# Patient Record
Sex: Male | Born: 1945 | Race: White | Hispanic: No | Smoking: Former smoker
Health system: Southern US, Community
[De-identification: ages and names within clinical notes are randomized; demographics above are authoritative.]

## PROBLEM LIST (undated history)

## (undated) DIAGNOSIS — I708 Atherosclerosis of other arteries: Secondary | ICD-10-CM

## (undated) DIAGNOSIS — E785 Hyperlipidemia, unspecified: Secondary | ICD-10-CM

## (undated) DIAGNOSIS — F329 Major depressive disorder, single episode, unspecified: Secondary | ICD-10-CM

## (undated) DIAGNOSIS — F419 Anxiety disorder, unspecified: Secondary | ICD-10-CM

## (undated) DIAGNOSIS — F32A Depression, unspecified: Secondary | ICD-10-CM

## (undated) DIAGNOSIS — I2510 Atherosclerotic heart disease of native coronary artery without angina pectoris: Secondary | ICD-10-CM

## (undated) DIAGNOSIS — R011 Cardiac murmur, unspecified: Secondary | ICD-10-CM

## (undated) DIAGNOSIS — Z9289 Personal history of other medical treatment: Secondary | ICD-10-CM

## (undated) DIAGNOSIS — D473 Essential (hemorrhagic) thrombocythemia: Secondary | ICD-10-CM

## (undated) DIAGNOSIS — R911 Solitary pulmonary nodule: Secondary | ICD-10-CM

## (undated) DIAGNOSIS — N138 Other obstructive and reflux uropathy: Secondary | ICD-10-CM

## (undated) DIAGNOSIS — K219 Gastro-esophageal reflux disease without esophagitis: Secondary | ICD-10-CM

## (undated) DIAGNOSIS — E119 Type 2 diabetes mellitus without complications: Secondary | ICD-10-CM

## (undated) DIAGNOSIS — D649 Anemia, unspecified: Secondary | ICD-10-CM

## (undated) DIAGNOSIS — I700 Atherosclerosis of aorta: Secondary | ICD-10-CM

## (undated) DIAGNOSIS — M5030 Other cervical disc degeneration, unspecified cervical region: Secondary | ICD-10-CM

## (undated) DIAGNOSIS — I10 Essential (primary) hypertension: Secondary | ICD-10-CM

## (undated) DIAGNOSIS — N401 Enlarged prostate with lower urinary tract symptoms: Secondary | ICD-10-CM

## (undated) DIAGNOSIS — M47812 Spondylosis without myelopathy or radiculopathy, cervical region: Secondary | ICD-10-CM

## (undated) DIAGNOSIS — I4819 Other persistent atrial fibrillation: Secondary | ICD-10-CM

## (undated) HISTORY — DX: Other cervical disc degeneration, unspecified cervical region: M50.30

## (undated) HISTORY — DX: Gastro-esophageal reflux disease without esophagitis: K21.9

## (undated) HISTORY — DX: Other obstructive and reflux uropathy: N13.8

## (undated) HISTORY — DX: Depression, unspecified: F32.A

## (undated) HISTORY — DX: Essential (primary) hypertension: I10

## (undated) HISTORY — DX: Benign prostatic hyperplasia with lower urinary tract symptoms: N40.1

## (undated) HISTORY — DX: Cardiac murmur, unspecified: R01.1

## (undated) HISTORY — DX: Personal history of other medical treatment: Z92.89

## (undated) HISTORY — DX: Solitary pulmonary nodule: R91.1

## (undated) HISTORY — DX: Spondylosis without myelopathy or radiculopathy, cervical region: M47.812

## (undated) HISTORY — DX: Other persistent atrial fibrillation: I48.19

## (undated) HISTORY — DX: Major depressive disorder, single episode, unspecified: F32.9

## (undated) HISTORY — PX: EYE SURGERY: SHX253

## (undated) HISTORY — DX: Atherosclerosis of other arteries: I70.8

## (undated) HISTORY — PX: RETINAL LASER PROCEDURE: SHX2339

## (undated) HISTORY — DX: Type 2 diabetes mellitus without complications: E11.9

## (undated) HISTORY — DX: Anxiety disorder, unspecified: F41.9

## (undated) HISTORY — DX: Hyperlipidemia, unspecified: E78.5

## (undated) HISTORY — PX: CATARACT EXTRACTION: SUR2

## (undated) HISTORY — DX: Atherosclerosis of aorta: I70.0

## (undated) HISTORY — DX: Atherosclerotic heart disease of native coronary artery without angina pectoris: I25.10

## (undated) HISTORY — DX: Essential (hemorrhagic) thrombocythemia: D47.3

## (undated) HISTORY — DX: Anemia, unspecified: D64.9

---

## 2005-09-05 ENCOUNTER — Ambulatory Visit: Payer: Self-pay | Admitting: Internal Medicine

## 2006-08-31 ENCOUNTER — Ambulatory Visit: Payer: Self-pay | Admitting: Family Medicine

## 2007-01-05 ENCOUNTER — Ambulatory Visit: Payer: Self-pay | Admitting: Family Medicine

## 2007-05-09 ENCOUNTER — Emergency Department: Payer: Self-pay | Admitting: Emergency Medicine

## 2008-10-20 ENCOUNTER — Ambulatory Visit: Payer: Self-pay | Admitting: Unknown Physician Specialty

## 2010-07-30 ENCOUNTER — Ambulatory Visit: Payer: Self-pay | Admitting: Otolaryngology

## 2010-10-08 ENCOUNTER — Ambulatory Visit: Payer: Self-pay | Admitting: Neurology

## 2011-05-13 ENCOUNTER — Ambulatory Visit: Payer: Self-pay | Admitting: Family Medicine

## 2011-06-02 ENCOUNTER — Ambulatory Visit: Payer: Self-pay | Admitting: Family Medicine

## 2011-06-02 ENCOUNTER — Ambulatory Visit: Payer: Self-pay | Admitting: Oncology

## 2011-06-02 LAB — IRON AND TIBC
Iron Saturation: 29 %
Iron: 126 ug/dL (ref 65–175)
Unbound Iron-Bind.Cap.: 305 ug/dL

## 2011-06-02 LAB — CBC CANCER CENTER
Basophil #: 0.1 x10 3/mm (ref 0.0–0.1)
Basophil %: 0.7 %
Eosinophil #: 0.1 x10 3/mm (ref 0.0–0.7)
HGB: 11.8 g/dL — ABNORMAL LOW (ref 13.0–18.0)
Lymphocyte %: 21.2 %
Lymphocytes: 18 %
MCV: 90 fL (ref 80–100)
Monocyte #: 0.7 x10 3/mm (ref 0.2–1.0)
Neutrophil #: 5.8 x10 3/mm (ref 1.4–6.5)
RBC: 4.03 10*6/uL — ABNORMAL LOW (ref 4.40–5.90)
RDW: 14 % (ref 11.5–14.5)
WBC: 8.4 x10 3/mm (ref 3.8–10.6)

## 2011-06-02 LAB — SEDIMENTATION RATE: Erythrocyte Sed Rate: 7 mm/hr (ref 0–20)

## 2011-06-02 LAB — RETICULOCYTES
Absolute Retic Count: 0.0311 10*6/uL (ref 0.024–0.084)
Reticulocyte: 0.77 % (ref 0.5–1.5)

## 2011-06-02 LAB — FERRITIN: Ferritin (ARMC): 13 ng/mL (ref 8–388)

## 2011-06-02 LAB — LACTATE DEHYDROGENASE: LDH: 137 U/L (ref 87–241)

## 2011-06-02 LAB — FOLATE: Folic Acid: 13.5 ng/mL (ref 3.1–100.0)

## 2011-06-18 ENCOUNTER — Ambulatory Visit: Payer: Self-pay | Admitting: Oncology

## 2011-08-18 ENCOUNTER — Ambulatory Visit: Payer: Self-pay | Admitting: Internal Medicine

## 2011-08-18 LAB — CBC CANCER CENTER
Basophil %: 0.4 %
Lymphocyte #: 1.2 x10 3/mm (ref 1.0–3.6)
MCHC: 32.5 g/dL (ref 32.0–36.0)
MCV: 91 fL (ref 80–100)
Monocyte #: 0.6 x10 3/mm (ref 0.2–1.0)
Monocyte %: 4.1 %
Neutrophil %: 87 %
Platelet: 558 x10 3/mm — ABNORMAL HIGH (ref 150–440)
RBC: 3.82 10*6/uL — ABNORMAL LOW (ref 4.40–5.90)
RDW: 13.8 % (ref 11.5–14.5)
WBC: 14.7 x10 3/mm — ABNORMAL HIGH (ref 3.8–10.6)

## 2011-09-15 LAB — CBC CANCER CENTER
Basophil %: 0.5 %
Eosinophil #: 0.2 x10 3/mm (ref 0.0–0.7)
Eosinophil %: 1.9 %
HGB: 11.4 g/dL — ABNORMAL LOW (ref 13.0–18.0)
Lymphocyte #: 1.6 x10 3/mm (ref 1.0–3.6)
MCH: 30.4 pg (ref 26.0–34.0)
MCHC: 34 g/dL (ref 32.0–36.0)
MCV: 90 fL (ref 80–100)
Monocyte #: 0.6 x10 3/mm (ref 0.2–1.0)
Neutrophil #: 6.4 x10 3/mm (ref 1.4–6.5)
Platelet: 516 x10 3/mm — ABNORMAL HIGH (ref 150–440)
RBC: 3.74 10*6/uL — ABNORMAL LOW (ref 4.40–5.90)

## 2011-09-18 ENCOUNTER — Ambulatory Visit: Payer: Self-pay | Admitting: Internal Medicine

## 2011-10-29 ENCOUNTER — Ambulatory Visit: Payer: Self-pay | Admitting: Neurology

## 2011-11-25 ENCOUNTER — Encounter: Payer: Self-pay | Admitting: Otolaryngology

## 2011-12-17 ENCOUNTER — Ambulatory Visit: Payer: Self-pay | Admitting: Internal Medicine

## 2011-12-17 LAB — CBC CANCER CENTER
Basophil #: 0.1 x10 3/mm (ref 0.0–0.1)
HGB: 10.5 g/dL — ABNORMAL LOW (ref 13.0–18.0)
Lymphocyte #: 1.3 x10 3/mm (ref 1.0–3.6)
Lymphocyte %: 17.7 %
MCH: 29 pg (ref 26.0–34.0)
MCHC: 32.3 g/dL (ref 32.0–36.0)
MCV: 90 fL (ref 80–100)
Monocyte %: 7.5 %
Neutrophil %: 71.3 %
Platelet: 486 x10 3/mm — ABNORMAL HIGH (ref 150–440)
RBC: 3.61 10*6/uL — ABNORMAL LOW (ref 4.40–5.90)
RDW: 14.3 % (ref 11.5–14.5)

## 2011-12-17 LAB — IRON AND TIBC: Unbound Iron-Bind.Cap.: 335 ug/dL

## 2011-12-17 LAB — FERRITIN: Ferritin (ARMC): 7 ng/mL — ABNORMAL LOW (ref 8–388)

## 2011-12-19 ENCOUNTER — Ambulatory Visit: Payer: Self-pay | Admitting: Internal Medicine

## 2011-12-19 ENCOUNTER — Encounter: Payer: Self-pay | Admitting: Otolaryngology

## 2012-01-18 ENCOUNTER — Encounter: Payer: Self-pay | Admitting: Otolaryngology

## 2012-02-18 ENCOUNTER — Ambulatory Visit: Payer: Self-pay | Admitting: Internal Medicine

## 2012-02-18 ENCOUNTER — Encounter: Payer: Self-pay | Admitting: Otolaryngology

## 2012-03-19 LAB — CBC CANCER CENTER
Basophil #: 0 x10 3/mm (ref 0.0–0.1)
HCT: 36.3 % — ABNORMAL LOW (ref 40.0–52.0)
HGB: 11.9 g/dL — ABNORMAL LOW (ref 13.0–18.0)
MCV: 89 fL (ref 80–100)
Monocyte #: 0.4 x10 3/mm (ref 0.2–1.0)
Monocyte %: 5.1 %
Platelet: 617 x10 3/mm — ABNORMAL HIGH (ref 150–440)
RDW: 14.3 % (ref 11.5–14.5)
WBC: 8.3 x10 3/mm (ref 3.8–10.6)

## 2012-03-19 LAB — IRON AND TIBC: Iron Bind.Cap.(Total): 362 ug/dL (ref 250–450)

## 2012-03-19 LAB — FERRITIN: Ferritin (ARMC): 20 ng/mL (ref 8–388)

## 2012-03-20 ENCOUNTER — Ambulatory Visit: Payer: Self-pay | Admitting: Internal Medicine

## 2012-04-17 ENCOUNTER — Ambulatory Visit: Payer: Self-pay | Admitting: Internal Medicine

## 2012-04-21 LAB — CBC CANCER CENTER
Basophil #: 0.1 x10 3/mm (ref 0.0–0.1)
Eosinophil %: 1 %
HCT: 34.7 % — ABNORMAL LOW (ref 40.0–52.0)
HGB: 11.6 g/dL — ABNORMAL LOW (ref 13.0–18.0)
Lymphocyte #: 1.4 x10 3/mm (ref 1.0–3.6)
MCH: 29.8 pg (ref 26.0–34.0)
MCV: 89 fL (ref 80–100)
Monocyte #: 0.5 x10 3/mm (ref 0.2–1.0)
Monocyte %: 7.5 %
Platelet: 510 x10 3/mm — ABNORMAL HIGH (ref 150–440)
RBC: 3.91 10*6/uL — ABNORMAL LOW (ref 4.40–5.90)
RDW: 14.1 % (ref 11.5–14.5)

## 2012-05-18 ENCOUNTER — Ambulatory Visit: Payer: Self-pay | Admitting: Internal Medicine

## 2012-06-16 LAB — RETICULOCYTES: Absolute Retic Count: 0.0297 10*6/uL

## 2012-06-16 LAB — CBC CANCER CENTER
Basophil %: 1 %
Eosinophil #: 0.1 x10 3/mm (ref 0.0–0.7)
HGB: 10.9 g/dL — ABNORMAL LOW (ref 13.0–18.0)
Lymphocyte #: 1.4 x10 3/mm (ref 1.0–3.6)
MCH: 29.5 pg (ref 26.0–34.0)
MCV: 90 fL (ref 80–100)
Monocyte %: 8 %
Neutrophil #: 3.8 x10 3/mm (ref 1.4–6.5)
RDW: 13.6 % (ref 11.5–14.5)
WBC: 5.8 x10 3/mm (ref 3.8–10.6)

## 2012-06-16 LAB — IRON AND TIBC
Iron: 64 ug/dL — ABNORMAL LOW (ref 65–175)
Unbound Iron-Bind.Cap.: 279 ug/dL

## 2012-06-17 ENCOUNTER — Ambulatory Visit: Payer: Self-pay | Admitting: Internal Medicine

## 2012-07-18 ENCOUNTER — Ambulatory Visit: Payer: Self-pay | Admitting: Internal Medicine

## 2012-08-09 ENCOUNTER — Ambulatory Visit: Payer: Self-pay | Admitting: Gastroenterology

## 2012-08-11 LAB — PATHOLOGY REPORT

## 2012-08-27 ENCOUNTER — Ambulatory Visit: Payer: Self-pay | Admitting: Gastroenterology

## 2012-08-31 ENCOUNTER — Ambulatory Visit: Payer: Self-pay | Admitting: Gastroenterology

## 2012-10-19 ENCOUNTER — Ambulatory Visit: Payer: Self-pay | Admitting: Internal Medicine

## 2012-10-19 LAB — CBC CANCER CENTER
Basophil #: 0.1 x10 3/mm (ref 0.0–0.1)
Basophil %: 0.7 %
HCT: 34.4 % — ABNORMAL LOW (ref 40.0–52.0)
HGB: 11.5 g/dL — ABNORMAL LOW (ref 13.0–18.0)
Lymphocyte #: 1.5 x10 3/mm (ref 1.0–3.6)
Lymphocyte %: 17.7 %
MCH: 30.4 pg (ref 26.0–34.0)
MCHC: 33.6 g/dL (ref 32.0–36.0)
Monocyte %: 5.9 %
Neutrophil #: 6.4 x10 3/mm (ref 1.4–6.5)
Platelet: 486 x10 3/mm — ABNORMAL HIGH (ref 150–440)
RDW: 13.1 % (ref 11.5–14.5)
WBC: 8.5 x10 3/mm (ref 3.8–10.6)

## 2012-10-19 LAB — FERRITIN: Ferritin (ARMC): 67 ng/mL (ref 8–388)

## 2012-11-17 ENCOUNTER — Ambulatory Visit: Payer: Self-pay | Admitting: Internal Medicine

## 2012-12-06 ENCOUNTER — Ambulatory Visit: Payer: Self-pay | Admitting: Gastroenterology

## 2012-12-21 ENCOUNTER — Ambulatory Visit: Payer: Self-pay | Admitting: Gastroenterology

## 2013-01-18 ENCOUNTER — Ambulatory Visit: Payer: Self-pay | Admitting: Internal Medicine

## 2013-01-18 LAB — CBC CANCER CENTER
Basophil #: 0 x10 3/mm (ref 0.0–0.1)
Basophil %: 0.8 %
Eosinophil #: 0 x10 3/mm (ref 0.0–0.7)
Eosinophil %: 0.6 %
HGB: 11.9 g/dL — ABNORMAL LOW (ref 13.0–18.0)
Lymphocyte %: 21.2 %
MCH: 30.6 pg (ref 26.0–34.0)
MCV: 90 fL (ref 80–100)
Neutrophil #: 4.3 x10 3/mm (ref 1.4–6.5)
Neutrophil %: 70.5 %
RDW: 13 % (ref 11.5–14.5)
WBC: 6.1 x10 3/mm (ref 3.8–10.6)

## 2013-01-18 LAB — FERRITIN: Ferritin (ARMC): 103 ng/mL (ref 8–388)

## 2013-01-18 LAB — IRON AND TIBC
Iron Bind.Cap.(Total): 315 ug/dL (ref 250–450)
Unbound Iron-Bind.Cap.: 216 ug/dL

## 2013-02-17 ENCOUNTER — Ambulatory Visit: Payer: Self-pay | Admitting: Internal Medicine

## 2013-04-12 ENCOUNTER — Ambulatory Visit: Payer: Self-pay | Admitting: Internal Medicine

## 2013-04-13 LAB — CBC CANCER CENTER
BASOS ABS: 0 x10 3/mm (ref 0.0–0.1)
Basophil %: 0.5 %
EOS ABS: 0 x10 3/mm (ref 0.0–0.7)
EOS PCT: 0.4 %
HCT: 36.4 % — ABNORMAL LOW (ref 40.0–52.0)
HGB: 11.9 g/dL — AB (ref 13.0–18.0)
LYMPHS ABS: 1.2 x10 3/mm (ref 1.0–3.6)
Lymphocyte %: 18.4 %
MCH: 29.6 pg (ref 26.0–34.0)
MCHC: 32.6 g/dL (ref 32.0–36.0)
MCV: 91 fL (ref 80–100)
Monocyte #: 0.3 x10 3/mm (ref 0.2–1.0)
Monocyte %: 5.1 %
Neutrophil #: 4.8 x10 3/mm (ref 1.4–6.5)
Neutrophil %: 75.6 %
PLATELETS: 510 x10 3/mm — AB (ref 150–440)
RBC: 4.01 10*6/uL — ABNORMAL LOW (ref 4.40–5.90)
RDW: 13.1 % (ref 11.5–14.5)
WBC: 6.3 x10 3/mm (ref 3.8–10.6)

## 2013-04-13 LAB — FERRITIN: Ferritin (ARMC): 122 ng/mL (ref 8–388)

## 2013-04-13 LAB — IRON AND TIBC
IRON: 69 ug/dL (ref 65–175)
Iron Bind.Cap.(Total): 328 ug/dL (ref 250–450)
Iron Saturation: 21 %
UNBOUND IRON-BIND. CAP.: 259 ug/dL

## 2013-04-17 ENCOUNTER — Ambulatory Visit: Payer: Self-pay | Admitting: Internal Medicine

## 2013-08-03 ENCOUNTER — Ambulatory Visit: Payer: Self-pay | Admitting: Internal Medicine

## 2013-08-03 LAB — CBC CANCER CENTER
BASOS PCT: 0.7 %
Basophil #: 0 x10 3/mm (ref 0.0–0.1)
EOS PCT: 1 %
Eosinophil #: 0.1 x10 3/mm (ref 0.0–0.7)
HCT: 35.7 % — AB (ref 40.0–52.0)
HGB: 11.9 g/dL — ABNORMAL LOW (ref 13.0–18.0)
Lymphocyte #: 1.7 x10 3/mm (ref 1.0–3.6)
Lymphocyte %: 24.3 %
MCH: 30.1 pg (ref 26.0–34.0)
MCHC: 33.3 g/dL (ref 32.0–36.0)
MCV: 91 fL (ref 80–100)
MONO ABS: 0.4 x10 3/mm (ref 0.2–1.0)
Monocyte %: 5.9 %
NEUTROS ABS: 4.8 x10 3/mm (ref 1.4–6.5)
NEUTROS PCT: 68.1 %
PLATELETS: 503 x10 3/mm — AB (ref 150–440)
RBC: 3.95 10*6/uL — AB (ref 4.40–5.90)
RDW: 12.9 % (ref 11.5–14.5)
WBC: 7.1 x10 3/mm (ref 3.8–10.6)

## 2013-08-03 LAB — IRON AND TIBC
IRON BIND. CAP.(TOTAL): 325 ug/dL (ref 250–450)
Iron Saturation: 22 %
Iron: 73 ug/dL (ref 65–175)
Unbound Iron-Bind.Cap.: 252 ug/dL

## 2013-08-03 LAB — FERRITIN: Ferritin (ARMC): 122 ng/mL (ref 8–388)

## 2013-08-17 ENCOUNTER — Ambulatory Visit: Payer: Self-pay | Admitting: Internal Medicine

## 2013-09-12 LAB — IRON AND TIBC
IRON BIND. CAP.(TOTAL): 319 ug/dL (ref 250–450)
Iron Saturation: 26 %
Iron: 82 ug/dL (ref 65–175)
Unbound Iron-Bind.Cap.: 237 ug/dL

## 2013-09-12 LAB — CBC CANCER CENTER
Basophil #: 0 x10 3/mm (ref 0.0–0.1)
Basophil %: 0.6 %
Eosinophil #: 0.1 x10 3/mm (ref 0.0–0.7)
Eosinophil %: 1.8 %
HCT: 35.5 % — ABNORMAL LOW (ref 40.0–52.0)
HGB: 11.8 g/dL — AB (ref 13.0–18.0)
LYMPHS PCT: 30.4 %
Lymphocyte #: 2.1 x10 3/mm (ref 1.0–3.6)
MCH: 29.8 pg (ref 26.0–34.0)
MCHC: 33.2 g/dL (ref 32.0–36.0)
MCV: 90 fL (ref 80–100)
MONO ABS: 0.6 x10 3/mm (ref 0.2–1.0)
Monocyte %: 8.4 %
NEUTROS ABS: 4 x10 3/mm (ref 1.4–6.5)
Neutrophil %: 58.8 %
Platelet: 474 x10 3/mm — ABNORMAL HIGH (ref 150–440)
RBC: 3.95 10*6/uL — ABNORMAL LOW (ref 4.40–5.90)
RDW: 13.1 % (ref 11.5–14.5)
WBC: 6.8 x10 3/mm (ref 3.8–10.6)

## 2013-09-12 LAB — FERRITIN: Ferritin (ARMC): 110 ng/mL (ref 8–388)

## 2013-09-17 ENCOUNTER — Ambulatory Visit: Payer: Self-pay | Admitting: Internal Medicine

## 2013-11-24 ENCOUNTER — Ambulatory Visit: Payer: Self-pay | Admitting: Ophthalmology

## 2013-11-24 DIAGNOSIS — Z01810 Encounter for preprocedural cardiovascular examination: Secondary | ICD-10-CM

## 2013-11-24 DIAGNOSIS — I10 Essential (primary) hypertension: Secondary | ICD-10-CM

## 2013-12-07 ENCOUNTER — Ambulatory Visit: Payer: Self-pay | Admitting: Internal Medicine

## 2013-12-07 LAB — IRON AND TIBC
Iron Bind.Cap.(Total): 302 ug/dL (ref 250–450)
Iron Saturation: 29 %
Iron: 87 ug/dL (ref 65–175)
UNBOUND IRON-BIND. CAP.: 215 ug/dL

## 2013-12-07 LAB — CBC CANCER CENTER
Basophil #: 0.1 x10 3/mm (ref 0.0–0.1)
Basophil %: 1 %
EOS PCT: 1.7 %
Eosinophil #: 0.1 x10 3/mm (ref 0.0–0.7)
HCT: 37 % — ABNORMAL LOW (ref 40.0–52.0)
HGB: 12 g/dL — AB (ref 13.0–18.0)
LYMPHS ABS: 1.9 x10 3/mm (ref 1.0–3.6)
LYMPHS PCT: 33.9 %
MCH: 29.4 pg (ref 26.0–34.0)
MCHC: 32.5 g/dL (ref 32.0–36.0)
MCV: 90 fL (ref 80–100)
Monocyte #: 0.4 x10 3/mm (ref 0.2–1.0)
Monocyte %: 7.4 %
Neutrophil #: 3.1 x10 3/mm (ref 1.4–6.5)
Neutrophil %: 56 %
PLATELETS: 501 x10 3/mm — AB (ref 150–440)
RBC: 4.1 10*6/uL — ABNORMAL LOW (ref 4.40–5.90)
RDW: 12.9 % (ref 11.5–14.5)
WBC: 5.6 x10 3/mm (ref 3.8–10.6)

## 2013-12-07 LAB — FERRITIN: FERRITIN (ARMC): 114 ng/mL (ref 8–388)

## 2013-12-18 ENCOUNTER — Ambulatory Visit: Payer: Self-pay | Admitting: Internal Medicine

## 2013-12-20 ENCOUNTER — Ambulatory Visit: Payer: Self-pay | Admitting: Ophthalmology

## 2014-02-24 DIAGNOSIS — H35351 Cystoid macular degeneration, right eye: Secondary | ICD-10-CM | POA: Diagnosis not present

## 2014-03-15 ENCOUNTER — Ambulatory Visit: Payer: Self-pay | Admitting: Internal Medicine

## 2014-03-15 DIAGNOSIS — E119 Type 2 diabetes mellitus without complications: Secondary | ICD-10-CM | POA: Diagnosis not present

## 2014-03-15 DIAGNOSIS — K219 Gastro-esophageal reflux disease without esophagitis: Secondary | ICD-10-CM | POA: Diagnosis not present

## 2014-03-15 DIAGNOSIS — D473 Essential (hemorrhagic) thrombocythemia: Secondary | ICD-10-CM | POA: Diagnosis not present

## 2014-03-15 DIAGNOSIS — Z79899 Other long term (current) drug therapy: Secondary | ICD-10-CM | POA: Diagnosis not present

## 2014-03-15 DIAGNOSIS — D649 Anemia, unspecified: Secondary | ICD-10-CM | POA: Diagnosis not present

## 2014-03-15 DIAGNOSIS — I10 Essential (primary) hypertension: Secondary | ICD-10-CM | POA: Diagnosis not present

## 2014-03-15 LAB — CBC CANCER CENTER
BASOS PCT: 1.1 %
Basophil #: 0.1 x10 3/mm (ref 0.0–0.1)
EOS PCT: 2.8 %
Eosinophil #: 0.2 x10 3/mm (ref 0.0–0.7)
HCT: 35.8 % — ABNORMAL LOW (ref 40.0–52.0)
HGB: 11.5 g/dL — AB (ref 13.0–18.0)
LYMPHS ABS: 1.7 x10 3/mm (ref 1.0–3.6)
Lymphocyte %: 31.2 %
MCH: 28.9 pg (ref 26.0–34.0)
MCHC: 32.2 g/dL (ref 32.0–36.0)
MCV: 90 fL (ref 80–100)
MONO ABS: 0.4 x10 3/mm (ref 0.2–1.0)
Monocyte %: 6.8 %
Neutrophil #: 3.2 x10 3/mm (ref 1.4–6.5)
Neutrophil %: 58.1 %
Platelet: 480 x10 3/mm — ABNORMAL HIGH (ref 150–440)
RBC: 3.98 10*6/uL — AB (ref 4.40–5.90)
RDW: 13.4 % (ref 11.5–14.5)
WBC: 5.5 x10 3/mm (ref 3.8–10.6)

## 2014-03-15 LAB — FERRITIN: FERRITIN (ARMC): 94 ng/mL (ref 8–388)

## 2014-03-15 LAB — IRON AND TIBC
Iron Bind.Cap.(Total): 305 ug/dL (ref 250–450)
Iron Saturation: 27 %
Iron: 81 ug/dL (ref 65–175)
Unbound Iron-Bind.Cap.: 224 ug/dL

## 2014-03-20 ENCOUNTER — Ambulatory Visit: Payer: Self-pay | Admitting: Internal Medicine

## 2014-03-27 DIAGNOSIS — H35351 Cystoid macular degeneration, right eye: Secondary | ICD-10-CM | POA: Diagnosis not present

## 2014-04-04 DIAGNOSIS — R4189 Other symptoms and signs involving cognitive functions and awareness: Secondary | ICD-10-CM | POA: Diagnosis not present

## 2014-05-15 DIAGNOSIS — H35351 Cystoid macular degeneration, right eye: Secondary | ICD-10-CM | POA: Diagnosis not present

## 2014-05-15 DIAGNOSIS — H34831 Tributary (branch) retinal vein occlusion, right eye: Secondary | ICD-10-CM | POA: Diagnosis not present

## 2014-06-10 NOTE — Op Note (Signed)
PATIENT NAME:  Garrett Yoder, Garrett Yoder MR#:  [id number] DATE OF BIRTH:  23-Jan-1946  DATE OF PROCEDURE:  12/20/2013  PREOPERATIVE DIAGNOSIS:  Nuclear sclerotic cataract of the right eye.   POSTOPERATIVE DIAGNOSIS:  Nuclear sclerotic cataract of the right eye.   OPERATIVE PROCEDURE:  Cataract extraction by phacoemulsification with implant of intraocular lens to right eye.   SURGEON:  Birder Robson, MD.   ANESTHESIA:  1. Managed anesthesia care.  2. Topical tetracaine drops followed by 2% Xylocaine jelly applied in the preoperative holding area.   COMPLICATIONS:  None.   TECHNIQUE:   Stop and chop.   DESCRIPTION OF PROCEDURE:  The patient was examined and consented in the preoperative holding area where the aforementioned topical anesthesia was applied to the right eye and then brought back to the Operating Room where the right eye was prepped and draped in the usual sterile ophthalmic fashion and a lid speculum was placed. A paracentesis was created with the side port blade and the anterior chamber was filled with viscoelastic. A near clear corneal incision was performed with the steel keratome. A continuous curvilinear capsulorrhexis was performed with a cystotome followed by the capsulorrhexis forceps. Hydrodissection and hydrodelineation were carried out with BSS on a blunt cannula. The lens was removed in a stop and chop technique and the remaining cortical material was removed with the irrigation-aspiration handpiece. The capsular bag was inflated with viscoelastic and the Tecnis ZCB00 18.5-diopter lens, serial number [id number] was placed in the capsular bag without complication. The remaining viscoelastic was removed from the eye with the irrigation-aspiration handpiece. The wounds were hydrated. The anterior chamber was flushed with Miostat and the eye was inflated to physiologic pressure. 0.1 mL of cefuroxime concentration 10 mg/mL was placed in the anterior chamber. The wounds were found to be  water tight. The eye was dressed with Vigamox. The patient was given protective glasses to wear throughout the day and a shield with which to sleep tonight. The patient was also given drops with which to begin a drop regimen today and will follow-up with me in one day.    ____________________________ Livingston Diones. Marga Gramajo, MD wlp:JT D: 12/20/2013 21:11:05 ET T: 12/21/2013 09:33:48 ET JOB#: [id number]  cc: Rossanna Spitzley L. Zaydn Gutridge, MD, <Dictator> Livingston Diones Rafia Shedden MD ELECTRONICALLY SIGNED 12/22/2013 16:57

## 2014-06-30 DIAGNOSIS — H34831 Tributary (branch) retinal vein occlusion, right eye: Secondary | ICD-10-CM | POA: Diagnosis not present

## 2014-06-30 DIAGNOSIS — H35351 Cystoid macular degeneration, right eye: Secondary | ICD-10-CM | POA: Diagnosis not present

## 2014-07-18 ENCOUNTER — Telehealth: Payer: Self-pay | Admitting: Family Medicine

## 2014-07-18 DIAGNOSIS — E119 Type 2 diabetes mellitus without complications: Secondary | ICD-10-CM | POA: Diagnosis not present

## 2014-07-18 DIAGNOSIS — I10 Essential (primary) hypertension: Secondary | ICD-10-CM | POA: Diagnosis not present

## 2014-07-18 DIAGNOSIS — E46 Unspecified protein-calorie malnutrition: Secondary | ICD-10-CM | POA: Diagnosis not present

## 2014-07-18 DIAGNOSIS — D509 Iron deficiency anemia, unspecified: Secondary | ICD-10-CM | POA: Diagnosis not present

## 2014-07-18 DIAGNOSIS — E780 Pure hypercholesterolemia: Secondary | ICD-10-CM | POA: Diagnosis not present

## 2014-07-19 NOTE — Telephone Encounter (Signed)
test

## 2014-08-07 DIAGNOSIS — H34831 Tributary (branch) retinal vein occlusion, right eye: Secondary | ICD-10-CM | POA: Diagnosis not present

## 2014-09-04 DIAGNOSIS — H34831 Tributary (branch) retinal vein occlusion, right eye: Secondary | ICD-10-CM | POA: Diagnosis not present

## 2014-10-11 ENCOUNTER — Telehealth: Payer: Self-pay | Admitting: Family Medicine

## 2014-10-11 NOTE — Telephone Encounter (Signed)
Pt is requesting a refill on Metformin (almost out) and Irbesartan (completely out). Please send to [facility]. Last seen on 07-18-14 but has his next appointment scheduled for oct. Please call once this has been completed and it is okay to leave a message with [name]).

## 2014-10-12 MED ORDER — METFORMIN HCL 1000 MG PO TABS: 1000.0000 mg | ORAL_TABLET | Freq: Two times a day (BID) | ORAL | Status: DC

## 2014-10-12 MED ORDER — IRBESARTAN 150 MG PO TABS: 150.0000 mg | ORAL_TABLET | Freq: Every day | ORAL | Status: DC

## 2014-10-12 NOTE — Telephone Encounter (Signed)
[facility] for [facility] meds [facility] with no refills. Pt informed

## 2014-10-18 DIAGNOSIS — H34831 Tributary (branch) retinal vein occlusion, right eye: Secondary | ICD-10-CM | POA: Diagnosis not present

## 2014-11-20 ENCOUNTER — Encounter: Payer: Self-pay | Admitting: Family Medicine

## 2014-11-20 ENCOUNTER — Ambulatory Visit (INDEPENDENT_AMBULATORY_CARE_PROVIDER_SITE_OTHER): Payer: Medicare Other | Admitting: Family Medicine

## 2014-11-20 VITALS — BP 118/72 | HR 91 | Temp 98.8°F | Resp 18 | Ht 68.0 in | Wt 127.6 lb

## 2014-11-20 DIAGNOSIS — E785 Hyperlipidemia, unspecified: Secondary | ICD-10-CM

## 2014-11-20 DIAGNOSIS — F32A Depression, unspecified: Secondary | ICD-10-CM

## 2014-11-20 DIAGNOSIS — I10 Essential (primary) hypertension: Secondary | ICD-10-CM

## 2014-11-20 DIAGNOSIS — E11311 Type 2 diabetes mellitus with unspecified diabetic retinopathy with macular edema: Secondary | ICD-10-CM

## 2014-11-20 DIAGNOSIS — F329 Major depressive disorder, single episode, unspecified: Secondary | ICD-10-CM | POA: Diagnosis not present

## 2014-11-20 DIAGNOSIS — Z23 Encounter for immunization: Secondary | ICD-10-CM | POA: Diagnosis not present

## 2014-11-20 DIAGNOSIS — E1121 Type 2 diabetes mellitus with diabetic nephropathy: Secondary | ICD-10-CM

## 2014-11-20 LAB — POCT UA - MICROALBUMIN: Microalbumin Ur, POC: 50 mg/L

## 2014-11-20 LAB — POCT GLYCOSYLATED HEMOGLOBIN (HGB A1C): Hemoglobin A1C: 6.5

## 2014-11-20 LAB — GLUCOSE, POCT (MANUAL RESULT ENTRY): POC GLUCOSE: 95 mg/dL (ref 70–99)

## 2014-11-20 NOTE — Progress Notes (Signed)
Name: Garrett Yoder.   MRN: [id number]    DOB: [birth year]   Date:11/20/2014       Progress Note  Subjective  Chief Complaint  Chief Complaint  Patient presents with  . Hypertension    4 month follow up  . Diabetes  . Hyperlipidemia    HPI   Hypertension   Patient presents for follow-up of hypertension. It has been present for over over 5 years.  Patient states that there is compliance with medical regimen which consists of Avapro 150 mg a day and daily . There is no end organ disease. Cardiac risk factors include hypertension hyperlipidemia and diabetes.  Exercise consist of walking .  Diet consist of sodium restriction. .  Diabetes  Patient presents for follow-up of diabetes which is present for over 5 years. Is currently on a regimen of metformin 1000 mg twice a day . Patient states is compliant with their diet and exercise. There's been no hypoglycemic episodes and there no polyuria polydipsia polyphagia. His average fasting glucoses been in the low around average of 178  this .  past 14 daysThere is no end organ disease.  Last diabetic eye exam was earlier this year.   Last visit with dietitian was earlier this year. Last microalbumin was obtained today and was elevated at 50 . diabetic complications include retinopathy and nephropathy  Hyperlipidemia  Patient has a history of hyperlipidemia for over 5 simvastatin 40 mg daily at bedtime years.  Current medical regimen consist ofsimvastatin 40 mg daily at bedtime .  Compliance isgood walking .  Diet and exercise are currently fousually .  Risk factors for cardiovascular disease include hyperlipidemia, hypertension, diabetes and a relatively sedentary lifestyle .   There have been no side effects from the medication.    Depression  Past Medical History  Diagnosis Date  . Anxiety   . Depression   . Diabetes mellitus without complication ([place])   . Hyperlipidemia   . Hypertension   . GERD (gastroesophageal reflux disease)      Social History  Substance Use Topics  . Smoking status: Former Research scientist (life sciences)  . Smokeless tobacco: Never Used  . Alcohol Use: No     Current outpatient prescriptions:  .  omeprazole (PRILOSEC) 20 MG capsule, Take by mouth., Disp: , Rfl:  .  aspirin EC 81 MG tablet, Take by mouth., Disp: , Rfl:  .  Brinzolamide-Brimonidine 1-0.2 % SUSP, Apply to eye., Disp: , Rfl:  .  Insulin Detemir (LEVEMIR FLEXPEN) 100 UNIT/ML Pen, Inject 30 Units into the skin., Disp: , Rfl:  .  irbesartan (AVAPRO) 150 MG tablet, Take by mouth., Disp: , Rfl:  .  IRON PO, Take by mouth., Disp: , Rfl:  .  metFORMIN (GLUCOPHAGE) 1000 MG tablet, , Disp: , Rfl:  .  mirtazapine (REMERON) 15 MG tablet, Take by mouth., Disp: , Rfl:  .  Multiple Vitamin (MULTI-VITAMINS) TABS, Take by mouth., Disp: , Rfl:  .  NOVOFINE 32G X 6 MM MISC, See admin instructions., Disp: , Rfl: 3 .  simvastatin (ZOCOR) 40 MG tablet, Take by mouth., Disp: , Rfl:  .  tamsulosin (FLOMAX) 0.4 MG CAPS capsule, Take by mouth., Disp: , Rfl:   Allergies  Allergen Reactions  . Codeine     Other reaction(s): Unknown    Review of Systems  Constitutional: Negative for fever, chills and weight loss.  HENT: Negative for congestion, hearing loss, sore throat and tinnitus.   Eyes: Positive for blurred vision. Negative for  double vision and redness.       Patient has diabetic retinopathy and is receiving monthly injections.  Respiratory: Negative for cough, hemoptysis and shortness of breath.   Cardiovascular: Negative for chest pain, palpitations, orthopnea, claudication and leg swelling.  Gastrointestinal: Negative for heartburn, nausea, vomiting, diarrhea, constipation and blood in stool.  Genitourinary: Negative for dysuria, urgency, frequency and hematuria.  Musculoskeletal: Negative for myalgias, back pain, joint pain, falls and neck pain.  Skin: Negative for itching.  Neurological: Negative for dizziness, tingling, tremors, focal weakness, seizures,  loss of consciousness, weakness and headaches.  Endo/Heme/Allergies: Does not bruise/bleed easily.  Psychiatric/Behavioral: Negative for depression and substance abuse. The patient is not nervous/anxious and does not have insomnia.      Objective  Filed Vitals:   11/20/14 0743  BP: 118/72  Pulse: 91  Temp: 98.8 F (37.1 C)  TempSrc: Oral  Resp: 18  Height: 5\' 8"  (1.727 m)  Weight: 127 lb 9.6 oz (57.879 kg)  SpO2: 98%     Physical Exam  Constitutional: He is oriented to person, place, and time and well-developed, well-nourished, and in no distress.  Slightly frail but in no acute distress  HENT:  Head: Normocephalic.  Eyes: EOM are normal. Pupils are equal, round, and reactive to light.  Neck: Normal range of motion. Neck supple. No thyromegaly present.  Cardiovascular: Normal rate, regular rhythm, normal heart sounds and intact distal pulses.   No murmur heard. Pulmonary/Chest: Effort normal and breath sounds normal. No respiratory distress. He has no wheezes.  Abdominal: Soft. Bowel sounds are normal.  Musculoskeletal: Normal range of motion. He exhibits no edema.  Lymphadenopathy:    He has no cervical adenopathy.  Neurological: He is alert and oriented to person, place, and time. No cranial nerve deficit. Gait normal. Coordination normal.  Skin: Skin is warm and dry. No rash noted.  Psychiatric: Affect and judgment normal.  Vitals reviewed.     Assessment & Plan   1. Diabetes mellitus with nephropathy (HCC) Stable  2. 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 Essential hypertension Well-controlled  4. Depression Well-controlled  5. Hyperlipidemia At goal  6. Need for influenza vaccination Given today - POCT HgB A1C - POCT Glucose (CBG) - POCT UA - Microalbumin - Flu vaccine HIGH DOSE PF (Fluzone High dose)

## 2014-11-27 DIAGNOSIS — H348111 Central retinal vein occlusion, right eye, with retinal neovascularization: Secondary | ICD-10-CM | POA: Diagnosis not present

## 2015-01-05 ENCOUNTER — Other Ambulatory Visit: Payer: Self-pay

## 2015-01-05 NOTE — Telephone Encounter (Signed)
Patient requesting refill. 

## 2015-01-07 ENCOUNTER — Other Ambulatory Visit: Payer: Self-pay | Admitting: Family Medicine

## 2015-01-08 MED ORDER — MIRTAZAPINE 15 MG PO TABS: 15.0000 mg | ORAL_TABLET | Freq: Every day | ORAL | Status: DC

## 2015-01-09 ENCOUNTER — Other Ambulatory Visit: Payer: Self-pay | Admitting: Family Medicine

## 2015-01-09 NOTE — Telephone Encounter (Signed)
Patient requesting refill. 

## 2015-01-15 DIAGNOSIS — H348111 Central retinal vein occlusion, right eye, with retinal neovascularization: Secondary | ICD-10-CM | POA: Diagnosis not present

## 2015-01-26 ENCOUNTER — Other Ambulatory Visit: Payer: Self-pay

## 2015-01-31 ENCOUNTER — Other Ambulatory Visit: Payer: Self-pay | Admitting: Family Medicine

## 2015-01-31 MED ORDER — NOVOFINE 32G X 6 MM MISC: 32.0000 "pen " | Status: DC

## 2015-02-07 ENCOUNTER — Other Ambulatory Visit: Payer: Self-pay

## 2015-02-25 ENCOUNTER — Other Ambulatory Visit: Payer: Self-pay | Admitting: Family Medicine

## 2015-03-09 DIAGNOSIS — H348111 Central retinal vein occlusion, right eye, with retinal neovascularization: Secondary | ICD-10-CM | POA: Diagnosis not present

## 2015-03-09 DIAGNOSIS — E119 Type 2 diabetes mellitus without complications: Secondary | ICD-10-CM | POA: Diagnosis not present

## 2015-03-22 ENCOUNTER — Ambulatory Visit: Payer: Medicare Other | Admitting: Family Medicine

## 2015-04-20 ENCOUNTER — Other Ambulatory Visit: Payer: Self-pay | Admitting: Family Medicine

## 2015-04-27 ENCOUNTER — Ambulatory Visit: Payer: Medicare Other | Admitting: Family Medicine

## 2015-05-04 DIAGNOSIS — H348111 Central retinal vein occlusion, right eye, with retinal neovascularization: Secondary | ICD-10-CM | POA: Diagnosis not present

## 2015-06-04 ENCOUNTER — Ambulatory Visit: Payer: Medicare Other | Admitting: Family Medicine

## 2015-06-13 ENCOUNTER — Other Ambulatory Visit: Payer: Self-pay | Admitting: Family Medicine

## 2015-06-15 ENCOUNTER — Other Ambulatory Visit: Payer: Self-pay | Admitting: Family Medicine

## 2015-07-03 ENCOUNTER — Other Ambulatory Visit: Payer: Self-pay | Admitting: Family Medicine

## 2015-07-09 DIAGNOSIS — H348111 Central retinal vein occlusion, right eye, with retinal neovascularization: Secondary | ICD-10-CM | POA: Diagnosis not present

## 2015-08-14 ENCOUNTER — Encounter: Payer: Self-pay | Admitting: Family Medicine

## 2015-08-14 ENCOUNTER — Ambulatory Visit (INDEPENDENT_AMBULATORY_CARE_PROVIDER_SITE_OTHER): Payer: Medicare Other | Admitting: Family Medicine

## 2015-08-14 VITALS — BP 138/50 | HR 97 | Temp 98.3°F | Resp 14 | Wt 129.0 lb

## 2015-08-14 DIAGNOSIS — Z794 Long term (current) use of insulin: Secondary | ICD-10-CM | POA: Diagnosis not present

## 2015-08-14 DIAGNOSIS — D75839 Thrombocytosis, unspecified: Secondary | ICD-10-CM

## 2015-08-14 DIAGNOSIS — E119 Type 2 diabetes mellitus without complications: Secondary | ICD-10-CM | POA: Diagnosis not present

## 2015-08-14 DIAGNOSIS — D473 Essential (hemorrhagic) thrombocythemia: Secondary | ICD-10-CM | POA: Diagnosis not present

## 2015-08-14 DIAGNOSIS — Z1211 Encounter for screening for malignant neoplasm of colon: Secondary | ICD-10-CM | POA: Diagnosis not present

## 2015-08-14 DIAGNOSIS — K219 Gastro-esophageal reflux disease without esophagitis: Secondary | ICD-10-CM | POA: Diagnosis not present

## 2015-08-14 DIAGNOSIS — I10 Essential (primary) hypertension: Secondary | ICD-10-CM

## 2015-08-14 DIAGNOSIS — E785 Hyperlipidemia, unspecified: Secondary | ICD-10-CM | POA: Insufficient documentation

## 2015-08-14 DIAGNOSIS — Z5181 Encounter for therapeutic drug level monitoring: Secondary | ICD-10-CM | POA: Insufficient documentation

## 2015-08-14 DIAGNOSIS — D649 Anemia, unspecified: Secondary | ICD-10-CM

## 2015-08-14 HISTORY — DX: Thrombocytosis, unspecified: D75.839

## 2015-08-14 HISTORY — DX: Anemia, unspecified: D64.9

## 2015-08-14 LAB — CBC WITH DIFFERENTIAL/PLATELET
Basophils Absolute: 65 cells/uL (ref 0–200)
Basophils Relative: 1 %
EOS ABS: 65 {cells}/uL (ref 15–500)
Eosinophils Relative: 1 %
HEMATOCRIT: 38.7 % (ref 38.5–50.0)
Hemoglobin: 12.9 g/dL — ABNORMAL LOW (ref 13.2–17.1)
LYMPHS PCT: 32 %
Lymphs Abs: 2080 cells/uL (ref 850–3900)
MCH: 29.7 pg (ref 27.0–33.0)
MCHC: 33.3 g/dL (ref 32.0–36.0)
MCV: 89.2 fL (ref 80.0–100.0)
MONO ABS: 455 {cells}/uL (ref 200–950)
MONOS PCT: 7 %
MPV: 8.9 fL (ref 7.5–12.5)
NEUTROS PCT: 59 %
Neutro Abs: 3835 cells/uL (ref 1500–7800)
PLATELETS: 525 10*3/uL — AB (ref 140–400)
RBC: 4.34 MIL/uL (ref 4.20–5.80)
RDW: 12.8 % (ref 11.0–15.0)
WBC: 6.5 10*3/uL (ref 3.8–10.8)

## 2015-08-14 MED ORDER — METFORMIN HCL 1000 MG PO TABS: 1000.0000 mg | ORAL_TABLET | Freq: Two times a day (BID) | ORAL | Status: DC

## 2015-08-14 MED ORDER — INSULIN DETEMIR 100 UNIT/ML FLEXPEN: 30.0000 [IU] | PEN_INJECTOR | Freq: Every morning | SUBCUTANEOUS | Status: DC

## 2015-08-14 MED ORDER — SIMVASTATIN 40 MG PO TABS: 40.0000 mg | ORAL_TABLET | Freq: Every day | ORAL | Status: DC

## 2015-08-14 MED ORDER — IRBESARTAN 150 MG PO TABS: ORAL_TABLET | ORAL | Status: DC

## 2015-08-14 NOTE — Patient Instructions (Addendum)
Try weaning down on the mirtazipine, take just half of a pill at bedtime for 2 weeks, then stop; call if mood worsens Try weaning down on the omeprazole If you need something for acid reflux, try Zantac Try to limit saturated fats in your diet (bologna, hot dogs, barbeque, cheeseburgers, hamburgers, steak, bacon, sausage, cheese, etc.) and get more fresh fruits, vegetables, and whole grains Please do see your eye doctor regularly, and have your eyes examined every year (or more often per his or her recommendation) Check your feet every night and let me know right away of any sores, infections, numbness, etc. Try to limit sweets, white bread, white rice, white potatoes Your goal blood pressure is less than 140 mmHg on top. Try to follow the DASH guidelines (DASH stands for Dietary Approaches to Stop Hypertension) Try to limit the sodium in your diet.  Ideally, consume less than 1.5 grams (less than 1,500mg ) per day. Do not add salt when cooking or at the table.  Check the sodium amount on labels when shopping, and choose items lower in sodium when given a choice. Avoid or limit foods that already contain a lot of sodium. Eat a diet rich in fruits and vegetables and whole grains.  Cholesterol Cholesterol is a white, waxy, fat-like substance needed by your body in small amounts. The liver makes all the cholesterol you need. Cholesterol is carried from the liver by the blood through the blood vessels. Deposits of cholesterol (plaque) may build up on blood vessel walls. These make the arteries narrower and stiffer. Cholesterol plaques increase the risk for heart attack and stroke.  You cannot feel your cholesterol level even if it is very high. The only way to know it is high is with a blood test. Once you know your cholesterol levels, you should keep a record of the test results. Work with your health care provider to keep your levels in the desired range.  WHAT DO THE RESULTS MEAN?  Total cholesterol  is a rough measure of all the cholesterol in your blood.   LDL is the so-called bad cholesterol. This is the type that deposits cholesterol in the walls of the arteries. You want this level to be low.   HDL is the good cholesterol because it cleans the arteries and carries the LDL away. You want this level to be high.  Triglycerides are fat that the body can either burn for energy or store. High levels are closely linked to heart disease.  WHAT ARE THE DESIRED LEVELS OF CHOLESTEROL?  Total cholesterol below 200.   LDL below 100 for people at risk, below 70 for those at very high risk.   HDL above 50 is good, above 60 is best.   Triglycerides below 150.  HOW CAN I LOWER MY CHOLESTEROL?  Diet. Follow your diet programs as directed by your health care provider.   Choose fish or white meat chicken and [place], roasted or baked. Limit fatty cuts of red meat, fried foods, and processed meats, such as sausage and lunch meats.   Eat lots of fresh fruits and vegetables.  Choose whole grains, beans, pasta, potatoes, and cereals.   Use only small amounts of olive, corn, or canola oils.   Avoid butter, mayonnaise, shortening, or palm kernel oils.  Avoid foods with trans fats.   Drink skim or nonfat milk and eat low-fat or nonfat yogurt and cheeses. Avoid whole milk, cream, ice cream, egg yolks, and full-fat cheeses.   Healthy desserts include angel  food cake, ginger snaps, animal crackers, hard candy, popsicles, and low-fat or nonfat frozen yogurt. Avoid pastries, cakes, pies, and cookies.   Exercise. Follow your exercise programs as directed by your health care provider.   A regular program helps decrease LDL and raise HDL.   A regular program helps with weight control.   Do things that increase your activity level like gardening, walking, or taking the stairs. Ask your health care provider about how you can be more active in your daily life.   Medicine. Take  medicine only as directed by your health care provider.   Medicine may be prescribed by your health care provider to help lower cholesterol and decrease the risk for heart disease.   If you have several risk factors, you may need medicine even if your levels are normal.   This information is not intended to replace advice given to you by your health care provider. Make sure you discuss any questions you have with your health care provider.   Document Released: 10/29/2000 Document Revised: 02/24/2014 Document Reviewed: 11/17/2012 Elsevier Interactive Patient Education 2016 [place] DASH stands for "Dietary Approaches to Stop Hypertension." The DASH eating plan is a healthy eating plan that has been shown to reduce high blood pressure (hypertension). Additional health benefits may include reducing the risk of type 2 diabetes mellitus, heart disease, and stroke. The DASH eating plan may also help with weight loss. WHAT DO I NEED TO KNOW ABOUT THE DASH EATING PLAN? For the DASH eating plan, you will follow these general guidelines:  Choose foods with a percent daily value for sodium of less than 5% (as listed on the food label).  Use salt-free seasonings or herbs instead of table salt or sea salt.  Check with your health care provider or pharmacist before using salt substitutes.  Eat lower-sodium products, often labeled as "lower sodium" or "no salt added."  Eat fresh foods.  Eat more vegetables, fruits, and low-fat dairy products.  Choose whole grains. Look for the word "whole" as the first word in the ingredient list.  Choose fish and skinless chicken or [place] more often than red meat. Limit fish, poultry, and meat to 6 oz (170 g) each day.  Limit sweets, desserts, sugars, and sugary drinks.  Choose heart-healthy fats.  Limit cheese to 1 oz (28 g) per day.  Eat more home-cooked food and less restaurant, buffet, and fast food.  Limit fried foods.  Cook  foods using methods other than frying.  Limit canned vegetables. If you do use them, rinse them well to decrease the sodium.  When eating at a restaurant, ask that your food be prepared with less salt, or no salt if possible. WHAT FOODS CAN I EAT? Seek help from a dietitian for individual calorie needs. Grains Whole grain or whole wheat bread. Brown rice. Whole grain or whole wheat pasta. Quinoa, bulgur, and whole grain cereals. Low-sodium cereals. Corn or whole wheat flour tortillas. Whole grain cornbread. Whole grain crackers. Low-sodium crackers. Vegetables Fresh or frozen vegetables (raw, steamed, roasted, or grilled). Low-sodium or reduced-sodium tomato and vegetable juices. Low-sodium or reduced-sodium tomato sauce and paste. Low-sodium or reduced-sodium canned vegetables.  Fruits All fresh, canned (in natural juice), or frozen fruits. Meat and Other Protein Products Ground beef (85% or leaner), grass-fed beef, or beef trimmed of fat. Skinless chicken or [place]. Ground chicken or [place]. Pork trimmed of fat. All fish and seafood. Eggs. Dried beans, peas, or lentils. Unsalted nuts and seeds.  Unsalted canned beans. Dairy Low-fat dairy products, such as skim or 1% milk, 2% or reduced-fat cheeses, low-fat ricotta or cottage cheese, or plain low-fat yogurt. Low-sodium or reduced-sodium cheeses. Fats and Oils Tub margarines without trans fats. Light or reduced-fat mayonnaise and salad dressings (reduced sodium). Avocado. Safflower, olive, or canola oils. Natural peanut or almond butter. Other Unsalted popcorn and pretzels. The items listed above may not be a complete list of recommended foods or beverages. Contact your dietitian for more options. WHAT FOODS ARE NOT RECOMMENDED? Grains White bread. White pasta. White rice. Refined cornbread. Bagels and croissants. Crackers that contain trans fat. Vegetables Creamed or fried vegetables. Vegetables in a cheese sauce. Regular canned vegetables.  Regular canned tomato sauce and paste. Regular tomato and vegetable juices. Fruits Dried fruits. Canned fruit in light or heavy syrup. Fruit juice. Meat and Other Protein Products Fatty cuts of meat. Ribs, chicken wings, bacon, sausage, bologna, salami, chitterlings, fatback, hot dogs, bratwurst, and packaged luncheon meats. Salted nuts and seeds. Canned beans with salt. Dairy Whole or 2% milk, cream, half-and-half, and cream cheese. Whole-fat or sweetened yogurt. Full-fat cheeses or blue cheese. Nondairy creamers and whipped toppings. Processed cheese, cheese spreads, or cheese curds. Condiments Onion and garlic salt, seasoned salt, table salt, and sea salt. Canned and packaged gravies. Worcestershire sauce. Tartar sauce. Barbecue sauce. Teriyaki sauce. Soy sauce, including reduced sodium. Steak sauce. Fish sauce. Oyster sauce. Cocktail sauce. Horseradish. Ketchup and mustard. Meat flavorings and tenderizers. Bouillon cubes. Hot sauce. Tabasco sauce. Marinades. Taco seasonings. Relishes. Fats and Oils Butter, stick margarine, lard, shortening, ghee, and bacon fat. Coconut, palm kernel, or palm oils. Regular salad dressings. Other Pickles and olives. Salted popcorn and pretzels. The items listed above may not be a complete list of foods and beverages to avoid. Contact your dietitian for more information. WHERE CAN I FIND MORE INFORMATION? National Heart, Lung, and Blood Institute: travelstabloid.com   This information is not intended to replace advice given to you by your health care provider. Make sure you discuss any questions you have with your health care provider.   Document Released: 01/23/2011 Document Revised: 02/24/2014 Document Reviewed: 12/08/2012 Elsevier Interactive Patient Education [employer].

## 2015-08-14 NOTE — Progress Notes (Signed)
BP (!) 138/50   Pulse 97   Temp 98.3 F (36.8 C) (Oral)   Resp 14   Wt 129 lb (58.5 kg)   SpO2 98%   BMI 19.61 kg/m    Subjective:    Patient ID: Garrett Spikes., male    DOB: 28-Dec-1945, 70 y.o.   MRN: [id number]  HPI: Garrett Loeber. is a 70 y.o. male  Chief Complaint  Patient presents with  . Medication Refill   Patient is new to me; his usual provider is out of the office  HTN; on irbesartan 150 mg daily; no problems with that medicine; not a big salt shaker user; uses pepper instead  Was seeing cancer center doctor for anemia; that problem has resolved Lab Results  Component Value Date   WBC 6.5 08/14/2015   HGB 12.9 (L) 08/14/2015   HCT 38.7 08/14/2015   MCV 89.2 08/14/2015   PLT 525 (H) 08/14/2015   Type 2 diabetes; since 2001; diabetes does not really run in the family; room for improvement with diet; last eye exam  Cataract surgery late 2015; eye did not heal properly; seeing retinal specialist, every 2 months; has to have injection regularly, does eye exam every time he goes; will see Dr. Chyrl Civatte short-term  Lab Results  Component Value Date   HGBA1C 6.4 (H) 08/14/2015   High cholesterol; room for improvement with his diet, he admits; no problems on the statin; taking for a long time  Taking mirtazipine for mood; he thinks he could get rid of it; no HI/SI Depression screen [facility] 2/9 08/14/2015 11/20/2014  Decreased Interest 0 0  Down, Depressed, Hopeless 0 0  PHQ - 2 Score 0 0   Tamsulosin, prostate and bladder working well; no recent visits to urologist; that doctor left town  Taking omeprazole, taking gastric  Relevant past medical, surgical, family and social history reviewed Past Medical History:  Diagnosis Date  . Anemia 08/14/2015  . Anxiety   . Depression   . Diabetes mellitus without complication ([place])   . GERD (gastroesophageal reflux disease)   . Hyperlipidemia   . Hypertension   . Thrombocytosis ([place]) 08/14/2015   Past  Surgical History:  Procedure Laterality Date  . CATARACT EXTRACTION    . RETINAL LASER PROCEDURE    colonoscopy x 2, Dr. Gustavo Lah History reviewed. No pertinent family history. Social History  Substance Use Topics  . Smoking status: Former Research scientist (life sciences)  . Smokeless tobacco: Never Used  . Alcohol use No   Interim medical history since last visit reviewed. Allergies and medications reviewed  Review of Systems Per HPI unless specifically indicated above     Objective:    BP (!) 138/50   Pulse 97   Temp 98.3 F (36.8 C) (Oral)   Resp 14   Wt 129 lb (58.5 kg)   SpO2 98%   BMI 19.61 kg/m   Wt Readings from Last 3 Encounters:  08/14/15 129 lb (58.5 kg)  11/20/14 127 lb 9.6 oz (57.9 kg)    Physical Exam  Constitutional: He appears well-developed and well-nourished. No distress.  HENT:  Head: Normocephalic and atraumatic.  Eyes: EOM are normal. No scleral icterus.  Neck: No thyromegaly present.  Cardiovascular: Normal rate and regular rhythm.   Pulmonary/Chest: Effort normal and breath sounds normal.  Abdominal: Soft. Bowel sounds are normal. He exhibits no distension.  Musculoskeletal: He exhibits no edema.  Neurological: He is alert.  Skin: Skin is warm and dry. No pallor.  Psychiatric: He has a normal mood and affect. His behavior is normal. Judgment and thought content normal.    Diabetic Foot Form - Detailed   Diabetic Foot Exam - detailed Diabetic Foot exam was performed with the following findings:  Yes   Visual Foot Exam completed.:  Yes  Are the toenails ingrown?:  No Normal Range of Motion:  Yes Pulse Foot Exam completed.:  Yes  Right Dorsalis Pedis:  Present Left Dorsalis Pedis:  Present  Sensory Foot Exam Completed.:  Yes Swelling:  No Semmes-Weinstein Monofilament Test R Site 1-Great Toe:  Pos L Site 1-Great Toe:  Pos  R Site 4:  Pos L Site 4:  Pos  R Site 5:  Pos L Site 5:  Pos          Assessment & Plan:   Problem List Items Addressed This Visit        Cardiovascular and Mediastinum   Essential hypertension, benign    DASH guidelines      Relevant Medications   irbesartan (AVAPRO) 150 MG tablet   simvastatin (ZOCOR) 40 MG tablet     Digestive   Acid reflux    Discussed risk of PPI; will have him avoid / limit triggers, wean medicine as able      Relevant Medications   Probiotic Product (PROBIOTIC ADVANCED PO)     Endocrine   Type 2 diabetes mellitus (HCC) - Primary    Foot exam by MD; check A1c; goal LDL under 100 at least, under 70 would be ideal, goal HDL over 40; goal TG under 150; healthy eating encouraged; keep appt with eye doctor      Relevant Medications   irbesartan (AVAPRO) 150 MG tablet   Insulin Detemir (LEVEMIR FLEXTOUCH) 100 UNIT/ML Pen   metFORMIN (GLUCOPHAGE) 1000 MG tablet   simvastatin (ZOCOR) 40 MG tablet   Other Relevant Orders   Hemoglobin A1c (Completed)     Hematopoietic and Hemostatic   Thrombocytosis (HCC)   Relevant Orders   CBC with Differential/Platelet (Completed)   Ferritin (Completed)     Other   Medication monitoring encounter   Relevant Orders   COMPLETE METABOLIC PANEL WITH GFR (Completed)   Hyperlipidemia   Relevant Medications   irbesartan (AVAPRO) 150 MG tablet   simvastatin (ZOCOR) 40 MG tablet   Other Relevant Orders   Lipid panel (Completed)   Colon cancer screening    Refer to GI      Relevant Orders   Ambulatory referral to Gastroenterology   Anemia    Managed by heme      Relevant Orders   CBC with Differential/Platelet (Completed)   Ferritin (Completed)    Other Visit Diagnoses   None.     Follow up plan: Return in about 4 months (around 12/14/2015) for fasting labs and visit with Dr. Rutherford Nail or Dr. Sanda Klein.  An after-visit summary was printed and given to the patient at [place].  Please see the patient instructions which may contain other information and recommendations beyond what is mentioned above in the assessment and plan.  Meds ordered this  encounter  Medications  . Probiotic Product (PROBIOTIC ADVANCED PO)    Sig: Take by mouth.  . irbesartan (AVAPRO) 150 MG tablet    Sig: Take 1 tablet by mouth  daily    Dispense:  90 tablet    Refill:  1  . Insulin Detemir (LEVEMIR FLEXTOUCH) 100 UNIT/ML Pen    Sig: Inject 30 Units into the skin every morning.  Dispense:  45 mL    Refill:  1  . metFORMIN (GLUCOPHAGE) 1000 MG tablet    Sig: Take 1 tablet (1,000 mg total) by mouth 2 (two) times daily with a meal.    Dispense:  180 tablet    Refill:  1  . mirtazapine (REMERON) 15 MG tablet    Sig: One-half of a pill by mouth at bedtime for 2 weeks, then stop  . simvastatin (ZOCOR) 40 MG tablet    Sig: Take 1 tablet (40 mg total) by mouth at bedtime.    Dispense:  90 tablet    Refill:  1    Orders Placed This Encounter  Procedures  . Hemoglobin A1c  . Lipid panel  . CBC with Differential/Platelet  . COMPLETE METABOLIC PANEL WITH GFR  . Ferritin  . Ambulatory referral to Gastroenterology

## 2015-08-15 LAB — LIPID PANEL
CHOL/HDL RATIO: 2.5 ratio (ref <=5.0)
CHOLESTEROL: 159 mg/dL (ref 125–200)
HDL: 63 mg/dL (ref >=40)
LDL Cholesterol: 74 mg/dL (ref <130)
Triglycerides: 109 mg/dL (ref <150)
VLDL: 22 mg/dL (ref <30)

## 2015-08-15 LAB — COMPLETE METABOLIC PANEL WITH GFR
ALT: 14 U/L (ref 9–46)
AST: 16 U/L (ref 10–35)
Albumin: 5.1 g/dL (ref 3.6–5.1)
Alkaline Phosphatase: 68 U/L (ref 40–115)
BUN: 21 mg/dL (ref 7–25)
CALCIUM: 10.1 mg/dL (ref 8.6–10.3)
CHLORIDE: 103 mmol/L (ref 98–110)
CO2: 24 mmol/L (ref 20–31)
Creat: 1.08 mg/dL (ref 0.70–1.25)
GFR, EST AFRICAN AMERICAN: 81 mL/min (ref >=60)
GFR, EST NON AFRICAN AMERICAN: 70 mL/min (ref >=60)
Glucose, Bld: 118 mg/dL — ABNORMAL HIGH (ref 65–99)
POTASSIUM: 4.1 mmol/L (ref 3.5–5.3)
Sodium: 142 mmol/L (ref 135–146)
Total Bilirubin: 0.3 mg/dL (ref 0.2–1.2)
Total Protein: 7.7 g/dL (ref 6.1–8.1)

## 2015-08-15 LAB — HEMOGLOBIN A1C
Hgb A1c MFr Bld: 6.4 % — ABNORMAL HIGH (ref <5.7)
MEAN PLASMA GLUCOSE: 137 mg/dL

## 2015-08-15 LAB — FERRITIN: Ferritin: 82 ng/mL (ref 20–380)

## 2015-09-03 DIAGNOSIS — H34811 Central retinal vein occlusion, right eye, with macular edema: Secondary | ICD-10-CM | POA: Diagnosis not present

## 2015-09-05 ENCOUNTER — Other Ambulatory Visit: Payer: Self-pay | Admitting: Family Medicine

## 2015-09-07 ENCOUNTER — Other Ambulatory Visit: Payer: Self-pay

## 2015-09-07 NOTE — Telephone Encounter (Signed)
June 2017 CBC reviewed; this medicine was supposed to be tapered off and stopped at last visit; declined

## 2015-09-09 DIAGNOSIS — K219 Gastro-esophageal reflux disease without esophagitis: Secondary | ICD-10-CM | POA: Insufficient documentation

## 2015-09-09 NOTE — Assessment & Plan Note (Signed)
Discussed risk of PPI; will have him avoid / limit triggers, wean medicine as able

## 2015-09-09 NOTE — Assessment & Plan Note (Signed)
Managed by heme

## 2015-09-09 NOTE — Assessment & Plan Note (Signed)
Refer to GI 

## 2015-09-09 NOTE — Assessment & Plan Note (Signed)
DASH guidelines 

## 2015-09-09 NOTE — Assessment & Plan Note (Signed)
Foot exam by MD; check A1c; goal LDL under 100 at least, under 70 would be ideal, goal HDL over 40; goal TG under 150; healthy eating encouraged; keep appt with eye doctor

## 2015-10-04 DIAGNOSIS — R110 Nausea: Secondary | ICD-10-CM | POA: Diagnosis not present

## 2015-10-05 ENCOUNTER — Emergency Department: Payer: Medicare Other

## 2015-10-05 ENCOUNTER — Observation Stay
Admission: EM | Admit: 2015-10-05 | Discharge: 2015-10-06 | Disposition: A | Discharge status: Home / Self Care | Payer: Medicare Other | Attending: Internal Medicine | Admitting: Internal Medicine

## 2015-10-05 DIAGNOSIS — R42 Dizziness and giddiness: Principal | ICD-10-CM

## 2015-10-05 DIAGNOSIS — E119 Type 2 diabetes mellitus without complications: Secondary | ICD-10-CM | POA: Insufficient documentation

## 2015-10-05 DIAGNOSIS — I10 Essential (primary) hypertension: Secondary | ICD-10-CM | POA: Insufficient documentation

## 2015-10-05 DIAGNOSIS — R1031 Right lower quadrant pain: Secondary | ICD-10-CM | POA: Diagnosis not present

## 2015-10-05 DIAGNOSIS — Z79899 Other long term (current) drug therapy: Secondary | ICD-10-CM | POA: Diagnosis not present

## 2015-10-05 DIAGNOSIS — R110 Nausea: Secondary | ICD-10-CM | POA: Diagnosis not present

## 2015-10-05 DIAGNOSIS — Z833 Family history of diabetes mellitus: Secondary | ICD-10-CM | POA: Diagnosis not present

## 2015-10-05 DIAGNOSIS — R1110 Vomiting, unspecified: Secondary | ICD-10-CM

## 2015-10-05 DIAGNOSIS — E785 Hyperlipidemia, unspecified: Secondary | ICD-10-CM | POA: Diagnosis not present

## 2015-10-05 DIAGNOSIS — D649 Anemia, unspecified: Secondary | ICD-10-CM | POA: Insufficient documentation

## 2015-10-05 DIAGNOSIS — Z794 Long term (current) use of insulin: Secondary | ICD-10-CM | POA: Insufficient documentation

## 2015-10-05 DIAGNOSIS — Z885 Allergy status to narcotic agent status: Secondary | ICD-10-CM | POA: Insufficient documentation

## 2015-10-05 DIAGNOSIS — F329 Major depressive disorder, single episode, unspecified: Secondary | ICD-10-CM | POA: Diagnosis not present

## 2015-10-05 DIAGNOSIS — D473 Essential (hemorrhagic) thrombocythemia: Secondary | ICD-10-CM | POA: Diagnosis not present

## 2015-10-05 DIAGNOSIS — E860 Dehydration: Secondary | ICD-10-CM | POA: Insufficient documentation

## 2015-10-05 DIAGNOSIS — R112 Nausea with vomiting, unspecified: Secondary | ICD-10-CM | POA: Diagnosis not present

## 2015-10-05 DIAGNOSIS — K219 Gastro-esophageal reflux disease without esophagitis: Secondary | ICD-10-CM | POA: Insufficient documentation

## 2015-10-05 DIAGNOSIS — F419 Anxiety disorder, unspecified: Secondary | ICD-10-CM | POA: Diagnosis not present

## 2015-10-05 DIAGNOSIS — Z7982 Long term (current) use of aspirin: Secondary | ICD-10-CM | POA: Insufficient documentation

## 2015-10-05 DIAGNOSIS — Z87891 Personal history of nicotine dependence: Secondary | ICD-10-CM | POA: Diagnosis not present

## 2015-10-05 LAB — COMPREHENSIVE METABOLIC PANEL
ALBUMIN: 4.5 g/dL (ref 3.5–5.0)
ALK PHOS: 73 U/L (ref 38–126)
ALT: 23 U/L (ref 17–63)
AST: 20 U/L (ref 15–41)
Anion gap: 9 (ref 5–15)
BILIRUBIN TOTAL: 0.5 mg/dL (ref 0.3–1.2)
BUN: 19 mg/dL (ref 6–20)
CALCIUM: 9.2 mg/dL (ref 8.9–10.3)
CO2: 26 mmol/L (ref 22–32)
Chloride: 104 mmol/L (ref 101–111)
Creatinine, Ser: 0.94 mg/dL (ref 0.61–1.24)
GFR calc Af Amer: >60 mL/min (ref >60)
GLUCOSE: 313 mg/dL — AB (ref 65–99)
Potassium: 3.9 mmol/L (ref 3.5–5.1)
Sodium: 139 mmol/L (ref 135–145)
TOTAL PROTEIN: 7.3 g/dL (ref 6.5–8.1)

## 2015-10-05 LAB — CBC
HCT: 36.7 % — ABNORMAL LOW (ref 40.0–52.0)
Hemoglobin: 13 g/dL (ref 13.0–18.0)
MCH: 31 pg (ref 26.0–34.0)
MCHC: 35.4 g/dL (ref 32.0–36.0)
MCV: 87.7 fL (ref 80.0–100.0)
PLATELETS: 444 10*3/uL — AB (ref 150–440)
RBC: 4.18 MIL/uL — AB (ref 4.40–5.90)
RDW: 13.3 % (ref 11.5–14.5)
WBC: 9.4 10*3/uL (ref 3.8–10.6)

## 2015-10-05 LAB — CBC WITH DIFFERENTIAL/PLATELET
BASOS ABS: 0 10*3/uL (ref 0–0.1)
BASOS PCT: 0 %
EOS ABS: 0 10*3/uL (ref 0–0.7)
EOS PCT: 0 %
HCT: 23.3 % — ABNORMAL LOW (ref 40.0–52.0)
Hemoglobin: 7.9 g/dL — ABNORMAL LOW (ref 13.0–18.0)
Lymphocytes Relative: 16 %
Lymphs Abs: 0.7 10*3/uL — ABNORMAL LOW (ref 1.0–3.6)
MCH: 30.1 pg (ref 26.0–34.0)
MCHC: 33.7 g/dL (ref 32.0–36.0)
MCV: 89.1 fL (ref 80.0–100.0)
MONO ABS: 0.2 10*3/uL (ref 0.2–1.0)
Monocytes Relative: 5 %
Neutro Abs: 3.3 10*3/uL (ref 1.4–6.5)
Neutrophils Relative %: 79 %
PLATELETS: 342 10*3/uL (ref 150–440)
RBC: 2.62 MIL/uL — ABNORMAL LOW (ref 4.40–5.90)
RDW: 12.6 % (ref 11.5–14.5)
WBC: 4.2 10*3/uL (ref 3.8–10.6)

## 2015-10-05 LAB — LIPASE, BLOOD: LIPASE: 24 U/L (ref 11–51)

## 2015-10-05 MED ORDER — MECLIZINE HCL 25 MG PO TABS: 25.0000 mg | ORAL_TABLET | Freq: Three times a day (TID) | ORAL | 1 refills | Status: DC | PRN

## 2015-10-05 MED ORDER — SODIUM CHLORIDE 0.9 % IV BOLUS (SEPSIS)
500.0000 mL | Freq: Once | INTRAVENOUS | Status: AC
  Administered 2015-10-05: 500 mL via INTRAVENOUS

## 2015-10-05 MED ORDER — MECLIZINE HCL 25 MG PO TABS
50.0000 mg | ORAL_TABLET | Freq: Once | ORAL | Status: AC
  Administered 2015-10-05: 50 mg via ORAL
  Filled 2015-10-05: qty 2

## 2015-10-05 MED ORDER — LORAZEPAM 2 MG/ML IJ SOLN
1.0000 mg | Freq: Once | INTRAMUSCULAR | Status: AC
  Administered 2015-10-05: 1 mg via INTRAVENOUS
  Filled 2015-10-05: qty 1

## 2015-10-05 MED ORDER — GI COCKTAIL ~~LOC~~
30.0000 mL | ORAL | Status: AC
Start: 2015-10-05 — End: 2015-10-05
  Administered 2015-10-05: 30 mL via ORAL
  Filled 2015-10-05: qty 30

## 2015-10-05 MED ORDER — LORAZEPAM 0.5 MG PO TABS: 0.5000 mg | ORAL_TABLET | Freq: Four times a day (QID) | ORAL | 0 refills | Status: DC | PRN

## 2015-10-05 MED ORDER — METHYLPREDNISOLONE SODIUM SUCC 125 MG IJ SOLR
60.0000 mg | Freq: Once | INTRAMUSCULAR | Status: AC
  Administered 2015-10-05: 60 mg via INTRAVENOUS
  Filled 2015-10-05: qty 2

## 2015-10-05 NOTE — ED Triage Notes (Signed)
Pt from home via EMS, pt reports N/V since this morning, per EMS BP 142/60 sitting and 110/68 standing. Pt presents pale and diaphoretic . EMS gave 4 zofran and 458mL fluid bolus

## 2015-10-05 NOTE — ED Provider Notes (Signed)
[facility] Emergency Department Provider Note  ____________________________________________  Time seen: Approximately 8:08 PM  I have reviewed the triage vital signs and the nursing notes.   HISTORY  Chief Complaint Emesis    HPI Garrett Yoder. is a 70 y.o. male who complains of nausea for the past week to 10 intermittent. Today he also started having vomiting. He reports he has not been able to eat or drink much in the past week. Denies any pain. No abdominal pain chest pain shortness of breath fever chills or sweats. Never had anything like this before. Only major medical history is diabetes for which he takes insulin. No trauma.     Past Medical History:  Diagnosis Date  . Anemia 08/14/2015  . Anxiety   . Depression   . Diabetes mellitus without complication ([place])   . GERD (gastroesophageal reflux disease)   . Hyperlipidemia   . Hypertension   . Thrombocytosis ([place]) 08/14/2015     Patient Active Problem List   Diagnosis Date Noted  . Acid reflux 09/09/2015  . Colon cancer screening 08/14/2015  . Essential hypertension, benign 08/14/2015  . Type 2 diabetes mellitus ([place]) 08/14/2015  . Hyperlipidemia 08/14/2015  . Medication monitoring encounter 08/14/2015  . Anemia 08/14/2015  . Thrombocytosis ([place]) 08/14/2015     Past Surgical History:  Procedure Laterality Date  . CATARACT EXTRACTION    . RETINAL LASER PROCEDURE       Prior to Admission medications   Medication Sig Start Date End Date Taking? Authorizing Provider  aspirin EC 81 MG tablet Take by mouth.   Yes Historical Provider, MD  Brinzolamide-Brimonidine 1-0.2 % SUSP Apply to eye.   Yes Historical Provider, MD  Insulin Detemir (LEVEMIR FLEXTOUCH) 100 UNIT/ML Pen Inject 30 Units into the skin every morning. 08/14/15  Yes [name], MD  irbesartan (AVAPRO) 150 MG tablet Take 1 tablet by mouth  daily 08/14/15  Yes [name], MD  metFORMIN (GLUCOPHAGE) 1000 MG tablet  Take 1 tablet (1,000 mg total) by mouth 2 (two) times daily with a meal. 08/14/15  Yes [name], MD  Multiple Vitamin (MULTI-VITAMINS) TABS Take by mouth.   Yes Historical Provider, MD  simvastatin (ZOCOR) 40 MG tablet Take 1 tablet (40 mg total) by mouth at bedtime. 08/14/15  Yes [name], MD  tamsulosin (FLOMAX) 0.4 MG CAPS capsule Take 1 capsule by mouth  daily 02/26/15  Yes Ashok Norris, MD  LORazepam (ATIVAN) 0.5 MG tablet Take 1 tablet (0.5 mg total) by mouth every 6 (six) hours as needed (vertigo). 10/05/15 10/12/15  Carrie Mew, MD  meclizine (ANTIVERT) 25 MG tablet Take 1 tablet (25 mg total) by mouth 3 (three) times daily as needed for dizziness or nausea. 10/05/15   Carrie Mew, MD  NOVOFINE 32G X 6 MM MISC USE DAILY AS DIRECTED 07/03/15   Ashok Norris, MD  RELION ULTIMA TEST test strip USE ONE STRIP TO CHECK GLUCOSE TWICE DAILY 06/13/15   Ashok Norris, MD     Allergies Codeine   No family history on file.  Social History Social History  Substance Use Topics  . Smoking status: Former Research scientist (life sciences)  . Smokeless tobacco: Never Used  . Alcohol use No    Review of Systems  Constitutional:   No fever or chills.  ENT:   No sore throat. No rhinorrhea. Cardiovascular:   No chest pain. Respiratory:   No dyspnea or cough. Gastrointestinal:   Negative for abdominal pain, Positive  nausea and vomiting. No constipation..  Genitourinary:   Negative for dysuria or difficulty urinating. Musculoskeletal:   Negative for focal pain or swelling Neurological:   Negative for headaches 10-point ROS otherwise negative.  ____________________________________________   PHYSICAL EXAM:  VITAL SIGNS: ED Triage Vitals  Enc Vitals Group     BP 10/05/15 2000 (!) 156/82     Pulse Rate 10/05/15 2000 76     Resp 10/05/15 2000 (!) 29     Temp --      Temp src --      SpO2 10/05/15 2000 100 %     Weight 10/05/15 1956 123 lb (55.8 kg)     Height 10/05/15 1956 5\' 8"  (1.727 m)      Head Circumference --      Peak Flow --      Pain Score --      Pain Loc --      Pain Edu? --      Excl. in [place]? --     Vital signs reviewed, nursing assessments reviewed.   Constitutional:   Alert and oriented. Well appearing and in no distress. Eyes:   No scleral icterus. No conjunctival pallor. PERRL. EOMI.  No nystagmus. ENT   Head:   Normocephalic and atraumatic.   Nose:   No congestion/rhinnorhea. No septal hematoma   Mouth/Throat:   Dry mucous membranes, no pharyngeal erythema. No peritonsillar mass.    Neck:   No stridor. No SubQ emphysema. No meningismus. Hematological/Lymphatic/Immunilogical:   No cervical lymphadenopathy. Cardiovascular:   RRR. Symmetric bilateral radial and DP pulses.  No murmurs.  Respiratory:   Normal respiratory effort without tachypnea nor retractions. Breath sounds are clear and equal bilaterally. No wheezes/rales/rhonchi. Gastrointestinal:   Soft with right upper quadrant tenderness. Non distended. There is no CVA tenderness.  No rebound, rigidity, or guarding. Rectal exam reveals brown stool, Hemoccult negative, QC controls okay Genitourinary:   deferred Musculoskeletal:   Nontender with normal range of motion in all extremities. No joint effusions.  No lower extremity tenderness.  No edema. Neurologic:   Normal speech and language.  CN 2-10 normal. Motor grossly intact. No gross focal neurologic deficits are appreciated.  Skin:    Skin is warm, dry and intact. No rash noted.  No petechiae, purpura, or bullae.  ____________________________________________    LABS (pertinent positives/negatives) (all labs ordered are listed, but only abnormal results are displayed) Labs Reviewed  COMPREHENSIVE METABOLIC PANEL - Abnormal; Notable for the following:       Result Value   Glucose, Bld 313 (*)    All other components within normal limits  CBC WITH DIFFERENTIAL/PLATELET - Abnormal; Notable for the following:    RBC 2.62 (*)     Hemoglobin 7.9 (*)    HCT 23.3 (*)    Lymphs Abs 0.7 (*)    All other components within normal limits  CBC - Abnormal; Notable for the following:    RBC 4.18 (*)    HCT 36.7 (*)    Platelets 444 (*)    All other components within normal limits  LIPASE, BLOOD   ____________________________________________   EKG  Interpreted by me Sinus rhythm rate of 73, normal axis intervals QRS ST segments and T waves  ____________________________________________    RADIOLOGY  CT head unremarkable Ultrasound right upper quadrant unremarkable  ____________________________________________   PROCEDURES Procedures  ____________________________________________   INITIAL IMPRESSION / ASSESSMENT AND PLAN / ED COURSE  Pertinent labs & imaging results that were available during  my care of the patient were reviewed by me and considered in my medical decision making (see chart for details).  N/v, ruq pain. Check labs, [place] ruq.  IVF, meclizine for vertigo sx. patient is calm and comfortable at present time after receiving IV Zofran, abdomen is nonsurgical.     Clinical Course  Comment By Time  Dizziness not improved.  Solumedrol, ativan, ct head.  Carrie Mew, MD 08/18 2138    ----------------------------------------- 11:01 PM on 10/05/2015 -----------------------------------------  CT head negative. Symptoms improving. Repeat CBC is stable compared to previous labs prior to today's evaluation, confirming that the first CBC was erroneous and likely diluted. We'll discharge to follow up with primary care. Low suspicion for meningitis and cephalitis stroke intracranial hemorrhage and hypertension or carotid dissection. ____________________________________________   FINAL CLINICAL IMPRESSION(S) / ED DIAGNOSES  Final diagnoses:  Vertigo       Portions of this note were generated with dragon dictation software. Dictation errors may occur despite best attempts at  proofreading.    Carrie Mew, MD 10/05/15 [id number]

## 2015-10-05 NOTE — ED Notes (Signed)
Pt. Discharged to car in parking lot with wife.  Pt. Started vomiting again when pt. Was leaving parking lot in car.  Pt. Returned to room.  Pt. Vomiting once when returned back into room.

## 2015-10-05 NOTE — ED Notes (Signed)
Patient wife into the ED and states that he was just placed in the care to go home and he started vomiting almost immediately and states that "he can't go home like that".

## 2015-10-05 NOTE — ED Notes (Signed)
meds given for dizziness and nausea.  Family with pt.  Pt alert.

## 2015-10-05 NOTE — ED Notes (Signed)
Pt reports no bm for 1 month.  No abd pain. Menses now.  Pt states using otc meds without relief of constipation.  Pt eating lollipop on arrival to treatment room

## 2015-10-05 NOTE — ED Notes (Signed)
Pt continues to have nausea and dizziness.  Alert and speech is clear.

## 2015-10-05 NOTE — ED Notes (Signed)
Pt brought in via ems from home with n/v and dizziness.  Sx for 5 days.  Sx worse today with dizziness and vomiting.  zofran given by ems with relief.  md at bedside.  Pt alert.  Speech clear.  Pt pale.  Iv fluids infusing.

## 2015-10-05 NOTE — ED Notes (Signed)
meds given.  Fluids infused.  Pt alert.  Family with pt.

## 2015-10-05 NOTE — ED Notes (Signed)
Pt. Going home with family. 

## 2015-10-06 ENCOUNTER — Encounter: Payer: Self-pay | Admitting: Internal Medicine

## 2015-10-06 DIAGNOSIS — E860 Dehydration: Secondary | ICD-10-CM | POA: Diagnosis not present

## 2015-10-06 DIAGNOSIS — D473 Essential (hemorrhagic) thrombocythemia: Secondary | ICD-10-CM | POA: Diagnosis not present

## 2015-10-06 DIAGNOSIS — R112 Nausea with vomiting, unspecified: Secondary | ICD-10-CM | POA: Diagnosis not present

## 2015-10-06 DIAGNOSIS — R42 Dizziness and giddiness: Secondary | ICD-10-CM | POA: Diagnosis not present

## 2015-10-06 LAB — CBC
HCT: 35.8 % — ABNORMAL LOW (ref 40.0–52.0)
Hemoglobin: 12.5 g/dL — ABNORMAL LOW (ref 13.0–18.0)
MCH: 30.8 pg (ref 26.0–34.0)
MCHC: 35 g/dL (ref 32.0–36.0)
MCV: 88.1 fL (ref 80.0–100.0)
PLATELETS: 523 10*3/uL — AB (ref 150–440)
RBC: 4.07 MIL/uL — AB (ref 4.40–5.90)
RDW: 13.1 % (ref 11.5–14.5)
WBC: 10.7 10*3/uL — AB (ref 3.8–10.6)

## 2015-10-06 LAB — BASIC METABOLIC PANEL
Anion gap: 8 (ref 5–15)
BUN: 20 mg/dL (ref 6–20)
CHLORIDE: 109 mmol/L (ref 101–111)
CO2: 24 mmol/L (ref 22–32)
CREATININE: 0.93 mg/dL (ref 0.61–1.24)
Calcium: 8.9 mg/dL (ref 8.9–10.3)
GFR calc non Af Amer: >60 mL/min (ref >60)
Glucose, Bld: 318 mg/dL — ABNORMAL HIGH (ref 65–99)
POTASSIUM: 4.2 mmol/L (ref 3.5–5.1)
SODIUM: 141 mmol/L (ref 135–145)

## 2015-10-06 MED ORDER — INSULIN DETEMIR 100 UNIT/ML ~~LOC~~ SOLN
30.0000 [IU] | Freq: Every morning | SUBCUTANEOUS | Status: DC
  Administered 2015-10-06: 30 [IU] via SUBCUTANEOUS
  Filled 2015-10-06: qty 0.3

## 2015-10-06 MED ORDER — ENOXAPARIN SODIUM 40 MG/0.4ML ~~LOC~~ SOLN
40.0000 mg | Freq: Every day | SUBCUTANEOUS | Status: DC
  Administered 2015-10-06: 40 mg via SUBCUTANEOUS
  Filled 2015-10-06: qty 0.4

## 2015-10-06 MED ORDER — TAMSULOSIN HCL 0.4 MG PO CAPS
0.4000 mg | ORAL_CAPSULE | Freq: Every day | ORAL | Status: DC
  Administered 2015-10-06: 0.4 mg via ORAL
  Filled 2015-10-06: qty 1

## 2015-10-06 MED ORDER — ONDANSETRON HCL 4 MG/2ML IJ SOLN: 4.0000 mg | Freq: Four times a day (QID) | INTRAMUSCULAR | Status: DC | PRN

## 2015-10-06 MED ORDER — MECLIZINE HCL 25 MG PO TABS: 25.0000 mg | ORAL_TABLET | Freq: Three times a day (TID) | ORAL | Status: DC | PRN

## 2015-10-06 MED ORDER — SIMVASTATIN 40 MG PO TABS: 40.0000 mg | ORAL_TABLET | Freq: Every day | ORAL | Status: DC

## 2015-10-06 MED ORDER — SODIUM CHLORIDE 0.9 % IV SOLN
INTRAVENOUS | Status: DC
  Administered 2015-10-06: 04:00:00 via INTRAVENOUS

## 2015-10-06 MED ORDER — SODIUM CHLORIDE 0.9 % IV BOLUS (SEPSIS)
1000.0000 mL | Freq: Once | INTRAVENOUS | Status: AC
  Administered 2015-10-06: 1000 mL via INTRAVENOUS

## 2015-10-06 MED ORDER — ONDANSETRON HCL 4 MG PO TABS: 4.0000 mg | ORAL_TABLET | Freq: Four times a day (QID) | ORAL | Status: DC | PRN

## 2015-10-06 MED ORDER — SODIUM CHLORIDE 0.9% FLUSH
3.0000 mL | Freq: Two times a day (BID) | INTRAVENOUS | Status: DC
  Administered 2015-10-06: 3 mL via INTRAVENOUS

## 2015-10-06 MED ORDER — IRBESARTAN 150 MG PO TABS
150.0000 mg | ORAL_TABLET | Freq: Every day | ORAL | Status: DC
  Administered 2015-10-06: 150 mg via ORAL
  Filled 2015-10-06: qty 1

## 2015-10-06 MED ORDER — METFORMIN HCL 500 MG PO TABS
1000.0000 mg | ORAL_TABLET | Freq: Two times a day (BID) | ORAL | Status: DC
  Administered 2015-10-06: 1000 mg via ORAL
  Filled 2015-10-06: qty 2

## 2015-10-06 MED ORDER — DIAZEPAM 5 MG/ML IJ SOLN
2.0000 mg | Freq: Once | INTRAMUSCULAR | Status: AC
  Administered 2015-10-06: 2 mg via INTRAVENOUS
  Filled 2015-10-06: qty 2

## 2015-10-06 MED ORDER — ACETAMINOPHEN 650 MG RE SUPP: 650.0000 mg | Freq: Four times a day (QID) | RECTAL | Status: DC | PRN

## 2015-10-06 MED ORDER — ONDANSETRON HCL 4 MG/2ML IJ SOLN
4.0000 mg | Freq: Once | INTRAMUSCULAR | Status: AC
  Administered 2015-10-06: 4 mg via INTRAVENOUS
  Filled 2015-10-06: qty 2

## 2015-10-06 MED ORDER — ASPIRIN EC 81 MG PO TBEC
81.0000 mg | DELAYED_RELEASE_TABLET | Freq: Every day | ORAL | Status: DC
  Administered 2015-10-06: 81 mg via ORAL
  Filled 2015-10-06: qty 1

## 2015-10-06 MED ORDER — ACETAMINOPHEN 325 MG PO TABS: 650.0000 mg | ORAL_TABLET | Freq: Four times a day (QID) | ORAL | Status: DC | PRN

## 2015-10-06 MED ORDER — SENNOSIDES-DOCUSATE SODIUM 8.6-50 MG PO TABS: 1.0000 | ORAL_TABLET | Freq: Every evening | ORAL | Status: DC | PRN

## 2015-10-06 NOTE — Discharge Summary (Signed)
[facility] [place] NAME: Garrett Yoder    MR#:  [id number]  DATE OF BIRTH:  1945/10/24  DATE OF ADMISSION:  10/05/2015 ADMITTING PHYSICIAN: Saundra Shelling, MD  DATE OF DISCHARGE: 10/06/15  PRIMARY CARE PHYSICIAN: Ashok Norris, MD    ADMISSION DIAGNOSIS:  Vertigo [R42] Intractable vomiting with nausea, vomiting of unspecified type [R11.10]  DISCHARGE DIAGNOSIS:  Acute vertigo  SECONDARY DIAGNOSIS:   Past Medical History:  Diagnosis Date  . Anemia 08/14/2015  . Anxiety   . Depression   . Diabetes mellitus without complication ([place])   . GERD (gastroesophageal reflux disease)   . Hyperlipidemia   . Hypertension   . Thrombocytosis ([place]) 08/14/2015    HOSPITAL COURSE:   70 year old male patient with history of hypertension, hyperlipidemia, thrombocytosis, type 2 diabetes mellitus, GERD presented to the emergency room with nausea and vomiting and dizziness.  1. Vertigo -much improved Received meclizine that helped -pt reports having seen ENT in the past for similar symptoms 2. Intractable nausea vomiting-resolved -ate regular food this am  3. Dehydration resolved 4. Thrombocytosis-appears reactive 5. Hypertension-cont home meds  Overall stable ambulate prior to d/c  CONSULTS OBTAINED:    DRUG ALLERGIES:   Allergies  Allergen Reactions  . Codeine     Other reaction(s): makes him sick to his stomach    DISCHARGE MEDICATIONS:   Current Discharge Medication List    START taking these medications   Details  meclizine (ANTIVERT) 25 MG tablet Take 1 tablet (25 mg total) by mouth 3 (three) times daily as needed for dizziness or nausea. Qty: 30 tablet, Refills: 1      CONTINUE these medications which have NOT CHANGED   Details  aspirin EC 81 MG tablet Take by mouth.    Brinzolamide-Brimonidine 1-0.2 % SUSP Apply to eye.    Insulin Detemir (LEVEMIR FLEXTOUCH) 100 UNIT/ML Pen Inject 30 Units into the skin  every morning. Qty: 45 mL, Refills: 1    irbesartan (AVAPRO) 150 MG tablet Take 1 tablet by mouth  daily Qty: 90 tablet, Refills: 1    metFORMIN (GLUCOPHAGE) 1000 MG tablet Take 1 tablet (1,000 mg total) by mouth 2 (two) times daily with a meal. Qty: 180 tablet, Refills: 1    Multiple Vitamin (MULTI-VITAMINS) TABS Take by mouth.    simvastatin (ZOCOR) 40 MG tablet Take 1 tablet (40 mg total) by mouth at bedtime. Qty: 90 tablet, Refills: 1    tamsulosin (FLOMAX) 0.4 MG CAPS capsule Take 1 capsule by mouth  daily Qty: 90 capsule, Refills: 3    NOVOFINE 32G X 6 MM MISC USE DAILY AS DIRECTED Qty: 100 each, Refills: 6    RELION ULTIMA TEST test strip USE ONE STRIP TO CHECK GLUCOSE TWICE DAILY Qty: 150 each, Refills: 0        If you experience worsening of your admission symptoms, develop shortness of breath, life threatening emergency, suicidal or homicidal thoughts you must seek medical attention immediately by calling 911 or calling your MD immediately  if symptoms less severe.  You Must read complete instructions/literature along with all the possible adverse reactions/side effects for all the Medicines you take and that have been prescribed to you. Take any new Medicines after you have completely understood and accept all the possible adverse reactions/side effects.   Please note  You were cared for by a hospitalist during your hospital stay. If you have any questions about your discharge medications or the care you received  while you were in the hospital after you are discharged, you can call the unit and asked to speak with the hospitalist on call if the hospitalist that took care of you is not available. Once you are discharged, your primary care physician will handle any further medical issues. Please note that NO REFILLS for any discharge medications will be authorized once you are discharged, as it is imperative that you return to your primary care physician (or establish a  relationship with a primary care physician if you do not have one) for your aftercare needs so that they can reassess your need for medications and monitor your lab values. Today   SUBJECTIVE  Feels better todfay   VITAL SIGNS:  Blood pressure (!) 119/46, pulse 84, temperature 98.4 F (36.9 C), temperature source Oral, resp. rate 16, height 5\' 8"  (1.727 m), weight 119 lb 1.6 oz (54 kg), SpO2 100 %.  I/O:   Intake/Output Summary (Last 24 hours) at 10/06/15 1131 Last data filed at 10/06/15 1007  Gross per 24 hour  Intake              390 ml  Output              350 ml  Net               40 ml    PHYSICAL EXAMINATION:  GENERAL:  70 y.o.-year-old patient lying in the bed with no acute distress.  EYES: Pupils equal, round, reactive to light and accommodation. No scleral icterus. Extraocular muscles intact.  HEENT: Head atraumatic, normocephalic. Oropharynx and nasopharynx clear.  NECK:  Supple, no jugular venous distention. No thyroid enlargement, no tenderness.  LUNGS: Normal breath sounds bilaterally, no wheezing, rales,rhonchi or crepitation. No use of accessory muscles of respiration.  CARDIOVASCULAR: S1, S2 normal. No murmurs, rubs, or gallops.  ABDOMEN: Soft, non-tender, non-distended. Bowel sounds present. No organomegaly or mass.  EXTREMITIES: No pedal edema, cyanosis, or clubbing.  NEUROLOGIC: Cranial nerves II through XII are intact. Muscle strength 5/5 in all extremities. Sensation intact. Gait not checked.  PSYCHIATRIC: The patient is alert and oriented x 3.  SKIN: No obvious rash, lesion, or ulcer.   DATA REVIEW:   CBC   Recent Labs Lab 10/06/15 0658  WBC 10.7*  HGB 12.5*  HCT 35.8*  PLT 523*    Chemistries   Recent Labs Lab 10/05/15 1958 10/06/15 0658  NA 139 141  K 3.9 4.2  CL 104 109  CO2 26 24  GLUCOSE 313* 318*  BUN 19 20  CREATININE 0.94 0.93  CALCIUM 9.2 8.9  AST 20  --   ALT 23  --   ALKPHOS 73  --   BILITOT 0.5  --     Microbiology  Results   No results found for this or any previous visit (from the past 240 hour(s)).  RADIOLOGY:  Ct Head Wo Contrast  Result Date: 10/05/2015 CLINICAL DATA:  Nausea and vomiting and dizziness for 5 days. Symptoms worsened today. EXAM: CT HEAD WITHOUT CONTRAST TECHNIQUE: Contiguous axial images were obtained from the base of the skull through the vertex without intravenous contrast. COMPARISON:  Brain MRI 10/29/2011 FINDINGS: Brain: Brain parenchyma is normal for age. There is no mass, bleed or extra-axial collection. No midline shift or mass effect. Ventricles and sulci are normal for age. No CT evidence of acute cortical infarct. Vascular: Normal Skull: Normal Sinuses/Orbits: The orbits are normal aside from a right lens replacement. Paranasal sinuses and mastoids are  free of fluid. Other: Previously described intrasellar Rathke's cleft cyst is not well depicted on this examination. IMPRESSION: Normal head CT for age. Electronically Signed   By: Ulyses Jarred M.D.   On: 10/05/2015 22:34   US Abdomen Limited Ruq  Result Date: 10/05/2015 CLINICAL DATA:  Acute onset of right lower quadrant abdominal tenderness, nausea and vomiting. Initial encounter. EXAM: US ABDOMEN LIMITED - RIGHT UPPER QUADRANT COMPARISON:  CT of the abdomen and pelvis from 08/31/2012 FINDINGS: Gallbladder: No gallstones or wall thickening visualized. No sonographic Murphy sign noted by sonographer. Common bile duct: Diameter: 0.3 cm, within normal limits in caliber. Liver: No focal lesion identified. Within normal limits in parenchymal echogenicity. IMPRESSION: Unremarkable ultrasound of the right upper quadrant. Electronically Signed   By: Garald Balding M.D.   On: 10/05/2015 21:03     Management plans discussed with the patient, family and they are in agreement.  CODE STATUS:     Code Status Orders        Start     Ordered   10/06/15 0345  Full code  Continuous     10/06/15 0344    Code Status History    Date  Active Date Inactive Code Status Order ID Comments User Context   This patient has a current code status but no historical code status.      TOTAL TIME TAKING CARE OF THIS PATIENT: 40 minutes.    Ronelle Michie M.D on 10/06/2015 at 11:31 AM  Between 7am to 6pm - Pager - [phone] After 6pm go to [email or web address] - password [email or web address] [facility]  Office  [phone]  CC: Primary care physician; Ashok Norris, MD

## 2015-10-06 NOTE — H&P (Signed)
[facility] at [place] NAME: Garrett Yoder    MR#:  [id number]  DATE OF BIRTH:  02/18/1945  DATE OF ADMISSION:  10/05/2015  PRIMARY CARE PHYSICIAN: Ashok Norris, MD   REQUESTING/REFERRING PHYSICIAN:   CHIEF COMPLAINT:   Chief Complaint  Patient presents with  . Emesis    HISTORY OF PRESENT ILLNESS: Garrett Yoder  is a 70 y.o. male with a known history of Diabetes mellitus type 2, GERD, hyperlipidemia, hypertension, thrombocytosis presented to the emergency room for dizziness and nausea and vomiting. Patient has nausea and vomiting going on and off for the last couple of days to a week. Vomitus contained food and water. No history of any hematemesis or hemoptysis. Patient also has dizziness since yesterday. Patient was discharged from the emergency room and he went to the parking lot and he felt very dizzy and threw up couple of times and came back to the emergency room. No complaints of chest pain. No complaints of shortness of breath. No complaints of any headache or blurry vision. Patient is oriented to time, place and person and self and responds to all verbal commands.  PAST MEDICAL HISTORY:   Past Medical History:  Diagnosis Date  . Anemia 08/14/2015  . Anxiety   . Depression   . Diabetes mellitus without complication ([place])   . GERD (gastroesophageal reflux disease)   . Hyperlipidemia   . Hypertension   . Thrombocytosis ([place]) 08/14/2015    PAST SURGICAL HISTORY: Past Surgical History:  Procedure Laterality Date  . CATARACT EXTRACTION    . RETINAL LASER PROCEDURE      SOCIAL HISTORY:  Social History  Substance Use Topics  . Smoking status: Former Research scientist (life sciences)  . Smokeless tobacco: Never Used  . Alcohol use No    FAMILY HISTORY:  Family History  Problem Relation Age of Onset  . Diabetes Mellitus II Father     DRUG ALLERGIES:  Allergies  Allergen Reactions  . Codeine     Other reaction(s): makes him sick to his  stomach    REVIEW OF SYSTEMS:   CONSTITUTIONAL: No fever, has weakness.  EYES: No blurred or double vision.  EARS, NOSE, AND THROAT: No tinnitus or ear pain.  RESPIRATORY: No cough, shortness of breath, wheezing or hemoptysis.  CARDIOVASCULAR: No chest pain, orthopnea, edema.  GASTROINTESTINAL: Has nausea, vomiting, no diarrhea or abdominal pain.  GENITOURINARY: No dysuria, hematuria.  ENDOCRINE: No polyuria, nocturia,  HEMATOLOGY: No anemia, easy bruising or bleeding SKIN: No rash or lesion. MUSCULOSKELETAL: No joint pain or arthritis.   NEUROLOGIC: No tingling, numbness, weakness.Has dizziness  PSYCHIATRY: No anxiety or depression.   MEDICATIONS AT HOME:  Prior to Admission medications   Medication Sig Start Date End Date Taking? Authorizing Provider  aspirin EC 81 MG tablet Take by mouth.   Yes Historical Provider, MD  Brinzolamide-Brimonidine 1-0.2 % SUSP Apply to eye.   Yes Historical Provider, MD  Insulin Detemir (LEVEMIR FLEXTOUCH) 100 UNIT/ML Pen Inject 30 Units into the skin every morning. 08/14/15  Yes [name], MD  irbesartan (AVAPRO) 150 MG tablet Take 1 tablet by mouth  daily 08/14/15  Yes [name], MD  metFORMIN (GLUCOPHAGE) 1000 MG tablet Take 1 tablet (1,000 mg total) by mouth 2 (two) times daily with a meal. 08/14/15  Yes [name], MD  Multiple Vitamin (MULTI-VITAMINS) TABS Take by mouth.   Yes Historical Provider, MD  simvastatin (ZOCOR) 40 MG tablet Take 1 tablet (40  mg total) by mouth at bedtime. 08/14/15  Yes [name], MD  tamsulosin (FLOMAX) 0.4 MG CAPS capsule Take 1 capsule by mouth  daily 02/26/15  Yes Ashok Norris, MD  LORazepam (ATIVAN) 0.5 MG tablet Take 1 tablet (0.5 mg total) by mouth every 6 (six) hours as needed (vertigo). 10/05/15 10/12/15  Carrie Mew, MD  meclizine (ANTIVERT) 25 MG tablet Take 1 tablet (25 mg total) by mouth 3 (three) times daily as needed for dizziness or nausea. 10/05/15   Carrie Mew, MD  NOVOFINE 32G X  6 MM MISC USE DAILY AS DIRECTED 07/03/15   Ashok Norris, MD  RELION ULTIMA TEST test strip USE ONE STRIP TO CHECK GLUCOSE TWICE DAILY 06/13/15   Ashok Norris, MD      PHYSICAL EXAMINATION:   VITAL SIGNS: Blood pressure 136/63, pulse 74, resp. rate 16, height 5\' 8"  (1.727 m), weight 55.8 kg (123 lb), SpO2 98 %.  GENERAL:  70 y.o.-year-old patient lying in the bed with no acute distress.  EYES: Pupils equal, round, reactive to light and accommodation. No scleral icterus. Extraocular muscles intact.  HEENT: Head atraumatic, normocephalic. Oropharynx dry and nasopharynx clear.  NECK:  Supple, no jugular venous distention. No thyroid enlargement, no tenderness.  LUNGS: Normal breath sounds bilaterally, no wheezing, rales,rhonchi or crepitation. No use of accessory muscles of respiration.  No dullness to percussion CARDIOVASCULAR: S1, S2 normal. No murmurs, rubs, or gallops.  ABDOMEN: Soft, nontender, nondistended. Bowel sounds present. No organomegaly or mass.  EXTREMITIES: No pedal edema, cyanosis, or clubbing.  NEUROLOGIC: Cranial nerves II through XII are intact. Muscle strength 5/5 in all extremities. Sensation intact. Gait not checked.  PSYCHIATRIC: The patient is alert and oriented x 3.  SKIN: No obvious rash, lesion, or ulcer.   LABORATORY PANEL:   CBC  Recent Labs Lab 10/05/15 1958 10/05/15 2105  WBC 4.2 9.4  HGB 7.9* 13.0  HCT 23.3* 36.7*  PLT 342 444*  MCV 89.1 87.7  MCH 30.1 31.0  MCHC 33.7 35.4  RDW 12.6 13.3  LYMPHSABS 0.7*  --   MONOABS 0.2  --   EOSABS 0.0  --   BASOSABS 0.0  --    ------------------------------------------------------------------------------------------------------------------  Chemistries   Recent Labs Lab 10/05/15 1958  NA 139  K 3.9  CL 104  CO2 26  GLUCOSE 313*  BUN 19  CREATININE 0.94  CALCIUM 9.2  AST 20  ALT 23  ALKPHOS 73  BILITOT 0.5    ------------------------------------------------------------------------------------------------------------------ estimated creatinine clearance is 58.5 mL/min (by C-G formula based on SCr of 0.94 mg/dL). ------------------------------------------------------------------------------------------------------------------ No results for input(s): TSH, T4TOTAL, T3FREE, THYROIDAB in the last 72 hours.  Invalid input(s): FREET3   Coagulation profile No results for input(s): INR, PROTIME in the last 168 hours. ------------------------------------------------------------------------------------------------------------------- No results for input(s): DDIMER in the last 72 hours. -------------------------------------------------------------------------------------------------------------------  Cardiac Enzymes No results for input(s): CKMB, TROPONINI, MYOGLOBIN in the last 168 hours.  Invalid input(s): CK ------------------------------------------------------------------------------------------------------------------ Invalid input(s): POCBNP  ---------------------------------------------------------------------------------------------------------------  Urinalysis No results found for: COLORURINE, APPEARANCEUR, LABSPEC, PHURINE, GLUCOSEU, HGBUR, BILIRUBINUR, KETONESUR, PROTEINUR, UROBILINOGEN, NITRITE, LEUKOCYTESUR   RADIOLOGY: Ct Head Wo Contrast  Result Date: 10/05/2015 CLINICAL DATA:  Nausea and vomiting and dizziness for 5 days. Symptoms worsened today. EXAM: CT HEAD WITHOUT CONTRAST TECHNIQUE: Contiguous axial images were obtained from the base of the skull through the vertex without intravenous contrast. COMPARISON:  Brain MRI 10/29/2011 FINDINGS: Brain: Brain parenchyma is normal for age. There is no mass, bleed or extra-axial collection. No midline  shift or mass effect. Ventricles and sulci are normal for age. No CT evidence of acute cortical infarct. Vascular: Normal Skull: Normal  Sinuses/Orbits: The orbits are normal aside from a right lens replacement. Paranasal sinuses and mastoids are free of fluid. Other: Previously described intrasellar Rathke's cleft cyst is not well depicted on this examination. IMPRESSION: Normal head CT for age. Electronically Signed   By: Ulyses Jarred M.D.   On: 10/05/2015 22:34   US Abdomen Limited Ruq  Result Date: 10/05/2015 CLINICAL DATA:  Acute onset of right lower quadrant abdominal tenderness, nausea and vomiting. Initial encounter. EXAM: US ABDOMEN LIMITED - RIGHT UPPER QUADRANT COMPARISON:  CT of the abdomen and pelvis from 08/31/2012 FINDINGS: Gallbladder: No gallstones or wall thickening visualized. No sonographic Murphy sign noted by sonographer. Common bile duct: Diameter: 0.3 cm, within normal limits in caliber. Liver: No focal lesion identified. Within normal limits in parenchymal echogenicity. IMPRESSION: Unremarkable ultrasound of the right upper quadrant. Electronically Signed   By: Garald Balding M.D.   On: 10/05/2015 21:03    EKG: Orders placed or performed during the hospital encounter of 10/05/15  . EKG 12-Lead  . EKG 12-Lead    IMPRESSION AND PLAN: 70 year old male patient with history of hypertension, hyperlipidemia, thrombocytosis, type 2 diabetes mellitus, GERD presented to the emergency room with nausea and vomiting and dizziness. Admitting diagnosis 1. Vertigo 2. Intractable nausea vomiting 3. Dehydration 4. Thrombocytosis 5. Hypertension Treatment plan Admit patient to medical floor observation bed Oral meclizine for dizziness and what I will Antiemetic medication IV fluid hydration Follow-up electrolytes Supportive care  All the records are reviewed and case discussed with ED provider. Management plans discussed with the patient, family and they are in agreement.  CODE STATUS:FULL Code Status History    This patient does not have a recorded code status. Please follow your organizational policy for  patients in this situation.       TOTAL TIME TAKING CARE OF THIS PATIENT: 50 minutes.    Saundra Shelling M.D on 10/06/2015 at 2:44 AM  Between 7am to 6pm - Pager - [phone]  After 6pm go to [email or web address] - password [email or web address] [facility]  Office  [phone]  CC: Primary care physician; Ashok Norris, MD

## 2015-10-06 NOTE — Care Management Obs Status (Signed)
[facility] NOTIFICATION   Patient Details  Name: Garrett Yoder. MRN: [id number] Date of Birth: 1945/12/15   Medicare Observation Status Notification Given:  No (discharge order in less than 24 hours)    Ival Bible, RN 10/06/2015, 5:46 PM

## 2015-10-06 NOTE — Progress Notes (Signed)
Pt. Arrived via stretcher and was transferred to bed by staff. Tele applied, BOX 10, NSR. Skin pale warm and dry. Second verifying nurse, Marcene Brawn, [place]. General room orientation given. How to use call bell system and ascom reviewed. Pt. Is alert and oriented, no SOB, pain or acute distress noted. No signs or c/o nausea or vomiting. Wife at bedside. Will continue to monitor pt.

## 2015-10-06 NOTE — Progress Notes (Signed)
Discharged to home with his wife.  IV DC'd.  Medications reviewed.  Will call MDs to make follow up appointments.

## 2015-10-06 NOTE — ED Provider Notes (Signed)
-----------------------------------------   12:46 AM on 10/06/2015 -----------------------------------------  Shortly after patient was discharged, he started to get into his car and began to vomit again. He was placed back into his treatment room. Received IV Valium for his vertigo symptoms. At this time he is resting a little more comfortably; however, nausea and vomiting remain once he moves about. Will discuss with hospitalist to evaluate patient in the emergency department for admission for intractable vomiting secondary to dizziness.   Paulette Blanch, MD 10/06/15 [phone]

## 2015-10-10 DIAGNOSIS — R42 Dizziness and giddiness: Secondary | ICD-10-CM | POA: Diagnosis not present

## 2015-11-08 ENCOUNTER — Other Ambulatory Visit: Payer: Self-pay | Admitting: Family Medicine

## 2015-11-16 DIAGNOSIS — H34811 Central retinal vein occlusion, right eye, with macular edema: Secondary | ICD-10-CM | POA: Diagnosis not present

## 2015-11-19 ENCOUNTER — Telehealth: Payer: Self-pay | Admitting: Family Medicine

## 2015-11-19 MED ORDER — GLUCOSE BLOOD VI STRP: ORAL_STRIP | 5 refills | Status: DC

## 2015-11-19 NOTE — Telephone Encounter (Signed)
It may be sitting on Dr. Walker Kehr desktop; I'm approving right now

## 2015-11-19 NOTE — Telephone Encounter (Signed)
PT IS NEEDING REFILL ON REL-ULTRA TEST STRIPS ( RELI ON ULTIMA TEST STRIPS) TES REL TO [facility] ON [address]  PT SAID THAT [facility] HAS SENT OVER 2 REQUEST WITH NO RESPONSE.

## 2015-12-14 ENCOUNTER — Other Ambulatory Visit: Payer: Self-pay

## 2015-12-14 ENCOUNTER — Ambulatory Visit (INDEPENDENT_AMBULATORY_CARE_PROVIDER_SITE_OTHER): Payer: Medicare Other | Admitting: Family Medicine

## 2015-12-14 ENCOUNTER — Encounter: Payer: Self-pay | Admitting: Family Medicine

## 2015-12-14 VITALS — BP 132/68 | HR 74 | Temp 98.5°F | Resp 14 | Wt 131.0 lb

## 2015-12-14 DIAGNOSIS — Z5181 Encounter for therapeutic drug level monitoring: Secondary | ICD-10-CM

## 2015-12-14 DIAGNOSIS — E782 Mixed hyperlipidemia: Secondary | ICD-10-CM

## 2015-12-14 DIAGNOSIS — E119 Type 2 diabetes mellitus without complications: Secondary | ICD-10-CM

## 2015-12-14 DIAGNOSIS — D649 Anemia, unspecified: Secondary | ICD-10-CM

## 2015-12-14 DIAGNOSIS — Z794 Long term (current) use of insulin: Secondary | ICD-10-CM | POA: Diagnosis not present

## 2015-12-14 DIAGNOSIS — D75839 Thrombocytosis, unspecified: Secondary | ICD-10-CM

## 2015-12-14 DIAGNOSIS — D473 Essential (hemorrhagic) thrombocythemia: Secondary | ICD-10-CM | POA: Diagnosis not present

## 2015-12-14 DIAGNOSIS — I10 Essential (primary) hypertension: Secondary | ICD-10-CM

## 2015-12-14 DIAGNOSIS — R42 Dizziness and giddiness: Secondary | ICD-10-CM | POA: Diagnosis not present

## 2015-12-14 NOTE — Patient Instructions (Signed)
Try to limit saturated fats in your diet (bologna, hot dogs, barbeque, cheeseburgers, hamburgers, steak, bacon, sausage, cheese, etc.) and get more fresh fruits, vegetables, and whole grains  Return in 4 months for follow-up

## 2015-12-14 NOTE — Assessment & Plan Note (Signed)
Continue statin; limit eggs to no more than 3 per week; limit saturated fats

## 2015-12-14 NOTE — Assessment & Plan Note (Signed)
Monitor SGPT and Cr and K+ on current meds

## 2015-12-14 NOTE — Assessment & Plan Note (Signed)
Check CBC 

## 2015-12-14 NOTE — Assessment & Plan Note (Signed)
So glad that's all that it was in August; followed up with Dr. Manuella Ghazi; asymptomatic now

## 2015-12-14 NOTE — Assessment & Plan Note (Signed)
On aspirin; check CBC today

## 2015-12-14 NOTE — Assessment & Plan Note (Signed)
Foot exam by MD today, watch feet closely; check A1c; limit white bread, sugary drinks; eye exam UTD; aspirin therapy

## 2015-12-14 NOTE — Progress Notes (Signed)
BP 132/68   Pulse 74   Temp 98.5 F (36.9 C) (Oral)   Resp 14   Wt 131 lb (59.4 kg)   SpO2 97%   BMI 19.92 kg/m    Subjective:    Patient ID: Garrett Spikes., male    DOB: 1945/07/10, 70 y.o.   MRN: [id number]  HPI: Garrett Corniel. is a 70 y.o. male  Chief Complaint  Patient presents with  . Follow-up    He was in the hospital in August; he was diagnosed with vertigo; he was a little dizzy, a little off; he went to [facility] walk-in; got checked out, given meclizine; then Thursday, not much better, but not terrible, then got terrible on Friday afternoon; stood up out of bed and everything was spinning; sat back down without falling; then tried to get up again, and that's when it really broke loose; saw different colors and shapes; could not even walk to the car; called EMS, ambulance ride to the ER; spent the night there; discharged next day with vertigo; partial memory loss during the hospital stay; he followed up with Dr. Manuella Ghazi (neurologist) at [facility] records; admitted 10/05/15 and discharged 10/06/15; the doctor said it was ear crystals that got out of place Head CT 10/05/15: IMPRESSION: Normal head CT for age.  Electronically Signed   By: Ulyses Jarred M.D.   On: 10/05/2015 22:34 ----------------------------------- CBC reviewed; mild anemia, elevated platelets 523 First Hgb 7.9, recheck 13.0 Third Hgb on 10/06/15 was 12.5 No blood in the stool, no blood in urine ----------------------------------- Type 2 diabetes It's been pretty normal for him; runs 125 to 145 fasting; no numbness or tingling in the feet; last A1c 6.4  High cholesterol; not watching his diet as well as he could, does eat Cheerios; three eggs a week  He would like a flu shot (but will return)  Depression screen [facility] 2/9 12/14/2015 08/14/2015 11/20/2014  Decreased Interest 0 0 0  Down, Depressed, Hopeless 0 0 0  PHQ - 2 Score 0 0 0    No flowsheet data found.  Relevant past  medical, surgical, family and social history reviewed Past Medical History:  Diagnosis Date  . Anemia 08/14/2015  . Anxiety   . Depression   . Diabetes mellitus without complication ([place])   . GERD (gastroesophageal reflux disease)   . Hyperlipidemia   . Hypertension   . Thrombocytosis ([place]) 08/14/2015   Past Surgical History:  Procedure Laterality Date  . CATARACT EXTRACTION    . RETINAL LASER PROCEDURE     Family History  Problem Relation Age of Onset  . Diabetes Mellitus II Father    Social History  Substance Use Topics  . Smoking status: Former Research scientist (life sciences)  . Smokeless tobacco: Never Used  . Alcohol use No    Interim medical history since last visit reviewed. Allergies and medications reviewed  Review of Systems Per HPI unless specifically indicated above     Objective:    BP 132/68   Pulse 74   Temp 98.5 F (36.9 C) (Oral)   Resp 14   Wt 131 lb (59.4 kg)   SpO2 97%   BMI 19.92 kg/m   Wt Readings from Last 3 Encounters:  12/14/15 131 lb (59.4 kg)  10/06/15 119 lb 1.6 oz (54 kg)  08/14/15 129 lb (58.5 kg)    Physical Exam  Results for orders placed or performed during the hospital encounter of 10/05/15  Comprehensive metabolic panel  Result Value Ref Range   Sodium 139 135 - 145 mmol/L   Potassium 3.9 3.5 - 5.1 mmol/L   Chloride 104 101 - 111 mmol/L   CO2 26 22 - 32 mmol/L   Glucose, Bld 313 (H) 65 - 99 mg/dL   BUN 19 6 - 20 mg/dL   Creatinine, Ser 0.94 0.61 - 1.24 mg/dL   Calcium 9.2 8.9 - 10.3 mg/dL   Total Protein 7.3 6.5 - 8.1 g/dL   Albumin 4.5 3.5 - 5.0 g/dL   AST 20 15 - 41 U/L   ALT 23 17 - 63 U/L   Alkaline Phosphatase 73 38 - 126 U/L   Total Bilirubin 0.5 0.3 - 1.2 mg/dL   GFR calc non Af Amer >60 >60 mL/min   GFR calc Af Amer >60 >60 mL/min   Anion gap 9 5 - 15  CBC with Differential  Result Value Ref Range   WBC 4.2 3.8 - 10.6 K/uL   RBC 2.62 (L) 4.40 - 5.90 MIL/uL   Hemoglobin 7.9 (L) 13.0 - 18.0 g/dL   HCT 23.3 (L) 40.0 - 52.0 %     MCV 89.1 80.0 - 100.0 fL   MCH 30.1 26.0 - 34.0 pg   MCHC 33.7 32.0 - 36.0 g/dL   RDW 12.6 11.5 - 14.5 %   Platelets 342 150 - 440 K/uL   Neutrophils Relative % 79 %   Neutro Abs 3.3 1.4 - 6.5 K/uL   Lymphocytes Relative 16 %   Lymphs Abs 0.7 (L) 1.0 - 3.6 K/uL   Monocytes Relative 5 %   Monocytes Absolute 0.2 0.2 - 1.0 K/uL   Eosinophils Relative 0 %   Eosinophils Absolute 0.0 0 - 0.7 K/uL   Basophils Relative 0 %   Basophils Absolute 0.0 0 - 0.1 K/uL  Lipase, blood  Result Value Ref Range   Lipase 24 11 - 51 U/L  CBC  Result Value Ref Range   WBC 9.4 3.8 - 10.6 K/uL   RBC 4.18 (L) 4.40 - 5.90 MIL/uL   Hemoglobin 13.0 13.0 - 18.0 g/dL   HCT 36.7 (L) 40.0 - 52.0 %   MCV 87.7 80.0 - 100.0 fL   MCH 31.0 26.0 - 34.0 pg   MCHC 35.4 32.0 - 36.0 g/dL   RDW 13.3 11.5 - 14.5 %   Platelets 444 (H) 150 - 440 K/uL  Basic metabolic panel  Result Value Ref Range   Sodium 141 135 - 145 mmol/L   Potassium 4.2 3.5 - 5.1 mmol/L   Chloride 109 101 - 111 mmol/L   CO2 24 22 - 32 mmol/L   Glucose, Bld 318 (H) 65 - 99 mg/dL   BUN 20 6 - 20 mg/dL   Creatinine, Ser 0.93 0.61 - 1.24 mg/dL   Calcium 8.9 8.9 - 10.3 mg/dL   GFR calc non Af Amer >60 >60 mL/min   GFR calc Af Amer >60 >60 mL/min   Anion gap 8 5 - 15  CBC  Result Value Ref Range   WBC 10.7 (H) 3.8 - 10.6 K/uL   RBC 4.07 (L) 4.40 - 5.90 MIL/uL   Hemoglobin 12.5 (L) 13.0 - 18.0 g/dL   HCT 35.8 (L) 40.0 - 52.0 %   MCV 88.1 80.0 - 100.0 fL   MCH 30.8 26.0 - 34.0 pg   MCHC 35.0 32.0 - 36.0 g/dL   RDW 13.1 11.5 - 14.5 %   Platelets 523 (H) 150 - 440 K/uL  Assessment & Plan:   Problem List Items Addressed This Visit      Cardiovascular and Mediastinum   Essential hypertension, benign    Excellent control; try to follow DASH guidelines; limit salt; continue ARB        Endocrine   Type 2 diabetes mellitus ([place])    Foot exam by MD today, watch feet closely; check A1c; limit white bread, sugary drinks; eye exam UTD;  aspirin therapy      Relevant Orders   Microalbumin / creatinine urine ratio   Hemoglobin A1c     Hematopoietic and Hemostatic   Thrombocytosis (HCC)    On aspirin; check CBC today      Relevant Orders   CBC with Differential/Platelet     Other   Vertigo    So glad that's all that it was in August; followed up with Dr. Manuella Ghazi; asymptomatic now      Medication monitoring encounter    Monitor SGPT and Cr and K+ on current meds      Relevant Orders   Comprehensive metabolic panel   Hyperlipidemia    Continue statin; limit eggs to no more than 3 per week; limit saturated fats      Relevant Orders   Lipid panel   Anemia    Check CBC      Relevant Orders   CBC with Differential/Platelet    Other Visit Diagnoses   None.      Follow up plan: No Follow-up on file.  An after-visit summary was printed and given to the patient at [place].  Please see the patient instructions which may contain other information and recommendations beyond what is mentioned above in the assessment and plan.  No orders of the defined types were placed in this encounter.   Orders Placed This Encounter  Procedures  . Microalbumin / creatinine urine ratio  . Hemoglobin A1c  . Lipid panel  . Comprehensive metabolic panel  . CBC with Differential/Platelet

## 2015-12-14 NOTE — Assessment & Plan Note (Signed)
Excellent control; try to follow DASH guidelines; limit salt; continue ARB

## 2015-12-15 LAB — LIPID PANEL
CHOL/HDL RATIO: 2.7 ratio (ref 0.0–5.0)
Cholesterol, Total: 155 mg/dL (ref 100–199)
HDL: 57 mg/dL (ref >39)
LDL Calculated: 82 mg/dL (ref 0–99)
Triglycerides: 78 mg/dL (ref 0–149)
VLDL CHOLESTEROL CAL: 16 mg/dL (ref 5–40)

## 2015-12-15 LAB — COMPREHENSIVE METABOLIC PANEL
ALBUMIN: 4.6 g/dL (ref 3.6–4.8)
ALK PHOS: 68 IU/L (ref 39–117)
ALT: 18 IU/L (ref 0–44)
AST: 12 IU/L (ref 0–40)
Albumin/Globulin Ratio: 1.9 (ref 1.2–2.2)
BILIRUBIN TOTAL: 0.3 mg/dL (ref 0.0–1.2)
BUN/Creatinine Ratio: 24 (ref 10–24)
BUN: 24 mg/dL (ref 8–27)
CHLORIDE: 101 mmol/L (ref 96–106)
CO2: 22 mmol/L (ref 18–29)
CREATININE: 1 mg/dL (ref 0.76–1.27)
Calcium: 9.6 mg/dL (ref 8.6–10.2)
GFR calc Af Amer: 88 mL/min/{1.73_m2} (ref >59)
GFR calc non Af Amer: 76 mL/min/{1.73_m2} (ref >59)
GLUCOSE: 122 mg/dL — AB (ref 65–99)
Globulin, Total: 2.4 g/dL (ref 1.5–4.5)
Potassium: 4.7 mmol/L (ref 3.5–5.2)
Sodium: 141 mmol/L (ref 134–144)
TOTAL PROTEIN: 7 g/dL (ref 6.0–8.5)

## 2015-12-15 LAB — CBC WITH DIFFERENTIAL/PLATELET
BASOS ABS: 0 10*3/uL (ref 0.0–0.2)
Basos: 1 %
EOS (ABSOLUTE): 0.1 10*3/uL (ref 0.0–0.4)
EOS: 1 %
HEMATOCRIT: 32.5 % — AB (ref 37.5–51.0)
HEMOGLOBIN: 10.9 g/dL — AB (ref 12.6–17.7)
IMMATURE GRANS (ABS): 0 10*3/uL (ref 0.0–0.1)
IMMATURE GRANULOCYTES: 0 %
LYMPHS: 18 %
Lymphocytes Absolute: 1.4 10*3/uL (ref 0.7–3.1)
MCH: 30 pg (ref 26.6–33.0)
MCHC: 33.5 g/dL (ref 31.5–35.7)
MCV: 90 fL (ref 79–97)
MONOCYTES: 5 %
Monocytes Absolute: 0.4 10*3/uL (ref 0.1–0.9)
Neutrophils Absolute: 5.9 10*3/uL (ref 1.4–7.0)
Neutrophils: 75 %
Platelets: 588 10*3/uL — ABNORMAL HIGH (ref 150–379)
RBC: 3.63 x10E6/uL — AB (ref 4.14–5.80)
RDW: 13 % (ref 12.3–15.4)
WBC: 7.8 10*3/uL (ref 3.4–10.8)

## 2015-12-15 LAB — MICROALBUMIN / CREATININE URINE RATIO
CREATININE, UR: 194.1 mg/dL
MICROALBUM., U, RANDOM: 61.2 ug/mL
Microalb/Creat Ratio: 31.5 mg/g creat — ABNORMAL HIGH (ref 0.0–30.0)

## 2015-12-15 LAB — HEMOGLOBIN A1C
ESTIMATED AVERAGE GLUCOSE: 140 mg/dL
HEMOGLOBIN A1C: 6.5 % — AB (ref 4.8–5.6)

## 2015-12-21 ENCOUNTER — Telehealth: Payer: Self-pay | Admitting: Family Medicine

## 2015-12-21 DIAGNOSIS — D473 Essential (hemorrhagic) thrombocythemia: Secondary | ICD-10-CM

## 2015-12-21 DIAGNOSIS — D75839 Thrombocytosis, unspecified: Secondary | ICD-10-CM

## 2015-12-21 DIAGNOSIS — D649 Anemia, unspecified: Secondary | ICD-10-CM

## 2015-12-21 NOTE — Assessment & Plan Note (Signed)
Previously monitored by heme-onc; patient asked if I could follow; will recheck in 2-3 months

## 2015-12-21 NOTE — Telephone Encounter (Signed)
Patient has anemia; colonoscopy was done in 2014 I tried to call patient about lab results; left message

## 2015-12-21 NOTE — Telephone Encounter (Signed)
He used to see Dr. Ma Hillock; followed H/H and platelet count He will get back on iron pills; never found the source No PPIs Recheck CBC every 2-3 months; offered referral back to heme-onc and he'd prefer to have me follow up and then we can refer back if needed A1c shows good control; urine microalbumin:creatinine barely elevated; control sugar, limit red meat

## 2016-01-17 ENCOUNTER — Ambulatory Visit (INDEPENDENT_AMBULATORY_CARE_PROVIDER_SITE_OTHER): Payer: Medicare Other

## 2016-01-17 DIAGNOSIS — Z23 Encounter for immunization: Secondary | ICD-10-CM

## 2016-02-01 DIAGNOSIS — H34811 Central retinal vein occlusion, right eye, with macular edema: Secondary | ICD-10-CM | POA: Diagnosis not present

## 2016-03-14 ENCOUNTER — Other Ambulatory Visit: Payer: Self-pay | Admitting: Family Medicine

## 2016-03-14 MED ORDER — TAMSULOSIN HCL 0.4 MG PO CAPS: 0.4000 mg | ORAL_CAPSULE | Freq: Every day | ORAL | 0 refills | Status: DC

## 2016-03-14 NOTE — Telephone Encounter (Signed)
Patient requesting refill of Irbesartan, Metformin, Simvastatin and Tamsulosin to [facility].

## 2016-04-15 ENCOUNTER — Ambulatory Visit: Payer: Medicare Other | Admitting: Family Medicine

## 2016-04-18 DIAGNOSIS — H34811 Central retinal vein occlusion, right eye, with macular edema: Secondary | ICD-10-CM | POA: Diagnosis not present

## 2016-04-24 ENCOUNTER — Ambulatory Visit (INDEPENDENT_AMBULATORY_CARE_PROVIDER_SITE_OTHER): Payer: Medicare Other | Admitting: Family Medicine

## 2016-04-24 ENCOUNTER — Encounter: Payer: Self-pay | Admitting: Family Medicine

## 2016-04-24 DIAGNOSIS — Z794 Long term (current) use of insulin: Secondary | ICD-10-CM

## 2016-04-24 DIAGNOSIS — K219 Gastro-esophageal reflux disease without esophagitis: Secondary | ICD-10-CM

## 2016-04-24 DIAGNOSIS — I10 Essential (primary) hypertension: Secondary | ICD-10-CM

## 2016-04-24 DIAGNOSIS — E782 Mixed hyperlipidemia: Secondary | ICD-10-CM

## 2016-04-24 DIAGNOSIS — Z5181 Encounter for therapeutic drug level monitoring: Secondary | ICD-10-CM | POA: Diagnosis not present

## 2016-04-24 DIAGNOSIS — D649 Anemia, unspecified: Secondary | ICD-10-CM | POA: Diagnosis not present

## 2016-04-24 DIAGNOSIS — D473 Essential (hemorrhagic) thrombocythemia: Secondary | ICD-10-CM | POA: Diagnosis not present

## 2016-04-24 DIAGNOSIS — D75839 Thrombocytosis, unspecified: Secondary | ICD-10-CM

## 2016-04-24 DIAGNOSIS — E119 Type 2 diabetes mellitus without complications: Secondary | ICD-10-CM | POA: Diagnosis not present

## 2016-04-24 NOTE — Assessment & Plan Note (Signed)
Check CBC today.  

## 2016-04-24 NOTE — Patient Instructions (Addendum)
Please do see your eye doctor regularly, and have your eyes examined every year (or more often per his or her recommendation) Check your feet every night and let me know right away of any sores, infections, numbness, etc. Try to limit sweets, white bread, white rice, white potatoes It is okay with me for you to not check your fingerstick blood sugars (per [employer] of Endocrinology Best Practices), unless you are interested and feel it would be helpful for you Try to limit saturated fats in your diet (bologna, hot dogs, barbeque, cheeseburgers, hamburgers, steak, bacon, sausage, cheese, etc.) and get more fresh fruits, vegetables, and whole grains We'll get labs today and contact you Return on or just after April 30th for fasting labs for cholesterol and diabetes Try wrist/thumb brace (gently wrapped, not too tight), for a few weeks and call me if not improving Try to use PLAIN allergy medicine without the decongestant Avoid: phenylephrine, phenylpropanolamine, and pseudoephredine

## 2016-04-24 NOTE — Assessment & Plan Note (Signed)
Excellent control; try DASH guidelines

## 2016-04-24 NOTE — Assessment & Plan Note (Signed)
Did not tolerate iron pills; check CBC and ferritin; willing to see hematologist if needed

## 2016-04-24 NOTE — Assessment & Plan Note (Signed)
controlled 

## 2016-04-24 NOTE — Assessment & Plan Note (Signed)
Due in late April or May; limit saturated fats

## 2016-04-24 NOTE — Progress Notes (Signed)
BP 134/64   Pulse 84   Temp 98.4 F (36.9 C) (Oral)   Wt 131 lb 8 oz (59.6 kg)   SpO2 98%   BMI 19.99 kg/m    Subjective:    Patient ID: Garrett Spikes., male    DOB: 1945/05/20, 71 y.o.   MRN: [id number]  HPI: Garrett Gamino. is a 71 y.o. male  Chief Complaint  Patient presents with  . Follow-up    diabetes  . Medication Refill   Type 2 diabetes No problems with feet Right central retinal artery occlusion; treated by retinal specialist since 2015; has been getting injections; he's been following his eyes since then No diabetes in the family Lab Results  Component Value Date   HGBA1C 6.5 (H) 12/14/2015   High cholesterol; he does like bacon, but manages to avoid other fatty meats; some cheese, might cut down; some eggs Lab Results  Component Value Date   CHOL 155 12/14/2015   HDL 57 12/14/2015   LDLCALC 82 12/14/2015   TRIG 78 12/14/2015   CHOLHDL 2.7 12/14/2015   HTN; excellent control; checks away from doctor; runs 120s  Acid reflux; controlled now  Elevated platelets and anemia; he does not tolerate iron pills, GI symptoms; he did try; no SHOB; no blood in the stool  He has a list too; discussed iron; he did not tolerate that He is having neck and shoulder and hand pain; going on for months; pain in the neck that radiates into the shoulders, has a little doggie and tries to walk him on a leash; two speeds: zero and 100; constantly jerking his upper body and neck, thinks that has really contributed to it a lot; getting ready to do outside work; can hear a little crack in the neck when turning head to the left Hands; wonders if diabetes, right across the thumb on the left  Allergy problems; mostly sneezing; not much congestion; taking OTC generic zyrtec, normally does help; occasionally has a big sneeze  Depression screen [facility] 2/9 04/24/2016 12/14/2015 08/14/2015 11/20/2014  Decreased Interest 0 0 0 0  Down, Depressed, Hopeless 0 0 0 0  PHQ - 2 Score 0 0 0 0    Relevant past medical, surgical, family and social history reviewed Past Medical History:  Diagnosis Date  . Anemia 08/14/2015  . Anxiety   . Depression   . Diabetes mellitus without complication ([place])   . GERD (gastroesophageal reflux disease)   . Hyperlipidemia   . Hypertension   . Thrombocytosis ([place]) 08/14/2015   Past Surgical History:  Procedure Laterality Date  . CATARACT EXTRACTION    . RETINAL LASER PROCEDURE     Family History  Problem Relation Age of Onset  . Stroke Mother   . Diabetes Mellitus II Father   . Diabetes Sister   . Cancer Maternal Grandfather   . Stroke Paternal Grandmother    Social History  Substance Use Topics  . Smoking status: Former Research scientist (life sciences)  . Smokeless tobacco: Never Used  . Alcohol use No   Interim medical history since last visit reviewed. Allergies and medications reviewed  Review of Systems Per HPI unless specifically indicated above     Objective:    BP 134/64   Pulse 84   Temp 98.4 F (36.9 C) (Oral)   Wt 131 lb 8 oz (59.6 kg)   SpO2 98%   BMI 19.99 kg/m   Wt Readings from Last 3 Encounters:  04/24/16 131 lb 8 oz (  59.6 kg)  12/14/15 131 lb (59.4 kg)  10/06/15 119 lb 1.6 oz (54 kg)    Physical Exam  Constitutional: He appears well-developed and well-nourished. No distress.  HENT:  Head: Normocephalic and atraumatic.  Eyes: EOM are normal. No scleral icterus.  Neck: No thyromegaly present.  Cardiovascular: Normal rate and regular rhythm.   Pulmonary/Chest: Effort normal and breath sounds normal.  Abdominal: Soft. Bowel sounds are normal. He exhibits no distension.  Musculoskeletal: He exhibits no edema.  Neurological: He is alert.  Skin: Skin is warm and dry. No pallor.  Psychiatric: He has a normal mood and affect. His behavior is normal. Judgment and thought content normal.   Diabetic Foot Form - Detailed   Diabetic Foot Exam - detailed Diabetic Foot exam was performed with the following findings:  Yes 04/24/2016   2:20 PM  Visual Foot Exam completed.:  Yes  Are the toenails ingrown?:  No Normal Range of Motion:  Yes Pulse Foot Exam completed.:  Yes  Right Dorsalis Pedis:  Present Left Dorsalis Pedis:  Present  Sensory Foot Exam Completed.:  Yes Swelling:  No Semmes-Weinstein Monofilament Test R Site 1-Great Toe:  Pos L Site 1-Great Toe:  Pos  R Site 4:  Pos L Site 4:  Pos  R Site 5:  Pos L Site 5:  Pos          Assessment & Plan:   Problem List Items Addressed This Visit      Cardiovascular and Mediastinum   Essential hypertension, benign    Excellent control; try DASH guidelines        Digestive   Acid reflux    controlled        Endocrine   Type 2 diabetes mellitus ([place])    Foot exam by MD today; last A1c was at goal; next A1c due on or after June 14, 2016      Relevant Orders   Hemoglobin A1c     Hematopoietic and Hemostatic   Thrombocytosis (HCC)    Check CBC today      Relevant Orders   CBC with Differential/Platelet (Completed)   Ferritin (Completed)     Other   Medication monitoring encounter    Check labs in late April or May      Relevant Orders   COMPLETE METABOLIC PANEL WITH GFR   Hyperlipidemia    Due in late April or May; limit saturated fats      Relevant Orders   Lipid panel   Anemia    Did not tolerate iron pills; check CBC and ferritin; willing to see hematologist if needed      Relevant Orders   CBC with Differential/Platelet (Completed)   Ferritin (Completed)      Follow up plan: Return in about 8 weeks (around 06/16/2016) for fasting labs, April 30th or just after; October 30th or later with Dr. Sanda Klein.  An after-visit summary was printed and given to the patient at [place].  Please see the patient instructions which may contain other information and recommendations beyond what is mentioned above in the assessment and plan.  Meds ordered this encounter  Medications  . SIMBRINZA 1-0.2 % SUSP    Orders Placed This Encounter    Procedures  . CBC with Differential/Platelet  . Ferritin  . Hemoglobin A1c  . Lipid panel  . COMPLETE METABOLIC PANEL WITH GFR

## 2016-04-24 NOTE — Assessment & Plan Note (Signed)
Check labs in late April or May

## 2016-04-24 NOTE — Assessment & Plan Note (Addendum)
Foot exam by MD today; last A1c was at goal; next A1c due on or after June 14, 2016

## 2016-04-25 ENCOUNTER — Telehealth: Payer: Self-pay | Admitting: Family Medicine

## 2016-04-25 LAB — CBC WITH DIFFERENTIAL/PLATELET
BASOS ABS: 0 10*3/uL (ref 0.0–0.2)
Basos: 1 %
EOS (ABSOLUTE): 0.2 10*3/uL (ref 0.0–0.4)
Eos: 4 %
Hematocrit: 34 % — ABNORMAL LOW (ref 37.5–51.0)
Hemoglobin: 11.1 g/dL — ABNORMAL LOW (ref 13.0–17.7)
IMMATURE GRANS (ABS): 0 10*3/uL (ref 0.0–0.1)
Immature Granulocytes: 0 %
LYMPHS: 30 %
Lymphocytes Absolute: 1.7 10*3/uL (ref 0.7–3.1)
MCH: 29.4 pg (ref 26.6–33.0)
MCHC: 32.6 g/dL (ref 31.5–35.7)
MCV: 90 fL (ref 79–97)
Monocytes Absolute: 0.5 10*3/uL (ref 0.1–0.9)
Monocytes: 8 %
NEUTROS ABS: 3.4 10*3/uL (ref 1.4–7.0)
Neutrophils: 57 %
Platelets: 486 10*3/uL — ABNORMAL HIGH (ref 150–379)
RBC: 3.78 x10E6/uL — AB (ref 4.14–5.80)
RDW: 13.8 % (ref 12.3–15.4)
WBC: 5.8 10*3/uL (ref 3.4–10.8)

## 2016-04-25 LAB — FERRITIN: FERRITIN: 72 ng/mL (ref 30–400)

## 2016-04-25 NOTE — Telephone Encounter (Signed)
Reviewed previous labs from 2014, 2015; had seen hematologist for years; platelets elev, Hgb mildly low; had colonoscopy in there in 2014; will continue to follow every 3-6 months if patient does not want to see heme; okay

## 2016-06-05 DIAGNOSIS — H2512 Age-related nuclear cataract, left eye: Secondary | ICD-10-CM | POA: Diagnosis not present

## 2016-06-05 DIAGNOSIS — H401132 Primary open-angle glaucoma, bilateral, moderate stage: Secondary | ICD-10-CM | POA: Diagnosis not present

## 2016-06-05 LAB — HM DIABETES EYE EXAM

## 2016-06-13 ENCOUNTER — Other Ambulatory Visit: Payer: Self-pay | Admitting: Family Medicine

## 2016-06-18 ENCOUNTER — Other Ambulatory Visit: Payer: Self-pay

## 2016-06-18 DIAGNOSIS — Z794 Long term (current) use of insulin: Principal | ICD-10-CM

## 2016-06-18 DIAGNOSIS — E782 Mixed hyperlipidemia: Secondary | ICD-10-CM

## 2016-06-18 DIAGNOSIS — Z5181 Encounter for therapeutic drug level monitoring: Secondary | ICD-10-CM

## 2016-06-18 DIAGNOSIS — E119 Type 2 diabetes mellitus without complications: Secondary | ICD-10-CM

## 2016-06-18 MED ORDER — TAMSULOSIN HCL 0.4 MG PO CAPS: 0.4000 mg | ORAL_CAPSULE | Freq: Every day | ORAL | 0 refills | Status: DC

## 2016-06-18 MED ORDER — METFORMIN HCL 1000 MG PO TABS: ORAL_TABLET | ORAL | 0 refills | Status: DC

## 2016-06-18 MED ORDER — SIMVASTATIN 40 MG PO TABS: 40.0000 mg | ORAL_TABLET | Freq: Every day | ORAL | 0 refills | Status: DC

## 2016-06-18 MED ORDER — IRBESARTAN 150 MG PO TABS: 150.0000 mg | ORAL_TABLET | Freq: Every day | ORAL | 0 refills | Status: DC

## 2016-06-18 NOTE — Telephone Encounter (Signed)
Left detailed vocieamail

## 2016-06-18 NOTE — Telephone Encounter (Signed)
Patient requesting refill of Irbesartan, Simvastatin, Tamsulosin and Metformin to [facility].

## 2016-06-18 NOTE — Telephone Encounter (Signed)
I'll send refills Please ask patient to have labs done tomorrow or next week (fasting) Thank you

## 2016-07-23 ENCOUNTER — Ambulatory Visit: Payer: Medicare Other | Admitting: Family Medicine

## 2016-07-24 ENCOUNTER — Other Ambulatory Visit: Payer: Self-pay | Admitting: Family Medicine

## 2016-07-25 LAB — LIPID PANEL WITH LDL/HDL RATIO
Cholesterol, Total: 155 mg/dL (ref 100–199)
HDL: 62 mg/dL (ref >39)
LDL Calculated: 79 mg/dL (ref 0–99)
LDL/HDL RATIO: 1.3 ratio (ref 0.0–3.6)
Triglycerides: 72 mg/dL (ref 0–149)
VLDL CHOLESTEROL CAL: 14 mg/dL (ref 5–40)

## 2016-07-25 LAB — COMPREHENSIVE METABOLIC PANEL
A/G RATIO: 2.1 (ref 1.2–2.2)
ALBUMIN: 4.9 g/dL — AB (ref 3.5–4.8)
ALT: 15 IU/L (ref 0–44)
AST: 18 IU/L (ref 0–40)
Alkaline Phosphatase: 64 IU/L (ref 39–117)
BUN / CREAT RATIO: 23 (ref 10–24)
BUN: 26 mg/dL (ref 8–27)
Bilirubin Total: 0.3 mg/dL (ref 0.0–1.2)
CALCIUM: 10.1 mg/dL (ref 8.6–10.2)
CO2: 21 mmol/L (ref 18–29)
Chloride: 101 mmol/L (ref 96–106)
Creatinine, Ser: 1.12 mg/dL (ref 0.76–1.27)
GFR, EST AFRICAN AMERICAN: 77 mL/min/{1.73_m2} (ref >59)
GFR, EST NON AFRICAN AMERICAN: 66 mL/min/{1.73_m2} (ref >59)
Globulin, Total: 2.3 g/dL (ref 1.5–4.5)
Glucose: 121 mg/dL — ABNORMAL HIGH (ref 65–99)
POTASSIUM: 4.9 mmol/L (ref 3.5–5.2)
Sodium: 140 mmol/L (ref 134–144)
TOTAL PROTEIN: 7.2 g/dL (ref 6.0–8.5)

## 2016-07-25 LAB — HGB A1C W/O EAG: HEMOGLOBIN A1C: 7.5 % — AB (ref 4.8–5.6)

## 2016-07-30 ENCOUNTER — Encounter: Payer: Self-pay | Admitting: Family Medicine

## 2016-07-30 ENCOUNTER — Telehealth: Payer: Self-pay

## 2016-07-30 ENCOUNTER — Ambulatory Visit (INDEPENDENT_AMBULATORY_CARE_PROVIDER_SITE_OTHER): Payer: Medicare Other | Admitting: Family Medicine

## 2016-07-30 VITALS — BP 130/62 | HR 80 | Temp 98.2°F | Resp 14 | Wt 125.0 lb

## 2016-07-30 DIAGNOSIS — E119 Type 2 diabetes mellitus without complications: Secondary | ICD-10-CM

## 2016-07-30 DIAGNOSIS — M654 Radial styloid tenosynovitis [de Quervain]: Secondary | ICD-10-CM | POA: Diagnosis not present

## 2016-07-30 DIAGNOSIS — G8929 Other chronic pain: Secondary | ICD-10-CM | POA: Diagnosis not present

## 2016-07-30 DIAGNOSIS — M25512 Pain in left shoulder: Secondary | ICD-10-CM

## 2016-07-30 DIAGNOSIS — Z794 Long term (current) use of insulin: Secondary | ICD-10-CM

## 2016-07-30 DIAGNOSIS — M25511 Pain in right shoulder: Secondary | ICD-10-CM

## 2016-07-30 MED ORDER — PREDNISONE 10 MG PO TABS: 30.0000 mg | ORAL_TABLET | Freq: Every day | ORAL | 0 refills | Status: AC

## 2016-07-30 MED ORDER — INSULIN DETEMIR 100 UNIT/ML FLEXPEN
33.0000 [IU] | PEN_INJECTOR | Freq: Every morning | SUBCUTANEOUS | 1 refills | Status: DC
Start: 2016-07-30 — End: 2016-11-11

## 2016-07-30 NOTE — Patient Instructions (Addendum)
Increase the insulin to thirty-three units once a day Monitor sugars and call me in one week if not consistently under 140-150 fasting Check a few post-prandial sugars and let me know if it goes over 180 (between 1.5 to 2 hours after you start your meal) Start the prednisone 30 mg daily for five days; take with food If you need something for aches or pains, try to use Tylenol (acetaminophen) instead of non-steroidals (which include Aleve, ibuprofen, Advil, Motrin, and naproxen); non-steroidals can cause long-term kidney damage and should not be taken with prednisone Let me know if the wrist/thumb does not get better and we'll have you see an orthopaedist If you develop neurologic symptoms, call 911 and get to an emergency department

## 2016-07-30 NOTE — Progress Notes (Signed)
BP 130/62   Pulse 80   Temp 98.2 F (36.8 C) (Oral)   Resp 14   Wt 125 lb (56.7 kg)   SpO2 98%   BMI 19.01 kg/m    Subjective:    Patient ID: Garrett Spikes., male    DOB: 10-30-1945, 71 y.o.   MRN: [id number]  HPI: Garrett Siler. is a 71 y.o. male  Chief Complaint  Patient presents with  . Shoulder Pain    Both shoulders; happening for awhile but became worst over the past couple of months. Pain starts in shoulder up to his neck which the pain becames worst.   . Neck Pain    HPI Pain in the left thumb Over the extensor tendons on the left side Brace does not help one bit Just recently, it feels a little numb; running up the arm a little more at this point  Also having pain in the shoulders and neck; really getting worse Used to be just occasional Pain in both shoulders across the back, then up the neck Not often, but he has noticed a little bit off balance, depth perception is off No weakness of face or arms  Type 2 diabetes; A1c last week was 7.5; up from 6.5; diet had not changed much, some more ice cream Already on metofrmin 1000 mg BID On levemir 30 units  Depression screen [facility] 2/9 04/24/2016 12/14/2015 08/14/2015 11/20/2014  Decreased Interest 0 0 0 0  Down, Depressed, Hopeless 0 0 0 0  PHQ - 2 Score 0 0 0 0    Relevant past medical, surgical, family and social history reviewed Past Medical History:  Diagnosis Date  . Anemia 08/14/2015  . Anxiety   . Depression   . Diabetes mellitus without complication ([place])   . GERD (gastroesophageal reflux disease)   . Hyperlipidemia   . Hypertension   . Thrombocytosis ([place]) 08/14/2015   Past Surgical History:  Procedure Laterality Date  . CATARACT EXTRACTION    . RETINAL LASER PROCEDURE     Family History  Problem Relation Age of Onset  . Stroke Mother   . Diabetes Mellitus II Father   . Diabetes Sister   . Cancer Maternal Grandfather   . Stroke Paternal Grandmother    Social History   Social  History  . Marital status: Married    Spouse name: N/A  . Number of children: N/A  . Years of education: N/A   Occupational History  . retired    Social History Main Topics  . Smoking status: Former Research scientist (life sciences)  . Smokeless tobacco: Never Used  . Alcohol use No  . Drug use: No  . Sexual activity: Yes    Partners: Female   Other Topics Concern  . Not on file   Social History Narrative  . No narrative on file    Interim medical history since last visit reviewed. Allergies and medications reviewed  Review of Systems Per HPI unless specifically indicated above     Objective:    BP 130/62   Pulse 80   Temp 98.2 F (36.8 C) (Oral)   Resp 14   Wt 125 lb (56.7 kg)   SpO2 98%   BMI 19.01 kg/m   Wt Readings from Last 3 Encounters:  07/30/16 125 lb (56.7 kg)  04/24/16 131 lb 8 oz (59.6 kg)  12/14/15 131 lb (59.4 kg)    Physical Exam  Constitutional: He appears well-developed and well-nourished. No distress.  Weight loss 5+  pounds over last 3+ months  HENT:  Head: Normocephalic and atraumatic.  Eyes: EOM are normal. No scleral icterus.  Neck: Neck supple. No JVD present. Carotid bruit is not present. No thyromegaly present.  Cardiovascular: Normal rate and regular rhythm.   Pulmonary/Chest: Effort normal and breath sounds normal. No respiratory distress. He has no wheezes.  Abdominal: Soft. Bowel sounds are normal. He exhibits no distension.  Musculoskeletal: He exhibits no edema.  Neurological: He is alert.  Skin: Skin is warm and dry. No pallor.  Psychiatric: He has a normal mood and affect. His behavior is normal. Judgment and thought content normal. His mood appears not anxious. He does not exhibit a depressed mood.   Diabetic Foot Form - Detailed   Diabetic Foot Exam - detailed Diabetic Foot exam was performed with the following findings:  Yes 07/30/2016 10:45 AM  Visual Foot Exam completed.:  Yes  Are the toenails ingrown?:  No Normal Range of Motion:   Yes Pulse Foot Exam completed.:  Yes  Right Dorsalis Pedis:  Present Left Dorsalis Pedis:  Present  Sensory Foot Exam Completed.:  Yes Swelling:  No Semmes-Weinstein Monofilament Test R Site 1-Great Toe:  Pos L Site 1-Great Toe:  Pos  R Site 4:  Pos L Site 4:  Pos  R Site 5:  Pos L Site 5:  Pos           Assessment & Plan:   Problem List Items Addressed This Visit      Endocrine   Type 2 diabetes mellitus ([place])    Foot exam by MD today      Relevant Medications   Insulin Detemir (LEVEMIR FLEXTOUCH) 100 UNIT/ML Pen    Other Visit Diagnoses    Chronic pain of both shoulders    -  Primary   concerning for possible PMR; will get labs today; start prednisone; keep me posted   Relevant Orders   Sed Rate (ESR) (Completed)   ANA,IFA RA Diag Pnl w/rflx Tit/Patn (Completed)   C-reactive protein (Completed)   De Quervain's tenosynovitis, left       explained dx; will be treating with prednisone for possible PMR; expect sx to improve; if not, can refer to ortho for consideration of injection       Follow up plan: Return in about 2 weeks (around 08/13/2016) for follow-up visit with Dr. Sanda Klein.  An after-visit summary was printed and given to the patient at [place].  Please see the patient instructions which may contain other information and recommendations beyond what is mentioned above in the assessment and plan.  Meds ordered this encounter  Medications  . predniSONE (DELTASONE) 10 MG tablet    Sig: Take 3 tablets (30 mg total) by mouth daily with breakfast.    Dispense:  15 tablet    Refill:  0  . Insulin Detemir (LEVEMIR FLEXTOUCH) 100 UNIT/ML Pen    Sig: Inject 33 Units into the skin every morning.    Dispense:  45 mL    Refill:  1    Orders Placed This Encounter  Procedures  . Sed Rate (ESR)  . ANA,IFA RA Diag Pnl w/rflx Tit/Patn  . C-reactive protein  . FANA Staining Patterns

## 2016-07-30 NOTE — Telephone Encounter (Signed)
Rx went to the wrong pharmacy. Pt asked for his prednisone to go to [facility] off [address] and his levemir to be canceled from . Pt states he has enough of levemir and do not need it. [facility] and [facility].

## 2016-08-01 ENCOUNTER — Telehealth: Payer: Self-pay | Admitting: Family Medicine

## 2016-08-01 NOTE — Telephone Encounter (Signed)
I'll send another MyChart message

## 2016-08-01 NOTE — Telephone Encounter (Signed)
PT SAID DR LADA SENT HIM A MESSAGE ON MY CHART. HE DOES NOT UNDERSTAND WHAT IT IS TRYING TO TELL HIM. HE DOES NOT UNDERSTAND THE PART ABOUT STAY THE COURSE.

## 2016-08-02 LAB — ANA,IFA RA DIAG PNL W/RFLX TIT/PATN
ANA TITER 1: POSITIVE — AB
Cyclic Citrullin Peptide Ab: 6 units (ref 0–19)
Rhuematoid fact SerPl-aCnc: <10 IU/mL (ref 0.0–13.9)

## 2016-08-02 LAB — C-REACTIVE PROTEIN: CRP: <0.3 mg/L (ref 0.0–4.9)

## 2016-08-02 LAB — FANA STAINING PATTERNS: Homogeneous Pattern: 1:80 {titer}

## 2016-08-02 LAB — SEDIMENTATION RATE: Sed Rate: 2 mm/hr (ref 0–30)

## 2016-08-04 ENCOUNTER — Other Ambulatory Visit: Payer: Self-pay | Admitting: Family Medicine

## 2016-08-04 ENCOUNTER — Telehealth: Payer: Self-pay

## 2016-08-04 ENCOUNTER — Encounter: Payer: Self-pay | Admitting: Family Medicine

## 2016-08-04 MED ORDER — NAPROXEN 375 MG PO TABS
375.0000 mg | ORAL_TABLET | Freq: Two times a day (BID) | ORAL | 0 refills | Status: DC
Start: 2016-08-04 — End: 2016-08-21

## 2016-08-04 NOTE — Telephone Encounter (Signed)
Patient called states he never received another email.  He was confused about the reading "stay the course"  He states the first 3 days on prednisone he was out of pain, but on the 3rd and fourth day pain was back.  Please call patient or email back

## 2016-08-04 NOTE — Assessment & Plan Note (Signed)
Foot exam by MD today 

## 2016-08-04 NOTE — Progress Notes (Signed)
Start naproxen after finishing prednisone; see mychart message

## 2016-08-04 NOTE — Telephone Encounter (Signed)
I'll send MyChart message I already wrote to him thi smorning about lab results

## 2016-08-05 ENCOUNTER — Telehealth: Payer: Self-pay | Admitting: Family Medicine

## 2016-08-05 NOTE — Telephone Encounter (Signed)
The prescription naproxen was sent to his mail order which means he will not get it for a week. Please cancel that order and send it to [facility] [address]

## 2016-08-05 NOTE — Telephone Encounter (Signed)
RX for Naproxen has been canceled through [employer] service  by [employer] tech rep. Spoke with a pharmacist   at [facility] and did a verbal for pt naproxen.

## 2016-08-21 ENCOUNTER — Other Ambulatory Visit: Payer: Self-pay | Admitting: Family Medicine

## 2016-08-21 ENCOUNTER — Ambulatory Visit (INDEPENDENT_AMBULATORY_CARE_PROVIDER_SITE_OTHER): Payer: Medicare Other | Admitting: Family Medicine

## 2016-08-21 ENCOUNTER — Encounter: Payer: Self-pay | Admitting: Family Medicine

## 2016-08-21 ENCOUNTER — Ambulatory Visit
Admission: RE | Admit: 2016-08-21 | Discharge: 2016-08-21 | Disposition: A | Discharge status: Home / Self Care | Payer: Medicare Other | Source: Ambulatory Visit | Attending: Family Medicine | Admitting: Family Medicine

## 2016-08-21 DIAGNOSIS — M542 Cervicalgia: Secondary | ICD-10-CM | POA: Diagnosis present

## 2016-08-21 DIAGNOSIS — I10 Essential (primary) hypertension: Secondary | ICD-10-CM | POA: Diagnosis not present

## 2016-08-21 DIAGNOSIS — M50323 Other cervical disc degeneration at C6-C7 level: Secondary | ICD-10-CM | POA: Insufficient documentation

## 2016-08-21 DIAGNOSIS — E119 Type 2 diabetes mellitus without complications: Secondary | ICD-10-CM | POA: Diagnosis not present

## 2016-08-21 DIAGNOSIS — M5030 Other cervical disc degeneration, unspecified cervical region: Secondary | ICD-10-CM

## 2016-08-21 DIAGNOSIS — M654 Radial styloid tenosynovitis [de Quervain]: Secondary | ICD-10-CM

## 2016-08-21 DIAGNOSIS — G8929 Other chronic pain: Secondary | ICD-10-CM | POA: Diagnosis not present

## 2016-08-21 DIAGNOSIS — M47812 Spondylosis without myelopathy or radiculopathy, cervical region: Secondary | ICD-10-CM

## 2016-08-21 DIAGNOSIS — Z794 Long term (current) use of insulin: Secondary | ICD-10-CM

## 2016-08-21 HISTORY — DX: Other cervical disc degeneration, unspecified cervical region: M50.30

## 2016-08-21 HISTORY — DX: Spondylosis without myelopathy or radiculopathy, cervical region: M47.812

## 2016-08-21 MED ORDER — TRAMADOL HCL 50 MG PO TABS: 50.0000 mg | ORAL_TABLET | Freq: Four times a day (QID) | ORAL | 0 refills | Status: DC | PRN

## 2016-08-21 MED ORDER — OMEPRAZOLE 20 MG PO CPDR: 20.0000 mg | DELAYED_RELEASE_CAPSULE | Freq: Every day | ORAL | 3 refills | Status: DC

## 2016-08-21 MED ORDER — PREDNISONE 10 MG PO TABS: 30.0000 mg | ORAL_TABLET | Freq: Every day | ORAL | 0 refills | Status: DC

## 2016-08-21 NOTE — Assessment & Plan Note (Signed)
Refer to ortho.

## 2016-08-21 NOTE — Progress Notes (Signed)
BP (!) 154/72   Pulse 79   Temp 98.3 F (36.8 C) (Oral)   Resp 14   Wt 125 lb 6.4 oz (56.9 kg)   SpO2 98%   BMI 19.07 kg/m    Subjective:    Patient ID: Garrett Yoder., male    DOB: 25-Mar-1945, 71 y.o.   MRN: [id number]  HPI: Garrett Yoder. is a 71 y.o. male  Chief Complaint  Patient presents with  . Follow-up    HPI Patient is here for f/u He had chronic pain of both shoulders and neck; not actually as far as the shoulders he clarifies; mostly neck and trap area but stops at about the area of the distal clavicles He took the prednisone for five days; the first 3 days he was completely out of pain; no pain whatsoever; by day 4 , the pain started to return; driving made the shoulder pain intense; five year-old granddaughter had a dance recital in [place] and he couldn't make it; had to go home; by day 5, he was back in pain; now on the naproxen, that does not stop the pain; may be lessened a little but does not stop the pain; has not tested it with any long drives Still having that pain going across the shoulders Comes on later in the day; hits the back of the neck and never goes up very far at all; not too much of a problem Pain quality and location have not changed since the last visit No belly pain and no blood in stool on prednisone  Also pain in the left thumb, base and goes up the forearm sometimes  Type 2 diabetes mellitus; blood sugars did not rise significantly on the prednisone; he checks and gets pre- and post-prandial readings; we reviewed those today  Weight loss 5 pounds over previous 3 months noted at last visit; no further weight loss  He is going to have a colonoscopy with Dr. Gustavo Lah in October  He has been monitoring his BP at home, numbers in the 120 and 130s  Depression screen [facility] 2/9 08/21/2016 04/24/2016 12/14/2015 08/14/2015 11/20/2014  Decreased Interest 0 0 0 0 0  Down, Depressed, Hopeless 0 0 0 0 0  PHQ - 2 Score 0 0 0 0 0   Relevant  past medical, surgical, family and social history reviewed Past Medical History:  Diagnosis Date  . Anemia 08/14/2015  . Anxiety   . Depression   . Diabetes mellitus without complication ([place])   . GERD (gastroesophageal reflux disease)   . Hyperlipidemia   . Hypertension   . Thrombocytosis ([place]) 08/14/2015   Past Surgical History:  Procedure Laterality Date  . CATARACT EXTRACTION    . RETINAL LASER PROCEDURE     Family History  Problem Relation Age of Onset  . Stroke Mother   . Diabetes Mellitus II Father   . Diabetes Sister   . Cancer Maternal Grandfather   . Stroke Paternal Grandmother    Social History   Social History  . Marital status: Married    Spouse name: N/A  . Number of children: N/A  . Years of education: N/A   Occupational History  . retired    Social History Main Topics  . Smoking status: Former Research scientist (life sciences)  . Smokeless tobacco: Never Used  . Alcohol use No  . Drug use: No  . Sexual activity: Yes    Partners: Female   Other Topics Concern  . Not on file  Social History Narrative  . No narrative on file    Interim medical history since last visit reviewed. Allergies and medications reviewed  Review of Systems Per HPI unless specifically indicated above     Objective:    BP (!) 154/72   Pulse 79   Temp 98.3 F (36.8 C) (Oral)   Resp 14   Wt 125 lb 6.4 oz (56.9 kg)   SpO2 98%   BMI 19.07 kg/m   Wt Readings from Last 3 Encounters:  08/21/16 125 lb 6.4 oz (56.9 kg)  07/30/16 125 lb (56.7 kg)  04/24/16 131 lb 8 oz (59.6 kg)    Physical Exam  Constitutional: He appears well-developed and well-nourished. No distress.  Eyes: No scleral icterus.  Cardiovascular: Normal rate and regular rhythm.   Pulmonary/Chest: Effort normal and breath sounds normal.  Musculoskeletal:       Right shoulder: He exhibits normal range of motion and no bony tenderness.       Left shoulder: He exhibits normal range of motion and no bony tenderness.  Positive  Finkelstein's test on the LEFT; no erythema, no ulnar deviation of MCPs, fingers of either hand  Neurological: He is alert.  Psychiatric: He has a normal mood and affect.   Patient declined foot exam today  Results for orders placed or performed in visit on 07/30/16  Sed Rate (ESR)  Result Value Ref Range   Sed Rate 2 0 - 30 mm/hr  ANA,IFA RA Diag Pnl w/rflx Tit/Patn  Result Value Ref Range   ANA Titer 1 Positive (A)    Rhuematoid fact SerPl-aCnc <10.0 0.0 - 73.7 IU/mL   Cyclic Citrullin Peptide Ab 6 0 - 19 units  C-reactive protein  Result Value Ref Range   CRP <0.3 0.0 - 4.9 mg/L  FANA Staining Patterns  Result Value Ref Range   Homogeneous Pattern 1:80    Note: Comment       Assessment & Plan:   Problem List Items Addressed This Visit      Cardiovascular and Mediastinum   Essential hypertension, benign    Much better control at home; monitor while on prednisone        Endocrine   Type 2 diabetes mellitus ([place])    Patient declined foot exam today; watch sugars carefully on prednisone, expect them to rise, call if any problems        Other   Neck pain, chronic    Will get xrays of the cervical spine and follow with MRI and ortho referral; patient had complete relief with prednisone; symptoms so suggestive of PMR but normal CRP and normal sed rate; his pain is significantly affecting his life; will therefore put him back on prednisone short-term while working this up; discussed risks, including stomach ulcers; discussed starting PPI to help prevent that, reasons to seek immediate help in ER if any stomach pain or bleeding; no toher NSAIDs while on prednisone; never stop abruptly, will taper in a few weeks; he agrees with above      Relevant Medications   predniSONE (DELTASONE) 10 MG tablet   traMADol (ULTRAM) 50 MG tablet   Other Relevant Orders   DG Cervical Spine Complete (Completed)      Follow up plan: No Follow-up on file.  An after-visit summary was  printed and given to the patient at [facility].  Please see the patient instructions which may contain other information and recommendations beyond what is mentioned above in the assessment and plan.  Meds ordered  this encounter  Medications  . bimatoprost (LUMIGAN) 0.01 % SOLN    Sig: 1 drop at bedtime.  . predniSONE (DELTASONE) 10 MG tablet    Sig: Take 3 tablets (30 mg total) by mouth daily with breakfast. Never stop abruptly    Dispense:  90 tablet    Refill:  0  . omeprazole (PRILOSEC) 20 MG capsule    Sig: Take 1 capsule (20 mg total) by mouth daily.    Dispense:  30 capsule    Refill:  3  . traMADol (ULTRAM) 50 MG tablet    Sig: Take 1 tablet (50 mg total) by mouth every 6 (six) hours as needed.    Dispense:  20 tablet    Refill:  0    Orders Placed This Encounter  Procedures  . DG Cervical Spine Complete

## 2016-08-21 NOTE — Patient Instructions (Addendum)
Never mix pain pill with any alcohol or anxiety medicine or sleeping pill Start up on the prednisone, 30 mg daily Never stop that abruptly; we will taper that when the time comes Use the pain medicine as needed per directions Watch your sugar carefully with the prednisone Start omeprazole to help prevent an ulcer Notify me right away or go to the emergency department if you develop abdominal pain, dark stools, or see blood in your stools Have the xrays done today across the street We'll either proceed with an MRI or have you see a specialist (rheumatology versus orthopaedist) Keep me posted and please call me in 2 weeks, sooner if needed

## 2016-08-21 NOTE — Progress Notes (Signed)
Refer to ortho.

## 2016-08-21 NOTE — Assessment & Plan Note (Signed)
Much better control at home; monitor while on prednisone

## 2016-08-21 NOTE — Assessment & Plan Note (Signed)
Patient declined foot exam today; watch sugars carefully on prednisone, expect them to rise, call if any problems

## 2016-08-21 NOTE — Assessment & Plan Note (Addendum)
Will get xrays of the cervical spine and follow with MRI and ortho referral; patient had complete relief with prednisone; symptoms so suggestive of PMR but normal CRP and normal sed rate; his pain is significantly affecting his life; will therefore put him back on prednisone short-term while working this up; discussed risks, including stomach ulcers; discussed starting PPI to help prevent that, reasons to seek immediate help in ER if any stomach pain or bleeding; no toher NSAIDs while on prednisone; never stop abruptly, will taper in a few weeks; he agrees with above

## 2016-08-28 ENCOUNTER — Telehealth: Payer: Self-pay | Admitting: Family Medicine

## 2016-08-28 ENCOUNTER — Emergency Department
Admission: EM | Admit: 2016-08-28 | Discharge: 2016-08-28 | Disposition: A | Discharge status: Home / Self Care | Payer: Medicare Other | Attending: Emergency Medicine | Admitting: Emergency Medicine

## 2016-08-28 ENCOUNTER — Emergency Department: Payer: Medicare Other

## 2016-08-28 DIAGNOSIS — M542 Cervicalgia: Secondary | ICD-10-CM | POA: Diagnosis not present

## 2016-08-28 DIAGNOSIS — M50121 Cervical disc disorder at C4-C5 level with radiculopathy: Secondary | ICD-10-CM | POA: Insufficient documentation

## 2016-08-28 DIAGNOSIS — I959 Hypotension, unspecified: Secondary | ICD-10-CM | POA: Diagnosis not present

## 2016-08-28 DIAGNOSIS — E11649 Type 2 diabetes mellitus with hypoglycemia without coma: Secondary | ICD-10-CM | POA: Insufficient documentation

## 2016-08-28 DIAGNOSIS — R42 Dizziness and giddiness: Secondary | ICD-10-CM | POA: Diagnosis present

## 2016-08-28 DIAGNOSIS — Z7982 Long term (current) use of aspirin: Secondary | ICD-10-CM | POA: Insufficient documentation

## 2016-08-28 DIAGNOSIS — Z79899 Other long term (current) drug therapy: Secondary | ICD-10-CM | POA: Insufficient documentation

## 2016-08-28 DIAGNOSIS — M5020 Other cervical disc displacement, unspecified cervical region: Secondary | ICD-10-CM

## 2016-08-28 DIAGNOSIS — Z794 Long term (current) use of insulin: Secondary | ICD-10-CM | POA: Diagnosis not present

## 2016-08-28 DIAGNOSIS — G9520 Unspecified cord compression: Secondary | ICD-10-CM

## 2016-08-28 DIAGNOSIS — I10 Essential (primary) hypertension: Secondary | ICD-10-CM | POA: Insufficient documentation

## 2016-08-28 DIAGNOSIS — Z87891 Personal history of nicotine dependence: Secondary | ICD-10-CM | POA: Diagnosis not present

## 2016-08-28 DIAGNOSIS — Z7984 Long term (current) use of oral hypoglycemic drugs: Secondary | ICD-10-CM | POA: Insufficient documentation

## 2016-08-28 LAB — CBC WITH DIFFERENTIAL/PLATELET
Basophils Absolute: 0 10*3/uL (ref 0–0.1)
Basophils Relative: 0 %
Eosinophils Absolute: 0.1 10*3/uL (ref 0–0.7)
Eosinophils Relative: 1 %
HEMATOCRIT: 31.3 % — AB (ref 40.0–52.0)
Hemoglobin: 10.6 g/dL — ABNORMAL LOW (ref 13.0–18.0)
LYMPHS PCT: 10 %
Lymphs Abs: 1.3 10*3/uL (ref 1.0–3.6)
MCH: 30.9 pg (ref 26.0–34.0)
MCHC: 33.9 g/dL (ref 32.0–36.0)
MCV: 91.2 fL (ref 80.0–100.0)
MONO ABS: 0.9 10*3/uL (ref 0.2–1.0)
MONOS PCT: 7 %
NEUTROS ABS: 10.8 10*3/uL — AB (ref 1.4–6.5)
Neutrophils Relative %: 82 %
Platelets: 522 10*3/uL — ABNORMAL HIGH (ref 150–440)
RBC: 3.43 MIL/uL — ABNORMAL LOW (ref 4.40–5.90)
RDW: 13.4 % (ref 11.5–14.5)
WBC: 13 10*3/uL — ABNORMAL HIGH (ref 3.8–10.6)

## 2016-08-28 LAB — COMPREHENSIVE METABOLIC PANEL
ALT: 17 U/L (ref 17–63)
ANION GAP: 8 (ref 5–15)
AST: 17 U/L (ref 15–41)
Albumin: 4.4 g/dL (ref 3.5–5.0)
Alkaline Phosphatase: 48 U/L (ref 38–126)
BUN: 26 mg/dL — AB (ref 6–20)
CO2: 27 mmol/L (ref 22–32)
Calcium: 9.2 mg/dL (ref 8.9–10.3)
Chloride: 101 mmol/L (ref 101–111)
Creatinine, Ser: 1.09 mg/dL (ref 0.61–1.24)
Glucose, Bld: 87 mg/dL (ref 65–99)
POTASSIUM: 3.9 mmol/L (ref 3.5–5.1)
Sodium: 136 mmol/L (ref 135–145)
TOTAL PROTEIN: 6.9 g/dL (ref 6.5–8.1)
Total Bilirubin: 0.5 mg/dL (ref 0.3–1.2)

## 2016-08-28 LAB — URINALYSIS, COMPLETE (UACMP) WITH MICROSCOPIC
BILIRUBIN URINE: NEGATIVE
Bacteria, UA: NONE SEEN
Glucose, UA: 50 mg/dL — AB
Hgb urine dipstick: NEGATIVE
Ketones, ur: NEGATIVE mg/dL
LEUKOCYTES UA: NEGATIVE
NITRITE: NEGATIVE
PH: 6 (ref 5.0–8.0)
Protein, ur: NEGATIVE mg/dL
SPECIFIC GRAVITY, URINE: 1.043 — AB (ref 1.005–1.030)
SQUAMOUS EPITHELIAL / LPF: NONE SEEN

## 2016-08-28 LAB — GLUCOSE, CAPILLARY: Glucose-Capillary: 93 mg/dL (ref 65–99)

## 2016-08-28 LAB — TROPONIN I

## 2016-08-28 MED ORDER — MORPHINE SULFATE (PF) 4 MG/ML IV SOLN
4.0000 mg | Freq: Once | INTRAVENOUS | Status: DC
  Filled 2016-08-28: qty 1

## 2016-08-28 MED ORDER — IOPAMIDOL (ISOVUE-370) INJECTION 76%
75.0000 mL | Freq: Once | INTRAVENOUS | Status: AC | PRN
  Administered 2016-08-28: 75 mL via INTRAVENOUS

## 2016-08-28 MED ORDER — FENTANYL CITRATE (PF) 100 MCG/2ML IJ SOLN
12.5000 ug | Freq: Once | INTRAMUSCULAR | Status: AC
  Administered 2016-08-28: 12.5 ug via INTRAVENOUS
  Filled 2016-08-28: qty 2

## 2016-08-28 MED ORDER — PREDNISONE 10 MG PO TABS: 40.0000 mg | ORAL_TABLET | Freq: Every day | ORAL | 0 refills | Status: AC

## 2016-08-28 MED ORDER — DEXAMETHASONE SODIUM PHOSPHATE 4 MG/ML IJ SOLN
10.0000 mg | Freq: Once | INTRAMUSCULAR | Status: AC
  Administered 2016-08-28: 10 mg via INTRAVENOUS
  Filled 2016-08-28: qty 3

## 2016-08-28 MED ORDER — ONDANSETRON HCL 4 MG/2ML IJ SOLN
4.0000 mg | Freq: Once | INTRAMUSCULAR | Status: AC
  Administered 2016-08-28: 4 mg via INTRAVENOUS
  Filled 2016-08-28: qty 2

## 2016-08-28 MED ORDER — SODIUM CHLORIDE 0.9 % IV BOLUS (SEPSIS)
1000.0000 mL | Freq: Once | INTRAVENOUS | Status: AC
Start: 2016-08-28 — End: 2016-08-28
  Administered 2016-08-28: 1000 mL via INTRAVENOUS

## 2016-08-28 MED ORDER — TRAMADOL HCL 50 MG PO TABS: 50.0000 mg | ORAL_TABLET | Freq: Four times a day (QID) | ORAL | 0 refills | Status: DC | PRN

## 2016-08-28 MED ORDER — SODIUM CHLORIDE 0.9 % IV BOLUS (SEPSIS)
1000.0000 mL | Freq: Once | INTRAVENOUS | Status: AC
  Administered 2016-08-28: 1000 mL via INTRAVENOUS

## 2016-08-28 MED ORDER — DEXAMETHASONE SODIUM PHOSPHATE 4 MG/ML IJ SOLN
INTRAMUSCULAR | Status: AC
  Filled 2016-08-28: qty 1

## 2016-08-28 NOTE — ED Provider Notes (Signed)
[facility] Provider Note   CSN: [id number] Arrival date & time: 08/28/16  1910     History   Chief Complaint Chief Complaint  Patient presents with  . Hypoglycemia    HPI Garrett Palka. is a 71 y.o. male history diabetes, hypertension, hyperlipidemia, cervical degenerative disease here presenting with dizziness, neck pain, hypoglycemia. Patient has been having neck pain for the last several weeks. Patient saw primary care doctor about a week ago and an x-ray that showed arthritis. Patient finished a course of steroids and is currently on tramadol. Patient states that this evening, he had sudden onset of posterior neck pain and dizziness. Felt like he was going pass out and he called EMS. EMS noticed that he was hypotensive 82/52. Blood sugar was 65 and he drank 3 orange juices and ate peanut butter and glucose went down to 63. Given 1 oral glucose and zofran. Patient states that he takes metformin as prescribed and use his insulin in the morning. He has some occasional nausea but denies any vomiting or fevers or neck stiffness.  Denies any arm or leg weakness or trouble speaking.   The history is provided by the patient.    Past Medical History:  Diagnosis Date  . Anemia 08/14/2015  . Anxiety   . DDD (degenerative disc disease), cervical 08/21/2016  . Depression   . Diabetes mellitus without complication ([place])   . Facet arthropathy, cervical ([place]) 08/21/2016  . GERD (gastroesophageal reflux disease)   . Hyperlipidemia   . Hypertension   . Thrombocytosis ([place]) 08/14/2015    Patient Active Problem List   Diagnosis Date Noted  . Neck pain, chronic 08/21/2016  . DDD (degenerative disc disease), cervical 08/21/2016  . Facet arthropathy, cervical ([place]) 08/21/2016  . De Quervain's tenosynovitis 08/21/2016  . Vertigo 10/06/2015  . Acid reflux 09/09/2015  . Colon cancer screening 08/14/2015  . Essential hypertension, benign 08/14/2015  . Type 2 diabetes mellitus  ([place]) 08/14/2015  . Hyperlipidemia 08/14/2015  . Medication monitoring encounter 08/14/2015  . Anemia 08/14/2015  . Thrombocytosis ([place]) 08/14/2015    Past Surgical History:  Procedure Laterality Date  . CATARACT EXTRACTION    . RETINAL LASER PROCEDURE         Home Medications    Prior to Admission medications   Medication Sig Start Date End Date Taking? Authorizing Provider  aspirin EC 81 MG tablet Take by mouth.    [provider]  bimatoprost (LUMIGAN) 0.01 % SOLN 1 drop at bedtime.    [provider]  glucose blood (RELION ULTIMA TEST) test strip Check fingerstick blood sugar 3x a day; dx E11.9; LON 99 months 11/19/15   Lada, Satira Anis, MD  Insulin Detemir (LEVEMIR FLEXTOUCH) 100 UNIT/ML Pen Inject 33 Units into the skin every morning. 07/30/16   Lada, Satira Anis, MD  irbesartan (AVAPRO) 150 MG tablet Take 1 tablet (150 mg total) by mouth daily. 06/18/16   [name], MD  metFORMIN (GLUCOPHAGE) 1000 MG tablet TAKE 1 TABLET BY MOUTH TWO  TIMES DAILY WITH A MEAL 06/18/16   Lada, Satira Anis, MD  Multiple Vitamin (MULTI-VITAMINS) TABS Take by mouth.    [provider]  NOVOFINE 32G X 6 MM MISC USE DAILY AS DIRECTED 07/03/15   Ashok Norris, MD  omeprazole (PRILOSEC) 20 MG capsule Take 1 capsule (20 mg total) by mouth daily. 08/21/16   [name], MD  predniSONE (DELTASONE) 10 MG tablet Take 3 tablets (30 mg total) by mouth  daily with breakfast. Never stop abruptly 08/21/16   [name], MD  SIMBRINZA 1-0.2 % SUSP  04/18/16   [provider]  simvastatin (ZOCOR) 40 MG tablet Take 1 tablet (40 mg total) by mouth at bedtime. 06/18/16   [name], MD  tamsulosin (FLOMAX) 0.4 MG CAPS capsule Take 1 capsule (0.4 mg total) by mouth daily. 06/18/16   Lada, Satira Anis, MD  traMADol (ULTRAM) 50 MG tablet Take 1 tablet (50 mg total) by mouth every 6 (six) hours as needed. 08/28/16   [name], MD    Family History Family History  Problem  Relation Age of Onset  . Stroke Mother   . Diabetes Mellitus II Father   . Diabetes Sister   . Cancer Maternal Grandfather   . Stroke Paternal Grandmother     Social History Social History  Substance Use Topics  . Smoking status: Former Research scientist (life sciences)  . Smokeless tobacco: Never Used  . Alcohol use No     Allergies   Codeine   Review of Systems Review of Systems  Musculoskeletal: Positive for neck pain.  All other systems reviewed and are negative.    Physical Exam Updated Vital Signs BP (!) 101/58 (BP Location: Left Arm)   Pulse (!) 52   Temp 97.9 F (36.6 C) (Oral)   Resp 18   Ht 5\' 8"  (1.727 m)   Wt 57.2 kg (126 lb)   SpO2 100%   BMI 19.16 kg/m   Physical Exam  Constitutional: He is oriented to person, place, and time.  Slightly uncomfortable   HENT:  Head: Normocephalic.  Mouth/Throat: Oropharynx is clear and moist.  Eyes: Pupils are equal, round, and reactive to light. Conjunctivae and EOM are normal.  Neck: Normal range of motion. Neck supple.  No obvious bruit. No meningeal signs   Cardiovascular: Normal rate, regular rhythm and normal heart sounds.   Pulmonary/Chest: Effort normal and breath sounds normal. No respiratory distress. He has no wheezes.  Abdominal: Soft. Bowel sounds are normal. He exhibits no distension. There is no tenderness.  Musculoskeletal: Normal range of motion.  Neurological: He is alert and oriented to person, place, and time.  CN 2-12 intact. Nl strength and sensation throughout   Skin: Skin is warm.  Psychiatric: He has a normal mood and affect.  Nursing note and vitals reviewed.    ED Treatments / Results  Labs (all labs ordered are listed, but only abnormal results are displayed) Labs Reviewed  CBC WITH DIFFERENTIAL/PLATELET  COMPREHENSIVE METABOLIC PANEL  TROPONIN I  URINALYSIS, COMPLETE (UACMP) WITH MICROSCOPIC  CBG MONITORING, ED    EKG  EKG Interpretation None       Radiology No results  found.  Procedures Procedures (including critical care time)  Medications Ordered in ED Medications  sodium chloride 0.9 % bolus 1,000 mL (not administered)  morphine 4 MG/ML injection 4 mg (not administered)  ondansetron (ZOFRAN) injection 4 mg (not administered)     Initial Impression / Assessment and Plan / ED Course  I have reviewed the triage vital signs and the nursing notes.  Pertinent labs & imaging results that were available during my care of the patient were reviewed by me and considered in my medical decision making (see chart for details).     Garrett Matherne. is a 71 y.o. male here with neck pain, hypotension, borderline hypoglycemia. Had recent xray that showed arthritis. Sudden onset of worsening neck pain, dizziness. Consider dissection vs radiculopathy. Will get  labs, CT angio head/neck. Denies any chest pain or abdominal pain. Nl neuro exam currently.  8:45 PM BP dropped to 70/50. Placed on trendelenberg position. Ordered second NS bolus. WBC 13. But there is no meningeal signs. CT angio head/neck pending. UA ordered and pending. Held abx for now as there is no obvious source of infection. If CT angio unremarkable, will need reassessment and if patient still hypotensive or orthostatic, will need admission. Signed out to Dr. Alfred Levins in the ED.    Final Clinical Impressions(s) / ED Diagnoses   Final diagnoses:  None    New Prescriptions New Prescriptions   No medications on file     Drenda Freeze, MD 08/28/16 2047

## 2016-08-28 NOTE — ED Provider Notes (Signed)
-----------------------------------------   11:19 PM on 08/28/2016 -----------------------------------------   Blood pressure (!) 109/53, pulse 79, temperature 97.9 F (36.6 C), temperature source Oral, resp. rate 14, height 5\' 8"  (1.727 m), weight 57.2 kg (126 lb), SpO2 99 %.  Assuming care from Dr. Darl Householder of [name] is a 71 y.o. male with a chief complaint of Hypoglycemia .    In summary, 71 year old male presenting for evaluation of dizziness, hypoglycemia, neck pain. According to the patient he has had neck pain for a few months with recent XR showing arthritis. Plan was to repeat blood pressure after patient received IV fluids and follow-up results of CT angiogram of the head and neck.  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 Patient is neurologically intact with normal strength, sensation, intact reflexes. Patient is able to ambulate with no difficulty. I discussed with Dr. Lajuan Lines, neurosurgeon on-call, patient's presentation, vitals, neurological exam, and CT scan. He evaluated the CT personally. He recommended giving the patient steroids and discharging him home with close follow-up in his office tomorrow for reevaluation. Patient 's vitals improved with IVF. NO fever, no meningeal signs, no infection on history or exam. Discuss strict return precautions for paresthesias, numbness, weakness of his extremities, gait instability and recommended that he returns to the emergency room if these develop.    Rudene Re, MD 08/28/16 2328

## 2016-08-28 NOTE — ED Notes (Signed)
Blood sugar was 96

## 2016-08-28 NOTE — Telephone Encounter (Signed)
Pt states he needs a refill on Tramodol. Pt states if he is supposed to continue this medication he will run out over the weekend. Please advise.

## 2016-08-28 NOTE — ED Triage Notes (Signed)
Pt arrived via [facility] from home. He called for ambulance because he "didnt feel right" and had extreme pain in the back of the neck. Pt was very pale and felt dizzy. Pt has Hx of HTN and arthritis in the neck for which he recently started taking Tramadol for on June 5th. VS per EMS BP-82/52 HR-54 O2-97%RA. Blood sugar was reported at 65 and pt stated it's normally "in the 100s". EMS stated that he drank 3 orange juices, ate peanut butter sand, and a swiss roll and his blood sugar actually dropped to 63. EMS gave 1 oral glucose 37.5g and 4mg  Zofran for the nausea. EMS placed a 20G in the right hand. Pt reports no change in diet or medication (except for the recently prescribed Tramadol). Pt blood pressure logs and medication list at the bedside. Wife at the bedside.

## 2016-08-28 NOTE — Discharge Instructions (Signed)
Follow up with neurosurgery tomorrow. Call his office first thing in the morning for an appointment. Take the steroids as prescribed. Return emergently to the emergency room if you have pins and needles, decreased sensation, weakness of your extremities or any new symptoms that are concerning to you.

## 2016-08-28 NOTE — Telephone Encounter (Signed)
[place] web site reviewed for last 12 months No red flags I am the only prescriber Okay for refill I have printed it and it can be faxed to the local pharmacy of his choice Thank you

## 2016-08-29 NOTE — Telephone Encounter (Signed)
I spoke to the patient; he is not sure he'll be able to get in to see the surgeon today Staff will fax Rx in case it is needed, large disc herniation

## 2016-08-29 NOTE — Telephone Encounter (Signed)
Pt ended up in the ER last night and states he no longer need this prescription. He feels that when he see the specialist that the prescriptions are going to be different.

## 2016-09-03 ENCOUNTER — Other Ambulatory Visit: Payer: Self-pay | Admitting: Neurosurgery

## 2016-09-03 DIAGNOSIS — G959 Disease of spinal cord, unspecified: Secondary | ICD-10-CM

## 2016-09-03 DIAGNOSIS — M4712 Other spondylosis with myelopathy, cervical region: Secondary | ICD-10-CM

## 2016-09-08 ENCOUNTER — Ambulatory Visit: Payer: Medicare Other

## 2016-09-09 ENCOUNTER — Ambulatory Visit
Admission: RE | Admit: 2016-09-09 | Discharge: 2016-09-09 | Disposition: A | Discharge status: Home / Self Care | Payer: Medicare Other | Source: Ambulatory Visit | Attending: Neurosurgery | Admitting: Neurosurgery

## 2016-09-09 DIAGNOSIS — M4802 Spinal stenosis, cervical region: Secondary | ICD-10-CM | POA: Diagnosis not present

## 2016-09-09 DIAGNOSIS — M2578 Osteophyte, vertebrae: Secondary | ICD-10-CM | POA: Diagnosis not present

## 2016-09-09 DIAGNOSIS — M4712 Other spondylosis with myelopathy, cervical region: Secondary | ICD-10-CM | POA: Diagnosis not present

## 2016-09-09 DIAGNOSIS — M5412 Radiculopathy, cervical region: Secondary | ICD-10-CM

## 2016-09-13 ENCOUNTER — Other Ambulatory Visit: Payer: Self-pay | Admitting: Family Medicine

## 2016-09-13 NOTE — Telephone Encounter (Signed)
Cr and SGPT reviewed; Rxs approved

## 2016-09-19 ENCOUNTER — Encounter
Admission: RE | Admit: 2016-09-19 | Discharge: 2016-09-19 | Disposition: A | Discharge status: Home / Self Care | Payer: Medicare Other | Source: Ambulatory Visit | Attending: Neurosurgery | Admitting: Neurosurgery

## 2016-09-19 DIAGNOSIS — Z01810 Encounter for preprocedural cardiovascular examination: Secondary | ICD-10-CM | POA: Diagnosis present

## 2016-09-19 DIAGNOSIS — Z01812 Encounter for preprocedural laboratory examination: Secondary | ICD-10-CM | POA: Diagnosis not present

## 2016-09-19 LAB — DIFFERENTIAL
BASOS ABS: 0.1 10*3/uL (ref 0–0.1)
BASOS PCT: 1 %
EOS ABS: 0.1 10*3/uL (ref 0–0.7)
EOS PCT: 1 %
Lymphocytes Relative: 20 %
Lymphs Abs: 1.4 10*3/uL (ref 1.0–3.6)
MONO ABS: 0.4 10*3/uL (ref 0.2–1.0)
MONOS PCT: 6 %
Neutro Abs: 5.2 10*3/uL (ref 1.4–6.5)
Neutrophils Relative %: 72 %

## 2016-09-19 LAB — BASIC METABOLIC PANEL
ANION GAP: 11 (ref 5–15)
BUN: 25 mg/dL — AB (ref 6–20)
CALCIUM: 9.6 mg/dL (ref 8.9–10.3)
CO2: 27 mmol/L (ref 22–32)
CREATININE: 1.16 mg/dL (ref 0.61–1.24)
Chloride: 99 mmol/L — ABNORMAL LOW (ref 101–111)
GFR calc Af Amer: >60 mL/min (ref >60)
GLUCOSE: 197 mg/dL — AB (ref 65–99)
Potassium: 4.2 mmol/L (ref 3.5–5.1)
Sodium: 137 mmol/L (ref 135–145)

## 2016-09-19 LAB — CBC
HEMATOCRIT: 35.7 % — AB (ref 40.0–52.0)
Hemoglobin: 12.1 g/dL — ABNORMAL LOW (ref 13.0–18.0)
MCH: 31.1 pg (ref 26.0–34.0)
MCHC: 33.7 g/dL (ref 32.0–36.0)
MCV: 92 fL (ref 80.0–100.0)
Platelets: 566 10*3/uL — ABNORMAL HIGH (ref 150–440)
RBC: 3.88 MIL/uL — ABNORMAL LOW (ref 4.40–5.90)
RDW: 13.7 % (ref 11.5–14.5)
WBC: 7.2 10*3/uL (ref 3.8–10.6)

## 2016-09-19 LAB — PROTIME-INR
INR: 0.85
Prothrombin Time: 11.6 seconds (ref 11.4–15.2)

## 2016-09-19 LAB — APTT: aPTT: 27 seconds (ref 24–36)

## 2016-09-19 LAB — URINALYSIS, COMPLETE (UACMP) WITH MICROSCOPIC
BACTERIA UA: NONE SEEN
Bilirubin Urine: NEGATIVE
Glucose, UA: >=500 mg/dL — AB
Hgb urine dipstick: NEGATIVE
KETONES UR: 5 mg/dL — AB
LEUKOCYTES UA: NEGATIVE
Nitrite: NEGATIVE
PROTEIN: NEGATIVE mg/dL
Specific Gravity, Urine: 1.026 (ref 1.005–1.030)
Squamous Epithelial / LPF: NONE SEEN
pH: 5 (ref 5.0–8.0)

## 2016-09-19 LAB — SURGICAL PCR SCREEN
MRSA, PCR: NEGATIVE
STAPHYLOCOCCUS AUREUS: NEGATIVE

## 2016-09-19 LAB — TYPE AND SCREEN
ABO/RH(D): A POS
Antibody Screen: NEGATIVE

## 2016-09-19 NOTE — Patient Instructions (Signed)
Your procedure is scheduled on: September 24, 2016 [facility] ) Report to Same Day Surgery 2nd floor medical mall [facility] Entrance-take elevator on left to 2nd floor.  Check in with surgery information desk.) To find out your arrival time please call [phone] between 1PM - 3PM on September 23, 2016 (TUESDAY ) Remember: Instructions that are not followed completely may result in serious medical risk, up to and including death, or upon the discretion of your surgeon and anesthesiologist your surgery may need to be rescheduled.    _x___ 1. Do not eat food or drink liquids after midnight. No gum chewing or hard candies                              __x__ 2. No Alcohol for 24 hours before or after surgery.   __x__3. No Smoking for 24 prior to surgery.   ____  4. Bring all medications with you on the day of surgery if instructed.    __x__ 5. Notify your doctor if there is any change in your medical condition     (cold, fever, infections).     Do not wear jewelry, make-up, hairpins, clips or nail polish.   Do not wear lotions, powders, or perfumes. .  Do not shave 48 hours prior to surgery. Men may shave face and neck.  Do not bring valuables to the hospital.    [facility] is not responsible for any belongings or valuables.               Contacts, dentures or bridgework may not be worn into surgery.  Leave your suitcase in the car. After surgery it may be brought to your room.  For patients admitted to the hospital, discharge time is determined by your treatment team                      Patients discharged the day of surgery will not be allowed to drive home.  You will need someone to drive you home and stay with you the night of your procedure.    Please read over the following fact sheets that you were given:   [facility] Preparing for Surgery and or MRSA Information   TAKE THE FOLLOWING MEDICATIONS THE MORNING OF SURGERY WITH A SIP OF WATER :  1. IRBESARTAN  2. OMEPRAZOLE  (OMEPRAZOLE AT BEDTIME ON TUESDAY NIGHT AT BEDTIME )  3.  4.  5.  6.  ____Fleets enema or Magnesium Citrate as directed.   _x___ Use CHG Soap or sage wipes as directed on instruction sheet   ____ Use inhalers on the day of surgery and bring to hospital day of surgery  __x__ Stop Metformin t 2 days prior to surgery. (STOP METFORMIN ON AUGUST 6 )    ____ Take 1/2 of usual insulin dose the night before surgery and none on the morning surgery (NO INSULIN THE MORNING OF SURGERY )    _x___ Follow recommendations from Cardiologist, Pulmonologist or PCP regarding          stopping Aspirin, Coumadin, Pllavix ,Eliquis, Effient, or Pradaxa, and Pletal. (STOP ASPIRIN ONE WEEK PRIOR TO SURGERY  )  X____Stop Anti-inflammatories such as Advil, Aleve, Ibuprofen, Motrin, Naproxen, Naprosyn, Goodies powders or aspirin products. OK to take Tylenol or Tramadol if needed for pain   _x___ Stop supplements until after surgery.  But may continue Vitamin D, Vitamin B, and multivitamin  (STOP  TURMERIC AND PROBIOTIC NOW )       ____ Bring C-Pap to the hospital.

## 2016-09-19 NOTE — Progress Notes (Signed)
Pharmacy Antibiotic Note  Garrett Yoder. is a 71 y.o. male with planned anterior cervical discectomy and fusion on 09/24/2016 with .  Pharmacy has been consulted for cefazolin dosing.  Plan: Cefazolin 1 gm IV 30 to 60 minutes pre-op (ABW less than 60 kg). Uncomplicated discectomies and fusions usually require single pre-op dose. If complications arise or implants are used please re-consult pharmacy for additional doses.  Height: 5\' 8"  (172.7 cm) Weight: 128 lb (58.1 kg) IBW/kg (Calculated) : 68.4  No data recorded.   Recent Labs Lab 09/19/16 1025  WBC 7.2  CREATININE 1.16    Estimated Creatinine Clearance: 48.7 mL/min (by C-G formula based on SCr of 1.16 mg/dL).    Allergies  Allergen Reactions  . Codeine     Other reaction(s): makes him sick to his stomach    Thank you for allowing pharmacy to be a part of this patient's care.  Laural Benes, Pharm.D., BCPS Clinical Pharmacist 09/19/2016 4:29 PM

## 2016-09-22 ENCOUNTER — Encounter: Payer: Self-pay | Admitting: Family Medicine

## 2016-09-23 MED ORDER — CEFAZOLIN SODIUM-DEXTROSE 1-4 GM/50ML-% IV SOLN
1.0000 g | INTRAVENOUS | Status: AC
  Administered 2016-09-24: 1 g via INTRAVENOUS

## 2016-09-24 ENCOUNTER — Ambulatory Visit: Payer: Medicare Other | Admitting: Registered Nurse

## 2016-09-24 ENCOUNTER — Observation Stay
Admission: RE | Admit: 2016-09-24 | Discharge: 2016-09-25 | Disposition: A | Discharge status: Home / Self Care | Payer: Medicare Other | Source: Ambulatory Visit | Attending: Neurosurgery | Admitting: Neurosurgery

## 2016-09-24 ENCOUNTER — Encounter
Admission: RE | Disposition: A | Discharge status: Home / Self Care | Payer: Self-pay | Source: Ambulatory Visit | Attending: Neurosurgery

## 2016-09-24 ENCOUNTER — Ambulatory Visit: Payer: Medicare Other

## 2016-09-24 ENCOUNTER — Observation Stay: Payer: Medicare Other

## 2016-09-24 DIAGNOSIS — Z79899 Other long term (current) drug therapy: Secondary | ICD-10-CM | POA: Diagnosis not present

## 2016-09-24 DIAGNOSIS — F419 Anxiety disorder, unspecified: Secondary | ICD-10-CM | POA: Insufficient documentation

## 2016-09-24 DIAGNOSIS — Z794 Long term (current) use of insulin: Secondary | ICD-10-CM | POA: Diagnosis not present

## 2016-09-24 DIAGNOSIS — M4802 Spinal stenosis, cervical region: Secondary | ICD-10-CM | POA: Insufficient documentation

## 2016-09-24 DIAGNOSIS — Z419 Encounter for procedure for purposes other than remedying health state, unspecified: Secondary | ICD-10-CM

## 2016-09-24 DIAGNOSIS — Z885 Allergy status to narcotic agent status: Secondary | ICD-10-CM | POA: Insufficient documentation

## 2016-09-24 DIAGNOSIS — Z87891 Personal history of nicotine dependence: Secondary | ICD-10-CM | POA: Insufficient documentation

## 2016-09-24 DIAGNOSIS — M4322 Fusion of spine, cervical region: Secondary | ICD-10-CM

## 2016-09-24 DIAGNOSIS — M1990 Unspecified osteoarthritis, unspecified site: Secondary | ICD-10-CM | POA: Insufficient documentation

## 2016-09-24 DIAGNOSIS — Z7982 Long term (current) use of aspirin: Secondary | ICD-10-CM | POA: Insufficient documentation

## 2016-09-24 DIAGNOSIS — K219 Gastro-esophageal reflux disease without esophagitis: Secondary | ICD-10-CM | POA: Insufficient documentation

## 2016-09-24 DIAGNOSIS — I10 Essential (primary) hypertension: Secondary | ICD-10-CM | POA: Insufficient documentation

## 2016-09-24 DIAGNOSIS — M4712 Other spondylosis with myelopathy, cervical region: Secondary | ICD-10-CM | POA: Diagnosis present

## 2016-09-24 DIAGNOSIS — E119 Type 2 diabetes mellitus without complications: Secondary | ICD-10-CM | POA: Diagnosis not present

## 2016-09-24 DIAGNOSIS — F329 Major depressive disorder, single episode, unspecified: Secondary | ICD-10-CM | POA: Diagnosis not present

## 2016-09-24 DIAGNOSIS — G959 Disease of spinal cord, unspecified: Secondary | ICD-10-CM | POA: Diagnosis present

## 2016-09-24 HISTORY — PX: ANTERIOR CERVICAL DECOMP/DISCECTOMY FUSION: SHX1161

## 2016-09-24 LAB — ABO/RH: ABO/RH(D): A POS

## 2016-09-24 LAB — GLUCOSE, CAPILLARY
Glucose-Capillary: 152 mg/dL — ABNORMAL HIGH (ref 65–99)
Glucose-Capillary: 181 mg/dL — ABNORMAL HIGH (ref 65–99)

## 2016-09-24 SURGERY — ANTERIOR CERVICAL DECOMPRESSION/DISCECTOMY FUSION 3 LEVELS
Anesthesia: General | Site: Spine Cervical | Wound class: Clean

## 2016-09-24 MED ORDER — PHENYLEPHRINE HCL 10 MG/ML IJ SOLN
INTRAMUSCULAR | Status: AC
  Filled 2016-09-24: qty 1

## 2016-09-24 MED ORDER — DEXAMETHASONE SODIUM PHOSPHATE 10 MG/ML IJ SOLN
INTRAMUSCULAR | Status: DC | PRN
  Administered 2016-09-24: 10 mg via INTRAVENOUS

## 2016-09-24 MED ORDER — SUCCINYLCHOLINE CHLORIDE 20 MG/ML IJ SOLN
INTRAMUSCULAR | Status: DC | PRN
  Administered 2016-09-24: 80 mg via INTRAVENOUS

## 2016-09-24 MED ORDER — SODIUM CHLORIDE 0.9 % IR SOLN
Status: DC | PRN
  Administered 2016-09-24: 1000 mL

## 2016-09-24 MED ORDER — FENTANYL CITRATE (PF) 100 MCG/2ML IJ SOLN
INTRAMUSCULAR | Status: AC
  Filled 2016-09-24: qty 2

## 2016-09-24 MED ORDER — ACETAMINOPHEN 500 MG PO TABS
1000.0000 mg | ORAL_TABLET | Freq: Four times a day (QID) | ORAL | Status: DC
  Administered 2016-09-24 – 2016-09-25 (×3): 1000 mg via ORAL
  Filled 2016-09-24 (×3): qty 2

## 2016-09-24 MED ORDER — SODIUM CHLORIDE 0.9 % IV SOLN
INTRAVENOUS | Status: DC
  Administered 2016-09-24: 10:00:00 via INTRAVENOUS

## 2016-09-24 MED ORDER — BACITRACIN 50000 UNITS IM SOLR
INTRAMUSCULAR | Status: AC
  Filled 2016-09-24: qty 1

## 2016-09-24 MED ORDER — SODIUM CHLORIDE FLUSH 0.9 % IV SOLN
INTRAVENOUS | Status: AC
  Filled 2016-09-24: qty 10

## 2016-09-24 MED ORDER — LATANOPROST 0.005 % OP SOLN
1.0000 [drp] | Freq: Every day | OPHTHALMIC | Status: DC
  Administered 2016-09-24: 1 [drp] via OPHTHALMIC
  Filled 2016-09-24: qty 2.5

## 2016-09-24 MED ORDER — SEVOFLURANE IN SOLN
RESPIRATORY_TRACT | Status: AC
  Filled 2016-09-24: qty 250

## 2016-09-24 MED ORDER — BUPIVACAINE-EPINEPHRINE 0.5% -1:200000 IJ SOLN
INTRAMUSCULAR | Status: DC | PRN
  Administered 2016-09-24: 30 mL

## 2016-09-24 MED ORDER — MIDAZOLAM HCL 2 MG/2ML IJ SOLN
INTRAMUSCULAR | Status: DC | PRN
  Administered 2016-09-24: 2 mg via INTRAVENOUS

## 2016-09-24 MED ORDER — PROPOFOL 500 MG/50ML IV EMUL
INTRAVENOUS | Status: AC
  Filled 2016-09-24: qty 100

## 2016-09-24 MED ORDER — PROPOFOL 10 MG/ML IV BOLUS
INTRAVENOUS | Status: DC | PRN
  Administered 2016-09-24: 20 mg via INTRAVENOUS
  Administered 2016-09-24: 80 mg via INTRAVENOUS

## 2016-09-24 MED ORDER — SODIUM CHLORIDE 0.9% FLUSH: 3.0000 mL | Freq: Two times a day (BID) | INTRAVENOUS | Status: DC

## 2016-09-24 MED ORDER — METHOCARBAMOL 500 MG PO TABS
500.0000 mg | ORAL_TABLET | Freq: Four times a day (QID) | ORAL | Status: DC | PRN
  Administered 2016-09-24 – 2016-09-25 (×3): 500 mg via ORAL
  Filled 2016-09-24 (×3): qty 1

## 2016-09-24 MED ORDER — ONDANSETRON HCL 4 MG/2ML IJ SOLN
INTRAMUSCULAR | Status: AC
  Filled 2016-09-24: qty 2

## 2016-09-24 MED ORDER — DEXAMETHASONE SODIUM PHOSPHATE 10 MG/ML IJ SOLN
INTRAMUSCULAR | Status: AC
  Filled 2016-09-24: qty 1

## 2016-09-24 MED ORDER — THROMBIN 5000 UNITS EX KIT
PACK | CUTANEOUS | Status: DC | PRN
  Administered 2016-09-24: 5000 [IU] via TOPICAL

## 2016-09-24 MED ORDER — LIDOCAINE HCL (CARDIAC) 20 MG/ML IV SOLN
INTRAVENOUS | Status: DC | PRN
  Administered 2016-09-24: 80 mg via INTRAVENOUS

## 2016-09-24 MED ORDER — SODIUM CHLORIDE 0.9 % IV SOLN
INTRAVENOUS | Status: DC | PRN
  Administered 2016-09-24: 10:00:00 via INTRAVENOUS

## 2016-09-24 MED ORDER — OXYCODONE HCL 5 MG PO TABS: 5.0000 mg | ORAL_TABLET | Freq: Once | ORAL | Status: DC | PRN

## 2016-09-24 MED ORDER — METFORMIN HCL 500 MG PO TABS
1000.0000 mg | ORAL_TABLET | Freq: Two times a day (BID) | ORAL | Status: DC
  Administered 2016-09-25: 1000 mg via ORAL
  Filled 2016-09-24 (×2): qty 2

## 2016-09-24 MED ORDER — OXYCODONE HCL 5 MG PO TABS
5.0000 mg | ORAL_TABLET | ORAL | Status: DC | PRN
  Administered 2016-09-24 – 2016-09-25 (×5): 5 mg via ORAL
  Filled 2016-09-24 (×5): qty 1

## 2016-09-24 MED ORDER — INSULIN DETEMIR 100 UNIT/ML ~~LOC~~ SOLN
33.0000 [IU] | Freq: Every morning | SUBCUTANEOUS | Status: DC
  Administered 2016-09-25: 33 [IU] via SUBCUTANEOUS
  Filled 2016-09-24 (×2): qty 0.33

## 2016-09-24 MED ORDER — FENTANYL CITRATE (PF) 100 MCG/2ML IJ SOLN
25.0000 ug | INTRAMUSCULAR | Status: AC
  Administered 2016-09-24 (×4): 25 ug via INTRAVENOUS

## 2016-09-24 MED ORDER — REMIFENTANIL HCL 1 MG IV SOLR
INTRAVENOUS | Status: AC
  Filled 2016-09-24: qty 2000

## 2016-09-24 MED ORDER — FENTANYL CITRATE (PF) 100 MCG/2ML IJ SOLN
25.0000 ug | INTRAMUSCULAR | Status: AC | PRN
  Administered 2016-09-24 (×6): 25 ug via INTRAVENOUS

## 2016-09-24 MED ORDER — ACETAMINOPHEN 650 MG RE SUPP: 650.0000 mg | RECTAL | Status: DC | PRN

## 2016-09-24 MED ORDER — SODIUM CHLORIDE 0.9 % IJ SOLN
INTRAMUSCULAR | Status: AC
  Filled 2016-09-24: qty 20

## 2016-09-24 MED ORDER — HYDROMORPHONE HCL 1 MG/ML IJ SOLN
INTRAMUSCULAR | Status: AC
  Administered 2016-09-24: 0.25 mg via INTRAVENOUS
  Filled 2016-09-24: qty 1

## 2016-09-24 MED ORDER — PROPOFOL 500 MG/50ML IV EMUL
INTRAVENOUS | Status: DC | PRN
  Administered 2016-09-24: 150 ug/kg/min via INTRAVENOUS

## 2016-09-24 MED ORDER — CEFAZOLIN SODIUM-DEXTROSE 1-4 GM/50ML-% IV SOLN
INTRAVENOUS | Status: AC
  Filled 2016-09-24: qty 50

## 2016-09-24 MED ORDER — THROMBIN 5000 UNITS EX SOLR
CUTANEOUS | Status: AC
  Filled 2016-09-24: qty 5000

## 2016-09-24 MED ORDER — PROPOFOL 10 MG/ML IV BOLUS
INTRAVENOUS | Status: AC
  Filled 2016-09-24: qty 20

## 2016-09-24 MED ORDER — PHENOL 1.4 % MT LIQD
1.0000 | OROMUCOSAL | Status: DC | PRN
  Administered 2016-09-24: 1 via OROMUCOSAL
  Filled 2016-09-24 (×3): qty 177

## 2016-09-24 MED ORDER — IRBESARTAN 150 MG PO TABS
150.0000 mg | ORAL_TABLET | Freq: Every day | ORAL | Status: DC
  Administered 2016-09-25: 150 mg via ORAL
  Filled 2016-09-24: qty 1

## 2016-09-24 MED ORDER — TAMSULOSIN HCL 0.4 MG PO CAPS
0.4000 mg | ORAL_CAPSULE | Freq: Every day | ORAL | Status: DC
  Administered 2016-09-24 – 2016-09-25 (×2): 0.4 mg via ORAL
  Filled 2016-09-24 (×2): qty 1

## 2016-09-24 MED ORDER — ONDANSETRON HCL 4 MG/2ML IJ SOLN: 4.0000 mg | Freq: Four times a day (QID) | INTRAMUSCULAR | Status: DC | PRN

## 2016-09-24 MED ORDER — PHENYLEPHRINE HCL 10 MG/ML IJ SOLN
INTRAMUSCULAR | Status: DC | PRN
Start: 2016-09-24 — End: 2016-09-24
  Administered 2016-09-24: 200 ug via INTRAVENOUS
  Administered 2016-09-24: 100 ug via INTRAVENOUS
  Administered 2016-09-24: 200 ug via INTRAVENOUS

## 2016-09-24 MED ORDER — SODIUM CHLORIDE 0.9 % IV SOLN
INTRAVENOUS | Status: DC
  Administered 2016-09-24: 20:00:00 via INTRAVENOUS

## 2016-09-24 MED ORDER — SODIUM CHLORIDE 0.9% FLUSH: 3.0000 mL | INTRAVENOUS | Status: DC | PRN

## 2016-09-24 MED ORDER — FENTANYL CITRATE (PF) 100 MCG/2ML IJ SOLN
INTRAMUSCULAR | Status: AC
  Administered 2016-09-24: 25 ug via INTRAVENOUS
  Filled 2016-09-24: qty 2

## 2016-09-24 MED ORDER — SIMVASTATIN 20 MG PO TABS
40.0000 mg | ORAL_TABLET | Freq: Every day | ORAL | Status: DC
  Administered 2016-09-24: 40 mg via ORAL
  Filled 2016-09-24: qty 2

## 2016-09-24 MED ORDER — HYDROMORPHONE HCL 1 MG/ML IJ SOLN: 0.5000 mg | INTRAMUSCULAR | Status: DC | PRN

## 2016-09-24 MED ORDER — BUPIVACAINE-EPINEPHRINE (PF) 0.5% -1:200000 IJ SOLN
INTRAMUSCULAR | Status: AC
  Filled 2016-09-24: qty 30

## 2016-09-24 MED ORDER — GELATIN ABSORBABLE 12-7 MM EX MISC
CUTANEOUS | Status: DC | PRN
  Administered 2016-09-24: 1

## 2016-09-24 MED ORDER — METHOCARBAMOL 1000 MG/10ML IJ SOLN
500.0000 mg | Freq: Four times a day (QID) | INTRAVENOUS | Status: DC | PRN
  Filled 2016-09-24: qty 5

## 2016-09-24 MED ORDER — INSULIN ASPART 100 UNIT/ML ~~LOC~~ SOLN
0.0000 [IU] | Freq: Three times a day (TID) | SUBCUTANEOUS | Status: DC
  Administered 2016-09-25: 5 [IU] via SUBCUTANEOUS
  Administered 2016-09-25: 2 [IU] via SUBCUTANEOUS
  Filled 2016-09-24 (×2): qty 1

## 2016-09-24 MED ORDER — SODIUM CHLORIDE 0.9 % IV SOLN: 250.0000 mL | INTRAVENOUS | Status: DC

## 2016-09-24 MED ORDER — OXYCODONE HCL 5 MG/5ML PO SOLN: 5.0000 mg | Freq: Once | ORAL | Status: DC | PRN

## 2016-09-24 MED ORDER — GLYCOPYRROLATE 0.2 MG/ML IJ SOLN
INTRAMUSCULAR | Status: AC
  Filled 2016-09-24: qty 1

## 2016-09-24 MED ORDER — ONDANSETRON HCL 4 MG PO TABS: 4.0000 mg | ORAL_TABLET | Freq: Four times a day (QID) | ORAL | Status: DC | PRN

## 2016-09-24 MED ORDER — LIDOCAINE HCL (PF) 2 % IJ SOLN
INTRAMUSCULAR | Status: AC
  Filled 2016-09-24: qty 2

## 2016-09-24 MED ORDER — ONDANSETRON HCL 4 MG/2ML IJ SOLN
INTRAMUSCULAR | Status: DC | PRN
  Administered 2016-09-24: 4 mg via INTRAVENOUS

## 2016-09-24 MED ORDER — MENTHOL 3 MG MT LOZG
1.0000 | LOZENGE | OROMUCOSAL | Status: DC | PRN
  Filled 2016-09-24 (×2): qty 9

## 2016-09-24 MED ORDER — FENTANYL CITRATE (PF) 100 MCG/2ML IJ SOLN
INTRAMUSCULAR | Status: DC | PRN
  Administered 2016-09-24: 100 ug via INTRAVENOUS

## 2016-09-24 MED ORDER — PHENYLEPHRINE 8 MG IN D5W 100 ML (0.08MG/ML) PREMIX OPTIME
INJECTION | INTRAVENOUS | Status: DC | PRN
  Administered 2016-09-24: 50 ug/min via INTRAVENOUS

## 2016-09-24 MED ORDER — GELATIN ABSORBABLE 12-7 MM EX MISC
CUTANEOUS | Status: AC
  Filled 2016-09-24: qty 1

## 2016-09-24 MED ORDER — SENNOSIDES-DOCUSATE SODIUM 8.6-50 MG PO TABS: 1.0000 | ORAL_TABLET | Freq: Every evening | ORAL | Status: DC | PRN

## 2016-09-24 MED ORDER — MIDAZOLAM HCL 2 MG/2ML IJ SOLN
INTRAMUSCULAR | Status: AC
  Filled 2016-09-24: qty 2

## 2016-09-24 MED ORDER — REMIFENTANIL HCL 1 MG IV SOLR
INTRAVENOUS | Status: DC | PRN
  Administered 2016-09-24: .2 ug/kg/min via INTRAVENOUS

## 2016-09-24 MED ORDER — HYDROMORPHONE HCL 1 MG/ML IJ SOLN
0.2500 mg | INTRAMUSCULAR | Status: AC | PRN
  Administered 2016-09-24 (×8): 0.25 mg via INTRAVENOUS

## 2016-09-24 MED ORDER — GABAPENTIN 300 MG PO CAPS
300.0000 mg | ORAL_CAPSULE | Freq: Once | ORAL | Status: AC
  Administered 2016-09-24: 300 mg via ORAL
  Filled 2016-09-24: qty 1

## 2016-09-24 MED ORDER — METHOCARBAMOL 500 MG PO TABS
500.0000 mg | ORAL_TABLET | Freq: Once | ORAL | Status: AC
  Administered 2016-09-24: 500 mg via ORAL
  Filled 2016-09-24: qty 1

## 2016-09-24 MED ORDER — ACETAMINOPHEN 325 MG PO TABS: 650.0000 mg | ORAL_TABLET | ORAL | Status: DC | PRN

## 2016-09-24 MED FILL — Thrombin For Soln 5000 Unit: CUTANEOUS | Qty: 5000 | Status: AC

## 2016-09-24 SURGICAL SUPPLY — 68 items
ADH SKN CLS APL DERMABOND .7 (GAUZE/BANDAGES/DRESSINGS) ×1
AGENT HMST MTR 8 SURGIFLO (HEMOSTASIS) ×1
BAND RUBBER 3X1/6 STRL (MISCELLANEOUS) ×1 IMPLANT
BAND RUBBER 3X1/6 TAN STRL (MISCELLANEOUS) ×3 IMPLANT
BLADE BOVIE TIP EXT 4 (BLADE) ×3 IMPLANT
BLADE SURG 15 STRL LF DISP TIS (BLADE) ×1 IMPLANT
BLADE SURG 15 STRL SS (BLADE) ×3
BONE MATRIX OSTEOCEL PRO SM (Bone Implant) ×2 IMPLANT
BULB RESERV EVAC DRAIN JP 100C (MISCELLANEOUS) IMPLANT
BUR NEURO DRILL SOFT 3.0X3.8M (BURR) ×3 IMPLANT
CAGE CERVICAL 14X16X7 7D (Cage) ×5 IMPLANT
CAGE CERVICAL 14X16X7MM 7DEG (Cage) ×5 IMPLANT
CANISTER SUCT 1200ML W/VALVE (MISCELLANEOUS) ×6 IMPLANT
CHLORAPREP W/TINT 26ML (MISCELLANEOUS) ×3 IMPLANT
CLOSURE WOUND 1/2 X4 (GAUZE/BANDAGES/DRESSINGS)
COUNTER NEEDLE 20/40 LG (NEEDLE) ×3 IMPLANT
COVER LIGHT HANDLE STERIS (MISCELLANEOUS) ×6 IMPLANT
CRADLE LAMINECT ARM (MISCELLANEOUS) ×3 IMPLANT
CUP MEDICINE 2OZ PLAST GRAD ST (MISCELLANEOUS) ×6 IMPLANT
DERMABOND ADVANCED (GAUZE/BANDAGES/DRESSINGS) ×2
DERMABOND ADVANCED .7 DNX12 (GAUZE/BANDAGES/DRESSINGS) ×1 IMPLANT
DRAIN CHANNEL JP 10F RND 20C F (MISCELLANEOUS) IMPLANT
DRAPE C-ARM 42X72 X-RAY (DRAPES) ×6 IMPLANT
DRAPE INCISE IOBAN 66X45 STRL (DRAPES) ×3 IMPLANT
DRAPE LAPAROTOMY 77X122 PED (DRAPES) ×3 IMPLANT
DRAPE MICROSCOPE SPINE 48X150 (DRAPES) ×3 IMPLANT
DRAPE POUCH INSTRU U-SHP 10X18 (DRAPES) ×3 IMPLANT
DRAPE SHEET LG 3/4 BI-LAMINATE (DRAPES) ×3 IMPLANT
DRAPE SURG 17X11 SM STRL (DRAPES) ×12 IMPLANT
DRAPE TABLE BACK 80X90 (DRAPES) ×3 IMPLANT
ELECT CAUTERY BLADE TIP 2.5 (TIP) ×3
ELECTRODE CAUTERY BLDE TIP 2.5 (TIP) ×1 IMPLANT
FEE INTRAOP MONITOR IMPULS NCS (MISCELLANEOUS) IMPLANT
FRAME EYE SHIELD (PROTECTIVE WEAR) ×3 IMPLANT
GAUZE SPONGE 4X4 12PLY STRL (GAUZE/BANDAGES/DRESSINGS) ×3 IMPLANT
GLOVE SURG SYN 8.5  E (GLOVE) ×6
GLOVE SURG SYN 8.5 E (GLOVE) ×3 IMPLANT
GLOVE SURG SYN 8.5 PF PI (GLOVE) ×3 IMPLANT
GOWN SRG XL LVL 3 NONREINFORCE (GOWNS) ×1 IMPLANT
GOWN STRL NON-REIN TWL XL LVL3 (GOWNS) ×3
GOWN STRL REUS W/ TWL LRG LVL3 (GOWN DISPOSABLE) ×1 IMPLANT
GOWN STRL REUS W/TWL LRG LVL3 (GOWN DISPOSABLE) ×3
GRADUATE 1200CC STRL 31836 (MISCELLANEOUS) ×3 IMPLANT
INTRAOP MONITOR FEE IMPULS NCS (MISCELLANEOUS) ×1
INTRAOP MONITOR FEE IMPULSE (MISCELLANEOUS) ×2
IV CATH ANGIO 12GX3 LT BLUE (NEEDLE) ×3 IMPLANT
KIT RM TURNOVER STRD PROC AR (KITS) ×3 IMPLANT
MARKER SKIN DUAL TIP RULER LAB (MISCELLANEOUS) ×6 IMPLANT
NEEDLE HYPO 22GX1.5 SAFETY (NEEDLE) ×3 IMPLANT
NS IRRIG 1000ML POUR BTL (IV SOLUTION) ×3 IMPLANT
PACK LAMINECTOMY NEURO (CUSTOM PROCEDURE TRAY) ×3 IMPLANT
PIN CASPAR 14 (PIN) ×1 IMPLANT
PIN CASPAR 14MM (PIN) ×3
PUTTY DBX 1CC (Putty) ×3 IMPLANT
PUTTY DBX 1CC DEPUY (Putty) IMPLANT
SCREW ARCHON SD VAR 4.0X15MM (Screw) ×18 IMPLANT
SPOGE SURGIFLO 8M (HEMOSTASIS) ×2
SPONGE KITTNER 5P (MISCELLANEOUS) ×6 IMPLANT
SPONGE SURGIFLO 8M (HEMOSTASIS) ×1 IMPLANT
STRIP CLOSURE SKIN 1/2X4 (GAUZE/BANDAGES/DRESSINGS) IMPLANT
SUT V-LOC 90 ABS DVC 3-0 CL (SUTURE) ×3 IMPLANT
SUT VIC AB 3-0 SH 8-18 (SUTURE) ×3 IMPLANT
SYR 30ML LL (SYRINGE) ×3 IMPLANT
TAPE ADH 3 LX (MISCELLANEOUS) ×3 IMPLANT
TOWEL OR 17X26 4PK STRL BLUE (TOWEL DISPOSABLE) ×12 IMPLANT
TRAY FOLEY W/METER SILVER 16FR (SET/KITS/TRAYS/PACK) IMPLANT
TUBING CONNECTING 10 (TUBING) ×2 IMPLANT
TUBING CONNECTING 10' (TUBING) ×1

## 2016-09-24 NOTE — H&P (Signed)
I have reviewed and confirmed my history and physical from 09/11/16 with no additions or changes. Plan for ACDf C4-7.  Risks and benefits reviewed.    Heart sounds normal no MRG. Chest Clear to Auscultation Bilaterally.

## 2016-09-24 NOTE — Anesthesia Preprocedure Evaluation (Addendum)
Anesthesia Evaluation  Patient identified by MRN, date of birth, ID band Patient awake    Reviewed: Allergy & Precautions, H&P , NPO status , Patient's Chart, lab work & pertinent test results  History of Anesthesia Complications Negative for: history of anesthetic complications  Airway Mallampati: III  TM Distance: >3 FB Neck ROM: limited    Dental  (+) Poor Dentition, Missing, Upper Dentures, Lower Dentures, Edentulous Upper, Edentulous Lower   Pulmonary neg shortness of breath, former smoker,           Cardiovascular Exercise Tolerance: Good hypertension, (-) angina(-) Past MI and (-) DOE      Neuro/Psych PSYCHIATRIC DISORDERS Anxiety Depression negative neurological ROS     GI/Hepatic Neg liver ROS, GERD  Controlled and Medicated,  Endo/Other  diabetes, Type 2, Insulin Dependent  Renal/GU      Musculoskeletal  (+) Arthritis ,   Abdominal   Peds  Hematology negative hematology ROS (+)   Anesthesia Other Findings Patient reports no problems with oxycodone   No symptoms with neck extension in pre Op but patient reports that his symptoms can be variable, plan to electively use video laryngoscopy and keep c spine neutral during intubation.  Past Medical History: 08/14/2015: Anemia No date: Anxiety 08/21/2016: DDD (degenerative disc disease), cervical No date: Depression No date: Diabetes mellitus without complication ([place]) 02/24/9148: Facet arthropathy, cervical ([facility]) No date: GERD (gastroesophageal reflux disease) No date: Hyperlipidemia No date: Hypertension 08/14/2015: Thrombocytosis ([place])  Past Surgical History: No date: CATARACT EXTRACTION No date: EYE SURGERY; Right     Comment:  Cataract Extraction with IOL No date: RETINAL LASER PROCEDURE  BMI    Body Mass Index:  19.46 kg/m      Reproductive/Obstetrics negative OB ROS                            Anesthesia  Physical Anesthesia Plan  ASA: III  Anesthesia Plan: General ETT   Post-op Pain Management:    Induction: Intravenous  PONV Risk Score and Plan: 2 and Ondansetron, Dexamethasone and Propofol infusion  Airway Management Planned: Oral ETT and Video Laryngoscope Planned  Additional Equipment:   Intra-op Plan:   Post-operative Plan: Extubation in OR  Informed Consent: I have reviewed the patients History and Physical, chart, labs and discussed the procedure including the risks, benefits and alternatives for the proposed anesthesia with the patient or authorized representative who has indicated his/her understanding and acceptance.   Dental Advisory Given  Plan Discussed with: Anesthesiologist, CRNA and Surgeon  Anesthesia Plan Comments: (Patient consented for risks of anesthesia including but not limited to:  - adverse reactions to medications - damage to teeth, lips or other oral mucosa - sore throat or hoarseness - Damage to heart, brain, lungs or loss of life  Patient voiced understanding.)        Anesthesia Quick Evaluation

## 2016-09-24 NOTE — Transfer of Care (Signed)
Immediate Anesthesia Transfer of Care Note  Patient: Garrett Yoder.  Procedure(s) Performed: Procedure(s): ANTERIOR CERVICAL DECOMPRESSION/DISCECTOMY FUSION 3 LEVELS-C4-7 (N/A)  Patient Location: PACU  Anesthesia Type:General  Level of Consciousness: sedated  Airway & Oxygen Therapy: Patient Spontanous Breathing and Patient connected to face mask oxygen  Post-op Assessment: Report given to RN and Post -op Vital signs reviewed and stable  Post vital signs: Reviewed and stable  Last Vitals:  Vitals:   09/24/16 0917 09/24/16 1340  BP: (!) 162/63 (!) 158/80  Pulse: 82 96  Resp: 16 18  Temp:  (!) 36 C    Complications: No apparent anesthesia complications

## 2016-09-24 NOTE — Progress Notes (Signed)
Patient states not relief from PO pain medication and muscle relaxer administered approx one hour ago. MD contacted and informed of patient's complaints. MD ordered one time dose 300mg  Gabapentin PO, one time/ additional dose 500mg  Robaxin PO and 0.5mg  Dilaudid IV q3 hrs PRN X 2 doses. Also per verbal order, Gabapentin and Robaxin will be given, once verified by Rx. Will continue to monitor patient status and pain

## 2016-09-24 NOTE — Op Note (Signed)
Indications: cervical spondylosis with myelopathy  Findings: cervical stenosis, improvement of monitoring signals  Preoperative Diagnosis: Cervical myelopathy Postoperative Diagnosis: same   EBL: 50 ml IVF: 1000 ml Drains: none Disposition: Extubated and Stable to PACU Complications: none  No foley catheter was placed.   Preoperative Note:   Risks of surgery discussed include: infection, bleeding, stroke, coma, death, paralysis, CSF leak, nerve/spinal cord injury, numbness, tingling, weakness, complex regional pain syndrome, recurrent stenosis and/or disc herniation, vascular injury, development of instability, neck/back pain, need for further surgery, persistent symptoms, development of deformity, and the risks of anesthesia. They understood these risks and have agreed to proceed.  Operative Note:   Operative Procedure: 1. Anterior Cervical Discectomy C4-5 including bilateral foraminotomies and end plate preparation  2. Anterior Cervical Discectomy C5-6 including bilateral foraminotomies and end plate preparation  3. Anterior Cervical Discectomy C6-7 including bilateral foraminotomies and end plate preparation 4. Anterior Spinal Instrumentation C4 to 7  5. Anterior arthrodesis from C4 to C7 with placement of a biomechanical device at each level 6. Use of the operative microscope 7. Use of intraoperative flouroscopy  PROCEDURE IN DETAIL: After obtaining informed consent, the patient taken to the operating room, placed in supine position, general anesthesia induced.  The patient had a small shoulder roll placed behind their shoulders.  The patient received preop antibiotics and IV Decadron.  The patient had a neck incision outlined, was prepped and draped in usual sterile fashion. The incision was injected with local anesthetic.   An incision was opened, dissection taken down medial to the carotid artery and jugular vein, lateral to the trachea and esophagus.  The prevertebral fascia  identified and a localizing x-ray demonstrated the correct level.  The longus colli were dissected laterally, and self-retaining retractors placed to open the operative field. The microscope was then brought into the field.  With this complete, distractor pins were placed in the vertebral bodies of C4, C6, and C7. The distractor was placed from C4-6, and the anuli at C4-5 and C5-6 were opened using a bovie.  Curettes and pituitary rongeurs used to remove the majority of disk, then the drill was used to remove the posterior osteophyte and begin the foraminotomies. The nerve hook was used to elevate the posterior longitudinal ligament, which was then removed with Kerrison rongeurs. The microblunt nerve hook could be passed out the foramen bilateralyl at each level.   Meticulous hemostasis obtained. A biomechanical device (Nuvasive Cohere 7 mm height x 16 mm width by 14 mm depth) was placed at C4/5. A second biomechanical device (Nuvasive Cohere 7 mm height x 16 mm width by 14 mm depth) was placed at C5/6. Each device had been filled with allograft for aid in arthrodesis.  The caspar distractor was removed and placed at C6-7. The pin at C4 was removed and bone wax used for hemostasis. The C6-7 disc was opened with the bovie. Curettes and pituitary rongeurs used to remove the majority of disk, then the drill was used to remove the posterior osteophyte and begin the foraminotomies. The nerve hook was used to elevate the posterior longitudinal ligament, which was then removed with Kerrison rongeurs. The microblunt nerve hook could be passed out the foramen bilateral at each level.   Meticulous hemostasis obtained.  A biomechanical device (Nuvasive Cohere 7 mm height x 16 mm width by 14 mm depth) was placed at C6/7.  A four segment, three level plate Nuvasive Archon 77mm was chosen.  Two screws placed in the vertebral bodies of  all four segments, respectively making sure the screws were behind the locking mechanism.   Final AP and lateral radiographs were taken.   With everything in good position, the wound was irrigated copiously with bacitracin-containing solution and meticulous hemostasis obtained.  Wound was closed in 2 layers using interrupted inverted 3-0 Vicryl sutures.  The wound was dressed with dermabond, the head of bed at 30 degrees, taken to recovery room in stable condition.  No new postop neurological deficits were identified.  All counts were correct at the end of the case. Monitoring improved during the case.  I performed the entire procedure with the assistance of Marin Olp PA as an Pensions consultant.  Meade Maw MD

## 2016-09-24 NOTE — Anesthesia Procedure Notes (Signed)
Procedure Name: Intubation Date/Time: 09/24/2016 10:15 AM Performed by: Doreen Salvage Pre-anesthesia Checklist: Patient identified, Patient being monitored, Timeout performed, Emergency Drugs available and Suction available Patient Re-evaluated:Patient Re-evaluated prior to induction Oxygen Delivery Method: Circle system utilized Preoxygenation: Pre-oxygenation with 100% oxygen Induction Type: IV induction Ventilation: Mask ventilation without difficulty Laryngoscope Size: Mac, 4 and McGraph Grade View: Grade I Tube type: Oral Tube size: 7.0 mm Number of attempts: 1 Airway Equipment and Method: Stylet Placement Confirmation: ETT inserted through vocal cords under direct vision,  positive ETCO2 and breath sounds checked- equal and bilateral Secured at: 21 cm Tube secured with: Tape Dental Injury: Teeth and Oropharynx as per pre-operative assessment  Comments: . Pt's head and neck remains in a neutral position prior to and during induction. Mcgrath utilized to maintain head/neck  in a neutral position during intubation

## 2016-09-24 NOTE — Anesthesia Post-op Follow-up Note (Signed)
Anesthesia QCDR form completed.        

## 2016-09-25 ENCOUNTER — Encounter: Payer: Self-pay | Admitting: Neurosurgery

## 2016-09-25 DIAGNOSIS — M4712 Other spondylosis with myelopathy, cervical region: Secondary | ICD-10-CM | POA: Diagnosis not present

## 2016-09-25 LAB — GLUCOSE, CAPILLARY
GLUCOSE-CAPILLARY: 275 mg/dL — AB (ref 65–99)
GLUCOSE-CAPILLARY: 188 mg/dL — AB (ref 65–99)

## 2016-09-25 MED ORDER — METHOCARBAMOL 500 MG PO TABS: 500.0000 mg | ORAL_TABLET | Freq: Four times a day (QID) | ORAL | 0 refills | Status: DC | PRN

## 2016-09-25 MED ORDER — HYDROMORPHONE HCL 1 MG/ML IJ SOLN: 0.5000 mg | INTRAMUSCULAR | 0 refills | Status: DC | PRN

## 2016-09-25 MED ORDER — MENTHOL 3 MG MT LOZG: 1.0000 | LOZENGE | OROMUCOSAL | 12 refills | Status: DC | PRN

## 2016-09-25 MED ORDER — OXYCODONE HCL 5 MG PO TABS: 5.0000 mg | ORAL_TABLET | ORAL | 0 refills | Status: DC | PRN

## 2016-09-25 NOTE — Discharge Summary (Signed)
  History: Garrett Yoder. is here for cervical spondylosis with myelopathy s/p C4-7 ACDF POD1. He did not sleep well the first night due to pain level of 7+in the anterior and posterior neck. Pain has been gradually decreasing with oxycodone, tylenol, and muscle relaxer. Symptoms prior to surgery have resolved. He is able to ambulate, swallow, and urinate without issue. He is slowly resuming to solid food.   Physical Exam: Vitals:   09/25/16 1142 09/25/16 1156  BP:  (!) 116/49  Pulse: 87 90  Resp:  20  Temp:  99.1 F (37.3 C)  SpO2: 97% 94%    AA Ox3 CNI  Strength:5/5 throughout, sensation intact upper and lower extremities  Data:   Recent Labs Lab 09/19/16 1025  NA 137  K 4.2  CL 99*  CO2 27  BUN 25*  CREATININE 1.16  GLUCOSE 197*  CALCIUM 9.6   No results for input(s): AST, ALT, ALKPHOS in the last 168 hours.  Invalid input(s): TBILI    Recent Labs Lab 09/19/16 1025  WBC 7.2  HGB 12.1*  HCT 35.7*  PLT 566*    Recent Labs Lab 09/19/16 1025  APTT 27  INR 0.85         Other tests/results:  EXAM: CERVICAL SPINE - 2-3 VIEW  COMPARISON:  MRI of the cervical spine performed 09/09/2016  FINDINGS: Anterior cervical spinal fusion is noted at C4-C7. No fractures are seen. Remaining intervertebral disc spaces are preserved. The visualized lung apices are clear. Prevertebral soft tissues are within normal limits.  IMPRESSION: Status post anterior cervical spinal fusion at C4-C7.   Electronically Signed   By: Garald Balding M.D.   On: 09/24/2016 21:41  Assessment/Plan:  Garrett Yoder. is doing well overall POD1 ACDF. He is experiencing pain in the anterior and posterior neck but assured him that this expected with his surgery. Pain is gradually improving with medication management. Symptoms prior to surgery have resolved. Patient will follow up in the office in approximately 2 weeks to gauge progress. Advised patient and wife that if  they have any questions or concerns to contact the office.   Marin Olp PA-C Department of Neurosurgery

## 2016-09-25 NOTE — Discharge Instructions (Signed)

## 2016-09-25 NOTE — Progress Notes (Signed)
Pt discharged to home via wheelchair per MD order without incident accompanied by wife. Pt discharged with prescription of oxycodone. Prior to d/c all discharge teachings done both written and verbal with wife and patient. All questions answered. Pt and wife verbalize understanding of all teachings and agree to comply.

## 2016-09-25 NOTE — Progress Notes (Signed)
OT Cancellation Note  Patient Details Name: Garrett Yoder. MRN: [id number] DOB: [birth year]   Cancelled Treatment:    Reason Eval/Treat Not Completed: Patient at procedure or test/ unavailable. Order received, chart reviewed. Pt/family in meeting. Will re-attempt OT evaluation at later date/time as pt is available.  Jeni Salles, MPH, MS, OTR/L ascom [phone] 09/25/16, 1:26 PM

## 2016-09-25 NOTE — Evaluation (Signed)
Physical Therapy Evaluation Patient Details Name: Garrett Yoder. MRN: [id number] DOB: [birth year] Today's Date: 09/25/2016   History of Present Illness  Pt is a 71 y.o.malediagnosed with cervical spondylosis and myelopathy and is s/p C4-7 ACDF. Upper extremity symptoms have improved, however he complains of persistent neck pain.      Clinical Impression  Pt presents with mild to moderate deficits in strength, transfers, gait, balance, and activity tolerance.  Light touch and proprioception intact to BLEs and BUEs.  Pt Ind with bed mobility tasks without extra time or effort required.  Pt SBA with transfers with fair standing balance without UE support.  Pt able to amb 150' with RW with SBA with slow cadence but no LOB.  Pt CGA up/down 2 steps with one rail which appeared effortful but no LOB.  During second amb pt only able to amb 70' before requiring to take a sitting rest break in hall.  Pt requested to be wheeled back to room in recliner.  SpO2 and HR WNL during session with pt's only complaints being that of fatigue and slightly increased neck pain but no light headedness, dizziness, SOB, or other symptoms.  Pt will benefit from HHPT services upon discharge to address above deficits for decreased caregiver assistance and safe return to PLOF.      Follow Up Recommendations Home health PT    Equipment Recommendations  None recommended by PT    Recommendations for Other Services       Precautions / Restrictions Precautions Precautions: Fall;Other (comment) Precaution Comments: Cervical precautions s/p ACDF Restrictions Weight Bearing Restrictions: No      Mobility  Bed Mobility Overal bed mobility: Independent                Transfers Overall transfer level: Needs assistance Equipment used: Rolling walker (2 wheeled) Transfers: Sit to/from Stand Sit to Stand: Supervision         General transfer comment: Pt steady upon standing from EOB with min verbal cues for  sequencing  Ambulation/Gait Ambulation/Gait assistance: Supervision Ambulation Distance (Feet): 150 Feet Assistive device: Rolling walker (2 wheeled) Gait Pattern/deviations: Step-through pattern;Decreased step length - right;Decreased step length - left   Gait velocity interpretation: Below normal speed for age/gender General Gait Details: Slow cadence with gait but steady without LOB; pt fatigued during second amb session and required seated rest break in hall.  Stairs Stairs: Yes Stairs assistance: Min guard Stair Management: One rail Right Number of Stairs: 2 General stair comments: Pt and spouse education on proper gaurding technique for ascending/descending stairs; pt steady during stair training without LOB  Wheelchair Mobility    Modified Rankin (Stroke Patients Only)       Balance Overall balance assessment: Needs assistance Sitting-balance support: No upper extremity supported;Feet supported Sitting balance-Leahy Scale: Normal     Standing balance support: No upper extremity supported Standing balance-Leahy Scale: Fair                               Pertinent Vitals/Pain Pain Assessment: 0-10 Pain Score: 4  Pain Location: neck Pain Descriptors / Indicators: Aching Pain Intervention(s): Premedicated before session;Monitored during session;Limited activity within patient's tolerance    Home Living Family/patient expects to be discharged to:: Private residence Living Arrangements: Spouse/significant other Available Help at Discharge: Family;Available 24 hours/day Type of Home: House Home Access: Stairs to enter Entrance Stairs-Rails: None Entrance Stairs-Number of Steps: 2 Home Layout: One level Home Equipment:  Walker - 2 wheels      Prior Function Level of Independence: Independent         Comments: Ind amb community distances without AD, Ind with ADLs, no fall history     Hand Dominance   Dominant Hand: Right    Extremity/Trunk  Assessment        Lower Extremity Assessment Lower Extremity Assessment: Generalized weakness       Communication   Communication: No difficulties  Cognition Arousal/Alertness: Awake/alert Behavior During Therapy: WFL for tasks assessed/performed Overall Cognitive Status: Within Functional Limits for tasks assessed                                        General Comments      Exercises Other Exercises Other Exercises: Static balance training with feet apart, together, and semi-tandem with combinations of eyes open and eyes closed with fair stability grossly   Assessment/Plan    PT Assessment Patient needs continued PT services  PT Problem List Decreased balance;Decreased activity tolerance;Decreased strength       PT Treatment Interventions DME instruction;Gait training;Stair training;Functional mobility training;Neuromuscular re-education;Balance training;Therapeutic exercise;Therapeutic activities;Patient/family education    PT Goals (Current goals can be found in the Care Plan section)  Acute Rehab PT Goals Patient Stated Goal: To get stronger PT Goal Formulation: With patient Time For Goal Achievement: 10/08/16 Potential to Achieve Goals: Good    Frequency BID   Barriers to discharge        Co-evaluation               AM-PAC PT "6 Clicks" Daily Activity  Outcome Measure Difficulty turning over in bed (including adjusting bedclothes, sheets and blankets)?: None Difficulty moving from lying on back to sitting on the side of the bed? : None Difficulty sitting down on and standing up from a chair with arms (e.g., wheelchair, bedside commode, etc,.)?: A Little Help needed moving to and from a bed to chair (including a wheelchair)?: A Little Help needed walking in hospital room?: A Little Help needed climbing 3-5 steps with a railing? : A Little 6 Click Score: 20    End of Session Equipment Utilized During Treatment: Gait belt Activity  Tolerance: Patient limited by fatigue Patient left: in chair;with call bell/phone within reach;with family/visitor present Nurse Communication: Mobility status;Other (comment) (Nursing to reapply SCDs as needed) PT Visit Diagnosis: Muscle weakness (generalized) (M62.81)    Time: 1005-1040 PT Time Calculation (min) (ACUTE ONLY): 35 min   Charges:   PT Evaluation $PT Eval Low Complexity: 1 Low PT Treatments $Gait Training: 8-22 mins   PT G Codes:   PT G-Codes **NOT FOR INPATIENT CLASS** Functional Assessment Tool Used: AM-PAC 6 Clicks Basic Mobility Functional Limitation: Mobility: Walking and moving around Mobility: Walking and Moving Around Current Status (E1740): At least 20 percent but less than 40 percent impaired, limited or restricted Mobility: Walking and Moving Around Goal Status [phone]): At least 1 percent but less than 20 percent impaired, limited or restricted    D. Scott Kimley Apsey PT, DPT 09/25/16, 11:58 AM

## 2016-09-25 NOTE — Progress Notes (Signed)
  History: Garrett Spikes. is here for cervical spondylosis with myelopathy s/p C4-7 ACDF POD1. He complains of anterior neck pain at the incision site and posterior neck pain with movement. Pain prevented him from sleeping more than 10 minutes last night even with pain medication with a pain score of 7+. Combination of tylenol, oxycodone, and robaxin has decreased his pain to 6+. Other than neck pain, he is doing well. Upper extremity symptoms prior to surgery have resolved.    Physical Exam: Vitals:   09/24/16 2314 09/25/16 0417  BP: 137/65 (!) 110/49  Pulse: 94 77  Resp: 18 16  Temp: 97.6 F (36.4 C) 99.2 F (37.3 C)    AA Ox3 CNI  Strength:5/5 throughout, sensation intact upper and lower extremities.  Incision site C/D/I.   Data:   Recent Labs Lab 09/19/16 1025  NA 137  K 4.2  CL 99*  CO2 27  BUN 25*  CREATININE 1.16  GLUCOSE 197*  CALCIUM 9.6   No results for input(s): AST, ALT, ALKPHOS in the last 168 hours.  Invalid input(s): TBILI    Recent Labs Lab 09/19/16 1025  WBC 7.2  HGB 12.1*  HCT 35.7*  PLT 566*    Recent Labs Lab 09/19/16 1025  APTT 27  INR 0.85         Other tests/results:  EXAM: CERVICAL SPINE - 2-3 VIEW  COMPARISON:  MRI of the cervical spine performed 09/09/2016  FINDINGS: Anterior cervical spinal fusion is noted at C4-C7. No fractures are seen. Remaining intervertebral disc spaces are preserved. The visualized lung apices are clear. Prevertebral soft tissues are within normal limits.  IMPRESSION: Status post anterior cervical spinal fusion at C4-C7.   Electronically Signed   By: Garald Balding M.D.   On: 09/24/2016 21:41   Assessment/Plan:  Garrett Spikes. is POD1 C4-7 ACDF. Upper extremity symptoms have improved, however he complains of persistent neck pain. He was receiving pain medication at the time of visit and had not walked. Will reevaluate in the afternoon for pain control and ambulation  status. Urination is requiring effort and patience.  Eating soft food without difficulty swallowing.   Garrett Olp PA-C Department of Neurosurgery

## 2016-09-25 NOTE — Anesthesia Postprocedure Evaluation (Signed)
Anesthesia Post Note  Patient: Garrett Yoder.  Procedure(s) Performed: Procedure(s) (LRB): ANTERIOR CERVICAL DECOMPRESSION/DISCECTOMY FUSION 3 LEVELS-C4-7 (N/A)  Patient location during evaluation: PACU Anesthesia Type: General Level of consciousness: awake and alert Pain management: pain level controlled Vital Signs Assessment: post-procedure vital signs reviewed and stable Respiratory status: spontaneous breathing, nonlabored ventilation, respiratory function stable and patient connected to nasal cannula oxygen Cardiovascular status: blood pressure returned to baseline and stable Postop Assessment: no signs of nausea or vomiting Anesthetic complications: no     Last Vitals:  Vitals:   09/24/16 2314 09/25/16 0417  BP: 137/65 (!) 110/49  Pulse: 94 77  Resp: 18 16  Temp: 36.4 C 37.3 C    Last Pain:  Vitals:   09/25/16 0650  TempSrc:   PainSc: Asleep                 Precious Haws Shauntell Iglesia

## 2016-09-25 NOTE — Care Management Obs Status (Signed)
[facility] NOTIFICATION   Patient Details  Name: Garrett Yoder. MRN: [id number] Date of Birth: [birth year]   Medicare Observation Status Notification Given:  Yes    Jolly Mango, RN 09/25/2016, 11:04 AM

## 2016-09-25 NOTE — Progress Notes (Signed)
OT Cancellation Note  Patient Details Name: Garrett Yoder. MRN: [id number] DOB: 18-Mar-1945   Cancelled Treatment:    Reason Eval/Treat Not Completed: Other (comment). Order received, chart reviewed. Upon attempt, pt out of room. Noted to have discharge summary in chart. Will continue to follow and re-attempt OT evaluation as appropriate prior to discharge.   Jeni Salles, MPH, MS, OTR/L ascom [phone] 09/25/16, 2:50 PM

## 2016-10-03 ENCOUNTER — Encounter: Payer: Self-pay | Admitting: Family Medicine

## 2016-10-30 ENCOUNTER — Emergency Department
Admission: EM | Admit: 2016-10-30 | Discharge: 2016-10-30 | Disposition: A | Discharge status: Home / Self Care | Payer: Medicare Other | Attending: Emergency Medicine | Admitting: Emergency Medicine

## 2016-10-30 ENCOUNTER — Encounter: Payer: Self-pay | Admitting: *Deleted

## 2016-10-30 DIAGNOSIS — E162 Hypoglycemia, unspecified: Secondary | ICD-10-CM | POA: Diagnosis present

## 2016-10-30 DIAGNOSIS — E11649 Type 2 diabetes mellitus with hypoglycemia without coma: Secondary | ICD-10-CM | POA: Insufficient documentation

## 2016-10-30 DIAGNOSIS — Z79899 Other long term (current) drug therapy: Secondary | ICD-10-CM | POA: Insufficient documentation

## 2016-10-30 DIAGNOSIS — I10 Essential (primary) hypertension: Secondary | ICD-10-CM | POA: Diagnosis not present

## 2016-10-30 DIAGNOSIS — Z794 Long term (current) use of insulin: Secondary | ICD-10-CM | POA: Diagnosis not present

## 2016-10-30 DIAGNOSIS — Z87891 Personal history of nicotine dependence: Secondary | ICD-10-CM | POA: Insufficient documentation

## 2016-10-30 LAB — COMPREHENSIVE METABOLIC PANEL
ALBUMIN: 3.7 g/dL (ref 3.5–5.0)
ALK PHOS: 117 U/L (ref 38–126)
ALT: 26 U/L (ref 17–63)
AST: 29 U/L (ref 15–41)
Anion gap: 11 (ref 5–15)
BILIRUBIN TOTAL: 0.4 mg/dL (ref 0.3–1.2)
BUN: 27 mg/dL — AB (ref 6–20)
CALCIUM: 9.2 mg/dL (ref 8.9–10.3)
CO2: 27 mmol/L (ref 22–32)
CREATININE: 0.89 mg/dL (ref 0.61–1.24)
Chloride: 97 mmol/L — ABNORMAL LOW (ref 101–111)
GFR calc Af Amer: >60 mL/min (ref >60)
GFR calc non Af Amer: >60 mL/min (ref >60)
GLUCOSE: 237 mg/dL — AB (ref 65–99)
Potassium: 4.5 mmol/L (ref 3.5–5.1)
Sodium: 135 mmol/L (ref 135–145)
TOTAL PROTEIN: 7 g/dL (ref 6.5–8.1)

## 2016-10-30 LAB — URINALYSIS, ROUTINE W REFLEX MICROSCOPIC
BILIRUBIN URINE: NEGATIVE
GLUCOSE, UA: 250 mg/dL — AB
HGB URINE DIPSTICK: NEGATIVE
Leukocytes, UA: NEGATIVE
NITRITE: NEGATIVE
PH: 7 (ref 5.0–8.0)
Protein, ur: 30 mg/dL — AB
SPECIFIC GRAVITY, URINE: 1.02 (ref 1.005–1.030)

## 2016-10-30 LAB — TROPONIN I: Troponin I: <0.03 ng/mL (ref <0.03)

## 2016-10-30 LAB — CBC
HEMATOCRIT: 31.6 % — AB (ref 40.0–52.0)
HEMOGLOBIN: 10.8 g/dL — AB (ref 13.0–18.0)
MCH: 30.7 pg (ref 26.0–34.0)
MCHC: 34 g/dL (ref 32.0–36.0)
MCV: 90.4 fL (ref 80.0–100.0)
Platelets: 728 10*3/uL — ABNORMAL HIGH (ref 150–440)
RBC: 3.5 MIL/uL — ABNORMAL LOW (ref 4.40–5.90)
RDW: 15.2 % — ABNORMAL HIGH (ref 11.5–14.5)
WBC: 11.5 10*3/uL — AB (ref 3.8–10.6)

## 2016-10-30 LAB — URINALYSIS, MICROSCOPIC (REFLEX)
Bacteria, UA: NONE SEEN
SQUAMOUS EPITHELIAL / LPF: NONE SEEN

## 2016-10-30 LAB — LIPASE, BLOOD: Lipase: 20 U/L (ref 11–51)

## 2016-10-30 LAB — GLUCOSE, CAPILLARY: Glucose-Capillary: 196 mg/dL — ABNORMAL HIGH (ref 65–99)

## 2016-10-30 NOTE — ED Notes (Signed)
Patient made aware of need of urine sample  

## 2016-10-30 NOTE — ED Triage Notes (Signed)
Pt reports decreased appetite after surgery in August, pt reports hypoglycemia the last 2 days, pt reports blood sugar last night 35, pt reports blood sugar was 101, pt reports generalized fatigue

## 2016-10-30 NOTE — ED Notes (Signed)
Patient reports he has lost 20 pounds in the last 2 weeks

## 2016-10-30 NOTE — Discharge Instructions (Signed)
As we discussed please decreased her daily Lantus/Levemir from 36 units to 25 units daily. Please keep a very strict glucose Diary, recording each finger stick read as well as what time you dose your Lantus/Levemir. Please follow-up with your doctor on Monday or as soon as possible for recheck and to discuss your insulin regimen further. Return to the emergency department for any further episodes of significant low blood glucose, or any other symptom personally concerning to yourself.
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
## 2016-11-03 ENCOUNTER — Other Ambulatory Visit: Payer: Self-pay

## 2016-11-03 MED ORDER — INSULIN PEN NEEDLE 32G X 6 MM MISC: 0 refills | Status: DC

## 2016-11-11 ENCOUNTER — Telehealth: Payer: Self-pay

## 2016-11-11 ENCOUNTER — Ambulatory Visit (INDEPENDENT_AMBULATORY_CARE_PROVIDER_SITE_OTHER): Payer: Medicare Other | Admitting: Family Medicine

## 2016-11-11 VITALS — BP 130/70 | HR 103 | Temp 98.2°F | Resp 14 | Wt 113.5 lb

## 2016-11-11 DIAGNOSIS — D649 Anemia, unspecified: Secondary | ICD-10-CM | POA: Diagnosis not present

## 2016-11-11 DIAGNOSIS — D473 Essential (hemorrhagic) thrombocythemia: Secondary | ICD-10-CM | POA: Diagnosis not present

## 2016-11-11 DIAGNOSIS — D75839 Thrombocytosis, unspecified: Secondary | ICD-10-CM

## 2016-11-11 DIAGNOSIS — R7989 Other specified abnormal findings of blood chemistry: Secondary | ICD-10-CM | POA: Diagnosis not present

## 2016-11-11 DIAGNOSIS — R634 Abnormal weight loss: Secondary | ICD-10-CM

## 2016-11-11 DIAGNOSIS — Z125 Encounter for screening for malignant neoplasm of prostate: Secondary | ICD-10-CM | POA: Diagnosis not present

## 2016-11-11 DIAGNOSIS — R17 Unspecified jaundice: Secondary | ICD-10-CM | POA: Diagnosis not present

## 2016-11-11 DIAGNOSIS — E11649 Type 2 diabetes mellitus with hypoglycemia without coma: Secondary | ICD-10-CM

## 2016-11-11 MED ORDER — INSULIN DETEMIR 100 UNIT/ML FLEXPEN: 19.0000 [IU] | PEN_INJECTOR | Freq: Every morning | SUBCUTANEOUS | 5 refills | Status: DC

## 2016-11-11 MED ORDER — GLUCAGON (RDNA) 1 MG IJ KIT: 1.0000 mg | PACK | Freq: Once | INTRAMUSCULAR | 2 refills | Status: DC | PRN
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
## 2016-11-11 NOTE — Assessment & Plan Note (Signed)
Check platelets ?

## 2016-11-11 NOTE — Telephone Encounter (Signed)
Patient thought you were going to give rx for glucose meter and strips, please advise

## 2016-11-11 NOTE — Telephone Encounter (Signed)
It was never mentioned to my recall during our visit He is already testing his glucose and brought in the readings Medicare will only pay for a new meter every 5 years Does he need a whole new meter? I have down that he is using relion ultima test strips If he just needs those, I can re-order more If he needs a whole new machine, find out what brand so we can update the med list with correct strips too Thank you

## 2016-11-11 NOTE — Assessment & Plan Note (Signed)
Used to see heme-onc doctor, will recheck

## 2016-11-11 NOTE — Assessment & Plan Note (Signed)
With fluctuating glucose; lower insulin needs; episodes of hypoglycemia; needs to see diabetic specialist; monitor glucose carefully, checking FSBS 5x a day while fluctuating; avoid driving while fluctuating, safety tips discussed; carry food with him; glucagon Rx prescribed, keep with him

## 2016-11-11 NOTE — Patient Instructions (Addendum)
Please get labs done at your earliest convenience Monitor your sugars closely We'll get the CT scan and have you see the diabetes specialist Do not drive if fluctuating sugars, and keep the Glucagon kit and food handy with you at all times  Diabetes Mellitus and Food It is important for you to manage your blood sugar (glucose) level. Your blood glucose level can be greatly affected by what you eat. Eating healthier foods in the appropriate amounts throughout the day at about the same time each day will help you control your blood glucose level. It can also help slow or prevent worsening of your diabetes mellitus. Healthy eating may even help you improve the level of your blood pressure and reach or maintain a healthy weight. General recommendations for healthful eating and cooking habits include:  Eating meals and snacks regularly. Avoid going long periods of time without eating to lose weight.  Eating a diet that consists mainly of plant-based foods, such as fruits, vegetables, nuts, legumes, and whole grains.  Using low-heat cooking methods, such as baking, instead of high-heat cooking methods, such as deep frying.  Work with your dietitian to make sure you understand how to use the Nutrition Facts information on food labels. How can food affect me? Carbohydrates Carbohydrates affect your blood glucose level more than any other type of food. Your dietitian will help you determine how many carbohydrates to eat at each meal and teach you how to count carbohydrates. Counting carbohydrates is important to keep your blood glucose at a healthy level, especially if you are using insulin or taking certain medicines for diabetes mellitus. Alcohol Alcohol can cause sudden decreases in blood glucose (hypoglycemia), especially if you use insulin or take certain medicines for diabetes mellitus. Hypoglycemia can be a life-threatening condition. Symptoms of hypoglycemia (sleepiness, dizziness, and  disorientation) are similar to symptoms of having too much alcohol. If your health care provider has given you approval to drink alcohol, do so in moderation and use the following guidelines:  Women should not have more than one drink per day, and men should not have more than two drinks per day. One drink is equal to: ? 12 oz of beer. ? 5 oz of wine. ? 1 oz of hard liquor.  Do not drink on an empty stomach.  Keep yourself hydrated. Have water, diet soda, or unsweetened iced tea.  Regular soda, juice, and other mixers might contain a lot of carbohydrates and should be counted.  What foods are not recommended? As you make food choices, it is important to remember that all foods are not the same. Some foods have fewer nutrients per serving than other foods, even though they might have the same number of calories or carbohydrates. It is difficult to get your body what it needs when you eat foods with fewer nutrients. Examples of foods that you should avoid that are high in calories and carbohydrates but low in nutrients include:  Trans fats (most processed foods list trans fats on the Nutrition Facts label).  Regular soda.  Juice.  Candy.  Sweets, such as cake, pie, doughnuts, and cookies.  Fried foods.  What foods can I eat? Eat nutrient-rich foods, which will nourish your body and keep you healthy. The food you should eat also will depend on several factors, including:  The calories you need.  The medicines you take.  Your weight.  Your blood glucose level.  Your blood pressure level.  Your cholesterol level.  You should eat a variety  of foods, including:  Protein. ? Lean cuts of meat. ? Proteins low in saturated fats, such as fish, egg whites, and beans. Avoid processed meats.  Fruits and vegetables. ? Fruits and vegetables that may help control blood glucose levels, such as apples, mangoes, and yams.  Dairy products. ? Choose fat-free or low-fat dairy products,  such as milk, yogurt, and cheese.  Grains, bread, pasta, and rice. ? Choose whole grain products, such as multigrain bread, whole oats, and brown rice. These foods may help control blood pressure.  Fats. ? Foods containing healthful fats, such as nuts, avocado, olive oil, canola oil, and fish.  Does everyone with diabetes mellitus have the same meal plan? Because every person with diabetes mellitus is different, there is not one meal plan that works for everyone. It is very important that you meet with a dietitian who will help you create a meal plan that is just right for you. This information is not intended to replace advice given to you by your health care provider. Make sure you discuss any questions you have with your health care provider. Document Released: 10/31/2004 Document Revised: 07/12/2015 Document Reviewed: 12/31/2012 Elsevier Interactive Patient Education  2017 [employer].

## 2016-11-12 ENCOUNTER — Telehealth: Payer: Self-pay

## 2016-11-12 ENCOUNTER — Telehealth: Payer: Self-pay | Admitting: Family Medicine

## 2016-11-12 NOTE — Telephone Encounter (Signed)
Mis interpreted with up front saff, he was referring to the glucagon.

## 2016-11-12 NOTE — Telephone Encounter (Signed)
Pt is being referred to endocrinologist. He received a call from someone but they are located in [place]. He would prefer to stay in [place]

## 2016-11-12 NOTE — Telephone Encounter (Signed)
I spoke with this patient to inform him that [facility] was booking out until December and he said he could not wait that long. I then asked if he wanted to go to [facility] and he said no again. So I told him that I would send it back to [facility] to see if they could get him in and he said ok.

## 2016-11-12 NOTE — Telephone Encounter (Signed)
Patient was informed that he has been scheduled to have both of his CTs on Tuesday, November 18, 2016 at  3:00pm at the [facility]. Patient was instructed to arrive at 2:45pm and not to have anything to eat or drink 4 hours prior.   Patient was also was instructed to pick up a prep kit before his procedure.

## 2016-11-13 ENCOUNTER — Ambulatory Visit: Payer: Medicare Other | Admitting: Endocrinology

## 2016-11-15 LAB — CBC WITH DIFFERENTIAL/PLATELET
BASOS: 0 %
Basophils Absolute: 0 10*3/uL (ref 0.0–0.2)
EOS (ABSOLUTE): 0.1 10*3/uL (ref 0.0–0.4)
Eos: 1 %
HEMATOCRIT: 31.7 % — AB (ref 37.5–51.0)
HEMOGLOBIN: 10.2 g/dL — AB (ref 13.0–17.7)
IMMATURE GRANS (ABS): 0 10*3/uL (ref 0.0–0.1)
Immature Granulocytes: 0 %
LYMPHS: 21 %
Lymphocytes Absolute: 1.4 10*3/uL (ref 0.7–3.1)
MCH: 30.5 pg (ref 26.6–33.0)
MCHC: 32.2 g/dL (ref 31.5–35.7)
MCV: 95 fL (ref 79–97)
MONOCYTES: 8 %
Monocytes Absolute: 0.5 10*3/uL (ref 0.1–0.9)
NEUTROS ABS: 4.5 10*3/uL (ref 1.4–7.0)
Neutrophils: 70 %
Platelets: 748 10*3/uL — ABNORMAL HIGH (ref 150–379)
RBC: 3.34 x10E6/uL — ABNORMAL LOW (ref 4.14–5.80)
RDW: 16.2 % — ABNORMAL HIGH (ref 12.3–15.4)
WBC: 6.5 10*3/uL (ref 3.4–10.8)

## 2016-11-15 LAB — PSA: Prostate Specific Ag, Serum: 2.6 ng/mL (ref 0.0–4.0)

## 2016-11-15 LAB — IRON,TIBC AND FERRITIN PANEL
Ferritin: 112 ng/mL (ref 30–400)
Iron Saturation: 23 % (ref 15–55)
Iron: 70 ug/dL (ref 38–169)
TIBC: 300 ug/dL (ref 250–450)
UIBC: 230 ug/dL (ref 111–343)

## 2016-11-15 LAB — T4, FREE: Free T4: 1.15 ng/dL (ref 0.82–1.77)

## 2016-11-15 LAB — TSH: TSH: 1.39 u[IU]/mL (ref 0.450–4.500)

## 2016-11-17 ENCOUNTER — Ambulatory Visit: Admit: 2016-11-17 | Payer: Medicare Other | Admitting: Gastroenterology

## 2016-11-17 SURGERY — COLONOSCOPY WITH PROPOFOL
Anesthesia: General

## 2016-11-18 ENCOUNTER — Telehealth: Payer: Self-pay | Admitting: Oncology

## 2016-11-18 ENCOUNTER — Ambulatory Visit: Admission: RE | Admit: 2016-11-18 | Payer: Medicare Other | Source: Ambulatory Visit

## 2016-11-18 ENCOUNTER — Telehealth: Payer: Self-pay | Admitting: Family Medicine

## 2016-11-18 ENCOUNTER — Encounter: Payer: Self-pay | Admitting: Family Medicine

## 2016-11-18 NOTE — Telephone Encounter (Signed)
Sherry from Dr Janese Banks office states you had spoken to the doctor about this patient. She states that they can get him in on Friday however she will need a official referral.  (F) [phone] (P) [phone]

## 2016-11-18 NOTE — Telephone Encounter (Signed)
Spoke to sherry at Dr. Janese Banks office and the referral has been put in.

## 2016-11-18 NOTE — Telephone Encounter (Signed)
Consult for Thrombocytosis. Ref by Dr [name]. Appt conf with patient and ref Dr via [facility]. New patient packet left at registration to be completed by patient. (rcvd referral via schd msg) MF

## 2016-11-20 ENCOUNTER — Ambulatory Visit
Admission: RE | Admit: 2016-11-20 | Discharge: 2016-11-20 | Disposition: A | Discharge status: Home / Self Care | Payer: Medicare Other | Source: Ambulatory Visit | Attending: Family Medicine | Admitting: Family Medicine

## 2016-11-20 DIAGNOSIS — R911 Solitary pulmonary nodule: Secondary | ICD-10-CM | POA: Insufficient documentation

## 2016-11-20 DIAGNOSIS — I2510 Atherosclerotic heart disease of native coronary artery without angina pectoris: Secondary | ICD-10-CM | POA: Diagnosis not present

## 2016-11-20 DIAGNOSIS — R634 Abnormal weight loss: Secondary | ICD-10-CM | POA: Diagnosis not present

## 2016-11-20 DIAGNOSIS — N400 Enlarged prostate without lower urinary tract symptoms: Secondary | ICD-10-CM | POA: Diagnosis not present

## 2016-11-20 MED ORDER — IOPAMIDOL (ISOVUE-300) INJECTION 61%
85.0000 mL | Freq: Once | INTRAVENOUS | Status: AC | PRN
  Administered 2016-11-20: 85 mL via INTRAVENOUS

## 2016-11-21 ENCOUNTER — Inpatient Hospital Stay: Payer: Medicare Other

## 2016-11-21 ENCOUNTER — Inpatient Hospital Stay: Payer: Medicare Other | Attending: Oncology | Admitting: Oncology

## 2016-11-21 ENCOUNTER — Encounter: Payer: Self-pay | Admitting: Oncology

## 2016-11-21 VITALS — BP 136/64 | HR 96 | Temp 97.8°F | Resp 12 | Wt 113.5 lb

## 2016-11-21 DIAGNOSIS — Z79899 Other long term (current) drug therapy: Secondary | ICD-10-CM | POA: Diagnosis not present

## 2016-11-21 DIAGNOSIS — D75839 Thrombocytosis, unspecified: Secondary | ICD-10-CM

## 2016-11-21 DIAGNOSIS — M654 Radial styloid tenosynovitis [de Quervain]: Secondary | ICD-10-CM | POA: Diagnosis not present

## 2016-11-21 DIAGNOSIS — I10 Essential (primary) hypertension: Secondary | ICD-10-CM | POA: Diagnosis not present

## 2016-11-21 DIAGNOSIS — N400 Enlarged prostate without lower urinary tract symptoms: Secondary | ICD-10-CM | POA: Diagnosis not present

## 2016-11-21 DIAGNOSIS — F419 Anxiety disorder, unspecified: Secondary | ICD-10-CM | POA: Insufficient documentation

## 2016-11-21 DIAGNOSIS — K219 Gastro-esophageal reflux disease without esophagitis: Secondary | ICD-10-CM | POA: Diagnosis not present

## 2016-11-21 DIAGNOSIS — Z87891 Personal history of nicotine dependence: Secondary | ICD-10-CM | POA: Insufficient documentation

## 2016-11-21 DIAGNOSIS — Z7982 Long term (current) use of aspirin: Secondary | ICD-10-CM | POA: Insufficient documentation

## 2016-11-21 DIAGNOSIS — D473 Essential (hemorrhagic) thrombocythemia: Secondary | ICD-10-CM | POA: Diagnosis not present

## 2016-11-21 DIAGNOSIS — F329 Major depressive disorder, single episode, unspecified: Secondary | ICD-10-CM | POA: Insufficient documentation

## 2016-11-21 DIAGNOSIS — Z794 Long term (current) use of insulin: Secondary | ICD-10-CM | POA: Diagnosis not present

## 2016-11-21 DIAGNOSIS — E119 Type 2 diabetes mellitus without complications: Secondary | ICD-10-CM | POA: Insufficient documentation

## 2016-11-21 DIAGNOSIS — D649 Anemia, unspecified: Secondary | ICD-10-CM | POA: Insufficient documentation

## 2016-11-21 DIAGNOSIS — E785 Hyperlipidemia, unspecified: Secondary | ICD-10-CM | POA: Diagnosis not present

## 2016-11-21 DIAGNOSIS — I700 Atherosclerosis of aorta: Secondary | ICD-10-CM | POA: Insufficient documentation

## 2016-11-21 NOTE — Progress Notes (Signed)
Patient states that he had disc surgery in his neck 8 weeks ago and still has some residual pain. He states that after his surgery he had no appetite and lost a lot of weight which led to some severe hypoglycemic episodes. He has since adjusted the insulin dose and is doing much better. Appetite has improves and weight loss has stopped. He declines the flu vaccine today and states that he will get it next week when he goes back to his PMD.

## 2016-11-21 NOTE — Progress Notes (Signed)
Hematology/Oncology Consult note [facility] [phone] [phone]  Patient Care Team: [name], MD as PCP - General (Family Medicine) Vernon Prey, MD as Referring Physician (Ophthalmology)   Name of the patient: Garrett Yoder  [id number]  [birth year]    Reason for referral- thrombocytosis   Referring physician- Dr. Enid Derry  Date of visit: 11/21/16   History of presenting illness- patient is a 71 year old Caucasian male with a past medical history significant for diabetes, GERD, hypertension hyperlipidemia anxiety among other medical problems he has been referred to [place] for thrombocytosis. Of note patient has always had thrombocytosis even dating back to 2013 when his platelet count was 484. Since then his platelet count had been ranging between 400's to 500s. In July 2018 his platelet count was 522 and then gradually rose to 566 seconds 728 and then 748 recently. White count has always been normal. He is also had a long-standing normocytic anemia and his hemoglobin ranges around 11. Most recent CBC from 11/14/2016 showed white count of 6.5, H&H of 10.2/31.7 with an MCV of 95 and a platelet count of 748. Iron studies show normal ferritin of 112 and iron saturation of 23% and TIBC was normal at 300  Patient has had a 12 pound weight loss over the last 3 months but states that most of it happened over last 2 months after his disk surgery 8 weeks ago. He states that his appetite is coming back and he is feeling better. He has seen Dr. Ma Hillock from our practice in the past for iron deficiency anemia  ECOG PS- 1  Pain scale- 3   Review of systems- Review of Systems  Constitutional: Positive for malaise/fatigue and weight loss. Negative for chills and fever.  HENT: Negative for congestion, ear discharge and nosebleeds.   Eyes: Negative for blurred vision.  Respiratory: Negative for cough, hemoptysis, sputum production, shortness of  breath and wheezing.   Cardiovascular: Negative for chest pain, palpitations, orthopnea and claudication.  Gastrointestinal: Negative for abdominal pain, blood in stool, constipation, diarrhea, heartburn, melena, nausea and vomiting.  Genitourinary: Negative for dysuria, flank pain, frequency, hematuria and urgency.  Musculoskeletal: Negative for back pain, joint pain and myalgias.  Skin: Negative for rash.  Neurological: Negative for dizziness, tingling, focal weakness, seizures, weakness and headaches.  Endo/Heme/Allergies: Does not bruise/bleed easily.  Psychiatric/Behavioral: Negative for depression and suicidal ideas. The patient does not have insomnia.     Allergies  Allergen Reactions  . Codeine     Other reaction(s): makes him sick to his stomach    Patient Active Problem List   Diagnosis Date Noted  . Cervical myelopathy ([place]) 09/24/2016  . Neck pain, chronic 08/21/2016  . DDD (degenerative disc disease), cervical 08/21/2016  . Facet arthropathy, cervical 08/21/2016  . De Quervain's tenosynovitis 08/21/2016  . Vertigo 10/06/2015  . Acid reflux 09/09/2015  . Colon cancer screening 08/14/2015  . Essential hypertension, benign 08/14/2015  . Type 2 diabetes mellitus ([place]) 08/14/2015  . Hyperlipidemia 08/14/2015  . Medication monitoring encounter 08/14/2015  . Anemia 08/14/2015  . Thrombocytosis ([place]) 08/14/2015     Past Medical History:  Diagnosis Date  . Anemia 08/14/2015  . Anxiety   . DDD (degenerative disc disease), cervical 08/21/2016  . Depression   . Diabetes mellitus without complication ([place])   . Facet arthropathy, cervical 08/21/2016  . GERD (gastroesophageal reflux disease)   . Hyperlipidemia   . Hypertension   . Thrombocytosis ([place]) 08/14/2015  Past Surgical History:  Procedure Laterality Date  . ANTERIOR CERVICAL DECOMP/DISCECTOMY FUSION N/A 09/24/2016   Procedure: ANTERIOR CERVICAL DECOMPRESSION/DISCECTOMY FUSION 3 LEVELS-C4-7;  Surgeon: Meade Maw, MD;  Location: [facility] ORS;  Service: Neurosurgery;  Laterality: N/A;  . CATARACT EXTRACTION    . EYE SURGERY Right    Cataract Extraction with IOL  . RETINAL LASER PROCEDURE      Social History   Social History  . Marital status: Married    Spouse name: N/A  . Number of children: N/A  . Years of education: N/A   Occupational History  . retired    Social History Main Topics  . Smoking status: Former Smoker    Packs/day: 1.00    Types: Cigarettes    Quit date: 07/19/1999  . Smokeless tobacco: Never Used  . Alcohol use No  . Drug use: No  . Sexual activity: Yes    Partners: Female   Other Topics Concern  . Not on file   Social History Narrative  . No narrative on file     Family History  Problem Relation Age of Onset  . Stroke Mother   . Diabetes Mellitus II Father   . Diabetes Sister   . Cancer Maternal Grandfather   . Stroke Paternal Grandmother      Current Outpatient Prescriptions:  .  acetaminophen (TYLENOL) 500 MG tablet, Take 1,000 mg by mouth every 6 (six) hours as needed., Disp: , Rfl:  .  aspirin EC 81 MG tablet, Take 81 mg by mouth daily., Disp: , Rfl:  .  bimatoprost (LUMIGAN) 0.01 % SOLN, Place 1 drop into both eyes at bedtime. , Disp: , Rfl:  .  glucose blood (RELION ULTIMA TEST) test strip, Check fingerstick blood sugar 3x a day; dx E11.9; LON 99 months, Disp: 100 each, Rfl: 5 .  Insulin Detemir (LEVEMIR FLEXTOUCH) 100 UNIT/ML Pen, Inject 19 Units into the skin every morning., Disp: 15 mL, Rfl: 5 .  Insulin Pen Needle (NOVOFINE) 32G X 6 MM MISC, USE DAILY AS DIRECTED, Disp: 100 each, Rfl: 0 .  irbesartan (AVAPRO) 150 MG tablet, TAKE 1 TABLET BY MOUTH  DAILY, Disp: 90 tablet, Rfl: 1 .  metFORMIN (GLUCOPHAGE) 1000 MG tablet, TAKE 1 TABLET BY MOUTH TWO  TIMES DAILY WITH A MEAL, Disp: 180 tablet, Rfl: 1 .  Multiple Vitamin (MULTI-VITAMINS) TABS, Take 1 tablet by mouth 2 (two) times daily. , Disp: , Rfl:  .  omeprazole (PRILOSEC) 20 MG capsule, Take  1 capsule (20 mg total) by mouth daily., Disp: 30 capsule, Rfl: 3 .  oxyCODONE (OXY IR/ROXICODONE) 5 MG immediate release tablet, Take 1 tablet (5 mg total) by mouth every 3 (three) hours as needed for severe pain or breakthrough pain., Disp: 30 tablet, Rfl: 0 .  Probiotic Product (PROBIOTIC DAILY) CAPS, Take 1 capsule by mouth every evening., Disp: , Rfl:  .  SIMBRINZA 1-0.2 % SUSP, Apply 1 drop to eye 3 (three) times daily. , Disp: , Rfl:  .  simvastatin (ZOCOR) 40 MG tablet, TAKE 1 TABLET BY MOUTH AT  BEDTIME, Disp: 90 tablet, Rfl: 1 .  glucagon (GLUCAGON EMERGENCY) 1 MG injection, Inject 1 mg into the vein once as needed. (Patient not taking: Reported on 11/21/2016), Disp: 1 each, Rfl: 2   Physical exam:  Vitals:   11/21/16 0838 11/21/16 0842  BP:  136/64  Pulse:  96  Resp:  12  Temp:  97.8 F (36.6 C)  TempSrc:  Temporal  Weight: 113  lb 8 oz (51.5 kg)    Physical Exam  Constitutional: He is oriented to person, place, and time and well-developed, well-nourished, and in no distress.  HENT:  Head: Normocephalic and atraumatic.  Neck scar from recent disk surgery  Eyes: Pupils are equal, round, and reactive to light. EOM are normal.  Neck: Normal range of motion.  Cardiovascular: Normal rate, regular rhythm and normal heart sounds.   Pulmonary/Chest: Effort normal and breath sounds normal.  Abdominal: Soft. Bowel sounds are normal.  No palpable hepatosplenomegaly  Lymphadenopathy:  No palpable cervical supraclavicular axillary or inguinal adenopathy  Neurological: He is alert and oriented to person, place, and time.  Skin: Skin is warm and dry.       CMP Latest Ref Rng & Units 10/30/2016  Glucose 65 - 99 mg/dL 237(H)  BUN 6 - 20 mg/dL 27(H)  Creatinine 0.61 - 1.24 mg/dL 0.89  Sodium 135 - 145 mmol/L 135  Potassium 3.5 - 5.1 mmol/L 4.5  Chloride 101 - 111 mmol/L 97(L)  CO2 22 - 32 mmol/L 27  Calcium 8.9 - 10.3 mg/dL 9.2  Total Protein 6.5 - 8.1 g/dL 7.0  Total Bilirubin  0.3 - 1.2 mg/dL 0.4  Alkaline Phos 38 - 126 U/L 117  AST 15 - 41 U/L 29  ALT 17 - 63 U/L 26   CBC Latest Ref Rng & Units 11/14/2016  WBC 3.4 - 10.8 x10E3/uL 6.5  Hemoglobin 13.0 - 17.7 g/dL 10.2(L)  Hematocrit 37.5 - 51.0 % 31.7(L)  Platelets 150 - 379 x10E3/uL 748(H)    No images are attached to the encounter.  Ct Chest W Contrast  Result Date: 11/20/2016 CLINICAL DATA:  Initial evaluation for unintentional weight loss, hypoglycemic episodes, fatigue. History of smoking. EXAM: CT CHEST, ABDOMEN, AND PELVIS WITH CONTRAST TECHNIQUE: Multidetector CT imaging of the chest, abdomen and pelvis was performed following the standard protocol during bolus administration of intravenous contrast. CONTRAST:  55m ISOVUE-300 IOPAMIDOL (ISOVUE-300) INJECTION 61% COMPARISON:  Prior CT from 08/31/2012. FINDINGS: CT CHEST FINDINGS Cardiovascular: Intrathoracic aorta of normal caliber and appearance without aneurysm or acute abnormality. Moderate aortic atherosclerosis. Partially visualized great vessels within normal limits. Heart size normal. Three vessel coronary artery calcifications. No pericardial effusion. Limited evaluation of the pulmonary arterial tree grossly unremarkable. Mediastinum/Nodes: Thyroid within normal limits. No pathologically enlarged mediastinal, hilar, or axillary lymph nodes identified. Esophagus within normal limits. Lungs/Pleura: Tracheobronchial tree is patent. Lungs are well inflated bilaterally. Minimal scattered atelectatic changes present dependently within the right lower lobe. No focal infiltrates. No pulmonary edema or pleural effusion. No pneumothorax. Single 5 mm nodule present at the medial left lower lobe (series 4, image 101), indeterminate. No other worrisome pulmonary nodule or mass. Musculoskeletal: External soft tissues within normal limits. No acute osseous abnormality. No worrisome lytic or blastic osseous lesions. Cervical ACDF partially visualized. CT ABDOMEN PELVIS  FINDINGS Hepatobiliary: Liver demonstrates a normal contrast enhanced appearance. Gallbladder partially contracted and within normal limits. No biliary dilatation. Pancreas: Pancreas within normal limits without mass lesion or acute inflammatory changes. No pancreatic ductal dilatation. Spleen: Spleen within normal limits. Adrenals/Urinary Tract: 15 mm right adrenal nodule not significantly changed from previous. Adrenal glands otherwise unremarkable. Kidneys equal in size with symmetric enhancement. No nephrolithiasis, hydronephrosis, or focal enhancing renal mass. No appreciable hydroureter. Bladder largely decompressed. Mild circumferential bladder wall thickening may be related incomplete distension and/ or chronic outlet obstruction. Stomach/Bowel: Stomach moderately distended with enteric contrast material within the gastric lumen. Stomach otherwise unremarkable. No evidence for  bowel obstruction. No abnormal wall thickening, mucosal enhancement, or inflammatory fat stranding seen about the bowels. No findings to suggest acute appendicitis. Vascular/Lymphatic: Fairly extensive aorto bi-iliac atherosclerotic disease. No aneurysm. Normal intravascular enhancement seen throughout the intra- abdominal aorta and its branch vessels. No pathologically enlarged intra- abdominal or pelvic lymph nodes identified. Reproductive: Prostate is markedly enlarged measuring up to 5.1 cm in transverse diameter, invaginating upon the base of the bladder. Irregular cystic change present superiorly (series 2, image 107). This has progressed from previous. A superimposed prostatic or even bladder mass would be difficult to exclude. Other: No free air or fluid. Musculoskeletal: Mild soft tissue thickening within the subcutaneous fat of the ventral abdomen, likely related to injection sites. No acute osseous abnormality. No worrisome lytic or blastic osseous lesions. IMPRESSION: 1. Marked enlargement of the prostate, invaginating upon  the base of the bladder, increased in prominence relative to 2014. While these findings may simply be related to underlying benign prostatic hypertrophy, a superimposed prostatic or even bladder mass would be difficult to exclude. Correlation with PSA recommended. Additionally, urology consultation suggested as clinically warranted. 2. Mild diffuse circumferential bladder wall thickening, like related to incomplete distension and/or chronic outlet obstruction. 3. Stable indeterminate 15 mm right adrenal nodule, unchanged as compared to multiple previous studies, and likely benign. 4. Three vessel coronary artery calcifications with moderate aorto bi-iliac atherosclerotic disease. 5. 5 mm left lower lobe pulmonary nodule, indeterminate. No follow-up needed if patient is low-risk. Non-contrast chest CT can be considered in 12 months if patient is high-risk. This recommendation follows the consensus statement: Guidelines for Management of Incidental Pulmonary Nodules Detected on CT Images: From the Fleischner Society 2017; Radiology 2017; 284:228-243. Electronically Signed   By: Jeannine Boga M.D.   On: 11/20/2016 18:00   Ct Abdomen Pelvis W Contrast  Result Date: 11/20/2016 CLINICAL DATA:  Initial evaluation for unintentional weight loss, hypoglycemic episodes, fatigue. History of smoking. EXAM: CT CHEST, ABDOMEN, AND PELVIS WITH CONTRAST TECHNIQUE: Multidetector CT imaging of the chest, abdomen and pelvis was performed following the standard protocol during bolus administration of intravenous contrast. CONTRAST:  49m ISOVUE-300 IOPAMIDOL (ISOVUE-300) INJECTION 61% COMPARISON:  Prior CT from 08/31/2012. FINDINGS: CT CHEST FINDINGS Cardiovascular: Intrathoracic aorta of normal caliber and appearance without aneurysm or acute abnormality. Moderate aortic atherosclerosis. Partially visualized great vessels within normal limits. Heart size normal. Three vessel coronary artery calcifications. No pericardial  effusion. Limited evaluation of the pulmonary arterial tree grossly unremarkable. Mediastinum/Nodes: Thyroid within normal limits. No pathologically enlarged mediastinal, hilar, or axillary lymph nodes identified. Esophagus within normal limits. Lungs/Pleura: Tracheobronchial tree is patent. Lungs are well inflated bilaterally. Minimal scattered atelectatic changes present dependently within the right lower lobe. No focal infiltrates. No pulmonary edema or pleural effusion. No pneumothorax. Single 5 mm nodule present at the medial left lower lobe (series 4, image 101), indeterminate. No other worrisome pulmonary nodule or mass. Musculoskeletal: External soft tissues within normal limits. No acute osseous abnormality. No worrisome lytic or blastic osseous lesions. Cervical ACDF partially visualized. CT ABDOMEN PELVIS FINDINGS Hepatobiliary: Liver demonstrates a normal contrast enhanced appearance. Gallbladder partially contracted and within normal limits. No biliary dilatation. Pancreas: Pancreas within normal limits without mass lesion or acute inflammatory changes. No pancreatic ductal dilatation. Spleen: Spleen within normal limits. Adrenals/Urinary Tract: 15 mm right adrenal nodule not significantly changed from previous. Adrenal glands otherwise unremarkable. Kidneys equal in size with symmetric enhancement. No nephrolithiasis, hydronephrosis, or focal enhancing renal mass. No appreciable hydroureter. Bladder largely decompressed. Mild  circumferential bladder wall thickening may be related incomplete distension and/ or chronic outlet obstruction. Stomach/Bowel: Stomach moderately distended with enteric contrast material within the gastric lumen. Stomach otherwise unremarkable. No evidence for bowel obstruction. No abnormal wall thickening, mucosal enhancement, or inflammatory fat stranding seen about the bowels. No findings to suggest acute appendicitis. Vascular/Lymphatic: Fairly extensive aorto bi-iliac  atherosclerotic disease. No aneurysm. Normal intravascular enhancement seen throughout the intra- abdominal aorta and its branch vessels. No pathologically enlarged intra- abdominal or pelvic lymph nodes identified. Reproductive: Prostate is markedly enlarged measuring up to 5.1 cm in transverse diameter, invaginating upon the base of the bladder. Irregular cystic change present superiorly (series 2, image 107). This has progressed from previous. A superimposed prostatic or even bladder mass would be difficult to exclude. Other: No free air or fluid. Musculoskeletal: Mild soft tissue thickening within the subcutaneous fat of the ventral abdomen, likely related to injection sites. No acute osseous abnormality. No worrisome lytic or blastic osseous lesions. IMPRESSION: 1. Marked enlargement of the prostate, invaginating upon the base of the bladder, increased in prominence relative to 2014. While these findings may simply be related to underlying benign prostatic hypertrophy, a superimposed prostatic or even bladder mass would be difficult to exclude. Correlation with PSA recommended. Additionally, urology consultation suggested as clinically warranted. 2. Mild diffuse circumferential bladder wall thickening, like related to incomplete distension and/or chronic outlet obstruction. 3. Stable indeterminate 15 mm right adrenal nodule, unchanged as compared to multiple previous studies, and likely benign. 4. Three vessel coronary artery calcifications with moderate aorto bi-iliac atherosclerotic disease. 5. 5 mm left lower lobe pulmonary nodule, indeterminate. No follow-up needed if patient is low-risk. Non-contrast chest CT can be considered in 12 months if patient is high-risk. This recommendation follows the consensus statement: Guidelines for Management of Incidental Pulmonary Nodules Detected on CT Images: From the Fleischner Society 2017; Radiology 2017; 284:228-243. Electronically Signed   By: Jeannine Boga  M.D.   On: 11/20/2016 18:00    Assessment and plan- Patient is a 71 y.o. male referred for thrombocytosis  Patient has both normocytic anemia and thrombocytosis. Thrombocytosis has been chronic atleast dating back to 2013 and has gradually increased from high 400's to 700's recently. Patent also noted to have long standing normocytic anemia with hb between 10-11  I will do a complete anemia work up including CMP, b12, folate, retic count, haptoglobin, Myeloma panel. Recent iron studies and TSH was within normal limits  With regards to thrombocytosis- this could be primary due to bone marrow disorder or secondary( reactive causes). Long standing thrombocytosis which has remained between 400s to 500s. The recent increase to the 700s could be secondary to recent surgery. I will check ESR, pathology review of smear, BCR ABL testing, JAK2 mutation testing along with CALR and MPL testing. Patient wishes to get this blood work done next week and I will see him back in 2 weeks to discuss the results of the blood work   Thank you for this kind referral and the opportunity to participate in the care of this patient   Visit Diagnosis 1. Thrombocytosis ([place])     Dr. Randa Evens, MD, MPH [facility] Pager- [phone] 11/21/2016 9:16 AM

## 2016-11-24 ENCOUNTER — Other Ambulatory Visit: Payer: Self-pay | Admitting: Family Medicine

## 2016-11-24 ENCOUNTER — Telehealth: Payer: Self-pay | Admitting: Family Medicine

## 2016-11-24 MED ORDER — GLUCOSE BLOOD VI STRP: ORAL_STRIP | 5 refills | Status: DC

## 2016-11-24 NOTE — Progress Notes (Signed)
New Rx sent to pharmacy with ICD-10 on script

## 2016-11-24 NOTE — Telephone Encounter (Signed)
I called patient to discuss his heme-onc appointment and the CT scan results He says the visit went pretty well He is going back over to the cancer center tomorrow as a matter of fact Her initial thinking is that the platelet count is high from trying to get over surgery, infection, etc, a reaction to everything he's been through; he sees her October 19th He used to get iron infusions a few years ago at the cancer center, but that doctor is gone; he is seeing Dr. 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

## 2016-11-24 NOTE — Telephone Encounter (Signed)
Call patient about CT results, Friday's heme-onc appt

## 2016-11-25 ENCOUNTER — Encounter: Payer: Self-pay | Admitting: Family Medicine

## 2016-11-25 ENCOUNTER — Inpatient Hospital Stay: Payer: Medicare Other

## 2016-11-25 ENCOUNTER — Other Ambulatory Visit (INDEPENDENT_AMBULATORY_CARE_PROVIDER_SITE_OTHER): Payer: Medicare Other

## 2016-11-25 ENCOUNTER — Ambulatory Visit (INDEPENDENT_AMBULATORY_CARE_PROVIDER_SITE_OTHER): Payer: Medicare Other | Admitting: Family Medicine

## 2016-11-25 DIAGNOSIS — R9341 Abnormal radiologic findings on diagnostic imaging of renal pelvis, ureter, or bladder: Secondary | ICD-10-CM | POA: Diagnosis not present

## 2016-11-25 DIAGNOSIS — I700 Atherosclerosis of aorta: Secondary | ICD-10-CM | POA: Insufficient documentation

## 2016-11-25 DIAGNOSIS — R911 Solitary pulmonary nodule: Secondary | ICD-10-CM | POA: Diagnosis not present

## 2016-11-25 DIAGNOSIS — I2510 Atherosclerotic heart disease of native coronary artery without angina pectoris: Secondary | ICD-10-CM | POA: Diagnosis not present

## 2016-11-25 DIAGNOSIS — N138 Other obstructive and reflux uropathy: Secondary | ICD-10-CM | POA: Diagnosis not present

## 2016-11-25 DIAGNOSIS — Z23 Encounter for immunization: Secondary | ICD-10-CM

## 2016-11-25 DIAGNOSIS — I70299 Other atherosclerosis of native arteries of extremities, unspecified extremity: Secondary | ICD-10-CM

## 2016-11-25 DIAGNOSIS — N401 Enlarged prostate with lower urinary tract symptoms: Secondary | ICD-10-CM

## 2016-11-25 DIAGNOSIS — D473 Essential (hemorrhagic) thrombocythemia: Secondary | ICD-10-CM

## 2016-11-25 DIAGNOSIS — D75839 Thrombocytosis, unspecified: Secondary | ICD-10-CM

## 2016-11-25 DIAGNOSIS — I708 Atherosclerosis of other arteries: Secondary | ICD-10-CM

## 2016-11-25 HISTORY — DX: Solitary pulmonary nodule: R91.1

## 2016-11-25 HISTORY — DX: Atherosclerotic heart disease of native coronary artery without angina pectoris: I25.10

## 2016-11-25 HISTORY — DX: Other obstructive and reflux uropathy: N40.1

## 2016-11-25 HISTORY — DX: Atherosclerosis of aorta: I70.0

## 2016-11-25 HISTORY — DX: Atherosclerosis of other arteries: I70.8

## 2016-11-25 HISTORY — DX: Other obstructive and reflux uropathy: N13.8

## 2016-11-25 LAB — COMPREHENSIVE METABOLIC PANEL
ALBUMIN: 4.5 g/dL (ref 3.5–5.0)
ALT: 17 U/L (ref 17–63)
ANION GAP: 9 (ref 5–15)
AST: 19 U/L (ref 15–41)
Alkaline Phosphatase: 84 U/L (ref 38–126)
BUN: 23 mg/dL — ABNORMAL HIGH (ref 6–20)
CO2: 28 mmol/L (ref 22–32)
Calcium: 9.6 mg/dL (ref 8.9–10.3)
Chloride: 100 mmol/L — ABNORMAL LOW (ref 101–111)
Creatinine, Ser: 0.88 mg/dL (ref 0.61–1.24)
GFR calc Af Amer: >60 mL/min (ref >60)
GFR calc non Af Amer: >60 mL/min (ref >60)
GLUCOSE: 148 mg/dL — AB (ref 65–99)
POTASSIUM: 4.3 mmol/L (ref 3.5–5.1)
SODIUM: 137 mmol/L (ref 135–145)
Total Bilirubin: 0.4 mg/dL (ref 0.3–1.2)
Total Protein: 7.3 g/dL (ref 6.5–8.1)

## 2016-11-25 LAB — CBC WITH DIFFERENTIAL/PLATELET
BASOS ABS: 0.1 10*3/uL (ref 0–0.1)
Basophils Relative: 1 %
EOS ABS: 0.1 10*3/uL (ref 0–0.7)
EOS PCT: 1 %
HCT: 32.8 % — ABNORMAL LOW (ref 40.0–52.0)
Hemoglobin: 11 g/dL — ABNORMAL LOW (ref 13.0–18.0)
Lymphocytes Relative: 31 %
Lymphs Abs: 1.8 10*3/uL (ref 1.0–3.6)
MCH: 32.1 pg (ref 26.0–34.0)
MCHC: 33.5 g/dL (ref 32.0–36.0)
MCV: 95.6 fL (ref 80.0–100.0)
Monocytes Absolute: 0.4 10*3/uL (ref 0.2–1.0)
Monocytes Relative: 7 %
Neutro Abs: 3.4 10*3/uL (ref 1.4–6.5)
Neutrophils Relative %: 60 %
PLATELETS: 542 10*3/uL — AB (ref 150–440)
RBC: 3.43 MIL/uL — AB (ref 4.40–5.90)
RDW: 14.9 % — AB (ref 11.5–14.5)
WBC: 5.6 10*3/uL (ref 3.8–10.6)

## 2016-11-25 LAB — RETICULOCYTES
RBC.: 3.44 MIL/uL — ABNORMAL LOW (ref 4.40–5.90)
RETIC COUNT ABSOLUTE: 31 10*3/uL (ref 19.0–183.0)
Retic Ct Pct: 0.9 % (ref 0.4–3.1)

## 2016-11-25 LAB — PATHOLOGIST SMEAR REVIEW

## 2016-11-25 LAB — VITAMIN B12: Vitamin B-12: 257 pg/mL (ref 180–914)

## 2016-11-25 LAB — SEDIMENTATION RATE: SED RATE: 10 mm/h (ref 0–20)

## 2016-11-25 LAB — FOLATE: Folate: 50.1 ng/mL (ref >5.9)

## 2016-11-25 MED ORDER — ATORVASTATIN CALCIUM 40 MG PO TABS: 40.0000 mg | ORAL_TABLET | Freq: Every day | ORAL | 1 refills | Status: DC

## 2016-11-25 NOTE — Assessment & Plan Note (Addendum)
rescan later in 12 months per protocol

## 2016-11-25 NOTE — Assessment & Plan Note (Signed)
Goal LDL less than 70 

## 2016-11-25 NOTE — Patient Instructions (Addendum)
We'll have you see the urologist and the cardiologist If you have not heard anything from my staff in a week about any orders/referrals/studies from today, please contact us here to follow-up [phone] We'll rescan your lungs in 12 months  Atherosclerosis Atherosclerosis is narrowing and hardening of the blood vessels (arteries). Arteries are tubes that carry blood that contains oxygen from the heart to all parts of the body. Arteries can become narrow or clogged with a buildup of fat, cholesterol, calcium, or other substances (plaque). Plaque decreases the amount of blood that can flow through the artery. Atherosclerosis can affect any artery in the body, including:  Heart arteries (coronary artery disease), which may cause heart attack.  Brain arteries, which may cause stroke.  Leg, arm, and pelvis arteries (peripheral artery disease), which may cause pain and numbness.  Kidney arteries, which may cause kidney (renal) failure.  Treatment may slow the disease and prevent further damage to the heart, brain, peripheral arteries, and kidneys. What are the causes? Atherosclerosis develops when plaque forms in an artery. This damages the inside wall of the artery. Over time, the plaque grows and hardens. It may break through the artery wall. This causes a blood clot to form over the break, which narrows the artery more. The clot may also break loose and travel to other arteries, causing more damage. What increases the risk? This condition is more likely to develop in people who:  Are middle age or older.  Have a family history of atherosclerosis.  Have high cholesterol.  Have high blood fats (triglycerides).  Have diabetes.  Are overweight.  Smoke tobacco.  Do not exercise enough.  Have a substance in the blood that indicates increased levels of inflammation in the body (C-reactive protein, or CRP).  Have sleep apnea.  Are stressed.  Drink too much alcohol.  What are  the signs or symptoms? This condition may not cause any symptoms. If you do have symptoms, they are caused by damage to an area of your body that is not getting enough blood. The following symptoms are possible:  Coronary artery disease may cause chest pain and shortness of breath.  Decreased blood supply to your brain may cause a stroke. Signs and symptoms of stroke may include sudden: ? Weakness on one side of the body. ? Confusion. ? Changes in vision. ? Inability to speak or understand speech. ? Loss of balance, coordination, or ability to walk. ? Severe headache. ? Loss of consciousness.  Peripheral artery disease may cause pain and numbness, often in the legs and hips.  Renal failure may cause fatigue, nausea, swelling, and itchy skin.  How is this diagnosed? This condition is diagnosed based on your medical history and a physical exam. During the exam, your health care provider will check your pulses and listen for a "whooshing" sound over your arteries (bruit). You may have tests, such as:  Blood tests to check your levels of cholesterol, triglycerides, and CRP.  Electrocardiogram (ECG) to check for heart damage.  Chest X-ray to see if your heart is enlarged, which is a sign of heart failure.  Stress test to see how your heart reacts to exercise.  Echocardiogram to get images of the inside of your heart.  Ankle-Brachial index to compare blood pressure in your arms to blood pressure in your ankles.  Ultrasound of your peripheral arteries to check blood flow.  CT scan to check for damage to your heart or brain.  X-rays of blood vessels after dye  has been injected (angiogram) to check blood flow.  How is this treated? Treatment starts with lifestyle changes, which may include:  Changing your diet.  Losing weight.  Reducing stress.  Exercising and being more physically active.  Not smoking.  You also may need medicine to:  Lower triglycerides and  cholesterol.  Lower and control blood pressure.  Prevent blood clots.  Lower inflammation in your body.  Lower and control your blood sugar.  Sometimes, surgery is needed to remove plaque, widen your artery, or create a new path for your blood (bypass). Surgical treatment may include:  Removing plaque from an artery (endarterectomy).  Opening a narrowed heart artery (angioplasty).  Heart or peripheral artery bypass graft surgery.  Follow these instructions at home: Lifestyle   Eat a heart-healthy diet. Talk to your health care provider or a diet specialist (dietitian) if you need help. A heart-healthy diet includes: ? Limiting unhealthy fats and increasing healthy fats. Some examples of healthy fats are olive oil and canola oil. ? Eating plant-based foods, such as fruits, vegetables, nuts, legumes, and whole grains.  Follow an exercise program as told by your health care provider.  Maintain a healthy weight. Lose weight if directed by your health care provider.  Rest when you are tired.  Learn to manage your stress.  Do not use any tobacco products, such as cigarettes, chewing tobacco, and e-cigarettes. If you need help quitting, ask your health care provider.  Limit alcohol intake to no more than 1 drink a day for nonpregnant women and 2 drinks a day for men. One drink equals 12 oz of beer, 5 oz of wine, or 1 oz of hard liquor.  Do not abuse drugs. General instructions  Take over-the-counter and prescription medicines only as told by your health care provider.  Manage other health conditions as told by your health care provider.  Keep all follow-up visits as told by your health care provider. This is important. Contact a health care provider if:  You have chest pain or discomfort. This includes squeezing chest pain that may feel like indigestion (angina).  You have shortness of breath.  You have an irregular heartbeat.  You have unexplained fatigue.  You have  unexplained pain or numbness in an arm, leg, or hip.  You have nausea, swelling of your hands or feet, and itchy skin. Get help right away if:  You have symptoms of a heart attack, such as: ? Chest pain. ? Shortness of breath. ? Pain in your neck, jaw, arms, back, or stomach. ? Cold sweat. ? Nausea. ? Light-headedness.  You have symptoms of a stroke, such as sudden: ? Weakness on one side of your body. ? Confusion. ? Changes in vision. ? Inability to speak or understand speech. ? Loss of balance, coordination, or ability to walk. ? Severe headache. ? Loss of consciousness. These symptoms may represent a serious problem that is an emergency. Do not wait to see if the symptoms will go away. Get medical help right away. Call your local emergency services (911 in the [place] Do not drive yourself to the hospital. This information is not intended to replace advice given to you by your health care provider. Make sure you discuss any questions you have with your health care provider. Document Released: 04/26/2003 Document Revised: 07/12/2015 Document Reviewed: 12/25/2014 Elsevier Interactive Patient Education  2018 [employer].  Coronary Artery Disease, Male Coronary artery disease (CAD) is a condition in which the arteries that lead to the heart (  coronary arteries) become narrow or blocked. The narrowing or blockage can lead to decreased blood flow to the heart. Prolonged reduced blood flow can cause a heart attack (myocardial infarction or MI). This condition may also be called coronary heart disease. Because CAD is the leading cause of death in men, it is important to understand what causes this condition and how it is treated. What are the causes? CAD is most often caused by atherosclerosis. This is the buildup of fat and cholesterol (plaque) on the inside of the arteries. Over time, the plaque may narrow or block the artery, reducing blood flow to the heart. Plaque can also become weak  and break off within a coronary artery and cause a sudden blockage. Other less common causes of CAD include:  An embolism or blood clot in a coronary artery.  A tearing of the artery (spontaneous coronary artery dissection).  An aneurysm.  Inflammation (vasculitis) in the artery wall.  What increases the risk? The following factors may make you more likely to develop this condition:  Age. Men over age 69 are at a greater risk of CAD.  Family history of CAD.  Gender. Men often develop CAD earlier in life than women.  High blood pressure (hypertension).  Diabetes.  High cholesterol levels.  Tobacco use.  Excessive alcohol use.  Lack of exercise.  A diet high in saturated and trans fats, such as fried food and processed meat.  Other possible risk factors include:  High stress levels.  Depression.  Obesity.  Sleep apnea.  What are the signs or symptoms? Many people do not have any symptoms during the early stages of CAD. As the condition progresses, symptoms may include:  Chest pain (angina). The pain can: ? Feel like a crushing or squeezing, or a tightness, pressure, fullness, or heaviness in the chest. ? Last more than a few minutes or can stop and recur. The pain tends to get worse with exercise or stress and to fade with rest.  Pain in the arms, neck, jaw, or back.  Unexplained heartburn or indigestion.  Shortness of breath.  Nausea or vomiting.  Sudden light-headedness.  Sudden cold sweats.  Fluttering or fast heartbeat (palpitations).  How is this diagnosed? This condition is diagnosed based on:  Your family and medical history.  A physical exam.  Tests, including: ? A test to check the electrical signals in your heart (electrocardiogram). ? Exercise stress test. This looks for signs of blockage when the heart is stressed with exercise, such as running on a treadmill. ? Pharmacologic stress test. This test looks for signs of blockage when  the heart is being stressed with a medicine. ? Blood tests. ? Coronary angiogram. This is a procedure to look at the coronary arteries to see if there is any blockage. During this test, a dye is injected into your arteries so they appear on an X-ray. ? A test that uses sound waves to take a picture of your heart (echocardiogram). ? Chest X-ray.  How is this treated? This condition may be treated by:  Healthy lifestyle changes to reduce risk factors.  Medicines such as: ? Antiplatelet medicines and blood-thinning medicines, such as aspirin. These help to prevent blood clots. ? Nitroglycerin. ? Blood pressure medicines. ? Cholesterol-lowering medicine.  Coronary angioplasty and stenting. During this procedure, a thin, flexible tube is inserted through a blood vessel and into a blocked artery. A balloon or similar device on the end of the tube is inflated to open up the  artery. In some cases, a small, mesh tube (stent) is inserted into the artery to keep it open.  Coronary artery bypass surgery. During this surgery, veins or arteries from other parts of the body are used to create a bypass around the blockage and allow blood to reach your heart.  Follow these instructions at home: Medicines  Take over-the-counter and prescription medicines only as told by your health care provider.  Do not take the following medicines unless your health care provider approves: ? NSAIDs, such as ibuprofen, naproxen, or celecoxib. ? Vitamin supplements that contain vitamin A, vitamin E, or both. Lifestyle  Follow an exercise program approved by your health care provider. Aim for 150 minutes of moderate exercise or 75 minutes of vigorous exercise each week.  Maintain a healthy weight or lose weight as approved by your health care provider.  Rest when you are tired.  Learn to manage stress or try to limit your stress. Ask your health care provider for suggestions if you need help.  Get screened for  depression and seek treatment, if needed.  Do not use any products that contain nicotine or tobacco, such as cigarettes and e-cigarettes. If you need help quitting, ask your health care provider.  Do not use illegal drugs. Eating and drinking  Follow a heart-healthy diet. A dietitian can help educate you about healthy food options and changes. In general, eat plenty of fruits and vegetables, lean meats, and whole grains.  Avoid foods high in: ? Sugar. ? Salt (sodium). ? Saturated fat, such as processed or fatty meat. ? Trans fat, such as fried foods.  Use healthy cooking methods such as roasting, grilling, broiling, baking, poaching, steaming, or stir-frying.  If you drink alcohol, and your health care provider approves, limit your alcohol intake to no more than 2 drinks per day. One drink equals 12 ounces of beer, 5 ounces of wine, or 1 ounces of hard liquor. General instructions  Manage any other health conditions, such as hypertension and diabetes. These conditions affect your heart.  Your health care provider may ask you to monitor your blood pressure. Ideally, your blood pressure should be below 130/80.  Keep all follow-up visits as told by your health care provider. This is important. Get help right away if:  You have pain in your chest, neck, arm, jaw, stomach, or back that: ? Lasts more than a few minutes. ? Is recurring. ? Is not relieved by taking medicine under your tongue (sublingualnitroglycerin).  You have too much (profuse) sweating without cause.  You have unexplained: ? Heartburn or indigestion. ? Shortness of breath or difficulty breathing. ? Fluttering or fast heartbeat (palpitations). ? Nausea or vomiting. ? Fatigue. ? Feelings of nervousness or anxiety. ? Weakness. ? Diarrhea.  You have sudden light-headedness or dizziness.  You faint.  You feel like hurting yourself or think about taking your own life. These symptoms may represent a serious  problem that is an emergency. Do not wait to see if the symptoms will go away. Get medical help right away. Call your local emergency services (911 in the [place] Do not drive yourself to the hospital. Summary  Coronary artery disease (CAD) is a process in which the arteries that lead to the heart (coronary arteries) become narrow or blocked. The narrowing or blockage can lead to a heart attack.  Many people do not have any symptoms during the early stages of CAD. This is called "silent CAD."  CAD can be treated with lifestyle changes, medicines,  surgery, or a combination of these treatments. This information is not intended to replace advice given to you by your health care provider. Make sure you discuss any questions you have with your health care provider. Document Released: 08/31/2013 Document Revised: 01/25/2016 Document Reviewed: 01/25/2016 Elsevier Interactive Patient Education  2017 [employer].

## 2016-11-25 NOTE — Assessment & Plan Note (Signed)
Discussed, showed arterial cross-sectional model

## 2016-11-25 NOTE — Assessment & Plan Note (Signed)
Refer to urologist 

## 2016-11-25 NOTE — Progress Notes (Signed)
BP 134/62   Pulse 98   Temp 98.3 F (36.8 C) (Oral)   Resp 14   Wt 113 lb 14.4 oz (51.7 kg)   SpO2 99%   BMI 17.32 kg/m    Subjective:    Patient ID: Garrett Spikes., male    DOB: 08-02-1945, 71 y.o.   MRN: [id number]  HPI: Garrett Yoder. is a 71 y.o. male  Chief Complaint  Patient presents with  . Follow-up    Review CT    HPI His neck is continuing to heal; not needing as much pain medicine; not constipated  Here to go over the CT scan results CT chest/abd/pelvis November 20, 2016 ----------------------------------------------------------------------------------------- CLINICAL DATA:  Initial evaluation for unintentional weight loss, hypoglycemic episodes, fatigue. History of smoking.  EXAM: CT CHEST, ABDOMEN, AND PELVIS WITH CONTRAST  TECHNIQUE: Multidetector CT imaging of the chest, abdomen and pelvis was performed following the standard protocol during bolus administration of intravenous contrast.  CONTRAST:  30mL ISOVUE-300 IOPAMIDOL (ISOVUE-300) INJECTION 61%  COMPARISON:  Prior CT from 08/31/2012.  FINDINGS: CT CHEST FINDINGS  Cardiovascular: Intrathoracic aorta of normal caliber and appearance without aneurysm or acute abnormality. Moderate aortic atherosclerosis. Partially visualized great vessels within normal limits. Heart size normal. Three vessel coronary artery calcifications. No pericardial effusion. Limited evaluation of the pulmonary arterial tree grossly unremarkable.  Mediastinum/Nodes: Thyroid within normal limits. No pathologically enlarged mediastinal, hilar, or axillary lymph nodes identified. Esophagus within normal limits.  Lungs/Pleura: Tracheobronchial tree is patent. Lungs are well inflated bilaterally. Minimal scattered atelectatic changes present dependently within the right lower lobe. No focal infiltrates. No pulmonary edema or pleural effusion. No pneumothorax. Single 5 mm nodule present at the medial  left lower lobe (series 4, image 101), indeterminate. No other worrisome pulmonary nodule or mass.  Musculoskeletal: External soft tissues within normal limits. No acute osseous abnormality. No worrisome lytic or blastic osseous lesions. Cervical ACDF partially visualized.  CT ABDOMEN PELVIS FINDINGS  Hepatobiliary: Liver demonstrates a normal contrast enhanced appearance. Gallbladder partially contracted and within normal limits. No biliary dilatation.  Pancreas: Pancreas within normal limits without mass lesion or acute inflammatory changes. No pancreatic ductal dilatation.  Spleen: Spleen within normal limits.  Adrenals/Urinary Tract: 15 mm right adrenal nodule not significantly changed from previous. Adrenal glands otherwise unremarkable. Kidneys equal in size with symmetric enhancement. No nephrolithiasis, hydronephrosis, or focal enhancing renal mass. No appreciable hydroureter. Bladder largely decompressed. Mild circumferential bladder wall thickening may be related incomplete distension and/ or chronic outlet obstruction.  Stomach/Bowel: Stomach moderately distended with enteric contrast material within the gastric lumen. Stomach otherwise unremarkable. No evidence for bowel obstruction. No abnormal wall thickening, mucosal enhancement, or inflammatory fat stranding seen about the bowels. No findings to suggest acute appendicitis.  Vascular/Lymphatic: Fairly extensive aorto bi-iliac atherosclerotic disease. No aneurysm. Normal intravascular enhancement seen throughout the intra- abdominal aorta and its branch vessels.  No pathologically enlarged intra- abdominal or pelvic lymph nodes identified.  Reproductive: Prostate is markedly enlarged measuring up to 5.1 cm in transverse diameter, invaginating upon the base of the bladder. Irregular cystic change present superiorly (series 2, image 107). This has progressed from previous. A superimposed prostatic or  even bladder mass would be difficult to exclude.  Other: No free air or fluid.  Musculoskeletal: Mild soft tissue thickening within the subcutaneous fat of the ventral abdomen, likely related to injection sites. No acute osseous abnormality. No worrisome lytic or blastic osseous lesions.  IMPRESSION: 1. Marked enlargement of the  prostate, invaginating upon the base of the bladder, increased in prominence relative to 2014. While these findings may simply be related to underlying benign prostatic hypertrophy, a superimposed prostatic or even bladder mass would be difficult to exclude. Correlation with PSA recommended. Additionally, urology consultation suggested as clinically warranted. 2. Mild diffuse circumferential bladder wall thickening, like related to incomplete distension and/or chronic outlet obstruction. 3. Stable indeterminate 15 mm right adrenal nodule, unchanged as compared to multiple previous studies, and likely benign. 4. Three vessel coronary artery calcifications with moderate aorto bi-iliac atherosclerotic disease. 5. 5 mm left lower lobe pulmonary nodule, indeterminate. No follow-up needed if patient is low-risk. Non-contrast chest CT can be considered in 12 months if patient is high-risk. This recommendation follows the consensus statement: Guidelines for Management of Incidental Pulmonary Nodules Detected on CT Images: From the Fleischner Society 2017; Radiology 2017; 284:228-243.   Electronically Signed   By: Jeannine Boga M.D.   On: 11/20/2016 18:00 ------------------------------------------------------------------------------------ Saw Dr. Janese Banks last Friday; having more testing done soon Type 2 diabetes; did not get glucagon because of $$$  Lab Results  Component Value Date   CHOL 155 07/24/2016   CHOL 155 12/14/2015   CHOL 159 08/14/2015   Lab Results  Component Value Date   HDL 62 07/24/2016   HDL 57 12/14/2015   HDL 63 08/14/2015    Lab Results  Component Value Date   LDLCALC 79 07/24/2016   LDLCALC 82 12/14/2015   LDLCALC 74 08/14/2015   Lab Results  Component Value Date   TRIG 72 07/24/2016   TRIG 78 12/14/2015   TRIG 109 08/14/2015   Lab Results  Component Value Date   CHOLHDL 2.7 12/14/2015   CHOLHDL 2.5 08/14/2015   No results found for: LDLDIRECT   Depression screen [facility] 2/9 11/25/2016 11/11/2016 08/21/2016 04/24/2016 12/14/2015  Decreased Interest 0 0 0 0 0  Down, Depressed, Hopeless 0 0 0 0 0  PHQ - 2 Score 0 0 0 0 0    Relevant past medical, surgical, family and social history reviewed Past Medical History:  Diagnosis Date  . Anemia 08/14/2015  . Anxiety   . Aortic atherosclerosis ([place]) 11/25/2016   Chest CT Oct 2018  . Aorto-iliac atherosclerosis ([place]) 11/25/2016   Abd/pelvic CT Oct 2018  . Coronary artery calcification seen on CAT scan 11/25/2016   Chest CT Oct 2018; refer to Dr. Fletcher Anon  . DDD (degenerative disc disease), cervical 08/21/2016  . Depression   . Diabetes mellitus without complication ([place])   . Facet arthropathy, cervical 08/21/2016  . GERD (gastroesophageal reflux disease)   . Hyperlipidemia   . Hypertension   . Hypertrophy of prostate with urinary obstruction 11/25/2016   Pelvic CT October 2018  . Incidental lung nodule, > 45mm and < 30mm 11/25/2016   5 mm nodule LLL, chest CT Oct 2018  . Thrombocytosis ([place]) 08/14/2015   Past Surgical History:  Procedure Laterality Date  . ANTERIOR CERVICAL DECOMP/DISCECTOMY FUSION N/A 09/24/2016   Procedure: ANTERIOR CERVICAL DECOMPRESSION/DISCECTOMY FUSION 3 LEVELS-C4-7;  Surgeon: Meade Maw, MD;  Location: [facility] ORS;  Service: Neurosurgery;  Laterality: N/A;  . CATARACT EXTRACTION    . EYE SURGERY Right    Cataract Extraction with IOL  . RETINAL LASER PROCEDURE     Family History  Problem Relation Age of Onset  . Stroke Mother   . Diabetes Mellitus II Father   . Diabetes Sister   . Cancer Maternal Grandfather   . Stroke Paternal  Grandmother  Social History   Social History  . Marital status: Married    Spouse name: N/A  . Number of children: N/A  . Years of education: N/A   Occupational History  . retired    Social History Main Topics  . Smoking status: Former Smoker    Packs/day: 1.00    Types: Cigarettes    Quit date: 07/19/1999  . Smokeless tobacco: Never Used  . Alcohol use No  . Drug use: No  . Sexual activity: Yes    Partners: Female   Other Topics Concern  . Not on file   Social History Narrative  . No narrative on file    Interim medical history since last visit reviewed. Allergies and medications reviewed  Review of Systems Per HPI unless specifically indicated above     Objective:    BP 134/62   Pulse 98   Temp 98.3 F (36.8 C) (Oral)   Resp 14   Wt 113 lb 14.4 oz (51.7 kg)   SpO2 99%   BMI 17.32 kg/m   Wt Readings from Last 3 Encounters:  11/25/16 113 lb 14.4 oz (51.7 kg)  11/21/16 113 lb 8 oz (51.5 kg)  11/11/16 113 lb 8 oz (51.5 kg)    Physical Exam  Constitutional: He appears well-developed and well-nourished. No distress.  HENT:  Head: Normocephalic and atraumatic.  Eyes: No scleral icterus.  Cardiovascular: Normal rate and regular rhythm.   Pulmonary/Chest: Effort normal and breath sounds normal.  Abdominal: Soft. He exhibits no distension.  Neurological: He is alert.  Skin: No pallor.  Psychiatric: He has a normal mood and affect.   Diabetic Foot Form - Detailed   Diabetic Foot Exam - detailed Diabetic Foot exam was performed with the following findings:  Yes 11/25/2016  2:35 PM  Visual Foot Exam completed.:  Yes  Pulse Foot Exam completed.:  Yes  Right Dorsalis Pedis:  Present Left Dorsalis Pedis:  Present  Sensory Foot Exam Completed.:  Yes Semmes-Weinstein Monofilament Test R Site 1-Great Toe:  Pos L Site 1-Great Toe:  Pos        Results for orders placed or performed in visit on 11/11/16  TSH  Result Value Ref Range   TSH 1.390 0.450 -  4.500 uIU/mL  T4, free  Result Value Ref Range   Free T4 1.15 0.82 - 1.77 ng/dL  Fe+TIBC+Fer  Result Value Ref Range   Total Iron Binding Capacity 300 250 - 450 ug/dL   UIBC 230 111 - 343 ug/dL   Iron 70 38 - 169 ug/dL   Iron Saturation 23 15 - 55 %   Ferritin 112 30 - 400 ng/mL  PSA  Result Value Ref Range   Prostate Specific Ag, Serum 2.6 0.0 - 4.0 ng/mL  CBC with Differential/Platelet  Result Value Ref Range   WBC 6.5 3.4 - 10.8 x10E3/uL   RBC 3.34 (L) 4.14 - 5.80 x10E6/uL   Hemoglobin 10.2 (L) 13.0 - 17.7 g/dL   Hematocrit 31.7 (L) 37.5 - 51.0 %   MCV 95 79 - 97 fL   MCH 30.5 26.6 - 33.0 pg   MCHC 32.2 31.5 - 35.7 g/dL   RDW 16.2 (H) 12.3 - 15.4 %   Platelets 748 (H) 150 - 379 x10E3/uL   Neutrophils 70 Not Estab. %   Lymphs 21 Not Estab. %   Monocytes 8 Not Estab. %   Eos 1 Not Estab. %   Basos 0 Not Estab. %   Neutrophils Absolute 4.5  1.4 - 7.0 x10E3/uL   Lymphocytes Absolute 1.4 0.7 - 3.1 x10E3/uL   Monocytes Absolute 0.5 0.1 - 0.9 x10E3/uL   EOS (ABSOLUTE) 0.1 0.0 - 0.4 x10E3/uL   Basophils Absolute 0.0 0.0 - 0.2 x10E3/uL   Immature Granulocytes 0 Not Estab. %   Immature Grans (Abs) 0.0 0.0 - 0.1 x10E3/uL      Assessment & Plan:   Problem List Items Addressed This Visit      Cardiovascular and Mediastinum   Coronary artery calcification seen on CAT scan    Discussed, refer to cardiologist      Relevant Medications   atorvastatin (LIPITOR) 40 MG tablet   Other Relevant Orders   Ambulatory referral to Cardiology   Aorto-iliac atherosclerosis ([place])    Goal LDL less than 70      Relevant Medications   atorvastatin (LIPITOR) 40 MG tablet   Aortic atherosclerosis ([facility])    Discussed, showed arterial cross-sectional model      Relevant Medications   atorvastatin (LIPITOR) 40 MG tablet   Other Relevant Orders   Ambulatory referral to Cardiology     Genitourinary   Hypertrophy of prostate with urinary obstruction    Refer to urologist      Relevant  Medications   tamsulosin (FLOMAX) 0.4 MG CAPS capsule   Other Relevant Orders   Ambulatory referral to Urology     Other   Incidental lung nodule, > 49mm and < 43mm    rescan later in 12 months per protocol      Abnormal computed tomography of bladder    Refer to urologist      Relevant Orders   Ambulatory referral to Urology       Follow up plan: Return in about 3 months (around 02/25/2017), or if symptoms worsen or fail to improve, for twenty minute follow-up with fasting labs.  An after-visit summary was printed and given to the patient at [facility].  Please see the patient instructions which may contain other information and recommendations beyond what is mentioned above in the assessment and plan.  Meds ordered this encounter  Medications  . gabapentin (NEURONTIN) 300 MG capsule    Sig: Take 300 mg by mouth 3 (three) times daily.    Refill:  1  . tamsulosin (FLOMAX) 0.4 MG CAPS capsule    Sig: Take 0.4 mg by mouth daily.  Marland Kitchen atorvastatin (LIPITOR) 40 MG tablet    Sig: Take 1 tablet (40 mg total) by mouth at bedtime. For cholesterol; this replaces simvastatin    Dispense:  90 tablet    Refill:  1    Orders Placed This Encounter  Procedures  . Ambulatory referral to Cardiology  . Ambulatory referral to Urology

## 2016-11-25 NOTE — Assessment & Plan Note (Signed)
Discussed, refer to cardiologist

## 2016-11-26 LAB — HAPTOGLOBIN: Haptoglobin: 212 mg/dL — ABNORMAL HIGH (ref 34–200)

## 2016-11-27 ENCOUNTER — Other Ambulatory Visit: Payer: Self-pay | Admitting: Family Medicine

## 2016-11-27 DIAGNOSIS — Z794 Long term (current) use of insulin: Principal | ICD-10-CM

## 2016-11-27 DIAGNOSIS — E1165 Type 2 diabetes mellitus with hyperglycemia: Secondary | ICD-10-CM | POA: Insufficient documentation

## 2016-11-27 DIAGNOSIS — E119 Type 2 diabetes mellitus without complications: Secondary | ICD-10-CM

## 2016-11-27 DIAGNOSIS — IMO0002 Reserved for concepts with insufficient information to code with codable children: Secondary | ICD-10-CM | POA: Insufficient documentation

## 2016-11-27 MED ORDER — ONETOUCH LANCETS MISC: 11 refills | Status: DC

## 2016-11-27 MED ORDER — GLUCOSE BLOOD VI STRP: ORAL_STRIP | 12 refills | Status: DC

## 2016-11-27 MED ORDER — ONETOUCH ULTRA 2 W/DEVICE KIT: PACK | 0 refills | Status: DC

## 2016-11-27 NOTE — Telephone Encounter (Signed)
Pt notified. Will pick it up tomorrow

## 2016-11-27 NOTE — Telephone Encounter (Signed)
OneTouch Ultra 2 device entered; Rxs ready to be picked up per his request

## 2016-11-27 NOTE — Telephone Encounter (Signed)
I was not able to find in our list for glucose meter One touch ulta 2. I was able to find the lancets and he has the strips already on his med list. Roselyn Reef was not able to find it as well.

## 2016-11-27 NOTE — Telephone Encounter (Signed)
Please enter all into med list; thank you

## 2016-11-27 NOTE — Telephone Encounter (Signed)
Pt states he needs a new blood sugar monitor, One Touch Ultra 2, strips,lancets,meter. Pt states it has to have directions say ICD 10 code exact quantity for insurance to pay. Pt states he would like to pick up RX and take it to the pharmacy if that is possible. He has a little over a weeks worth of test strips left.

## 2016-11-27 NOTE — Assessment & Plan Note (Signed)
Check sugars

## 2016-11-28 LAB — JAK2 GENOTYPR

## 2016-11-29 LAB — MULTIPLE MYELOMA PANEL, SERUM
ALBUMIN SERPL ELPH-MCNC: 4 g/dL (ref 2.9–4.4)
Albumin/Glob SerPl: 1.5 (ref 0.7–1.7)
Alpha 1: 0.2 g/dL (ref 0.0–0.4)
Alpha2 Glob SerPl Elph-Mcnc: 0.9 g/dL (ref 0.4–1.0)
B-Globulin SerPl Elph-Mcnc: 1.1 g/dL (ref 0.7–1.3)
Gamma Glob SerPl Elph-Mcnc: 0.6 g/dL (ref 0.4–1.8)
Globulin, Total: 2.7 g/dL (ref 2.2–3.9)
IGA: 186 mg/dL (ref 61–437)
IGM (IMMUNOGLOBULIN M), SRM: 42 mg/dL (ref 20–172)
IgG (Immunoglobin G), Serum: 592 mg/dL — ABNORMAL LOW (ref 700–1600)
TOTAL PROTEIN ELP: 6.7 g/dL (ref 6.0–8.5)

## 2016-12-01 ENCOUNTER — Telehealth: Payer: Self-pay

## 2016-12-01 LAB — CALRETICULIN (CALR) MUTATION ANALYSIS

## 2016-12-01 LAB — BCR-ABL1, CML/ALL, PCR, QUANT

## 2016-12-01 MED ORDER — "INSULIN SYRINGE-NEEDLE U-100 30G X 5/16"" 0.5 ML MISC": 3 refills | Status: DC

## 2016-12-01 NOTE — Telephone Encounter (Signed)
RX change request received from [place] stating that pt prefers 30G vs. 33G that was originally sent. Please advise.

## 2016-12-01 NOTE — Telephone Encounter (Signed)
New Rx sent.

## 2016-12-02 ENCOUNTER — Telehealth: Payer: Self-pay

## 2016-12-02 NOTE — Telephone Encounter (Signed)
Patient called concerning  Cardiologist referral and spoke with Dr. Sanda Klein and she said as long as he wasn't having any symptoms it was ok for him to wait till December for Cardiologist appointment.

## 2016-12-03 ENCOUNTER — Encounter: Payer: Medicare Other | Attending: Family Medicine | Admitting: Dietician

## 2016-12-03 ENCOUNTER — Encounter: Payer: Self-pay | Admitting: Dietician

## 2016-12-03 VITALS — Ht 68.0 in | Wt 112.2 lb

## 2016-12-03 DIAGNOSIS — R634 Abnormal weight loss: Secondary | ICD-10-CM

## 2016-12-03 DIAGNOSIS — E119 Type 2 diabetes mellitus without complications: Secondary | ICD-10-CM

## 2016-12-03 DIAGNOSIS — Z794 Long term (current) use of insulin: Principal | ICD-10-CM

## 2016-12-03 DIAGNOSIS — D473 Essential (hemorrhagic) thrombocythemia: Secondary | ICD-10-CM | POA: Diagnosis not present

## 2016-12-03 NOTE — Patient Instructions (Signed)
   Add a snack in the afternoon, such as fruit and nuts or milk and graham crackers, or lowfat cheese and crackers.   Continue to include a bedtime snack if your sugar is below 100 or if you are awake more than 3 hours after supper.   Add some extra protein or healthy fat into lunch and supper meals.  Include a protein food such as nuts with cereal or oatmeal at breakfast.

## 2016-12-03 NOTE — Progress Notes (Signed)
Medical Nutrition Therapy: Visit start time: 0900  end time: 1000  Assessment:  Diagnosis: Type 2 diabetes, unintentional weight loss Past medical history: hypoglycemic episodes, aortic atherosclerosis, hyperlipidemia, anemia, thrombocytosis Psychosocial issues/ stress concerns: none Preferred learning method:  . Auditory . Visual  Current weight: 112.2lbs  Height: 5'8" Medications, supplements: reconciled list in medical record  Progress and evaluation: Patient reports weight loss of over 20lbs after having flu about 4 years ago. Did not regain that weight, and lost another 20lbs after having surgery last year. He states he was unable to eat much at all for some time after surgery, but is now gradually able to eat more. He has regained some weight, but would feel best if he could gain to 140-150lbs. He states that he maintained his weight close to 150lbs for many years. After losing weight, he began to have some hypoglycemia episodes at night, once as low as 29; his wife called EMS. Recently he has been somewhat afraid to go to sleep in case he should have another episode. His Levemir dose has been decreased to 16 units daily and has not had any more episodes. He lives with his wife, states that they have not been cooking much at all lately.   Physical activity: none recently  Dietary Intake:  Usual eating pattern includes 3 meals and 0-1 snacks per day. Dining out frequency: 2-3 meals per week.  Breakfast: 9am cereal/ oatmeal; occasional frozen sausage, egg, cheese sandwich; toast with butter and jelly. 1 cup coffee with sugar free creamer Snack: none Lunch: 1-2pm sandwich on whole wheat sub roll (1/2) with chicken salad/ ham and cheese/ [place] with potato or macaroni salad Snack: usually none, occasionally strawberry applesauce, peanut butter crackers, or ice cream Supper: 5-6pm sandwich with tomato soup Snack: none unless BG <100 -- then peanut butter crackers Beverages: water, sugar  free soda, coffee. Drank regular soda when having hypoglycemic episodes  Nutrition Care Education: Topics covered: diabetes, weight gain, hyperlipidemia Basic nutrition: basic food groups, appropriate nutrient balance, appropriate meal and snack schedule, general nutrition guidelines    Weight gain: options for increasing caloric value of meals by adding healthy fats and protein; adding snack(s); importance of limiting sugar and unhealthy fats and controlling carb intake while increasing calories Diabetes: appropriate meal and snack schedule, appropriate carb intake and balance, importance of protein sources with all meals and some healthy fat to prevent hypoglycemia; options for quick and easy yet balanced meals. Reviewed appropriate treatment for hypoglycemia. Hyperlipidemia: healthy and unhealthy fats   Nutritional Diagnosis:  [place]-2.1 Inpaired nutrition utilization As related to diabetes.  As evidenced by patient report, MD notes. [place]-3.2 Unintentional weight loss As related to poor appetite after surgery.  As evidenced by BMI 17, patient report.  Intervention: Instruction as noted above.   Set goals with input from patient   Patient declined scheduling follow-up at this time, but will schedule later if needed.   Education Materials given:  . General diet guidelines for Diabetes . Plate Planner . Food lists/ Planning A Balanced Meal . Sample meal pattern/ menus . Goals/ instructions  Learner/ who was taught:  . Patient   Level of understanding: Marland Kitchen Verbalizes/ demonstrates competency  Demonstrated degree of understanding via:   Teach back Learning barriers: . None  Willingness to learn/ readiness for change: . Eager, change in progress  Monitoring and Evaluation:  Dietary intake, BG control, and body weight      follow up: prn

## 2016-12-05 ENCOUNTER — Ambulatory Visit: Payer: Medicare Other | Admitting: Oncology

## 2016-12-08 ENCOUNTER — Ambulatory Visit: Payer: Medicare Other | Admitting: Endocrinology

## 2016-12-08 ENCOUNTER — Telehealth: Payer: Self-pay

## 2016-12-08 ENCOUNTER — Telehealth: Payer: Self-pay | Admitting: Family Medicine

## 2016-12-08 DIAGNOSIS — E119 Type 2 diabetes mellitus without complications: Secondary | ICD-10-CM

## 2016-12-08 NOTE — Telephone Encounter (Signed)
ERRENOUS °

## 2016-12-08 NOTE — Telephone Encounter (Signed)
Patient stopped by today wants to see if you can place referral for him to [facility]? For DM, he states they are holding him an appt and he needs referral ASAP.  Please call once done [phone]

## 2016-12-08 NOTE — Telephone Encounter (Signed)
I put in a referral on 11/11/16; please have them use that one Thank you

## 2016-12-09 LAB — MPL MUTATION ANALYSIS

## 2016-12-11 ENCOUNTER — Ambulatory Visit: Payer: Medicare Other | Admitting: Urology

## 2016-12-12 ENCOUNTER — Other Ambulatory Visit: Payer: Self-pay | Admitting: Family Medicine

## 2016-12-12 ENCOUNTER — Inpatient Hospital Stay (HOSPITAL_BASED_OUTPATIENT_CLINIC_OR_DEPARTMENT_OTHER): Payer: Medicare Other | Admitting: Oncology

## 2016-12-12 ENCOUNTER — Encounter: Payer: Self-pay | Admitting: Oncology

## 2016-12-12 VITALS — BP 147/73 | HR 87 | Temp 97.7°F | Resp 14 | Wt 117.0 lb

## 2016-12-12 DIAGNOSIS — D473 Essential (hemorrhagic) thrombocythemia: Secondary | ICD-10-CM | POA: Diagnosis not present

## 2016-12-12 DIAGNOSIS — Z79899 Other long term (current) drug therapy: Secondary | ICD-10-CM

## 2016-12-12 DIAGNOSIS — D649 Anemia, unspecified: Secondary | ICD-10-CM

## 2016-12-12 DIAGNOSIS — D75839 Thrombocytosis, unspecified: Secondary | ICD-10-CM

## 2016-12-12 MED ORDER — "PEN NEEDLES 3/16"" 31G X 5 MM MISC": 3 refills | Status: DC

## 2016-12-12 MED ORDER — PEN NEEDLES 31G X 6 MM MISC: 3 refills | Status: DC

## 2016-12-12 NOTE — Progress Notes (Signed)
New Rx sent at request of pharmacy for 31Gx68mm or 32Gx5 mm Sent the 31G

## 2016-12-12 NOTE — Progress Notes (Signed)
New needle Rx sent

## 2016-12-12 NOTE — Progress Notes (Signed)
Hematology/Oncology Consult note [facility]  [phone] [phone]  Patient Care Team: [name], MD as PCP - General (Family Medicine) Vernon Prey, MD as Referring Physician (Ophthalmology) Meade Maw, MD as Consulting Physician (Neurosurgery)   Name of the patient: Garrett Yoder  [id number]  08/24/1945   Date of visit: 12/12/16  Diagnosis- thrombocytosis likely reactive  Chief complaint/ Reason for visit- discuss results of bloodwork  Heme/Onc history: patient is a 71 year old Caucasian male with a past medical history significant for diabetes, GERD, hypertension hyperlipidemia anxiety among other medical problems he has been referred to [place] for thrombocytosis. Of note patient has always had thrombocytosis even dating back to 2013 when his platelet count was 484. Since then his platelet count had been ranging between 400's to 500s. In July 2018 his platelet count was 522 and then gradually rose to 566 seconds 728 and then 748 recently. White count has always been normal. He is also had a long-standing normocytic anemia and his hemoglobin ranges around 11. Most recent CBC from 11/14/2016 showed white count of 6.5, H&H of 10.2/31.7 with an MCV of 95 and a platelet count of 748. Iron studies show normal ferritin of 112 and iron saturation of 23% and TIBC was normal at 300  Patient has had a 12 pound weight loss over the last 3 months but states that most of it happened over last 2 months after his disk surgery 8 weeks ago. He states that his appetite is coming back and he is feeling better. He has seen Dr. Ma Hillock from our practice in the past for iron deficiency anemia  Results of blood work from 11/25/2016 were as follows: CBC showed white count of 5.6, H&H of 11/32.8 and a platelet count of 542. B12 was normal at 257 and folate was normal. Haptoglobin levels were normal. Reticulocyte count was mildly low at 0.9%. Jak 2 And MPL  Were Negative. ESR Was Normal. Peripheral Smear Review Showed Normal Morphology of WBCs RBCs and Platelets. CMP Was within Normal Limits. BCR Able Testing Was Negative. Multiple Myeloma Panel Revealed No M Protein with Immunofixation Revealed Free Lambda Light Chains  Interval history- he will be seeing neurology soon for enlarged prostate and is also awaiting cardiology referral. Denies any pain. Energy levels are improved  ECOG PS- 0 Pain scale- 0   Review of systems- Review of Systems  Constitutional: Negative for chills, fever, malaise/fatigue and weight loss.  HENT: Negative for congestion, ear discharge and nosebleeds.   Eyes: Negative for blurred vision.  Respiratory: Negative for cough, hemoptysis, sputum production, shortness of breath and wheezing.   Cardiovascular: Negative for chest pain, palpitations, orthopnea and claudication.  Gastrointestinal: Negative for abdominal pain, blood in stool, constipation, diarrhea, heartburn, melena, nausea and vomiting.  Genitourinary: Negative for dysuria, flank pain, frequency, hematuria and urgency.  Musculoskeletal: Negative for back pain, joint pain and myalgias.  Skin: Negative for rash.  Neurological: Negative for dizziness, tingling, focal weakness, seizures, weakness and headaches.  Endo/Heme/Allergies: Does not bruise/bleed easily.  Psychiatric/Behavioral: Negative for depression and suicidal ideas. The patient does not have insomnia.       Allergies  Allergen Reactions  . Codeine Nausea Only    Other reaction(s): makes him sick to his stomach Other reaction(s): makes him sick to his stomach     Past Medical History:  Diagnosis Date  . Anemia 08/14/2015  . Anxiety   . Aortic atherosclerosis ([place]) 11/25/2016   Chest CT Oct 2018  .  Aorto-iliac atherosclerosis ([place]) 11/25/2016   Abd/pelvic CT Oct 2018  . Coronary artery calcification seen on CAT scan 11/25/2016   Chest CT Oct 2018; refer to Dr. Fletcher Anon  . DDD (degenerative  disc disease), cervical 08/21/2016  . Depression   . Diabetes mellitus without complication ([place])   . Facet arthropathy, cervical 08/21/2016  . GERD (gastroesophageal reflux disease)   . Hyperlipidemia   . Hypertension   . Hypertrophy of prostate with urinary obstruction 11/25/2016   Pelvic CT October 2018  . Incidental lung nodule, > 51m and < 871m10/10/2016   5 mm nodule LLL, chest CT Oct 2018  . [place]rombocytosis ([place]/27/2017     Past Surgical History:  Procedure Laterality Date  . ANTERIOR CERVICAL DECOMP/DISCECTOMY FUSION N/A 09/24/2016   Procedure: ANTERIOR CERVICAL DECOMPRESSION/DISCECTOMY FUSION 3 LEVELS-C4-7;  Surgeon: YaMeade MawMD;  Location: [facility] ORS;  Service: Neurosurgery;  Laterality: N/A;  . CATARACT EXTRACTION    . EYE SURGERY Right    Cataract Extraction with IOL  . RETINAL LASER PROCEDURE      Social History   Social History  . Marital status: Married    Spouse name: N/A  . Number of children: N/A  . Years of education: N/A   Occupational History  . retired    Social History Main Topics  . Smoking status: Former Smoker    Packs/day: 1.00    Types: Cigarettes    Quit date: 07/19/1999  . Smokeless tobacco: Never Used  . Alcohol use No  . Drug use: No  . Sexual activity: Yes    Partners: Female   Other Topics Concern  . Not on file   Social History Narrative  . No narrative on file    Family History  Problem Relation Age of Onset  . Stroke Mother   . Diabetes Mellitus II Father   . Diabetes Sister   . Cancer Maternal Grandfather   . Stroke Paternal Grandmother      Current Outpatient Prescriptions:  .  acetaminophen (TYLENOL) 500 MG tablet, Take 1,000 mg by mouth every 6 (six) hours as needed., Disp: , Rfl:  .  aspirin EC 81 MG tablet, Take 81 mg by mouth daily., Disp: , Rfl:  .  atorvastatin (LIPITOR) 40 MG tablet, Take 1 tablet (40 mg total) by mouth at bedtime. For cholesterol; this replaces simvastatin, Disp: 90 tablet, Rfl: 1 .   bimatoprost (LUMIGAN) 0.01 % SOLN, Place 1 drop into both eyes at bedtime. , Disp: , Rfl:  .  Blood Glucose Monitoring Suppl (ONE TOUCH ULTRA 2) w/Device KIT, OneTouch Ultra 2 device; Dx E11.9, insulin-dependent, LON 99 months; check fingerstick blood sugars four times a day, fluctuating, needs more frequent monitoring, Disp: 1 each, Rfl: 0 .  gabapentin (NEURONTIN) 300 MG capsule, Take 300 mg by mouth 3 (three) times daily., Disp: , Rfl: 1 .  glucagon (GLUCAGON EMERGENCY) 1 MG injection, Inject 1 mg into the vein once as needed., Disp: 1 each, Rfl: 2 .  glucose blood test strip, To match the OneTouch Ultra 2 device; Dx E11.9, insulin-dependent, LON 99 months; check fingerstick blood sugars four times a day, fluctuating, needs more frequent monitoring, Disp: 200 each, Rfl: 12 .  Insulin Detemir (LEVEMIR FLEXTOUCH) 100 UNIT/ML Pen, Inject 19 Units into the skin every morning. (Patient taking differently: Inject 16 Units into the skin every morning. ), Disp: 15 mL, Rfl: 5 .  Insulin Syringe-Needle U-100 (BD SAFETYGLIDE INSULIN SYRINGE) 30G X 5/16" 0.5 ML MISC, For  use with pen device to inject insulin once a day; LON 99 months, E11.9, Z79.4, Disp: 100 each, Rfl: 3 .  irbesartan (AVAPRO) 150 MG tablet, TAKE 1 TABLET BY MOUTH  DAILY, Disp: 90 tablet, Rfl: 1 .  metFORMIN (GLUCOPHAGE) 1000 MG tablet, TAKE 1 TABLET BY MOUTH TWO  TIMES DAILY WITH A MEAL, Disp: 180 tablet, Rfl: 1 .  Multiple Vitamin (MULTI-VITAMINS) TABS, Take 1 tablet by mouth 2 (two) times daily. , Disp: , Rfl:  .  omeprazole (PRILOSEC) 20 MG capsule, Take 1 capsule (20 mg total) by mouth daily., Disp: 30 capsule, Rfl: 3 .  ONE TOUCH LANCETS MISC, Check fingerstick blood sugars four times a day; LON 99 months; type 2 diabetes on insulin E11.9, fluctuating sugars, Disp: 200 each, Rfl: 11 .  Probiotic Product (PROBIOTIC DAILY) CAPS, Take 1 capsule by mouth every evening., Disp: , Rfl:  .  SIMBRINZA 1-0.2 % SUSP, Apply 1 drop to eye 3 (three)  times daily. , Disp: , Rfl:  .  tamsulosin (FLOMAX) 0.4 MG CAPS capsule, Take 0.4 mg by mouth daily., Disp: , Rfl:   Physical exam:  Vitals:   12/12/16 0930  BP: (!) 147/73  Pulse: 87  Resp: 14  Temp: 97.7 F (36.5 C)  TempSrc: Tympanic  Weight: 117 lb (53.1 kg)   Physical Exam  Constitutional: He is oriented to person, place, and time and well-developed, well-nourished, and in no distress.  HENT:  Head: Normocephalic and atraumatic.  Eyes: Pupils are equal, round, and reactive to light. EOM are normal.  Neck: Normal range of motion.  Cardiovascular: Normal rate, regular rhythm and normal heart sounds.   Pulmonary/Chest: Effort normal and breath sounds normal.  Abdominal: Soft. Bowel sounds are normal.  Neurological: He is alert and oriented to person, place, and time.  Skin: Skin is warm and dry.     CMP Latest Ref Rng & Units 11/25/2016  Glucose 65 - 99 mg/dL 148(H)  BUN 6 - 20 mg/dL 23(H)  Creatinine 0.61 - 1.24 mg/dL 0.88  Sodium 135 - 145 mmol/L 137  Potassium 3.5 - 5.1 mmol/L 4.3  Chloride 101 - 111 mmol/L 100(L)  CO2 22 - 32 mmol/L 28  Calcium 8.9 - 10.3 mg/dL 9.6  Total Protein 6.5 - 8.1 g/dL 7.3  Total Bilirubin 0.3 - 1.2 mg/dL 0.4  Alkaline Phos 38 - 126 U/L 84  AST 15 - 41 U/L 19  ALT 17 - 63 U/L 17   CBC Latest Ref Rng & Units 11/25/2016  WBC 3.8 - 10.6 K/uL 5.6  Hemoglobin 13.0 - 18.0 g/dL 11.0(L)  Hematocrit 40.0 - 52.0 % 32.8(L)  Platelets 150 - 440 K/uL 542(H)    No images are attached to the encounter.  Ct Chest W Contrast  Result Date: 11/20/2016 CLINICAL DATA:  Initial evaluation for unintentional weight loss, hypoglycemic episodes, fatigue. History of smoking. EXAM: CT CHEST, ABDOMEN, AND PELVIS WITH CONTRAST TECHNIQUE: Multidetector CT imaging of the chest, abdomen and pelvis was performed following the standard protocol during bolus administration of intravenous contrast. CONTRAST:  98m ISOVUE-300 IOPAMIDOL (ISOVUE-300) INJECTION 61%  COMPARISON:  Prior CT from 08/31/2012. FINDINGS: CT CHEST FINDINGS Cardiovascular: Intrathoracic aorta of normal caliber and appearance without aneurysm or acute abnormality. Moderate aortic atherosclerosis. Partially visualized great vessels within normal limits. Heart size normal. Three vessel coronary artery calcifications. No pericardial effusion. Limited evaluation of the pulmonary arterial tree grossly unremarkable. Mediastinum/Nodes: Thyroid within normal limits. No pathologically enlarged mediastinal, hilar, or axillary lymph nodes identified.  Esophagus within normal limits. Lungs/Pleura: Tracheobronchial tree is patent. Lungs are well inflated bilaterally. Minimal scattered atelectatic changes present dependently within the right lower lobe. No focal infiltrates. No pulmonary edema or pleural effusion. No pneumothorax. Single 5 mm nodule present at the medial left lower lobe (series 4, image 101), indeterminate. No other worrisome pulmonary nodule or mass. Musculoskeletal: External soft tissues within normal limits. No acute osseous abnormality. No worrisome lytic or blastic osseous lesions. Cervical ACDF partially visualized. CT ABDOMEN PELVIS FINDINGS Hepatobiliary: Liver demonstrates a normal contrast enhanced appearance. Gallbladder partially contracted and within normal limits. No biliary dilatation. Pancreas: Pancreas within normal limits without mass lesion or acute inflammatory changes. No pancreatic ductal dilatation. Spleen: Spleen within normal limits. Adrenals/Urinary Tract: 15 mm right adrenal nodule not significantly changed from previous. Adrenal glands otherwise unremarkable. Kidneys equal in size with symmetric enhancement. No nephrolithiasis, hydronephrosis, or focal enhancing renal mass. No appreciable hydroureter. Bladder largely decompressed. Mild circumferential bladder wall thickening may be related incomplete distension and/ or chronic outlet obstruction. Stomach/Bowel: Stomach  moderately distended with enteric contrast material within the gastric lumen. Stomach otherwise unremarkable. No evidence for bowel obstruction. No abnormal wall thickening, mucosal enhancement, or inflammatory fat stranding seen about the bowels. No findings to suggest acute appendicitis. Vascular/Lymphatic: Fairly extensive aorto bi-iliac atherosclerotic disease. No aneurysm. Normal intravascular enhancement seen throughout the intra- abdominal aorta and its branch vessels. No pathologically enlarged intra- abdominal or pelvic lymph nodes identified. Reproductive: Prostate is markedly enlarged measuring up to 5.1 cm in transverse diameter, invaginating upon the base of the bladder. Irregular cystic change present superiorly (series 2, image 107). This has progressed from previous. A superimposed prostatic or even bladder mass would be difficult to exclude. Other: No free air or fluid. Musculoskeletal: Mild soft tissue thickening within the subcutaneous fat of the ventral abdomen, likely related to injection sites. No acute osseous abnormality. No worrisome lytic or blastic osseous lesions. IMPRESSION: 1. Marked enlargement of the prostate, invaginating upon the base of the bladder, increased in prominence relative to 2014. While these findings may simply be related to underlying benign prostatic hypertrophy, a superimposed prostatic or even bladder mass would be difficult to exclude. Correlation with PSA recommended. Additionally, urology consultation suggested as clinically warranted. 2. Mild diffuse circumferential bladder wall thickening, like related to incomplete distension and/or chronic outlet obstruction. 3. Stable indeterminate 15 mm right adrenal nodule, unchanged as compared to multiple previous studies, and likely benign. 4. Three vessel coronary artery calcifications with moderate aorto bi-iliac atherosclerotic disease. 5. 5 mm left lower lobe pulmonary nodule, indeterminate. No follow-up needed if  patient is low-risk. Non-contrast chest CT can be considered in 12 months if patient is high-risk. This recommendation follows the consensus statement: Guidelines for Management of Incidental Pulmonary Nodules Detected on CT Images: From the Fleischner Society 2017; Radiology 2017; 284:228-243. Electronically Signed   By: Jeannine Boga M.D.   On: 11/20/2016 18:00   Ct Abdomen Pelvis W Contrast  Result Date: 11/20/2016 CLINICAL DATA:  Initial evaluation for unintentional weight loss, hypoglycemic episodes, fatigue. History of smoking. EXAM: CT CHEST, ABDOMEN, AND PELVIS WITH CONTRAST TECHNIQUE: Multidetector CT imaging of the chest, abdomen and pelvis was performed following the standard protocol during bolus administration of intravenous contrast. CONTRAST:  87m ISOVUE-300 IOPAMIDOL (ISOVUE-300) INJECTION 61% COMPARISON:  Prior CT from 08/31/2012. FINDINGS: CT CHEST FINDINGS Cardiovascular: Intrathoracic aorta of normal caliber and appearance without aneurysm or acute abnormality. Moderate aortic atherosclerosis. Partially visualized great vessels within normal limits. Heart size normal.  Three vessel coronary artery calcifications. No pericardial effusion. Limited evaluation of the pulmonary arterial tree grossly unremarkable. Mediastinum/Nodes: Thyroid within normal limits. No pathologically enlarged mediastinal, hilar, or axillary lymph nodes identified. Esophagus within normal limits. Lungs/Pleura: Tracheobronchial tree is patent. Lungs are well inflated bilaterally. Minimal scattered atelectatic changes present dependently within the right lower lobe. No focal infiltrates. No pulmonary edema or pleural effusion. No pneumothorax. Single 5 mm nodule present at the medial left lower lobe (series 4, image 101), indeterminate. No other worrisome pulmonary nodule or mass. Musculoskeletal: External soft tissues within normal limits. No acute osseous abnormality. No worrisome lytic or blastic osseous  lesions. Cervical ACDF partially visualized. CT ABDOMEN PELVIS FINDINGS Hepatobiliary: Liver demonstrates a normal contrast enhanced appearance. Gallbladder partially contracted and within normal limits. No biliary dilatation. Pancreas: Pancreas within normal limits without mass lesion or acute inflammatory changes. No pancreatic ductal dilatation. Spleen: Spleen within normal limits. Adrenals/Urinary Tract: 15 mm right adrenal nodule not significantly changed from previous. Adrenal glands otherwise unremarkable. Kidneys equal in size with symmetric enhancement. No nephrolithiasis, hydronephrosis, or focal enhancing renal mass. No appreciable hydroureter. Bladder largely decompressed. Mild circumferential bladder wall thickening may be related incomplete distension and/ or chronic outlet obstruction. Stomach/Bowel: Stomach moderately distended with enteric contrast material within the gastric lumen. Stomach otherwise unremarkable. No evidence for bowel obstruction. No abnormal wall thickening, mucosal enhancement, or inflammatory fat stranding seen about the bowels. No findings to suggest acute appendicitis. Vascular/Lymphatic: Fairly extensive aorto bi-iliac atherosclerotic disease. No aneurysm. Normal intravascular enhancement seen throughout the intra- abdominal aorta and its branch vessels. No pathologically enlarged intra- abdominal or pelvic lymph nodes identified. Reproductive: Prostate is markedly enlarged measuring up to 5.1 cm in transverse diameter, invaginating upon the base of the bladder. Irregular cystic change present superiorly (series 2, image 107). This has progressed from previous. A superimposed prostatic or even bladder mass would be difficult to exclude. Other: No free air or fluid. Musculoskeletal: Mild soft tissue thickening within the subcutaneous fat of the ventral abdomen, likely related to injection sites. No acute osseous abnormality. No worrisome lytic or blastic osseous lesions.  IMPRESSION: 1. Marked enlargement of the prostate, invaginating upon the base of the bladder, increased in prominence relative to 2014. While these findings may simply be related to underlying benign prostatic hypertrophy, a superimposed prostatic or even bladder mass would be difficult to exclude. Correlation with PSA recommended. Additionally, urology consultation suggested as clinically warranted. 2. Mild diffuse circumferential bladder wall thickening, like related to incomplete distension and/or chronic outlet obstruction. 3. Stable indeterminate 15 mm right adrenal nodule, unchanged as compared to multiple previous studies, and likely benign. 4. Three vessel coronary artery calcifications with moderate aorto bi-iliac atherosclerotic disease. 5. 5 mm left lower lobe pulmonary nodule, indeterminate. No follow-up needed if patient is low-risk. Non-contrast chest CT can be considered in 12 months if patient is high-risk. This recommendation follows the consensus statement: Guidelines for Management of Incidental Pulmonary Nodules Detected on CT Images: From the Fleischner Society 2017; Radiology 2017; 284:228-243. Electronically Signed   By: Jeannine Boga M.D.   On: 11/20/2016 18:00     Assessment and plan- Patient is a 71 y.o. male who sees me for following issues:  1. Thrombocytosis- platelet counts have improved over last 2 months. Also jak 2 calr MPL Testing Have Been Negative. BCR abl testing also negative. Suspicion for a primary myeloproliferative disorder is low at this time given the improvement in his platelet counts. I will therefore hold off on  bone marrow biopsy for now and repeat CBC in 6 weeks and see him back in 3 months with blood work  2. Normocytic anemia: discussed the results of his anemia workup which were essentially unremarkable other than free lambda light chains noted on serum immunofixation. I do not think that this is contributing to his anemia and this can be monitored  without any further intervention at this time. I will repeat his myeloma panel, serum free light chains and 24 hour free light chains in urine with next visit  RTC in 3 months    Visit Diagnosis 1. Thrombocytosis ([place])   2. Normocytic anemia      Dr. Randa Evens, MD, MPH [facility] Pager- [phone] 12/12/2016 9:59 AM

## 2016-12-12 NOTE — Progress Notes (Signed)
Patient here follow up with lab results. He states that he is feeling well today. He still has some pain in his neck and shoulders from a spinal fusion several months ago.

## 2016-12-19 ENCOUNTER — Ambulatory Visit: Payer: Medicare Other | Admitting: Family Medicine

## 2016-12-22 ENCOUNTER — Other Ambulatory Visit: Payer: Self-pay

## 2016-12-22 MED ORDER — OMEPRAZOLE 20 MG PO CPDR: 20.0000 mg | DELAYED_RELEASE_CAPSULE | Freq: Every day | ORAL | 0 refills | Status: DC

## 2016-12-23 DIAGNOSIS — H34811 Central retinal vein occlusion, right eye, with macular edema: Secondary | ICD-10-CM | POA: Diagnosis not present

## 2016-12-24 ENCOUNTER — Encounter: Payer: Self-pay | Admitting: Urology

## 2016-12-24 ENCOUNTER — Ambulatory Visit: Payer: Medicare Other | Admitting: Urology

## 2016-12-24 VITALS — BP 129/70 | HR 87 | Ht 68.0 in | Wt 121.8 lb

## 2016-12-24 DIAGNOSIS — N401 Enlarged prostate with lower urinary tract symptoms: Secondary | ICD-10-CM

## 2016-12-24 LAB — URINALYSIS, COMPLETE
BILIRUBIN UA: NEGATIVE
Ketones, UA: NEGATIVE
Leukocytes, UA: NEGATIVE
NITRITE UA: NEGATIVE
PH UA: 7 (ref 5.0–7.5)
RBC, UA: NEGATIVE
Specific Gravity, UA: 1.015 (ref 1.005–1.030)
UUROB: 0.2 mg/dL (ref 0.2–1.0)

## 2016-12-24 LAB — BLADDER SCAN AMB NON-IMAGING: SCAN RESULT: 72

## 2016-12-24 MED ORDER — FINASTERIDE 5 MG PO TABS: 5.0000 mg | ORAL_TABLET | Freq: Every day | ORAL | 3 refills | Status: DC

## 2016-12-24 NOTE — Progress Notes (Signed)
12/24/2016 2:57 PM   Garrett Yoder 11-14-1945 [id number]  Referring provider: [name], MD [address] [place], [place] [address]  Chief Complaint  Patient presents with  . New Patient (Initial Visit)    Enlarged prostate    HPI: Garrett Yoder is a 71 year old male who presents today in consultation at the request of Dr. Sanda Klein for evaluation of prostate enlargement.  He has a long history of BPH and saw Dr. Quillian Quince several years ago.  He has been on tamsulosin long-term with stable lower urinary tract symptoms.  He does note urinary frequency, urgency and nocturia x2 which has worsened in the past few years.  Denies dysuria or gross hematuria.  Denies flank, abdominal, pelvic or scrotal pain.  He had a CT of the chest/abdomen/pelvis performed last month for unintentional weight loss and fatigue.  He was incidentally noted to have marked prostate enlargement and the radiologist impression was most likely BPH but a "superimposed prostatic or bladder mass would be difficult to exclude".  A PSA was normal at 2.6.   PMH: Past Medical History:  Diagnosis Date  . Anemia 08/14/2015  . Anxiety   . Aortic atherosclerosis ([place]) 11/25/2016   Chest CT Oct 2018  . Aorto-iliac atherosclerosis ([facility]) 11/25/2016   Abd/pelvic CT Oct 2018  . Coronary artery calcification seen on CAT scan 11/25/2016   Chest CT Oct 2018; refer to Dr. Fletcher Anon  . DDD (degenerative disc disease), cervical 08/21/2016  . Depression   . Diabetes mellitus without complication ([facility])   . Facet arthropathy, cervical 08/21/2016  . GERD (gastroesophageal reflux disease)   . Hyperlipidemia   . Hypertension   . Hypertrophy of prostate with urinary obstruction 11/25/2016   Pelvic CT October 2018  . Incidental lung nodule, > 68m and < 879m10/10/2016   5 mm nodule LLL, chest CT Oct 2018  . Thrombocytosis ([facility]6/27/2017    Surgical History: Past Surgical History:  Procedure Laterality Date  . CATARACT EXTRACTION     . EYE SURGERY Right    Cataract Extraction with IOL  . RETINAL LASER PROCEDURE      Home Medications:  Allergies as of 12/24/2016      Reactions   Codeine Nausea Only   Other reaction(s): makes him sick to his stomach Other reaction(s): makes him sick to his stomach      Medication List        Accurate as of 12/24/16  2:57 PM. Always use your most recent med list.          acetaminophen 500 MG tablet Commonly known as:  TYLENOL Take 1,000 mg by mouth every 6 (six) hours as needed.   aspirin EC 81 MG tablet Take 81 mg by mouth daily.   atorvastatin 40 MG tablet Commonly known as:  LIPITOR Take 1 tablet (40 mg total) by mouth at bedtime. For cholesterol; this replaces simvastatin   bimatoprost 0.01 % Soln Commonly known as:  LUMIGAN Place 1 drop into both eyes at bedtime.   finasteride 5 MG tablet Commonly known as:  PROSCAR Take 1 tablet (5 mg total) daily by mouth.   gabapentin 300 MG capsule Commonly known as:  NEURONTIN Take 300 mg by mouth 3 (three) times daily.   glucose blood test strip To match the OneTouch Ultra 2 device; Dx E11.9, insulin-dependent, LON 99 months; check fingerstick blood sugars four times a day, fluctuating, needs more frequent monitoring   Insulin Detemir 100 UNIT/ML Pen Commonly  known as:  LEVEMIR FLEXTOUCH Inject 19 Units into the skin every morning.   irbesartan 150 MG tablet Commonly known as:  AVAPRO TAKE 1 TABLET BY MOUTH  DAILY   metFORMIN 1000 MG tablet Commonly known as:  GLUCOPHAGE TAKE 1 TABLET BY MOUTH TWO  TIMES DAILY WITH A MEAL   MULTI-VITAMINS Tabs Take 1 tablet by mouth 2 (two) times daily.   omeprazole 20 MG capsule Commonly known as:  PRILOSEC Take 1 capsule (20 mg total) daily by mouth. Caution:prolonged use may increase risk of pneumonia, colitis, osteoporosis, anemia   ONE TOUCH LANCETS Misc Check fingerstick blood sugars four times a day; LON 99 months; type 2 diabetes on insulin E11.9, fluctuating  sugars   ONE TOUCH ULTRA 2 w/Device Kit OneTouch Ultra 2 device; Dx E11.9, insulin-dependent, LON 99 months; check fingerstick blood sugars four times a day, fluctuating, needs more frequent monitoring   Pen Needles 31G X 6 MM Misc For use with pen device to inject insulin once a day; LON 99 months, E11.9, Z79.4   PROBIOTIC DAILY Caps Take 1 capsule by mouth every evening.   SIMBRINZA 1-0.2 % Susp Generic drug:  Brinzolamide-Brimonidine Apply 1 drop to eye 3 (three) times daily.   tamsulosin 0.4 MG Caps capsule Commonly known as:  FLOMAX Take 0.4 mg by mouth daily.       Allergies:  Allergies  Allergen Reactions  . Codeine Nausea Only    Other reaction(s): makes him sick to his stomach Other reaction(s): makes him sick to his stomach    Family History: Family History  Problem Relation Age of Onset  . Stroke Mother   . Diabetes Mellitus II Father   . Diabetes Sister   . Cancer Maternal Grandfather   . Stroke Paternal Grandmother   . Prostate cancer Neg Hx   . Bladder Cancer Neg Hx   . Kidney cancer Neg Hx     Social History:  reports that he quit smoking about 17 years ago. His smoking use included cigarettes. He smoked 1.00 pack per day. he has never used smokeless tobacco. He reports that he does not drink alcohol or use drugs.  ROS: UROLOGY Frequent Urination?: Yes Hard to postpone urination?: Yes Burning/pain with urination?: No Get up at night to urinate?: Yes Leakage of urine?: No Urine stream starts and stops?: No Trouble starting stream?: No Do you have to strain to urinate?: No Blood in urine?: No Urinary tract infection?: No Sexually transmitted disease?: No Injury to kidneys or bladder?: No Painful intercourse?: No Weak stream?: Yes Erection problems?: Yes Penile pain?: No  Gastrointestinal Nausea?: No Vomiting?: No Indigestion/heartburn?: No Diarrhea?: Yes Constipation?: No  Constitutional Fever: No Night sweats?: No Weight loss?:  Yes Fatigue?: Yes  Skin Skin rash/lesions?: No Itching?: No  Eyes Blurred vision?: No Double vision?: No  Ears/Nose/Throat Sore throat?: No Sinus problems?: No  Hematologic/Lymphatic Swollen glands?: No Easy bruising?: No  Cardiovascular Leg swelling?: No Chest pain?: No  Respiratory Cough?: No Shortness of breath?: No  Endocrine Excessive thirst?: No  Musculoskeletal Back pain?: No Joint pain?: No  Neurological Headaches?: No Dizziness?: No  Psychologic Depression?: No Anxiety?: No  Physical Exam: BP 129/70 (BP Location: Right Arm, Patient Position: Sitting, Cuff Size: Normal)   Pulse 87   Ht 5' 8"  (1.727 m)   Wt 121 lb 12.8 oz (55.2 kg)   BMI 18.52 kg/m   Constitutional:  Alert and oriented, No acute distress. HEENT:  AT, moist mucus membranes.  Trachea midline,  no masses. Cardiovascular: No clubbing, cyanosis, or edema. Respiratory: Normal respiratory effort, no increased work of breathing. GI: Abdomen is soft, nontender, nondistended, no abdominal masses GU: No CVA tenderness.  Prostate 60 g, smooth without nodules. Skin: No rashes, bruises or suspicious lesions. Lymph: No cervical or inguinal adenopathy. Neurologic: Grossly intact, no focal deficits, moving all 4 extremities. Psychiatric: Normal mood and affect.  Laboratory Data: Lab Results  Component Value Date   WBC 5.6 11/25/2016   HGB 11.0 (L) 11/25/2016   HCT 32.8 (L) 11/25/2016   MCV 95.6 11/25/2016   PLT 542 (H) 11/25/2016    Lab Results  Component Value Date   CREATININE 0.88 11/25/2016    Lab Results  Component Value Date   PSA1 2.6 11/14/2016    Lab Results  Component Value Date   HGBA1C 7.5 (H) 07/24/2016    Urinalysis Lab Results  Component Value Date   APPEARANCEUR CLEAR 10/30/2016   LEUKOCYTESUR NEGATIVE 10/30/2016   PROTEINUR 30 (A) 10/30/2016   GLUCOSEU 250 (A) 10/30/2016   RBCU  10/30/2016    TEST NOT REPORTED DUE TO COLOR INTERFERENCE OF URINE  PIGMENT   BILIRUBINUR NEGATIVE 10/30/2016   NITRITE NEGATIVE 10/30/2016    Lab Results  Component Value Date   LABMICR 61.2 12/14/2015   BACTERIA NONE SEEN 10/30/2016    Pertinent Imaging: CT was personally reviewed reviewed.  There is marked prostate enlargement with an intravesical median lobe.   Assessment & Plan:    1. Benign prostatic hyperplasia with lower urinary tract symptoms, symptom details unspecified CT findings consistent with median lobe prostate tissue which was also noted on a CT in 2014.  Urinalysis has shown no microhematuria and do not feel cystoscopy is warranted at this time to evaluate for a bladder mass.  He has noted increased frequency and urgency.  Based on prostate size have recommended starting finasteride however he was informed it will take 4-6 months before he may see efficacy.  Schedule follow-up in 6 months.  - Urinalysis, Complete - Bladder Scan (Post Void Residual) in office   Return in about 6 months (around 06/23/2017) for Recheck.  Abbie Sons, [facility] [address], [place],  [address] [phone]

## 2016-12-25 ENCOUNTER — Telehealth: Payer: Self-pay | Admitting: Family Medicine

## 2016-12-25 NOTE — Telephone Encounter (Signed)
Copied from Page [phone]. Topic: Quick Communication - See Telephone Encounter >> Dec 25, 2016  3:12 PM Vernona Rieger wrote: CRM for notification. See Telephone encounter for:   [facility] verbal order to have this sent over. Test strips ultra blue, one touch ultra blue & one touch lancets. Reference # [id number]. Call back # [phone]. They are requesting this please be done today if possible.  FYI- I had a very hard time understanding this gentleman 12/25/16.

## 2016-12-25 NOTE — Telephone Encounter (Signed)
Please see my prescriptions from [facility] with the pharmacy I have approved plenty of all of these

## 2016-12-26 NOTE — Telephone Encounter (Signed)
When I looked these up for some reason they where all printed? So I called in and gave verbal.

## 2017-01-02 DIAGNOSIS — Z794 Long term (current) use of insulin: Secondary | ICD-10-CM | POA: Diagnosis not present

## 2017-01-02 DIAGNOSIS — E1165 Type 2 diabetes mellitus with hyperglycemia: Secondary | ICD-10-CM | POA: Diagnosis not present

## 2017-01-06 DIAGNOSIS — H401132 Primary open-angle glaucoma, bilateral, moderate stage: Secondary | ICD-10-CM | POA: Diagnosis not present

## 2017-01-23 ENCOUNTER — Inpatient Hospital Stay: Payer: Medicare Other

## 2017-01-30 ENCOUNTER — Inpatient Hospital Stay: Payer: Medicare Other | Attending: Oncology

## 2017-01-30 DIAGNOSIS — D75839 Thrombocytosis, unspecified: Secondary | ICD-10-CM

## 2017-01-30 DIAGNOSIS — D649 Anemia, unspecified: Secondary | ICD-10-CM | POA: Insufficient documentation

## 2017-01-30 DIAGNOSIS — E785 Hyperlipidemia, unspecified: Secondary | ICD-10-CM | POA: Insufficient documentation

## 2017-01-30 DIAGNOSIS — N400 Enlarged prostate without lower urinary tract symptoms: Secondary | ICD-10-CM | POA: Diagnosis not present

## 2017-01-30 DIAGNOSIS — I10 Essential (primary) hypertension: Secondary | ICD-10-CM | POA: Diagnosis not present

## 2017-01-30 DIAGNOSIS — Z79899 Other long term (current) drug therapy: Secondary | ICD-10-CM | POA: Insufficient documentation

## 2017-01-30 DIAGNOSIS — M654 Radial styloid tenosynovitis [de Quervain]: Secondary | ICD-10-CM | POA: Diagnosis not present

## 2017-01-30 DIAGNOSIS — E119 Type 2 diabetes mellitus without complications: Secondary | ICD-10-CM | POA: Insufficient documentation

## 2017-01-30 DIAGNOSIS — D473 Essential (hemorrhagic) thrombocythemia: Secondary | ICD-10-CM | POA: Diagnosis not present

## 2017-01-30 DIAGNOSIS — Z87891 Personal history of nicotine dependence: Secondary | ICD-10-CM | POA: Insufficient documentation

## 2017-01-30 DIAGNOSIS — K219 Gastro-esophageal reflux disease without esophagitis: Secondary | ICD-10-CM | POA: Diagnosis not present

## 2017-01-30 DIAGNOSIS — Z7982 Long term (current) use of aspirin: Secondary | ICD-10-CM | POA: Diagnosis not present

## 2017-01-30 DIAGNOSIS — I700 Atherosclerosis of aorta: Secondary | ICD-10-CM | POA: Diagnosis not present

## 2017-01-30 DIAGNOSIS — F419 Anxiety disorder, unspecified: Secondary | ICD-10-CM | POA: Insufficient documentation

## 2017-01-30 DIAGNOSIS — F329 Major depressive disorder, single episode, unspecified: Secondary | ICD-10-CM | POA: Insufficient documentation

## 2017-01-30 LAB — CBC WITH DIFFERENTIAL/PLATELET
BASOS ABS: 0 10*3/uL (ref 0–0.1)
BASOS PCT: 1 %
EOS PCT: 1 %
Eosinophils Absolute: 0.1 10*3/uL (ref 0–0.7)
HCT: 32.9 % — ABNORMAL LOW (ref 40.0–52.0)
Hemoglobin: 10.9 g/dL — ABNORMAL LOW (ref 13.0–18.0)
LYMPHS PCT: 19 %
Lymphs Abs: 1.2 10*3/uL (ref 1.0–3.6)
MCH: 31.6 pg (ref 26.0–34.0)
MCHC: 33.2 g/dL (ref 32.0–36.0)
MCV: 95.3 fL (ref 80.0–100.0)
Monocytes Absolute: 0.4 10*3/uL (ref 0.2–1.0)
Monocytes Relative: 6 %
Neutro Abs: 4.6 10*3/uL (ref 1.4–6.5)
Neutrophils Relative %: 73 %
PLATELETS: 552 10*3/uL — AB (ref 150–440)
RBC: 3.46 MIL/uL — AB (ref 4.40–5.90)
RDW: 12.6 % (ref 11.5–14.5)
WBC: 6.3 10*3/uL (ref 3.8–10.6)

## 2017-02-02 ENCOUNTER — Ambulatory Visit: Payer: Medicare Other | Admitting: Cardiovascular Disease

## 2017-02-02 ENCOUNTER — Encounter: Payer: Self-pay | Admitting: Cardiovascular Disease

## 2017-02-02 VITALS — BP 108/48 | HR 75 | Ht 68.0 in | Wt 123.5 lb

## 2017-02-02 DIAGNOSIS — R0602 Shortness of breath: Secondary | ICD-10-CM

## 2017-02-02 DIAGNOSIS — E782 Mixed hyperlipidemia: Secondary | ICD-10-CM

## 2017-02-02 DIAGNOSIS — I10 Essential (primary) hypertension: Secondary | ICD-10-CM | POA: Diagnosis not present

## 2017-02-02 DIAGNOSIS — I2510 Atherosclerotic heart disease of native coronary artery without angina pectoris: Secondary | ICD-10-CM | POA: Diagnosis not present

## 2017-02-02 NOTE — Patient Instructions (Addendum)
Medication Instructions:  Your physician recommends that you continue on your current medications as directed. Please refer to the Current Medication list given to you today.   Labwork: none  Testing/Procedures: Your physician has requested that you have a lexiscan myoview. For further information please visit [email or web address]. Please follow instruction sheet, as given.  [place]  Your caregiver has ordered a Stress Test with nuclear imaging. The purpose of this test is to evaluate the blood supply to your heart muscle. This procedure is referred to as a "Non-Invasive Stress Test." This is because other than having an IV started in your vein, nothing is inserted or "invades" your body. Cardiac stress tests are done to find areas of poor blood flow to the heart by determining the extent of coronary artery disease (CAD). Some patients exercise on a treadmill, which naturally increases the blood flow to your heart, while others who are  unable to walk on a treadmill due to physical limitations have a pharmacologic/chemical stress agent called Lexiscan . This medicine will mimic walking on a treadmill by temporarily increasing your coronary blood flow.   Please note: these test may take anywhere between 2-4 hours to complete  PLEASE REPORT TO [facility] AT THE FIRST DESK WILL DIRECT YOU WHERE TO GO  Date of Procedure:___Friday, December 21____  Arrival Time for Procedure:______7:45am______  Instructions regarding medication:   _xx___ : Hold diabetes medication morning of procedure   PLEASE NOTIFY THE OFFICE AT LEAST 24 HOURS IN ADVANCE IF YOU ARE UNABLE TO KEEP YOUR APPOINTMENT.  [phone] AND  PLEASE NOTIFY NUCLEAR MEDICINE AT [facility] AT LEAST 24 HOURS IN ADVANCE IF YOU ARE UNABLE TO KEEP YOUR APPOINTMENT. [phone]  How to prepare for your Myoview test:  1. Do not eat or drink after midnight 2. No caffeine for 24 hours prior to test 3. No  smoking 24 hours prior to test. 4. Your medication may be taken with water.  If your doctor stopped a medication because of this test, do not take that medication. 5. Ladies, please do not wear dresses.  Skirts or pants are appropriate. Please wear a short sleeve shirt. 6. No perfume, cologne or lotion. 7. Wear comfortable walking shoes. No heels!            Follow-Up: Your physician recommends that you schedule a follow-up appointment as needed.   Any Other Special Instructions Will Be Listed Below (If Applicable).     If you need a refill on your cardiac medications before your next appointment, please call your pharmacy.  Cardiac Nuclear Scan A cardiac nuclear scan is a test that measures blood flow to the heart when a person is resting and when he or she is exercising. The test looks for problems such as:  Not enough blood reaching a portion of the heart.  The heart muscle not working normally.  You may need this test if:  You have heart disease.  You have had abnormal lab results.  You have had heart surgery or angioplasty.  You have chest pain.  You have shortness of breath.  In this test, a radioactive dye (tracer) is injected into your bloodstream. After the tracer has traveled to your heart, an imaging device is used to measure how much of the tracer is absorbed by or distributed to various areas of your heart. This procedure is usually done at a hospital and takes 2-4 hours. Tell a health care provider about:  Any allergies you have.  All medicines you are taking, including vitamins, herbs, eye drops, creams, and over-the-counter medicines.  Any problems you or family members have had with the use of anesthetic medicines.  Any blood disorders you have.  Any surgeries you have had.  Any medical conditions you have.  Whether you are pregnant or may be pregnant. What are the risks? Generally, this is a safe procedure. However, problems may occur,  including:  Serious chest pain and heart attack. This is only a risk if the stress portion of the test is done.  Rapid heartbeat.  Sensation of warmth in your chest. This usually passes quickly.  What happens before the procedure?  Ask your health care provider about changing or stopping your regular medicines. This is especially important if you are taking diabetes medicines or blood thinners.  Remove your jewelry on the day of the procedure. What happens during the procedure?  An IV tube will be inserted into one of your veins.  Your health care provider will inject a small amount of radioactive tracer through the tube.  You will wait for 20-40 minutes while the tracer travels through your bloodstream.  Your heart activity will be monitored with an electrocardiogram (ECG).  You will lie down on an exam table.  Images of your heart will be taken for about 15-20 minutes.  You may be asked to exercise on a treadmill or stationary bike. While you exercise, your heart's activity will be monitored with an ECG, and your blood pressure will be checked. If you are unable to exercise, you may be given a medicine to increase blood flow to parts of your heart.  When blood flow to your heart has peaked, a tracer will again be injected through the IV tube.  After 20-40 minutes, you will get back on the exam table and have more images taken of your heart.  When the procedure is over, your IV tube will be removed. The procedure may vary among health care providers and hospitals. Depending on the type of tracer used, scans may need to be repeated 3-4 hours later. What happens after the procedure?  Unless your health care provider tells you otherwise, you may return to your normal schedule, including diet, activities, and medicines.  Unless your health care provider tells you otherwise, you may increase your fluid intake. This will help flush the contrast dye from your body. Drink enough fluid  to keep your urine clear or pale yellow.  It is up to you to get your test results. Ask your health care provider, or the department that is doing the test, when your results will be ready. Summary  A cardiac nuclear scan measures the blood flow to the heart when a person is resting and when he or she is exercising.  You may need this test if you are at risk for heart disease.  Tell your health care provider if you are pregnant.  Unless your health care provider tells you otherwise, increase your fluid intake. This will help flush the contrast dye from your body. Drink enough fluid to keep your urine clear or pale yellow. This information is not intended to replace advice given to you by your health care provider. Make sure you discuss any questions you have with your health care provider. Document Released: 02/29/2004 Document Revised: 02/06/2016 Document Reviewed: 01/12/2013 Elsevier Interactive Patient Education  2017 [employer].

## 2017-02-02 NOTE — Progress Notes (Signed)
Cardiology Office Note   Date:  02/02/2017   ID:  Garrett Spikes., DOB Dec 18, 1945, MRN [id number]  PCP:  [name], MD  Cardiologist:   Kathlyn Sacramento, MD   Chief Complaint  Patient presents with  . other    Per PCP Calcification Aortic atherosclerosis c/o neck pain. Meds reviewed verbally with pt.      History of Present Illness: Garrett Sanker. is a 71 y.o. male who was referred by Dr. Sanda Klein for evaluation of aortic and coronary calcifications noted on recent CT scan.  The patient has no prior cardiac history except for a heart murmur when he was a child.  He has multiple chronic medical conditions that include diabetes mellitus diagnosed in 2001, hyperlipidemia, hypertension and previous tobacco use.  He quit smoking in 2001.  His father had coronary artery disease with multiple stents when he was in his 33s. The patient recently had weight loss of unclear reasons and thus he underwent CT scan of the chest and lungs.  No significant abnormalities were noted other than enlarged prostate.  However, he was noted to have three-vessel coronary artery calcifications as well as aortic calcifications.  The patient improved his diet and started gaining weight again.  He denies postprandial pain.  No chest pain or discomfort.  He has mild exertional dyspnea with no orthopnea, PND or leg edema.    Past Medical History:  Diagnosis Date  . Anemia 08/14/2015  . Anxiety   . Aortic atherosclerosis ([place]) 11/25/2016   Chest CT Oct 2018  . Aorto-iliac atherosclerosis ([place]) 11/25/2016   Abd/pelvic CT Oct 2018  . Coronary artery calcification seen on CAT scan 11/25/2016   Chest CT Oct 2018; refer to Dr. Fletcher Anon  . DDD (degenerative disc disease), cervical 08/21/2016  . Depression   . Diabetes mellitus without complication ([place])   . Facet arthropathy, cervical 08/21/2016  . GERD (gastroesophageal reflux disease)   . Heart murmur    As child  . Hyperlipidemia   . Hypertension   .  Hypertrophy of prostate with urinary obstruction 11/25/2016   Pelvic CT October 2018  . Incidental lung nodule, > 30m and < 81m10/10/2016   5 mm nodule LLL, chest CT Oct 2018  . [place]    Past Surgical History:  Procedure Laterality Date  . ANTERIOR CERVICAL DECOMP/DISCECTOMY FUSION N/A 09/24/2016   Procedure: ANTERIOR CERVICAL DECOMPRESSION/DISCECTOMY FUSION 3 LEVELS-C4-7;  Surgeon: YaMeade MawMD;  Location: [facility] ORS;  Service: Neurosurgery;  Laterality: N/A;  . CATARACT EXTRACTION    . EYE SURGERY Right    Cataract Extraction with IOL  . RETINAL LASER PROCEDURE       Current Outpatient Medications  Medication Sig Dispense Refill  . acetaminophen (TYLENOL) 500 MG tablet Take 1,000 mg by mouth every 6 (six) hours as needed.    . Marland Kitchenspirin EC 81 MG tablet Take 81 mg by mouth daily.    . Marland Kitchentorvastatin (LIPITOR) 40 MG tablet Take 1 tablet (40 mg total) by mouth at bedtime. For cholesterol; this replaces simvastatin 90 tablet 1  . bimatoprost (LUMIGAN) 0.01 % SOLN Place 1 drop into both eyes at bedtime.     . Blood Glucose Monitoring Suppl (ONE TOUCH ULTRA 2) w/Device KIT OneTouch Ultra 2 device; Dx E11.9, insulin-dependent, LON 99 months; check fingerstick blood sugars four times a day, fluctuating, needs more frequent monitoring 1 each 0  . finasteride (PROSCAR) 5 MG tablet Take 1 tablet (5 mg  total) daily by mouth. 90 tablet 3  . gabapentin (NEURONTIN) 300 MG capsule Take 300 mg by mouth 3 (three) times daily.  1  . glucose blood test strip To match the OneTouch Ultra 2 device; Dx E11.9, insulin-dependent, LON 99 months; check fingerstick blood sugars four times a day, fluctuating, needs more frequent monitoring 200 each 12  . Insulin Detemir (LEVEMIR FLEXTOUCH) 100 UNIT/ML Pen Inject 19 Units into the skin every morning. (Patient taking differently: Inject 31 Units into the skin every morning. ) 15 mL 5  . Insulin Pen Needle (PEN NEEDLES) 31G X 6 MM MISC For  use with pen device to inject insulin once a day; LON 99 months, E11.9, Z79.4 100 each 3  . irbesartan (AVAPRO) 150 MG tablet TAKE 1 TABLET BY MOUTH  DAILY 90 tablet 1  . metFORMIN (GLUCOPHAGE) 1000 MG tablet TAKE 1 TABLET BY MOUTH TWO  TIMES DAILY WITH A MEAL 180 tablet 1  . Multiple Vitamin (MULTI-VITAMINS) TABS Take 1 tablet by mouth 2 (two) times daily.     Marland Kitchen omeprazole (PRILOSEC) 20 MG capsule Take 1 capsule (20 mg total) daily by mouth. Caution:prolonged use may increase risk of pneumonia, colitis, osteoporosis, anemia 90 capsule 0  . ONE TOUCH LANCETS MISC Check fingerstick blood sugars four times a day; LON 99 months; type 2 diabetes on insulin E11.9, fluctuating sugars 200 each 11  . Probiotic Product (PROBIOTIC DAILY) CAPS Take 1 capsule by mouth every evening.    Marland Kitchen SIMBRINZA 1-0.2 % SUSP Apply 1 drop to eye 3 (three) times daily.     . tamsulosin (FLOMAX) 0.4 MG CAPS capsule Take 0.4 mg by mouth daily.     No current facility-administered medications for this visit.     Allergies:   Codeine    Social History:  The patient  reports that he quit smoking about 17 years ago. His smoking use included cigarettes. He has a 30.00 pack-year smoking history. he has never used smokeless tobacco. He reports that he does not drink alcohol or use drugs.   Family History:  The patient's family history includes Cancer in his maternal grandfather; Diabetes in his sister; Diabetes Mellitus II in his father; Heart Problems in his father; Heart disease in his father; Stroke in his mother and paternal grandmother.    ROS:  Please see the history of present illness.   Otherwise, review of systems are positive for none.   All other systems are reviewed and negative.    PHYSICAL EXAM: VS:  BP (!) 108/48 (BP Location: Right Arm, Patient Position: Sitting, Cuff Size: Normal)   Pulse 75   Ht _0  (1.727 m)   Wt 123 lb 8 oz (56 kg)   BMI 18.78 kg/m  , BMI Body mass index is 18.78 kg/m. GEN: Well  nourished, well developed, in no acute distress  HEENT: normal  Neck: no JVD, carotid bruits, or masses Cardiac: RRR; no murmurs, rubs, or gallops,no edema  Respiratory:  clear to auscultation bilaterally, normal work of breathing GI: soft, nontender, nondistended, + BS MS: no deformity or atrophy  Skin: warm and dry, no rash Neuro:  Strength and sensation are intact Psych: euthymic mood, full affect   EKG:  EKG is ordered today. The ekg ordered today demonstrates normal sinus rhythm with nonspecific ST changes most notable in the inferior leads.   Recent Labs: 11/14/2016: TSH 1.390 11/25/2016: ALT 17; BUN 23; Creatinine, Ser 0.88; Potassium 4.3; Sodium 137 01/30/2017: Hemoglobin 10.9; Platelets 552  Lipid Panel    Component Value Date/Time   CHOL 155 07/24/2016 0829   TRIG 72 07/24/2016 0829   HDL 62 07/24/2016 0829   CHOLHDL 2.7 12/14/2015 0917   CHOLHDL 2.5 08/14/2015 1532   VLDL 22 08/14/2015 1532   LDLCALC 79 07/24/2016 0829      Wt Readings from Last 3 Encounters:  02/02/17 123 lb 8 oz (56 kg)  12/24/16 121 lb 12.8 oz (55.2 kg)  12/12/16 117 lb (53.1 kg)      PAD Screen 02/02/2017  Previous PAD dx? No  Previous surgical procedure? No  Pain with walking? No  Feet/toe relief with dangling? No  Painful, non-healing ulcers? No  Extremities discolored? No      ASSESSMENT AND PLAN:  1.  Coronary calcifications: This was noted on CT scan and basically it indicates the presence of atherosclerosis.  Other than mild exertional dyspnea, the patient has no clear anginal symptoms.  He has prolonged history of diabetes and multiple risk factors for coronary artery disease.  Due to that, I recommend further evaluation with a treadmill nuclear stress test to exclude obstructive coronary artery disease. If stress test comes back unremarkable, I recommend continuing medical therapy with low-dose aspirin, statin and risk factor modifications.  2.  Essential hypertension:  Blood pressure is controlled.  3.  Hyperlipidemia: Currently on atorvastatin .  Most recent LDL was 79 while he was on simvastatin.  I suspect that his LDL should be less than 70 by now.    Disposition:   FU with me as needed.   Signed,  Kathlyn Sacramento, MD  02/02/2017 1:43 PM    Tensed

## 2017-02-05 DIAGNOSIS — M25511 Pain in right shoulder: Secondary | ICD-10-CM | POA: Diagnosis not present

## 2017-02-05 DIAGNOSIS — M4712 Other spondylosis with myelopathy, cervical region: Secondary | ICD-10-CM | POA: Diagnosis not present

## 2017-02-05 DIAGNOSIS — Z981 Arthrodesis status: Secondary | ICD-10-CM | POA: Diagnosis not present

## 2017-02-05 DIAGNOSIS — M542 Cervicalgia: Secondary | ICD-10-CM | POA: Diagnosis not present

## 2017-02-06 ENCOUNTER — Ambulatory Visit
Admission: RE | Admit: 2017-02-06 | Discharge: 2017-02-06 | Disposition: A | Discharge status: Home / Self Care | Payer: Medicare Other | Source: Ambulatory Visit | Attending: Cardiovascular Disease | Admitting: Cardiovascular Disease

## 2017-02-06 DIAGNOSIS — R0602 Shortness of breath: Secondary | ICD-10-CM | POA: Insufficient documentation

## 2017-02-06 LAB — NM MYOCAR MULTI W/SPECT W/WALL MOTION / EF
CHL CUP NUCLEAR SDS: 0
CHL CUP NUCLEAR SRS: 0
CHL CUP NUCLEAR SSS: 0
CHL CUP RESTING HR STRESS: 70 {beats}/min
CSEPEW: 4.2 METS
CSEPPHR: 134 {beats}/min
Exercise duration (min): 4 min
Exercise duration (sec): 31 s
LV dias vol: 58 mL (ref 62–150)
LVSYSVOL: 14 mL
MPHR: 150 {beats}/min
NUC STRESS TID: 1
Percent HR: 89 %

## 2017-02-06 MED ORDER — TECHNETIUM TC 99M TETROFOSMIN IV KIT
32.7800 | PACK | Freq: Once | INTRAVENOUS | Status: AC | PRN
  Administered 2017-02-06: 32.78 via INTRAVENOUS

## 2017-02-06 MED ORDER — TECHNETIUM TC 99M TETROFOSMIN IV KIT
10.0000 | PACK | Freq: Once | INTRAVENOUS | Status: AC | PRN
  Administered 2017-02-06: 13.17 via INTRAVENOUS

## 2017-02-07 ENCOUNTER — Other Ambulatory Visit: Payer: Self-pay | Admitting: Family Medicine

## 2017-02-24 ENCOUNTER — Ambulatory Visit (INDEPENDENT_AMBULATORY_CARE_PROVIDER_SITE_OTHER): Payer: Medicare Other | Admitting: Family Medicine

## 2017-02-24 ENCOUNTER — Encounter: Payer: Self-pay | Admitting: Family Medicine

## 2017-02-24 DIAGNOSIS — D75839 Thrombocytosis, unspecified: Secondary | ICD-10-CM

## 2017-02-24 DIAGNOSIS — N138 Other obstructive and reflux uropathy: Secondary | ICD-10-CM

## 2017-02-24 DIAGNOSIS — D649 Anemia, unspecified: Secondary | ICD-10-CM

## 2017-02-24 DIAGNOSIS — I700 Atherosclerosis of aorta: Secondary | ICD-10-CM | POA: Diagnosis not present

## 2017-02-24 DIAGNOSIS — R911 Solitary pulmonary nodule: Secondary | ICD-10-CM | POA: Diagnosis not present

## 2017-02-24 DIAGNOSIS — D473 Essential (hemorrhagic) thrombocythemia: Secondary | ICD-10-CM | POA: Diagnosis not present

## 2017-02-24 DIAGNOSIS — I70299 Other atherosclerosis of native arteries of extremities, unspecified extremity: Secondary | ICD-10-CM

## 2017-02-24 DIAGNOSIS — N401 Enlarged prostate with lower urinary tract symptoms: Secondary | ICD-10-CM | POA: Diagnosis not present

## 2017-02-24 DIAGNOSIS — I708 Atherosclerosis of other arteries: Secondary | ICD-10-CM

## 2017-02-24 DIAGNOSIS — Z794 Long term (current) use of insulin: Secondary | ICD-10-CM | POA: Diagnosis not present

## 2017-02-24 DIAGNOSIS — E119 Type 2 diabetes mellitus without complications: Secondary | ICD-10-CM | POA: Diagnosis not present

## 2017-02-24 DIAGNOSIS — E782 Mixed hyperlipidemia: Secondary | ICD-10-CM

## 2017-02-24 NOTE — Assessment & Plan Note (Signed)
Goal LDL less than 70; continue statin; limit saturated fats 

## 2017-02-24 NOTE — Assessment & Plan Note (Signed)
Next scan due October 2019

## 2017-02-24 NOTE — Patient Instructions (Signed)
Please give the diabetic nutritionist a call and I'm sure they will help you increase your calories and still maintain glucose control

## 2017-02-24 NOTE — Assessment & Plan Note (Signed)
Followed by heme/ onc 

## 2017-02-24 NOTE — Assessment & Plan Note (Signed)
Followed by heme-onc; goes back for labs later this month

## 2017-02-24 NOTE — Assessment & Plan Note (Addendum)
Foot exam by MD; already seen by endocrinologist

## 2017-02-24 NOTE — Assessment & Plan Note (Signed)
Seeing urologist; medicine is starting to help

## 2017-02-24 NOTE — Assessment & Plan Note (Signed)
Noted; try to keep LDL under 70

## 2017-02-24 NOTE — Progress Notes (Signed)
BP 116/62   Pulse 74   Temp 97.7 F (36.5 C) (Oral)   Resp 14   Wt 126 lb 9.6 oz (57.4 kg)   SpO2 99%   BMI 19.25 kg/m    Subjective:    Patient ID: Garrett Yoder., male    DOB: 02-14-1946, 72 y.o.   MRN: [id number]  HPI: Garrett Yoder. is a 72 y.o. male  Chief Complaint  Patient presents with  . Follow-up    HPI Patient is here for f/u He had the stress test and found out that was "great" He saw the urologist; walked away with the famous little blue pill; he thought surgery was in his future, but so far it's not; the blue pill is starting to help; takes about 4 months to work completely; he is about 2.5 months into treatment so far He goes back to see the oncologist on Jan 25th; they are doing blood tests He had to have iron infusions in the past; he prefers that to the iron shots and is expecting that to be to the case Energy level is beginning to improve Type 2 diabetes; no new problems with feet; thick great toenails; glucose readings have been all over the place Seeing endocrinologist, Dr. Gabriel Carina; [facility] [phone]; endo saw him and turned him loose; he had two hypoglycemic attacks back in September A1c 7.5 in June 2018; it was 6.2 on 01/02/17 Eye exam UTD Urine microalbumin:Cr was slightly elevated at 50.3 same day He wants a diabetic diet that will allow him to gain weight; he has been trying to maintain his weight He dropped down to 100 pounds after his surgery, only back up to 120 pounds and stuck there for a few months; he saw diabetic nutritionist a few months ago; he has meal plans High cholesterol Lab Results  Component Value Date   CHOL 155 07/24/2016   HDL 62 07/24/2016   LDLCALC 79 07/24/2016   TRIG 72 07/24/2016   CHOLHDL 2.7 12/14/2015     Depression screen [facility] 2/9 02/24/2017 12/03/2016 11/25/2016 11/11/2016 08/21/2016  Decreased Interest 0 0 0 0 0  Down, Depressed, Hopeless 0 0 0 0 0  PHQ - 2 Score 0 0 0 0 0    Relevant past medical, surgical,  family and social history reviewed Past Medical History:  Diagnosis Date  . Anemia 08/14/2015  . Anxiety   . Aortic atherosclerosis ([place]) 11/25/2016   Chest CT Oct 2018  . Aorto-iliac atherosclerosis ([place]) 11/25/2016   Abd/pelvic CT Oct 2018  . Coronary artery calcification seen on CAT scan 11/25/2016   Chest CT Oct 2018; refer to Dr. Fletcher Anon  . DDD (degenerative disc disease), cervical 08/21/2016  . Depression   . Diabetes mellitus without complication ([place])   . Facet arthropathy, cervical 08/21/2016  . GERD (gastroesophageal reflux disease)   . Heart murmur    As child  . Hyperlipidemia   . Hypertension   . Hypertrophy of prostate with urinary obstruction 11/25/2016   Pelvic CT October 2018  . Incidental lung nodule, > 8mm and < 57mm 11/25/2016   5 mm nodule LLL, chest CT Oct 2018  . Thrombocytosis ([place]) 08/14/2015   Past Surgical History:  Procedure Laterality Date  . ANTERIOR CERVICAL DECOMP/DISCECTOMY FUSION N/A 09/24/2016   Procedure: ANTERIOR CERVICAL DECOMPRESSION/DISCECTOMY FUSION 3 LEVELS-C4-7;  Surgeon: Meade Maw, MD;  Location: [facility] ORS;  Service: Neurosurgery;  Laterality: N/A;  . CATARACT EXTRACTION    . EYE SURGERY Right  Cataract Extraction with IOL  . RETINAL LASER PROCEDURE     Family History  Problem Relation Age of Onset  . Stroke Mother   . Diabetes Mellitus II Father   . Heart Problems Father   . Heart disease Father   . Diabetes Sister   . Cancer Maternal Grandfather   . Stroke Paternal Grandmother   . Prostate cancer Neg Hx   . Bladder Cancer Neg Hx   . Kidney cancer Neg Hx    Social History   Tobacco Use  . Smoking status: Former Smoker    Packs/day: 1.00    Years: 30.00    Pack years: 30.00    Types: Cigarettes    Last attempt to quit: 07/19/1999    Years since quitting: 17.6  . Smokeless tobacco: Never Used  Substance Use Topics  . Alcohol use: No    Alcohol/week: 0.0 oz  . Drug use: No    Interim medical history since last visit  reviewed. Allergies and medications reviewed  Review of Systems Per HPI unless specifically indicated above     Objective:    BP 116/62   Pulse 74   Temp 97.7 F (36.5 C) (Oral)   Resp 14   Wt 126 lb 9.6 oz (57.4 kg)   SpO2 99%   BMI 19.25 kg/m   Wt Readings from Last 3 Encounters:  02/24/17 126 lb 9.6 oz (57.4 kg)  02/02/17 123 lb 8 oz (56 kg)  12/24/16 121 lb 12.8 oz (55.2 kg)    Physical Exam  Constitutional: He appears well-developed and well-nourished. No distress.  Weight up almost 5 pounds over last 2 months  HENT:  Head: Normocephalic and atraumatic.  Eyes: No scleral icterus.  Cardiovascular: Normal rate and regular rhythm.  Pulmonary/Chest: Effort normal and breath sounds normal.  Abdominal: Soft. He exhibits no distension.  Musculoskeletal: He exhibits no edema.  Neurological: He is alert.  Skin: No pallor.  Psychiatric: He has a normal mood and affect.   Diabetic Foot Form - Detailed   Diabetic Foot Exam - detailed Diabetic Foot exam was performed with the following findings:  Yes 02/24/2017  1:42 PM  Visual Foot Exam completed.:  Yes  Pulse Foot Exam completed.:  Yes  Right Dorsalis Pedis:  Present Left Dorsalis Pedis:  Present  Sensory Foot Exam Completed.:  Yes Semmes-Weinstein Monofilament Test R Site 1-Great Toe:  Pos L Site 1-Great Toe:  Pos        Results for orders placed or performed during the hospital encounter of 02/06/17  NM Myocar Multi W/Spect W/Wall Motion / EF  Result Value Ref Range   Rest HR 70 bpm   Rest BP 126/61 mmHg   Exercise duration (sec) 31 sec   Percent HR 89 %   Exercise duration (min) 4 min   Estimated workload 4.2 METS   Peak HR 134 bpm   Peak BP 187/64 mmHg   MPHR 150 bpm   SSS 0    SRS 0    SDS 0    TID 1.00    LV sys vol 14 mL   LV dias vol 58 62 - 150 mL      Assessment & Plan:   Problem List Items Addressed This Visit      Cardiovascular and Mediastinum   Aorto-iliac atherosclerosis ([place])     Noted; try to keep LDL under 70        Endocrine   Diabetes mellitus type 2, insulin dependent ([place])  Foot exam by MD; already seen by endocrinologist      Relevant Medications   Insulin Detemir (LEVEMIR FLEXTOUCH) 100 UNIT/ML Pen     Genitourinary   Hypertrophy of prostate with urinary obstruction    Seeing urologist; medicine is starting to help        Hematopoietic and Hemostatic   Thrombocytosis ([facility])    Followed by heme-onc        Other   Incidental lung nodule, > 37mm and < 59mm    Next scan due October 2019      Hyperlipidemia    Goal LDL less than 70; continue statin; limit saturated fats      Anemia    Followed by heme-onc; goes back for labs later this month          Follow up plan: Return in about 6 weeks (around 04/06/2017) for labs only; return to see Dr. Sanda Klein in 3 months.  An after-visit summary was printed and given to the patient at [place].  Please see the patient instructions which may contain other information and recommendations beyond what is mentioned above in the assessment and plan.  No orders of the defined types were placed in this encounter.   No orders of the defined types were placed in this encounter.

## 2017-03-02 DIAGNOSIS — M25511 Pain in right shoulder: Secondary | ICD-10-CM | POA: Diagnosis not present

## 2017-03-02 DIAGNOSIS — M542 Cervicalgia: Secondary | ICD-10-CM | POA: Diagnosis not present

## 2017-03-02 DIAGNOSIS — M6281 Muscle weakness (generalized): Secondary | ICD-10-CM | POA: Diagnosis not present

## 2017-03-03 ENCOUNTER — Telehealth: Payer: Self-pay | Admitting: Family Medicine

## 2017-03-03 MED ORDER — RANITIDINE HCL 150 MG PO TABS: 150.0000 mg | ORAL_TABLET | Freq: Two times a day (BID) | ORAL | 3 refills | Status: DC

## 2017-03-03 NOTE — Telephone Encounter (Signed)
Please let pt know that the insurance company is concerned that he has been taking omeprazole this long Please verify that he does not have a recent stomach ulcer, GI bleed, or Barrett's esophagus If no, then let's stop his omeprazole and start ranitidine 150 mg BID He can use the omeprazole he has and taper off, one every other day for a week, then one every third day for a week and then stop He can start the ranitidine right away, even while taking the omeprazole (okay to overlap)

## 2017-03-04 NOTE — Telephone Encounter (Signed)
Called pt informed him of information below. Pt gave verbal understanding, states that he has about 2 months of ompemrazole left and he will finish what he has then he will being zantac 150mg  OTC as it is more cost effective that way.

## 2017-03-10 DIAGNOSIS — D473 Essential (hemorrhagic) thrombocythemia: Secondary | ICD-10-CM | POA: Diagnosis not present

## 2017-03-10 DIAGNOSIS — D649 Anemia, unspecified: Secondary | ICD-10-CM | POA: Diagnosis not present

## 2017-03-11 DIAGNOSIS — D649 Anemia, unspecified: Secondary | ICD-10-CM | POA: Diagnosis not present

## 2017-03-11 DIAGNOSIS — D473 Essential (hemorrhagic) thrombocythemia: Secondary | ICD-10-CM | POA: Diagnosis not present

## 2017-03-13 ENCOUNTER — Inpatient Hospital Stay (HOSPITAL_BASED_OUTPATIENT_CLINIC_OR_DEPARTMENT_OTHER): Payer: Medicare Other | Admitting: Oncology

## 2017-03-13 ENCOUNTER — Inpatient Hospital Stay: Payer: Medicare Other | Attending: Oncology

## 2017-03-13 ENCOUNTER — Encounter: Payer: Self-pay | Admitting: Oncology

## 2017-03-13 ENCOUNTER — Other Ambulatory Visit: Payer: Self-pay | Admitting: *Deleted

## 2017-03-13 VITALS — BP 138/73 | HR 80 | Temp 97.7°F | Resp 16 | Wt 129.0 lb

## 2017-03-13 DIAGNOSIS — D75839 Thrombocytosis, unspecified: Secondary | ICD-10-CM

## 2017-03-13 DIAGNOSIS — D649 Anemia, unspecified: Secondary | ICD-10-CM | POA: Diagnosis not present

## 2017-03-13 DIAGNOSIS — D473 Essential (hemorrhagic) thrombocythemia: Secondary | ICD-10-CM

## 2017-03-13 LAB — CBC WITH DIFFERENTIAL/PLATELET
Basophils Absolute: 0.1 10*3/uL (ref 0–0.1)
Basophils Relative: 1 %
EOS ABS: 0.2 10*3/uL (ref 0–0.7)
EOS PCT: 2 %
HEMATOCRIT: 32.8 % — AB (ref 40.0–52.0)
Hemoglobin: 10.9 g/dL — ABNORMAL LOW (ref 13.0–18.0)
Lymphocytes Relative: 21 %
Lymphs Abs: 1.6 10*3/uL (ref 1.0–3.6)
MCH: 31 pg (ref 26.0–34.0)
MCHC: 33.4 g/dL (ref 32.0–36.0)
MCV: 92.9 fL (ref 80.0–100.0)
MONO ABS: 0.4 10*3/uL (ref 0.2–1.0)
MONOS PCT: 6 %
NEUTROS ABS: 5.4 10*3/uL (ref 1.4–6.5)
Neutrophils Relative %: 70 %
PLATELETS: 549 10*3/uL — AB (ref 150–440)
RBC: 3.53 MIL/uL — ABNORMAL LOW (ref 4.40–5.90)
RDW: 12.6 % (ref 11.5–14.5)
WBC: 7.7 10*3/uL (ref 3.8–10.6)

## 2017-03-13 NOTE — Addendum Note (Signed)
Addended by: Luella Cook on: 03/13/2017 02:49 PM   Modules accepted: Orders

## 2017-03-13 NOTE — Progress Notes (Signed)
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 Of note patient has always had thrombocytosis even dating back to 2013 when his platelet count was 484. Since then his platelet count had been ranging between 400's to 500s. In July 2018 his platelet count was 522 and then gradually rose to 566 seconds 728 and then 748 recently. White count has always been normal. He is also had a long-standing normocytic anemia and his hemoglobin ranges around 11. Most recent CBC from 11/14/2016 showed white count of 6.5, H&H of 10.2/31.7 with an MCV of 95 and a platelet count of 748. Iron studies show normal ferritin of 112 and iron saturation of 23% and TIBC was normal at 300  Patient has had a 12 pound weight loss over the last 3 months but states that most of it happened over last 2 months after his disk surgery 8 weeks ago. He states that his appetite is coming back and he is feeling better. He has seen Dr. Ruthell Rummage our practice in the past for iron deficiency anemia  Results of blood work from 11/25/2016 were as follows: CBC showed white count of 5.6, H&H of 11/32.8 and a platelet count of 542. B12 was normal at 257 and folate was normal. Haptoglobin levels were normal. Reticulocyte count was mildly low at 0.9%. Jak 2 And  MPL Were Negative. ESR Was Normal. Peripheral Smear Review Showed Normal Morphology of WBCs RBCs and Platelets. CMP Was within Normal Limits. BCR Abl Testing Was Negative. Multiple Myeloma Panel Revealed No M Protein with Immunofixation Revealed Free Lambda Light Chains   Interval history- feels well. He is on medication for BPH and cardiac work up was completed as well and was unremarkable  ECOG PS- 0 Pain scale- 0   Review of systems- Review of Systems  Constitutional: Negative for chills, fever, malaise/fatigue and weight loss.  HENT: Negative for congestion, ear discharge and nosebleeds.   Eyes: Negative for blurred vision.  Respiratory: Negative for cough, hemoptysis, sputum production, shortness of breath and wheezing.   Cardiovascular: Negative for chest pain, palpitations, orthopnea and claudication.  Gastrointestinal: Negative for abdominal pain, blood in stool, constipation, diarrhea, heartburn, melena, nausea and vomiting.  Genitourinary: Negative for dysuria, flank pain, frequency, hematuria and urgency.  Musculoskeletal: Negative for back pain, joint pain and myalgias.  Skin: Negative for rash.  Neurological: Negative for dizziness, tingling, focal weakness, seizures, weakness and headaches.  Endo/Heme/Allergies: Does not bruise/bleed easily.  Psychiatric/Behavioral: Negative for depression and suicidal ideas. The patient does not have insomnia.        Allergies  Allergen Reactions  . Codeine Nausea Only    Other reaction(s): makes him sick to his stomach Other reaction(s): makes him sick to his stomach     Past Medical History:  Diagnosis Date  . Anemia 08/14/2015  . Anxiety   . Aortic atherosclerosis ([place]) 11/25/2016   Chest CT Oct 2018  .  Aorto-iliac atherosclerosis ([place]) 11/25/2016   Abd/pelvic CT Oct 2018  . Coronary artery calcification seen on CAT scan 11/25/2016   Chest CT Oct 2018; refer to Dr. Fletcher Anon  . DDD (degenerative disc disease), cervical 08/21/2016    . Depression   . Diabetes mellitus without complication ([place])   . Facet arthropathy, cervical 08/21/2016  . GERD (gastroesophageal reflux disease)   . Heart murmur    As child  . Hyperlipidemia   . Hypertension   . Hypertrophy of prostate with urinary obstruction 11/25/2016   Pelvic CT October 2018  . Incidental lung nodule, > 37m and < 824m10/10/2016   5 mm nodule LLL, chest CT Oct 2018  . Thrombocytosis ([place]/27/2017     Past Surgical History:  Procedure Laterality Date  . ANTERIOR CERVICAL DECOMP/DISCECTOMY FUSION N/A 09/24/2016   Procedure: ANTERIOR CERVICAL DECOMPRESSION/DISCECTOMY FUSION 3 LEVELS-C4-7;  Surgeon: YaMeade MawMD;  Location: [facility] ORS;  Service: Neurosurgery;  Laterality: N/A;  . CATARACT EXTRACTION    . EYE SURGERY Right    Cataract Extraction with IOL  . RETINAL LASER PROCEDURE      Social History   Socioeconomic History  . Marital status: Married    Spouse name: Not on file  . Number of children: Not on file  . Years of education: Not on file  . Highest education level: Not on file  Social Needs  . Financial resource strain: Not on file  . Food insecurity - worry: Not on file  . Food insecurity - inability: Not on file  . Transportation needs - medical: Not on file  . Transportation needs - non-medical: Not on file  Occupational History  . Occupation: retired  Tobacco Use  . Smoking status: Former Smoker    Packs/day: 1.00    Years: 30.00    Pack years: 30.00    Types: Cigarettes    Last attempt to quit: 07/19/1999    Years since quitting: 17.6  . Smokeless tobacco: Never Used  Substance and Sexual Activity  . Alcohol use: No    Alcohol/week: 0.0 oz  . Drug use: No  . Sexual activity: Yes    Partners: Female  Other Topics Concern  . Not on file  Social History Narrative  . Not on file    Family History  Problem Relation Age of Onset  . Stroke Mother   . Diabetes Mellitus II Father   . Heart Problems Father   . Heart disease  Father   . Diabetes Sister   . Cancer Maternal Grandfather   . Stroke Paternal Grandmother   . Prostate cancer Neg Hx   . Bladder Cancer Neg Hx   . Kidney cancer Neg Hx      Current Outpatient Medications:  .  acetaminophen (TYLENOL) 500 MG tablet, Take 1,000 mg by mouth 2 (two) times daily. , Disp: , Rfl:  .  aspirin EC 81 MG tablet, Take 81 mg by mouth daily., Disp: , Rfl:  .  atorvastatin (LIPITOR) 40 MG tablet, Take 1 tablet (40 mg total) by mouth at bedtime. For cholesterol; this replaces simvastatin, Disp: 90 tablet, Rfl: 1 .  bimatoprost (LUMIGAN) 0.01 % SOLN, Place 1 drop into both eyes at bedtime. , Disp: , Rfl:  .  Blood Glucose Monitoring Suppl (ONE TOUCH ULTRA 2) w/Device KIT, OneTouch Ultra 2 device; Dx E11.9, insulin-dependent, LON 99 months; check fingerstick blood sugars four times a day, fluctuating, needs more frequent monitoring, Disp: 1 each, Rfl: 0 .  finasteride (PROSCAR) 5 MG tablet, Take 1 tablet (5 mg total) daily by mouth., Disp: 90 tablet, Rfl: 3 .  gabapentin (NEURONTIN) 300 MG capsule, Take 300 mg by mouth 3 (three) times daily., Disp: , Rfl: 1 .  glucose blood test strip, To match the OneTouch Ultra 2 device; Dx E11.9, insulin-dependent, LON 99 months; check fingerstick blood sugars four times a day, fluctuating, needs more frequent monitoring, Disp: 200 each, Rfl: 12 .  Insulin Detemir (LEVEMIR FLEXTOUCH) 100 UNIT/ML Pen, Inject 34 Units into the skin every morning., Disp: , Rfl:  .  Insulin Pen Needle (PEN NEEDLES) 31G X 6 MM MISC, For use with pen device to inject insulin once a day; LON 99 months, E11.9, Z79.4, Disp: 100 each, Rfl: 3 .  irbesartan (AVAPRO) 150 MG tablet, TAKE 1 TABLET BY MOUTH  DAILY, Disp: 90 tablet, Rfl: 1 .  metFORMIN (GLUCOPHAGE) 1000 MG tablet, TAKE 1 TABLET BY MOUTH TWO  TIMES DAILY WITH A MEAL, Disp: 180 tablet, Rfl: 1 .  Multiple Vitamin (MULTI-VITAMINS) TABS, Take 1 tablet by mouth 2 (two) times daily. , Disp: , Rfl:  .  ONE TOUCH  LANCETS MISC, Check fingerstick blood sugars four times a day; LON 99 months; type 2 diabetes on insulin E11.9, fluctuating sugars, Disp: 200 each, Rfl: 11 .  Probiotic Product (PROBIOTIC DAILY) CAPS, Take 1 capsule by mouth every evening., Disp: , Rfl:  .  ranitidine (ZANTAC) 150 MG tablet, Take 1 tablet (150 mg total) by mouth 2 (two) times daily., Disp: 180 tablet, Rfl: 3 .  SIMBRINZA 1-0.2 % SUSP, Apply 1 drop to eye 3 (three) times daily. , Disp: , Rfl:  .  tamsulosin (FLOMAX) 0.4 MG CAPS capsule, Take 0.4 mg by mouth daily., Disp: , Rfl:   Physical exam:  Vitals:   03/13/17 1353 03/13/17 1355  BP:  138/73  Pulse:  80  Resp:  16  Temp:  97.7 F (36.5 C)  TempSrc:  Tympanic  Weight: 129 lb (58.5 kg) 129 lb (58.5 kg)   Physical Exam  Constitutional: He is oriented to person, place, and time and well-developed, well-nourished, and in no distress.  HENT:  Head: Normocephalic and atraumatic.  Eyes: EOM are normal. Pupils are equal, round, and reactive to light.  Neck: Normal range of motion.  Cardiovascular: Normal rate, regular rhythm and normal heart sounds.  Pulmonary/Chest: Effort normal and breath sounds normal.  Abdominal: Soft. Bowel sounds are normal.  Neurological: He is alert and oriented to person, place, and time.  Skin: Skin is warm and dry.     CMP Latest Ref Rng & Units 11/25/2016  Glucose 65 - 99 mg/dL 148(H)  BUN 6 - 20 mg/dL 23(H)  Creatinine 0.61 - 1.24 mg/dL 0.88  Sodium 135 - 145 mmol/L 137  Potassium 3.5 - 5.1 mmol/L 4.3  Chloride 101 - 111 mmol/L 100(L)  CO2 22 - 32 mmol/L 28  Calcium 8.9 - 10.3 mg/dL 9.6  Total Protein 6.5 - 8.1 g/dL 7.3  Total Bilirubin 0.3 - 1.2 mg/dL 0.4  Alkaline Phos 38 - 126 U/L 84  AST 15 - 41 U/L 19  ALT 17 - 63 U/L 17   CBC Latest Ref Rng & Units 01/30/2017  WBC 3.8 - 10.6 K/uL 6.3  Hemoglobin 13.0 - 18.0 g/dL 10.9(L)  Hematocrit 40.0 - 52.0 % 32.9(L)  Platelets 150 - 440 K/uL 552(H)      Assessment and plan-  Patient is a 72 y.o. male with following issues:  1. 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 Bcr abl testing negative. He has had waxing and waning thrombocytosis since 2014.  No clear explanation for his thrombocytosis as there is no evidence of any acute inflammation or iron deficiency anemia.  His thrombocytosis did improve from the 700s-500s but has not normalized yet.  I would therefore like to proceed with a bone marrow biopsy at this time to rule out an underlying myeloproliferative disorder  2. Normocytic anemia-patient had a normal hemoglobin up until August 2017 and then his hemoglobin drifted down from 13 to 10.peripheral blood workup for anemia was negative except for free light chains noted in his serum.  Myeloma and serum FLC pending from today.  24-hour urine studies for urine protein electrophoresis immunofixation and  free light chains have also been done today and are pending  I will see him back in 3 weeks time after his bone marrow biopsy to discuss results and further management   Visit Diagnosis 1. Normocytic anemia   2. Thrombocytosis ([place])      Dr. Randa Evens, MD, MPH [facility] at [facility] Pager- [phone] 03/13/2017 2:16 PM

## 2017-03-16 ENCOUNTER — Other Ambulatory Visit: Payer: Self-pay | Admitting: General Surgery

## 2017-03-16 ENCOUNTER — Telehealth: Payer: Self-pay | Admitting: *Deleted

## 2017-03-16 LAB — KAPPA/LAMBDA LIGHT CHAINS
KAPPA FREE LGHT CHN: 20.7 mg/L — AB (ref 3.3–19.4)
Kappa, lambda light chain ratio: 1.12 (ref 0.26–1.65)
Lambda free light chains: 18.5 mg/L (ref 5.7–26.3)

## 2017-03-16 LAB — IFE+PROTEIN ELECTRO, 24-HR UR
% BETA, Urine: 27.9 %
ALPHA 1 URINE: 6.3 %
Albumin, U: 17.6 %
Alpha 2, Urine: 21.7 %
GAMMA GLOBULIN URINE: 26.6 %
TOTAL VOLUME: 1500
Total Protein, Urine-Ur/day: 227 mg/24 hr — ABNORMAL HIGH (ref 30–150)
Total Protein, Urine: 15.1 mg/dL

## 2017-03-16 LAB — KAPPA/LAMBDA LIGHT CHAINS, FREE, WITH RATIO, 24HR. URINE
FR KAPPA LT CH,24HR: 200 mg/(24.h)
FR LAMBDA LT CH,24HR: 17 mg/24 hr
FREE KAPPA/LAMBDA RATIO: 11.67 — AB (ref 2.04–10.37)
Free Kappa Lt Chains,Ur: 133 mg/L — ABNORMAL HIGH (ref 1.35–24.19)
Free Lambda Lt Chains,Ur: 11.4 mg/L — ABNORMAL HIGH (ref 0.24–6.66)
TOTAL VOLUME: 1500

## 2017-03-16 NOTE — Telephone Encounter (Signed)
Called pt and let him know about bx date for tom. 1/29 and he was agreeable to the 9:30. [facility] at 8:30.  NPO after midnight tonight. Have a driver to take you back from procedure. Any meds with sip of water. Pt. Agreeable to all above and understands directions.

## 2017-03-17 ENCOUNTER — Ambulatory Visit
Admission: RE | Admit: 2017-03-17 | Discharge: 2017-03-17 | Disposition: A | Discharge status: Home / Self Care | Payer: Medicare Other | Source: Ambulatory Visit | Attending: Oncology | Admitting: Oncology

## 2017-03-17 ENCOUNTER — Other Ambulatory Visit (HOSPITAL_COMMUNITY)
Admission: RE | Admit: 2017-03-17 | Disposition: A | Discharge status: Home / Self Care | Payer: Medicare Other | Source: Ambulatory Visit | Attending: Oncology | Admitting: Oncology

## 2017-03-17 DIAGNOSIS — C9100 Acute lymphoblastic leukemia not having achieved remission: Secondary | ICD-10-CM | POA: Diagnosis not present

## 2017-03-17 DIAGNOSIS — D473 Essential (hemorrhagic) thrombocythemia: Secondary | ICD-10-CM | POA: Insufficient documentation

## 2017-03-17 DIAGNOSIS — N401 Enlarged prostate with lower urinary tract symptoms: Secondary | ICD-10-CM | POA: Insufficient documentation

## 2017-03-17 DIAGNOSIS — Z981 Arthrodesis status: Secondary | ICD-10-CM | POA: Insufficient documentation

## 2017-03-17 DIAGNOSIS — K219 Gastro-esophageal reflux disease without esophagitis: Secondary | ICD-10-CM | POA: Insufficient documentation

## 2017-03-17 DIAGNOSIS — F419 Anxiety disorder, unspecified: Secondary | ICD-10-CM | POA: Insufficient documentation

## 2017-03-17 DIAGNOSIS — I10 Essential (primary) hypertension: Secondary | ICD-10-CM | POA: Diagnosis not present

## 2017-03-17 DIAGNOSIS — Z87891 Personal history of nicotine dependence: Secondary | ICD-10-CM | POA: Insufficient documentation

## 2017-03-17 DIAGNOSIS — Z885 Allergy status to narcotic agent status: Secondary | ICD-10-CM | POA: Diagnosis not present

## 2017-03-17 DIAGNOSIS — D649 Anemia, unspecified: Secondary | ICD-10-CM | POA: Diagnosis not present

## 2017-03-17 DIAGNOSIS — E119 Type 2 diabetes mellitus without complications: Secondary | ICD-10-CM | POA: Insufficient documentation

## 2017-03-17 DIAGNOSIS — N138 Other obstructive and reflux uropathy: Secondary | ICD-10-CM | POA: Insufficient documentation

## 2017-03-17 DIAGNOSIS — I2584 Coronary atherosclerosis due to calcified coronary lesion: Secondary | ICD-10-CM | POA: Insufficient documentation

## 2017-03-17 DIAGNOSIS — E785 Hyperlipidemia, unspecified: Secondary | ICD-10-CM | POA: Diagnosis not present

## 2017-03-17 DIAGNOSIS — I2510 Atherosclerotic heart disease of native coronary artery without angina pectoris: Secondary | ICD-10-CM | POA: Insufficient documentation

## 2017-03-17 DIAGNOSIS — F329 Major depressive disorder, single episode, unspecified: Secondary | ICD-10-CM | POA: Diagnosis not present

## 2017-03-17 DIAGNOSIS — D75839 Thrombocytosis, unspecified: Secondary | ICD-10-CM

## 2017-03-17 DIAGNOSIS — M5030 Other cervical disc degeneration, unspecified cervical region: Secondary | ICD-10-CM | POA: Insufficient documentation

## 2017-03-17 LAB — CBC WITH DIFFERENTIAL/PLATELET
Basophils Absolute: 0.1 10*3/uL (ref 0–0.1)
Basophils Relative: 1 %
Eosinophils Absolute: 0.1 10*3/uL (ref 0–0.7)
Eosinophils Relative: 1 %
HEMATOCRIT: 35.5 % — AB (ref 40.0–52.0)
HEMOGLOBIN: 11.6 g/dL — AB (ref 13.0–18.0)
LYMPHS ABS: 1.3 10*3/uL (ref 1.0–3.6)
Lymphocytes Relative: 12 %
MCH: 30.4 pg (ref 26.0–34.0)
MCHC: 32.6 g/dL (ref 32.0–36.0)
MCV: 93.3 fL (ref 80.0–100.0)
MONOS PCT: 5 %
Monocytes Absolute: 0.6 10*3/uL (ref 0.2–1.0)
NEUTROS ABS: 8.8 10*3/uL — AB (ref 1.4–6.5)
NEUTROS PCT: 81 %
Platelets: 579 10*3/uL — ABNORMAL HIGH (ref 150–440)
RBC: 3.81 MIL/uL — ABNORMAL LOW (ref 4.40–5.90)
RDW: 12.7 % (ref 11.5–14.5)
WBC: 10.8 10*3/uL — ABNORMAL HIGH (ref 3.8–10.6)

## 2017-03-17 LAB — MULTIPLE MYELOMA PANEL, SERUM
ALBUMIN SERPL ELPH-MCNC: 4.1 g/dL (ref 2.9–4.4)
Albumin/Glob SerPl: 1.7 (ref 0.7–1.7)
Alpha 1: 0.1 g/dL (ref 0.0–0.4)
Alpha2 Glob SerPl Elph-Mcnc: 0.7 g/dL (ref 0.4–1.0)
B-GLOBULIN SERPL ELPH-MCNC: 1.1 g/dL (ref 0.7–1.3)
Gamma Glob SerPl Elph-Mcnc: 0.5 g/dL (ref 0.4–1.8)
Globulin, Total: 2.5 g/dL (ref 2.2–3.9)
IGA: 177 mg/dL (ref 61–437)
IgG (Immunoglobin G), Serum: 646 mg/dL — ABNORMAL LOW (ref 700–1600)
IgM (Immunoglobulin M), Srm: 44 mg/dL (ref 20–172)
TOTAL PROTEIN ELP: 6.6 g/dL (ref 6.0–8.5)

## 2017-03-17 LAB — PROTIME-INR
INR: 0.83
Prothrombin Time: 11.3 seconds — ABNORMAL LOW (ref 11.4–15.2)

## 2017-03-17 LAB — GLUCOSE, CAPILLARY: Glucose-Capillary: 247 mg/dL — ABNORMAL HIGH (ref 65–99)

## 2017-03-17 MED ORDER — SODIUM CHLORIDE 0.9 % IV SOLN
INTRAVENOUS | Status: DC
  Administered 2017-03-17: 09:00:00 via INTRAVENOUS

## 2017-03-17 MED ORDER — FENTANYL CITRATE (PF) 100 MCG/2ML IJ SOLN
INTRAMUSCULAR | Status: AC | PRN
  Administered 2017-03-17: 50 ug via INTRAVENOUS

## 2017-03-17 MED ORDER — FENTANYL CITRATE (PF) 100 MCG/2ML IJ SOLN
INTRAMUSCULAR | Status: AC
  Filled 2017-03-17: qty 2

## 2017-03-17 MED ORDER — BUPIVACAINE HCL (PF) 0.25 % IJ SOLN
INTRAMUSCULAR | Status: AC
  Filled 2017-03-17: qty 30

## 2017-03-17 MED ORDER — BUPIVACAINE HCL 0.25 % IJ SOLN
INTRAMUSCULAR | Status: AC | PRN
  Administered 2017-03-17: 14 mL

## 2017-03-17 MED ORDER — HEPARIN SOD (PORK) LOCK FLUSH 100 UNIT/ML IV SOLN
INTRAVENOUS | Status: AC
  Filled 2017-03-17: qty 5

## 2017-03-17 MED ORDER — MIDAZOLAM HCL 2 MG/2ML IJ SOLN
INTRAMUSCULAR | Status: AC | PRN
  Administered 2017-03-17: 1 mg via INTRAVENOUS

## 2017-03-17 MED ORDER — MIDAZOLAM HCL 2 MG/2ML IJ SOLN
INTRAMUSCULAR | Status: AC
  Filled 2017-03-17: qty 2

## 2017-03-17 NOTE — OR Nursing (Signed)
When setting up pt tray spilled hot coffee on right hip buttock. Quickly dried pt and changed sheet. Area where pt reports residual burning no redness noted. Right buttock cheek red but not able to determine if from hot coffee or from pt being on backside for extended period of time. Dr Golden Circle notified.

## 2017-03-17 NOTE — Procedures (Signed)
CT Bone marrow bx without difficulty  Complications:  None  Blood Loss: none  See dictation in canopy pacs  

## 2017-03-17 NOTE — Discharge Instructions (Signed)
Moderate Conscious Sedation, Adult, Care After These instructions provide you with information about caring for yourself after your procedure. Your health care provider may also give you more specific instructions. Your treatment has been planned according to current medical practices, but problems sometimes occur. Call your health care provider if you have any problems or questions after your procedure. What can I expect after the procedure? After your procedure, it is common:  To feel sleepy for several hours.  To feel clumsy and have poor balance for several hours.  To have poor judgment for several hours.  To vomit if you eat too soon.  Follow these instructions at home: For at least 24 hours after the procedure:   Do not: ? Participate in activities where you could fall or become injured. ? Drive. ? Use heavy machinery. ? Drink alcohol. ? Take sleeping pills or medicines that cause drowsiness. ? Make important decisions or sign legal documents. ? Take care of children on your own.  Rest. Eating and drinking  Follow the diet recommended by your health care provider.  If you vomit: ? Drink water, juice, or soup when you can drink without vomiting. ? Make sure you have little or no nausea before eating solid foods. General instructions  Have a responsible adult stay with you until you are awake and alert.  Take over-the-counter and prescription medicines only as told by your health care provider.  If you smoke, do not smoke without supervision.  Keep all follow-up visits as told by your health care provider. This is important. Contact a health care provider if:  You keep feeling nauseous or you keep vomiting.  You feel light-headed.  You develop a rash.  You have a fever. Get help right away if:  You have trouble breathing. This information is not intended to replace advice given to you by your health care provider. Make sure you discuss any questions you have  with your health care provider. Document Released: 11/24/2012 Document Revised: 07/09/2015 Document Reviewed: 05/26/2015 Elsevier Interactive Patient Education  2018 [place]. Bone Marrow Aspiration and Bone Marrow Biopsy, Adult, Care After This sheet gives you information about how to care for yourself after your procedure. Your health care provider may also give you more specific instructions. If you have problems or questions, contact your health care provider. What can I expect after the procedure? After the procedure, it is common to have:  Mild pain and tenderness.  Swelling.  Bruising.  Follow these instructions at home:  Take over-the-counter or prescription medicines only as told by your health care provider.  Do not take baths, swim, or use a hot tub until your health care provider approves. Ask if you can take a shower or have a sponge bath.  Follow instructions from your health care provider about how to take care of the puncture site. Make sure you: ? Wash your hands with soap and water before you change your bandage (dressing). If soap and water are not available, use hand sanitizer. ? Change your dressing as told by your health care provider.  Check your puncture siteevery day for signs of infection. Check for: ? More redness, swelling, or pain. ? More fluid or blood. ? Warmth. ? Pus or a bad smell.  Return to your normal activities as told by your health care provider. Ask your health care provider what activities are safe for you.  Do not drive for 24 hours if you were given a medicine to help you relax (sedative).  Keep all follow-up visits as told by your health care provider. This is important. °Contact a health care provider if: °· You have more redness, swelling, or pain around the puncture site. °· You have more fluid or blood coming from the puncture site. °· Your puncture site feels warm to the touch. °· You have pus or a bad smell coming from the  puncture site. °· You have a fever. °· Your pain is not controlled with medicine. °This information is not intended to replace advice given to you by your health care provider. Make sure you discuss any questions you have with your health care provider. °Document Released: 08/23/2004 Document Revised: 08/24/2015 Document Reviewed: 07/18/2015 °Elsevier Interactive Patient Education © 2018 Elsevier Inc. ° °

## 2017-03-17 NOTE — H&P (Signed)
Chief Complaint: anemia, thrombocytosis  Referring Physician:Dr. Randa Evens  Supervising Physician: Dr. Inez Catalina  Patient Status: [facility] - Out-pt  HPI: Garrett Yoder. is a 72 y.o. male with a history of HTN, DM, who also has anemia and thrombocytopenia.  This has been followed by Dr. Janese Banks since at least last year.  His platelet counts has been on the rise for over a year.  A request for a BMBX has been made.  He has no complaints and presents today for this procedure.  Past Medical History:  Past Medical History:  Diagnosis Date  . Anemia 08/14/2015  . Anxiety   . Aortic atherosclerosis ([place]) 11/25/2016   Chest CT Oct 2018  . Aorto-iliac atherosclerosis ([place]) 11/25/2016   Abd/pelvic CT Oct 2018  . Coronary artery calcification seen on CAT scan 11/25/2016   Chest CT Oct 2018; refer to Dr. Fletcher Anon  . DDD (degenerative disc disease), cervical 08/21/2016  . Depression   . Diabetes mellitus without complication ([place])   . Facet arthropathy, cervical 08/21/2016  . GERD (gastroesophageal reflux disease)   . Heart murmur    As child  . Hyperlipidemia   . Hypertension   . Hypertrophy of prostate with urinary obstruction 11/25/2016   Pelvic CT October 2018  . Incidental lung nodule, > 63mm and < 39mm 11/25/2016   5 mm nodule LLL, chest CT Oct 2018  . Thrombocytosis ([place]) 08/14/2015    Past Surgical History:  Past Surgical History:  Procedure Laterality Date  . ANTERIOR CERVICAL DECOMP/DISCECTOMY FUSION N/A 09/24/2016   Procedure: ANTERIOR CERVICAL DECOMPRESSION/DISCECTOMY FUSION 3 LEVELS-C4-7;  Surgeon: Meade Maw, MD;  Location: [facility] ORS;  Service: Neurosurgery;  Laterality: N/A;  . CATARACT EXTRACTION    . EYE SURGERY Right    Cataract Extraction with IOL  . RETINAL LASER PROCEDURE      Family History:  Family History  Problem Relation Age of Onset  . Stroke Mother   . Diabetes Mellitus II Father   . Heart Problems Father   . Heart disease Father   . Diabetes Sister     . Cancer Maternal Grandfather   . Stroke Paternal Grandmother   . Prostate cancer Neg Hx   . Bladder Cancer Neg Hx   . Kidney cancer Neg Hx     Social History:  reports that he quit smoking about 17 years ago. His smoking use included cigarettes. He has a 30.00 pack-year smoking history. he has never used smokeless tobacco. He reports that he does not drink alcohol or use drugs.  Allergies:  Allergies  Allergen Reactions  . Codeine Nausea Only    Other reaction(s): makes him sick to his stomach Other reaction(s): makes him sick to his stomach    Medications: Medications reviewed in epic  Please HPI for pertinent positives, otherwise complete 10 system ROS negative.  Mallampati Score: MD Evaluation Airway: WNL Heart: WNL Abdomen: WNL Chest/ Lungs: WNL ASA  Classification: 2 Mallampati/Airway Score: One  Physical Exam: General: pleasant, WD, WN white male who is laying in bed in NAD HEENT: head is normocephalic, atraumatic.  Sclera are noninjected.  PERRL.  Ears and nose without any masses or lesions.  Mouth is pink and moist Heart: regular, rate, and rhythm.  Normal s1,s2. +murmur. No obvious gallops, or rubs noted.  Palpable radial pulses bilaterally Lungs: CTAB, no wheezes, rhonchi, or rales noted.  Respiratory effort nonlabored Abd: soft, NT, ND, +BS, no masses, hernias, or organomegaly Psych: A&Ox3 with an appropriate  affect.   Labs: pending  Imaging: No results found.  Assessment/Plan 1. Anemia with thrombocytosis  Plan to proceed today with a BMBX.  Labs are pending.  Patient has no complaints today. Risks and benefits discussed with the patient including, but not limited to bleeding, infection, damage to adjacent structures or low yield requiring additional tests. All of the patient's questions were answered, patient is agreeable to proceed. Consent signed and in chart.   Thank you for this interesting consult.  I greatly enjoyed meeting Garrett Yoder. and look forward to participating in their care.  A copy of this report was sent to the requesting provider on this date.  Electronically Signed: Henreitta Cea 03/17/2017, 8:48 AM   I spent a total of  30 Minutes   in face to face in clinical consultation, greater than 50% of which was counseling/coordinating care for anemia

## 2017-03-17 NOTE — OR Nursing (Signed)
Right hip without redness or burn. Pt reports pain completely gone.

## 2017-03-23 DIAGNOSIS — H34811 Central retinal vein occlusion, right eye, with macular edema: Secondary | ICD-10-CM | POA: Diagnosis not present

## 2017-03-24 ENCOUNTER — Telehealth: Payer: Self-pay | Admitting: *Deleted

## 2017-03-24 NOTE — Telephone Encounter (Signed)
Tried calling patient again and got him and I spoke about b cell lymphocytes and we are still pending several results from bone marrow . He was concerned about the kappa lambda light chains and I told him that she looked at hsi labs and she was ruling out myeloma and it was neg.  Even though the test results are out of range sometimes your kidney spills over these light chains but it is not myeloma per dr Janese Banks progress notes and she she specifically said myeloma ruled out. He was just curious. He was thankful for call back

## 2017-03-24 NOTE — Telephone Encounter (Signed)
Called pt back to his home number which was requested. Pt has small populations of b cells. Still waiting on more labs. A lot of times it could be insignificant count that we will monitor over time but are still waiting on rest of labs.  There was no answer at his home and will try back later. No voicemail available

## 2017-03-24 NOTE — Telephone Encounter (Signed)
-----   Message from Wilburn Cornelia sent at 03/24/2017  9:19 AM EST ----- Regarding: results Contact: [phone] Test results on My[facility]-doesn't understand what it reads.

## 2017-03-28 ENCOUNTER — Other Ambulatory Visit: Payer: Self-pay | Admitting: Family Medicine

## 2017-03-30 NOTE — Telephone Encounter (Signed)
Last cmp reviewed; Rxs approved

## 2017-04-02 ENCOUNTER — Encounter (HOSPITAL_COMMUNITY): Payer: Self-pay

## 2017-04-02 ENCOUNTER — Inpatient Hospital Stay: Payer: Medicare Other | Attending: Oncology | Admitting: Oncology

## 2017-04-02 ENCOUNTER — Encounter: Payer: Self-pay | Admitting: Oncology

## 2017-04-02 VITALS — BP 118/63 | HR 84 | Temp 98.2°F | Ht 68.0 in | Wt 129.0 lb

## 2017-04-02 DIAGNOSIS — K219 Gastro-esophageal reflux disease without esophagitis: Secondary | ICD-10-CM | POA: Insufficient documentation

## 2017-04-02 DIAGNOSIS — E785 Hyperlipidemia, unspecified: Secondary | ICD-10-CM | POA: Diagnosis not present

## 2017-04-02 DIAGNOSIS — D75839 Thrombocytosis, unspecified: Secondary | ICD-10-CM

## 2017-04-02 DIAGNOSIS — I10 Essential (primary) hypertension: Secondary | ICD-10-CM

## 2017-04-02 DIAGNOSIS — E119 Type 2 diabetes mellitus without complications: Secondary | ICD-10-CM | POA: Diagnosis not present

## 2017-04-02 DIAGNOSIS — F419 Anxiety disorder, unspecified: Secondary | ICD-10-CM

## 2017-04-02 DIAGNOSIS — D473 Essential (hemorrhagic) thrombocythemia: Secondary | ICD-10-CM | POA: Diagnosis not present

## 2017-04-02 DIAGNOSIS — D649 Anemia, unspecified: Secondary | ICD-10-CM

## 2017-04-02 LAB — TISSUE HYBRIDIZATION (BONE MARROW)-NCBH

## 2017-04-02 LAB — CHROMOSOME ANALYSIS, BONE MARROW

## 2017-04-02 NOTE — Progress Notes (Signed)
No new changes noted today 

## 2017-04-03 NOTE — Progress Notes (Signed)
Hematology/Oncology Consult note [facility]  [phone] [phone]  Patient Care Team: [name], MD as PCP - General (Family Medicine) Vernon Prey, MD as Referring Physician (Ophthalmology) Meade Maw, MD as Consulting Physician (Neurosurgery)   Name of the patient: Garrett Yoder  [id number]  07/06/1945   Date of visit: 04/03/17  Diagnosis- 1. 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 Anemia likely due to chronic disease   Chief complaint/ Reason for visit- routine f/u of thrombocytosis  Heme/Onc history: patient is a 72 year old Caucasian male with a past medical history significant for diabetes, GERD, hypertension hyperlipidemia anxiety among other medical problems he has been referred to [place] for thrombocytosis. Of note patient has always had thrombocytosis even dating back to 2013 when his platelet count was 484. Since then his platelet count had been ranging between 400's to 500s. In July 2018 his platelet count was 522 and then gradually rose to 566 seconds 728 and then 748 recently. White count has always been normal. He is also had a long-standing normocytic anemia and his hemoglobin ranges around 11. Most recent CBC from 11/14/2016 showed white count of 6.5, H&H of 10.2/31.7 with an MCV of 95 and a platelet count of 748. Iron studies show normal ferritin of 112 and iron saturation of 23% and TIBC was normal at 300  Patient has had a 12 pound weight loss over the last 3 months but states that most of it happened over last 2 months after his disk surgery 8 weeks ago. He states that his appetite is coming back and he is feeling better. He has seen Dr. Ruthell Rummage our practice in the past for iron deficiency anemia  Results of blood work from 11/25/2016 were as follows: CBC showed white count of 5.6, H&H of 11/32.8 and a platelet count of 542. B12 was normal at 257 and folate was normal. Haptoglobin levels were normal.  Reticulocyte count was mildly low at 0.9%. Jak 2 And MPL Were Negative. ESR Was Normal. Peripheral Smear Review Showed Normal Morphology of WBCs RBCs and Platelets. CMP Was within Normal Limits. BCR Abl Testing Was Negative. Multiple Myeloma Panel Revealed No M Protein with Immunofixation Revealed Free Lambda Light Chains  Bone marrow biopsy on 03/17/2017 showed normocellular bone marrow with trilineage hematopoiesis.  Numerous lymphoid aggregates and normocytic normochromic anemia.  Bone marrow did not show any morphologic features of myeloproliferative or myelodysplastic neoplasm.  He did have numerous lymphoid aggregates.  Flow cytometry analysis showed a minor lymphoid population of B cells (22%) which were less than 5% of all cells and were coexpressing CD5 and CD20.  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  Cytogenetics and FISH studies were normal.  Plasma cells represented 2% of the cells and showed polyclonal staining pattern for kappa and lambda light chains and no evidence of plasma cell neoplasm in the bone marrow   Interval history- Patient is here to discuss the results of his bone marrow biopsy. He reports doing well. Denies any fatigue, lack of appetite or weight loss  ECOG PS- 1 Pain scale- 0   Review of systems- Review of Systems  Constitutional: Negative for chills, fever, malaise/fatigue and weight loss.  HENT: Negative for congestion, ear discharge and nosebleeds.   Eyes: Negative for blurred vision.  Respiratory: Negative for cough, hemoptysis, sputum production, shortness of breath and wheezing.   Cardiovascular: Negative for chest pain, palpitations, orthopnea and claudication.  Gastrointestinal: Negative for abdominal pain, blood in stool, constipation,  diarrhea, heartburn, melena, nausea and vomiting.  Genitourinary: Negative for dysuria, flank pain, frequency, hematuria and urgency.  Musculoskeletal: Negative for back pain, joint pain  and myalgias.  Skin: Negative for rash.  Neurological: Negative for dizziness, tingling, focal weakness, seizures, weakness and headaches.  Endo/Heme/Allergies: Does not bruise/bleed easily.  Psychiatric/Behavioral: Negative for depression and suicidal ideas. The patient does not have insomnia.       Allergies  Allergen Reactions  . Codeine Nausea Only    Other reaction(s): makes him sick to his stomach Other reaction(s): makes him sick to his stomach     Past Medical History:  Diagnosis Date  . Anemia 08/14/2015  . Anxiety   . Aortic atherosclerosis ([place]) 11/25/2016   Chest CT Oct 2018  . Aorto-iliac atherosclerosis ([place]) 11/25/2016   Abd/pelvic CT Oct 2018  . Coronary artery calcification seen on CAT scan 11/25/2016   Chest CT Oct 2018; refer to Dr. Fletcher Anon  . DDD (degenerative disc disease), cervical 08/21/2016  . Depression   . Diabetes mellitus without complication ([place])   . Facet arthropathy, cervical 08/21/2016  . GERD (gastroesophageal reflux disease)   . Heart murmur    As child  . Hyperlipidemia   . Hypertension   . Hypertrophy of prostate with urinary obstruction 11/25/2016   Pelvic CT October 2018  . Incidental lung nodule, > 76m and < 839m10/10/2016   5 mm nodule LLL, chest CT Oct 2018  . Thrombocytosis ([place]     Past Surgical History:  Procedure Laterality Date  . ANTERIOR CERVICAL DECOMP/DISCECTOMY FUSION N/A 09/24/2016   Procedure: ANTERIOR CERVICAL DECOMPRESSION/DISCECTOMY FUSION 3 LEVELS-C4-7;  Surgeon: YaMeade MawMD;  Location: [facility] ORS;  Service: Neurosurgery;  Laterality: N/A;  . CATARACT EXTRACTION    . EYE SURGERY Right    Cataract Extraction with IOL  . RETINAL LASER PROCEDURE      Social History   Socioeconomic History  . Marital status: Married    Spouse name: Not on file  . Number of children: Not on file  . Years of education: Not on file  . Highest education level: Not on file  Social Needs  . Financial resource strain:  Not on file  . Food insecurity - worry: Not on file  . Food insecurity - inability: Not on file  . Transportation needs - medical: Not on file  . Transportation needs - non-medical: Not on file  Occupational History  . Occupation: retired  Tobacco Use  . Smoking status: Former Smoker    Packs/day: 1.00    Years: 30.00    Pack years: 30.00    Types: Cigarettes    Last attempt to quit: 07/19/1999    Years since quitting: 17.7  . Smokeless tobacco: Never Used  Substance and Sexual Activity  . Alcohol use: No    Alcohol/week: 0.0 oz  . Drug use: No  . Sexual activity: Yes    Partners: Female  Other Topics Concern  . Not on file  Social History Narrative  . Not on file    Family History  Problem Relation Age of Onset  . Stroke Mother   . Diabetes Mellitus II Father   . Heart Problems Father   . Heart disease Father   . Diabetes Sister   . Cancer Maternal Grandfather   . Stroke Paternal Grandmother   . Prostate cancer Neg Hx   . Bladder Cancer Neg Hx   . Kidney cancer Neg Hx      Current Outpatient  Medications:  .  acetaminophen (TYLENOL) 500 MG tablet, Take 1,000 mg by mouth 2 (two) times daily. , Disp: , Rfl:  .  aspirin EC 81 MG tablet, Take 81 mg by mouth daily., Disp: , Rfl:  .  atorvastatin (LIPITOR) 40 MG tablet, TAKE 1 TABLET BY MOUTH AT  BEDTIME FOR CHOLESTEROL, Disp: 90 tablet, Rfl: 1 .  bimatoprost (LUMIGAN) 0.01 % SOLN, Place 1 drop into both eyes at bedtime. , Disp: , Rfl:  .  Blood Glucose Monitoring Suppl (ONE TOUCH ULTRA 2) w/Device KIT, OneTouch Ultra 2 device; Dx E11.9, insulin-dependent, LON 99 months; check fingerstick blood sugars four times a day, fluctuating, needs more frequent monitoring, Disp: 1 each, Rfl: 0 .  finasteride (PROSCAR) 5 MG tablet, Take 1 tablet (5 mg total) daily by mouth., Disp: 90 tablet, Rfl: 3 .  gabapentin (NEURONTIN) 300 MG capsule, Take 300 mg by mouth 3 (three) times daily., Disp: , Rfl: 1 .  glucose blood test strip, To  match the OneTouch Ultra 2 device; Dx E11.9, insulin-dependent, LON 99 months; check fingerstick blood sugars four times a day, fluctuating, needs more frequent monitoring, Disp: 200 each, Rfl: 12 .  Insulin Detemir (LEVEMIR FLEXTOUCH) 100 UNIT/ML Pen, Inject 34 Units into the skin every morning., Disp: , Rfl:  .  Insulin Pen Needle (PEN NEEDLES) 31G X 6 MM MISC, For use with pen device to inject insulin once a day; LON 99 months, E11.9, Z79.4, Disp: 100 each, Rfl: 3 .  irbesartan (AVAPRO) 150 MG tablet, TAKE 1 TABLET BY MOUTH  DAILY, Disp: 90 tablet, Rfl: 1 .  metFORMIN (GLUCOPHAGE) 1000 MG tablet, TAKE 1 TABLET BY MOUTH TWO  TIMES DAILY WITH A MEAL, Disp: 180 tablet, Rfl: 1 .  Multiple Vitamin (MULTI-VITAMINS) TABS, Take 1 tablet by mouth 2 (two) times daily. , Disp: , Rfl:  .  ONE TOUCH LANCETS MISC, Check fingerstick blood sugars four times a day; LON 99 months; type 2 diabetes on insulin E11.9, fluctuating sugars, Disp: 200 each, Rfl: 11 .  Probiotic Product (PROBIOTIC DAILY) CAPS, Take 1 capsule by mouth every evening., Disp: , Rfl:  .  SIMBRINZA 1-0.2 % SUSP, Apply 1 drop to eye 3 (three) times daily. , Disp: , Rfl:  .  tamsulosin (FLOMAX) 0.4 MG CAPS capsule, TAKE 1 CAPSULE BY MOUTH  DAILY, Disp: 90 capsule, Rfl: 1  Physical exam:  Vitals:   04/02/17 1408  BP: 118/63  Pulse: 84  Temp: 98.2 F (36.8 C)  TempSrc: Oral  Weight: 129 lb (58.5 kg)  Height: 5' 8" (1.727 m)   Physical Exam  Constitutional: He is oriented to person, place, and time and well-developed, well-nourished, and in no distress.  HENT:  Head: Normocephalic and atraumatic.  Eyes: EOM are normal. Pupils are equal, round, and reactive to light.  Neck: Normal range of motion.  Cardiovascular: Normal rate, regular rhythm and normal heart sounds.  Pulmonary/Chest: Effort normal and breath sounds normal.  Abdominal: Soft. Bowel sounds are normal.  Neurological: He is alert and oriented to person, place, and time.    Skin: Skin is warm and dry.     CMP Latest Ref Rng & Units 11/25/2016  Glucose 65 - 99 mg/dL 148(H)  BUN 6 - 20 mg/dL 23(H)  Creatinine 0.61 - 1.24 mg/dL 0.88  Sodium 135 - 145 mmol/L 137  Potassium 3.5 - 5.1 mmol/L 4.3  Chloride 101 - 111 mmol/L 100(L)  CO2 22 - 32 mmol/L 28  Calcium 8.9 - 10.3  mg/dL 9.6  Total Protein 6.5 - 8.1 g/dL 7.3  Total Bilirubin 0.3 - 1.2 mg/dL 0.4  Alkaline Phos 38 - 126 U/L 84  AST 15 - 41 U/L 19  ALT 17 - 63 U/L 17   CBC Latest Ref Rng & Units 03/17/2017  WBC 3.8 - 10.6 K/uL 10.8(H)  Hemoglobin 13.0 - 18.0 g/dL 11.6(L)  Hematocrit 40.0 - 52.0 % 35.5(L)  Platelets 150 - 440 K/uL 579(H)    No images are attached to the encounter.  Ct Bone Marrow Biopsy & Aspiration  Result Date: 03/17/2017 INDICATION: Thrombocytosis EXAM: CT-GUIDED BONE MARROW BIOPSY AND ASPIRATION MEDICATIONS: None. ANESTHESIA/SEDATION: Moderate (conscious) sedation was employed during this procedure. A total of Versed 1 mg and Fentanyl 50 mcg was administered intravenously. Moderate Sedation Time: 15 minutes. The patient's level of consciousness and vital signs were monitored continuously by radiology nursing throughout the procedure under my direct supervision. FLUOROSCOPY TIME:  Not applicable COMPLICATIONS: None immediate. PROCEDURE: Informed written consent was obtained from the patient after a thorough discussion of the procedural risks, benefits and alternatives. All questions were addressed. Maximal Sterile Barrier Technique was utilized including caps, mask, sterile gowns, sterile gloves, sterile drape, hand hygiene and skin antiseptic. A timeout was performed prior to the initiation of the procedure. Initial scanning was performed to obtain and access site in the left iliac bone. Utilizing 0.25% bupivacaine and CT guidance an OnControl biopsy needle was placed into the left iliac bone. Initial aspirates were obtained and deemed adequate by pathology. Subsequently a large core from  the left iliac bone was obtained and split into two 1.5 cm segments. Hemostasis was obtained at the puncture site. Puncture site was dressed in the standard sterile manner. The patient tolerated the procedure well and was returned his room in satisfactory condition. IMPRESSION: Successful CT-guided bone marrow biopsy. Electronically Signed   By: Inez Catalina M.D.   On: 03/17/2017 11:36     Assessment and plan- Patient is a 72 y.o. male with following issues:  1. Thrombocytosis: Platelet count has been around 400 to 500 with occasional variation since 2013.  Jak 2 Keller and MPL mutation testing was negative and there is no evidence of MDS or myeloproliferative neoplasm findings in the bone marrow.  Therefore his thrombocytosis is reactive and can be monitored conservatively without any further treatment.  Secondary thrombocytosis does not carry a risk of thromboembolic events independently  2.  Normocytic anemia: Patient's hemoglobin has been between 10-11 since September 2017.  Anemia workup has been essentially unremarkable.  He did have mildly elevated kappa free light chains with a normal free light chain ratio.  No evidence of M protein on SPEP or immunofixation.  No evidence of plasma cell neoplasm in his bone marrow.  Continue to monitor  3.  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  I do not feel that this is contributing to his anemia.  Patient did have a CT chest abdomen and pelvis with contrast in October 2018 which did not show any evidence of significant adenopathy that would be suggestive of a lymphoma.  If patient develops any significant cytopenias or palpable adenopathy in the future a repeat bone marrow biopsy could be considered at that time    Repeat CBC with differential in 3 months in 6 months  and I will see  him back in 6 months   Visit Diagnosis 1. Thrombocytosis ([place])   2. Normocytic anemia      Dr. Randa Evens, MD, MPH [facility] at [facility] Pager- [phone] 04/03/2017 10:33 AM

## 2017-04-04 NOTE — Progress Notes (Signed)
Closing out old note, open since 11/25/2016

## 2017-04-13 ENCOUNTER — Other Ambulatory Visit: Payer: Self-pay | Admitting: Family Medicine

## 2017-04-13 MED ORDER — RANITIDINE HCL 150 MG PO TABS: 150.0000 mg | ORAL_TABLET | Freq: Two times a day (BID) | ORAL | 1 refills | Status: DC | PRN

## 2017-04-13 NOTE — Telephone Encounter (Signed)
See 03/03/17 phone note Stop PPi and start H2 blocker

## 2017-05-25 ENCOUNTER — Ambulatory Visit: Payer: Medicare Other | Admitting: Family Medicine

## 2017-06-01 ENCOUNTER — Ambulatory Visit: Payer: Medicare Other | Admitting: Family Medicine

## 2017-06-24 ENCOUNTER — Encounter: Payer: Self-pay | Admitting: Urology

## 2017-06-24 ENCOUNTER — Ambulatory Visit: Payer: Medicare Other | Admitting: Urology

## 2017-06-24 VITALS — BP 120/67 | HR 79 | Resp 16 | Ht 68.0 in | Wt 129.8 lb

## 2017-06-24 DIAGNOSIS — N401 Enlarged prostate with lower urinary tract symptoms: Secondary | ICD-10-CM | POA: Diagnosis not present

## 2017-06-24 LAB — URINALYSIS, COMPLETE
Bilirubin, UA: NEGATIVE
Leukocytes, UA: NEGATIVE
Nitrite, UA: NEGATIVE
PH UA: 5 (ref 5.0–7.5)
RBC, UA: NEGATIVE
Specific Gravity, UA: 1.02 (ref 1.005–1.030)
UUROB: 0.2 mg/dL (ref 0.2–1.0)

## 2017-06-24 LAB — MICROSCOPIC EXAMINATION
BACTERIA UA: NONE SEEN
EPITHELIAL CELLS (NON RENAL): NONE SEEN /HPF (ref 0–10)
RBC, UA: NONE SEEN /hpf (ref 0–2)
WBC, UA: NONE SEEN /hpf (ref 0–5)

## 2017-06-24 LAB — BLADDER SCAN AMB NON-IMAGING

## 2017-06-24 NOTE — Progress Notes (Signed)
06/24/2017 12:30 PM   Garrett Yoder 21-Feb-1945 [id number]  Referring provider: [name], MD [address] [place], [place] [address]  Chief Complaint  Patient presents with  . Follow-up    HPI: 72 year old male presents for a six-month follow-up.  I saw him in November 2018 for worsening urinary frequency and urgency. He had been on tamsulosin and finasteride was added.  He states after 3 months he noted significant improvement in his voiding symptoms with marked improvement in his frequency and urgency.  He is presently satisfied with his voiding pattern and desires to continue combination therapy. He denies dysuria or gross hematuria.  He has no flank, abdominal, pelvic or scrotal pain.   PMH: Past Medical History:  Diagnosis Date  . Anemia 08/14/2015  . Anxiety   . Aortic atherosclerosis ([place]) 11/25/2016   Chest CT Oct 2018  . Aorto-iliac atherosclerosis ([place]) 11/25/2016   Abd/pelvic CT Oct 2018  . Coronary artery calcification seen on CAT scan 11/25/2016   Chest CT Oct 2018; refer to Dr. Fletcher Anon  . DDD (degenerative disc disease), cervical 08/21/2016  . Depression   . Diabetes mellitus without complication ([place])   . Facet arthropathy, cervical 08/21/2016  . GERD (gastroesophageal reflux disease)   . Heart murmur    As child  . Hyperlipidemia   . Hypertension   . Hypertrophy of prostate with urinary obstruction 11/25/2016   Pelvic CT October 2018  . Incidental lung nodule, > 79m and < 861m10/10/2016   5 mm nodule LLL, chest CT Oct 2018  . Thrombocytosis ([place]/2017    Surgical History: Past Surgical History:  Procedure Laterality Date  . ANTERIOR CERVICAL DECOMP/DISCECTOMY FUSION N/A 09/24/2016   Procedure: ANTERIOR CERVICAL DECOMPRESSION/DISCECTOMY FUSION 3 LEVELS-C4-7;  Surgeon: YaMeade MawMD;  Location: [facility] ORS;  Service: Neurosurgery;  Laterality: N/A;  . CATARACT EXTRACTION    . EYE SURGERY Right    Cataract Extraction with IOL    . RETINAL LASER PROCEDURE      Home Medications:  Allergies as of 06/24/2017      Reactions   Codeine Nausea Only   Other reaction(s): makes him sick to his stomach Other reaction(s): makes him sick to his stomach      Medication List        Accurate as of 06/24/17 12:30 PM. Always use your most recent med list.          acetaminophen 500 MG tablet Commonly known as:  TYLENOL Take 1,000 mg by mouth 2 (two) times daily.   aspirin EC 81 MG tablet Take 81 mg by mouth daily.   atorvastatin 40 MG tablet Commonly known as:  LIPITOR TAKE 1 TABLET BY MOUTH AT  BEDTIME FOR CHOLESTEROL   bimatoprost 0.01 % Soln Commonly known as:  LUMIGAN Place 1 drop into both eyes at bedtime.   finasteride 5 MG tablet Commonly known as:  PROSCAR Take 1 tablet (5 mg total) daily by mouth.   gabapentin 300 MG capsule Commonly known as:  NEURONTIN Take 300 mg by mouth 3 (three) times daily.   glucose blood test strip To match the OneTouch Ultra 2 device; Dx E11.9, insulin-dependent, LON 99 months; check fingerstick blood sugars four times a day, fluctuating, needs more frequent monitoring   irbesartan 150 MG tablet Commonly known as:  AVAPRO TAKE 1 TABLET BY MOUTH  DAILY   LEVEMIR FLEXTOUCH 100 UNIT/ML Pen Generic drug:  Insulin Detemir Inject 34 Units into the skin  every morning.   metFORMIN 1000 MG tablet Commonly known as:  GLUCOPHAGE TAKE 1 TABLET BY MOUTH TWO  TIMES DAILY WITH A MEAL   MULTI-VITAMINS Tabs Take 1 tablet by mouth 2 (two) times daily.   ONE TOUCH LANCETS Misc Check fingerstick blood sugars four times a day; LON 99 months; type 2 diabetes on insulin E11.9, fluctuating sugars   ONE TOUCH ULTRA 2 w/Device Kit OneTouch Ultra 2 device; Dx E11.9, insulin-dependent, LON 99 months; check fingerstick blood sugars four times a day, fluctuating, needs more frequent monitoring   Pen Needles 31G X 6 MM Misc For use with pen device to inject insulin once a day; LON 99  months, E11.9, Z79.4   PROBIOTIC DAILY Caps Take 1 capsule by mouth every evening.   ranitidine 150 MG tablet Commonly known as:  ZANTAC Take 1 tablet (150 mg total) by mouth 2 (two) times daily as needed for heartburn.   SIMBRINZA 1-0.2 % Susp Generic drug:  Brinzolamide-Brimonidine Apply 1 drop to eye 3 (three) times daily.   tamsulosin 0.4 MG Caps capsule Commonly known as:  FLOMAX TAKE 1 CAPSULE BY MOUTH  DAILY       Allergies:  Allergies  Allergen Reactions  . Codeine Nausea Only    Other reaction(s): makes him sick to his stomach Other reaction(s): makes him sick to his stomach    Family History: Family History  Problem Relation Age of Onset  . Stroke Mother   . Diabetes Mellitus II Father   . Heart Problems Father   . Heart disease Father   . Diabetes Sister   . Cancer Maternal Grandfather   . Stroke Paternal Grandmother   . Prostate cancer Neg Hx   . Bladder Cancer Neg Hx   . Kidney cancer Neg Hx     Social History:  reports that he quit smoking about 17 years ago. His smoking use included cigarettes. He has a 30.00 pack-year smoking history. He has never used smokeless tobacco. He reports that he does not drink alcohol or use drugs.  ROS: UROLOGY Frequent Urination?: No Hard to postpone urination?: No Burning/pain with urination?: No Get up at night to urinate?: No Leakage of urine?: No Urine stream starts and stops?: No Trouble starting stream?: No Do you have to strain to urinate?: No Blood in urine?: No Urinary tract infection?: No Sexually transmitted disease?: No Injury to kidneys or bladder?: No Painful intercourse?: No Weak stream?: No Erection problems?: No Penile pain?: No  Gastrointestinal Nausea?: No Vomiting?: No Indigestion/heartburn?: No Diarrhea?: No Constipation?: No  Constitutional Fever: No Night sweats?: No Weight loss?: No Fatigue?: No  Skin Skin rash/lesions?: No Itching?: No  Eyes Blurred vision?:  No Double vision?: No  Ears/Nose/Throat Sore throat?: No Sinus problems?: No  Hematologic/Lymphatic Swollen glands?: No Easy bruising?: No  Cardiovascular Leg swelling?: No Chest pain?: No  Respiratory Cough?: No Shortness of breath?: No  Endocrine Excessive thirst?: No  Musculoskeletal Back pain?: No Joint pain?: No  Neurological Headaches?: No Dizziness?: No  Psychologic Depression?: No Anxiety?: No  Physical Exam: BP 120/67   Pulse 79   Resp 16   Ht 5' 8"  (1.727 m)   Wt 129 lb 12.8 oz (58.9 kg)   SpO2 98%   BMI 19.74 kg/m   Constitutional:  Alert and oriented, No acute distress. HEENT: [place] AT, moist mucus membranes.  Trachea midline, no masses. Cardiovascular: No clubbing, cyanosis, or edema. Respiratory: Normal respiratory effort, no increased work of breathing. GI: Abdomen is  soft, nontender, nondistended, no abdominal masses GU: No CVA tenderness Lymph: No cervical or inguinal lymphadenopathy. Skin: No rashes, bruises or suspicious lesions. Neurologic: Grossly intact, no focal deficits, moving all 4 extremities. Psychiatric: Normal mood and affect.  Laboratory Data: Lab Results  Component Value Date   WBC 10.8 (H) 03/17/2017   HGB 11.6 (L) 03/17/2017   HCT 35.5 (L) 03/17/2017   MCV 93.3 03/17/2017   PLT 579 (H) 03/17/2017    Lab Results  Component Value Date   CREATININE 0.88 11/25/2016    Assessment & Plan:    1. Benign prostatic hyperplasia with lower urinary tract symptoms, symptom details unspecified Significant improvement in his voiding pattern with the addition of finasteride.  This medication was refilled.  Follow-up annually or as needed for any significant change in his symptoms.  PVR by bladder scan today was 32 mL.  - Urinalysis, Complete - BLADDER SCAN AMB NON-IMAGING    Abbie Sons, MD  [facility] [address] [place], [place] [address] [phone]

## 2017-06-30 ENCOUNTER — Inpatient Hospital Stay: Payer: Medicare Other | Attending: Oncology

## 2017-06-30 DIAGNOSIS — D75839 Thrombocytosis, unspecified: Secondary | ICD-10-CM

## 2017-06-30 DIAGNOSIS — D473 Essential (hemorrhagic) thrombocythemia: Secondary | ICD-10-CM | POA: Diagnosis not present

## 2017-06-30 LAB — CBC WITH DIFFERENTIAL/PLATELET
BASOS PCT: 1 %
Basophils Absolute: 0.1 10*3/uL (ref 0–0.1)
Eosinophils Absolute: 0.1 10*3/uL (ref 0–0.7)
Eosinophils Relative: 2 %
HEMATOCRIT: 31.6 % — AB (ref 40.0–52.0)
Hemoglobin: 10.8 g/dL — ABNORMAL LOW (ref 13.0–18.0)
Lymphocytes Relative: 23 %
Lymphs Abs: 1.6 10*3/uL (ref 1.0–3.6)
MCH: 31.3 pg (ref 26.0–34.0)
MCHC: 34.2 g/dL (ref 32.0–36.0)
MCV: 91.6 fL (ref 80.0–100.0)
MONO ABS: 0.5 10*3/uL (ref 0.2–1.0)
Monocytes Relative: 7 %
NEUTROS ABS: 4.8 10*3/uL (ref 1.4–6.5)
Neutrophils Relative %: 67 %
Platelets: 556 10*3/uL — ABNORMAL HIGH (ref 150–440)
RBC: 3.45 MIL/uL — ABNORMAL LOW (ref 4.40–5.90)
RDW: 13.3 % (ref 11.5–14.5)
WBC: 7.1 10*3/uL (ref 3.8–10.6)

## 2017-07-08 DIAGNOSIS — H401132 Primary open-angle glaucoma, bilateral, moderate stage: Secondary | ICD-10-CM | POA: Diagnosis not present

## 2017-07-10 ENCOUNTER — Ambulatory Visit: Payer: Medicare Other | Admitting: Family Medicine

## 2017-07-15 DIAGNOSIS — H401132 Primary open-angle glaucoma, bilateral, moderate stage: Secondary | ICD-10-CM | POA: Diagnosis not present

## 2017-07-15 LAB — HM DIABETES EYE EXAM

## 2017-07-17 ENCOUNTER — Encounter: Payer: Self-pay | Admitting: Family Medicine

## 2017-07-17 ENCOUNTER — Ambulatory Visit (INDEPENDENT_AMBULATORY_CARE_PROVIDER_SITE_OTHER): Payer: Medicare Other | Admitting: Family Medicine

## 2017-07-17 ENCOUNTER — Other Ambulatory Visit: Payer: Self-pay | Admitting: Family Medicine

## 2017-07-17 VITALS — BP 120/62 | HR 84 | Temp 98.5°F | Resp 14 | Ht 68.0 in | Wt 130.4 lb

## 2017-07-17 DIAGNOSIS — E1165 Type 2 diabetes mellitus with hyperglycemia: Secondary | ICD-10-CM

## 2017-07-17 DIAGNOSIS — N289 Disorder of kidney and ureter, unspecified: Secondary | ICD-10-CM

## 2017-07-17 DIAGNOSIS — D75839 Thrombocytosis, unspecified: Secondary | ICD-10-CM

## 2017-07-17 DIAGNOSIS — I700 Atherosclerosis of aorta: Secondary | ICD-10-CM | POA: Diagnosis not present

## 2017-07-17 DIAGNOSIS — R918 Other nonspecific abnormal finding of lung field: Secondary | ICD-10-CM

## 2017-07-17 DIAGNOSIS — E782 Mixed hyperlipidemia: Secondary | ICD-10-CM

## 2017-07-17 DIAGNOSIS — I10 Essential (primary) hypertension: Secondary | ICD-10-CM | POA: Diagnosis not present

## 2017-07-17 DIAGNOSIS — Z5181 Encounter for therapeutic drug level monitoring: Secondary | ICD-10-CM

## 2017-07-17 DIAGNOSIS — I2510 Atherosclerotic heart disease of native coronary artery without angina pectoris: Secondary | ICD-10-CM | POA: Diagnosis not present

## 2017-07-17 DIAGNOSIS — E119 Type 2 diabetes mellitus without complications: Secondary | ICD-10-CM

## 2017-07-17 DIAGNOSIS — R911 Solitary pulmonary nodule: Secondary | ICD-10-CM | POA: Diagnosis not present

## 2017-07-17 DIAGNOSIS — D649 Anemia, unspecified: Secondary | ICD-10-CM | POA: Diagnosis not present

## 2017-07-17 DIAGNOSIS — D473 Essential (hemorrhagic) thrombocythemia: Secondary | ICD-10-CM

## 2017-07-17 NOTE — Assessment & Plan Note (Signed)
Asymptomatic; saw cardiologist; manage risk factors

## 2017-07-17 NOTE — Assessment & Plan Note (Signed)
Managed by  Cancer center

## 2017-07-17 NOTE — Assessment & Plan Note (Signed)
Managed by cancer center 

## 2017-07-17 NOTE — Progress Notes (Signed)
Check BMP in one month

## 2017-07-17 NOTE — Patient Instructions (Addendum)
Starting tomorrow (Saturday), take 37 units of Levemir daily; if sugars are not under 150 consisently, then increase to 40 units after 3 days (Tuesday); continue increasing the insulin by 3 units every 3 days until you get numbers down to a max of 60 units Call me if you decide to start the oral agent Continue the metformin Please call [phone] to schedule your imaging test for October 7th or just after (chest CT) Please wait 2-3 days after the order has been placed to call and get your test scheduled Contact me if having trouble with sugars

## 2017-07-17 NOTE — Assessment & Plan Note (Signed)
Check lipids today; limit saturated fats 

## 2017-07-17 NOTE — Assessment & Plan Note (Signed)
So close to goal; aim to get LDL under 70; check lipids today

## 2017-07-17 NOTE — Assessment & Plan Note (Signed)
Excellent control today; continue same

## 2017-07-17 NOTE — Assessment & Plan Note (Signed)
Continue metformin; increase levemir by 3 units every 3 days to max of 60 units; offered another oral agent, but will see how the insulin does first

## 2017-07-17 NOTE — Assessment & Plan Note (Signed)
rescan chest on November 21, 2017 or just after; ordered today

## 2017-07-17 NOTE — Progress Notes (Signed)
BP 120/62   Pulse 84   Temp 98.5 F (36.9 C) (Oral)   Resp 14   Ht 5\' 8"  (1.727 m)   Wt 130 lb 6.4 oz (59.1 kg)   SpO2 95%   BMI 19.83 kg/m    Subjective:    Patient ID: Garrett Spikes., male    DOB: 23-Feb-1945, 72 y.o.   MRN: [id number]  HPI: Garrett Pulis. is a 72 y.o. male  Chief Complaint  Patient presents with  . Follow-up    HPI Patient is here for f/u  He has type 2 diabetes; his numbers are not pretty he says; checking 2x a day; he brought in his numbers; if he tries to follow true diabetic diet, loses weight Numbers reviewed; since May 6th, highest 408, lowest 156; most being in the 200 and 300 range No lows On levemir, 34 units; on metformin max dose  Lab Results  Component Value Date   CHOL 155 07/24/2016   HDL 62 07/24/2016   LDLCALC 79 07/24/2016   TRIG 72 07/24/2016   CHOLHDL 2.7 12/14/2015   BP is excellent Aortic athero; last LDL 79 CAD; noted on exam; no chest pain; Dr. Fletcher Anon saw him and turned him loose; taking aspirin Thrombocytopenia; just occasional bruises on the hands, but not real bad; no bleeding from gums, nose, urine, rectum  Depression screen [facility] 2/9 07/17/2017 02/24/2017 12/03/2016 11/25/2016 11/11/2016  Decreased Interest 0 0 0 0 0  Down, Depressed, Hopeless 0 0 0 0 0  PHQ - 2 Score 0 0 0 0 0    Relevant past medical, surgical, family and social history reviewed Past Medical History:  Diagnosis Date  . Anemia 08/14/2015  . Anxiety   . Aortic atherosclerosis ([place]) 11/25/2016   Chest CT Oct 2018  . Aorto-iliac atherosclerosis ([place]) 11/25/2016   Abd/pelvic CT Oct 2018  . Coronary artery calcification seen on CAT scan 11/25/2016   Chest CT Oct 2018; refer to Dr. Fletcher Anon  . DDD (degenerative disc disease), cervical 08/21/2016  . Depression   . Diabetes mellitus without complication ([place])   . Facet arthropathy, cervical 08/21/2016  . GERD (gastroesophageal reflux disease)   . Heart murmur    As child  . Hyperlipidemia   .  Hypertension   . Hypertrophy of prostate with urinary obstruction 11/25/2016   Pelvic CT October 2018  . Incidental lung nodule, > 73mm and < 70mm 11/25/2016   5 mm nodule LLL, chest CT Oct 2018  . Thrombocytosis ([place]) 08/14/2015   Past Surgical History:  Procedure Laterality Date  . ANTERIOR CERVICAL DECOMP/DISCECTOMY FUSION N/A 09/24/2016   Procedure: ANTERIOR CERVICAL DECOMPRESSION/DISCECTOMY FUSION 3 LEVELS-C4-7;  Surgeon: Meade Maw, MD;  Location: [facility] ORS;  Service: Neurosurgery;  Laterality: N/A;  . CATARACT EXTRACTION    . EYE SURGERY Right    Cataract Extraction with IOL  . RETINAL LASER PROCEDURE     Family History  Problem Relation Age of Onset  . Stroke Mother   . Diabetes Mellitus II Father   . Heart Problems Father   . Heart disease Father   . Diabetes Sister   . Cancer Maternal Grandfather   . Stroke Paternal Grandmother   . Prostate cancer Neg Hx   . Bladder Cancer Neg Hx   . Kidney cancer Neg Hx    Social History   Tobacco Use  . Smoking status: Former Smoker    Packs/day: 1.00    Years: 30.00    Pack  years: 30.00    Types: Cigarettes    Last attempt to quit: 07/19/1999    Years since quitting: 18.0  . Smokeless tobacco: Never Used  Substance Use Topics  . Alcohol use: No    Alcohol/week: 0.0 oz  . Drug use: No    Interim medical history since last visit reviewed. Allergies and medications reviewed  Review of Systems Per HPI unless specifically indicated above     Objective:    BP 120/62   Pulse 84   Temp 98.5 F (36.9 C) (Oral)   Resp 14   Ht 5\' 8"  (1.727 m)   Wt 130 lb 6.4 oz (59.1 kg)   SpO2 95%   BMI 19.83 kg/m   Wt Readings from Last 3 Encounters:  07/17/17 130 lb 6.4 oz (59.1 kg)  06/24/17 129 lb 12.8 oz (58.9 kg)  04/02/17 129 lb (58.5 kg)    Physical Exam  Constitutional: He appears well-developed and well-nourished. No distress.  HENT:  Head: Normocephalic and atraumatic.  Eyes: EOM are normal. No scleral icterus.    Neck: No thyromegaly present.  Cardiovascular: Normal rate and regular rhythm.  Pulmonary/Chest: Effort normal and breath sounds normal.  Abdominal: Soft. Bowel sounds are normal. He exhibits no distension.  Musculoskeletal: He exhibits no edema.  Neurological: Coordination normal.  Skin: Skin is warm and dry. No pallor.  Psychiatric: He has a normal mood and affect. His behavior is normal. Judgment and thought content normal.   Diabetic Foot Form - Detailed   Diabetic Foot Exam - detailed Diabetic Foot exam was performed with the following findings:  Yes 07/17/2017 10:44 AM  Visual Foot Exam completed.:  Yes  Pulse Foot Exam completed.:  Yes  Right Dorsalis Pedis:  Present Left Dorsalis Pedis:  Present  Sensory Foot Exam Completed.:  Yes Semmes-Weinstein Monofilament Test R Site 1-Great Toe:  Pos L Site 1-Great Toe:  Pos        Results for orders placed or performed in visit on 06/30/17  CBC with Differential/Platelet  Result Value Ref Range   WBC 7.1 3.8 - 10.6 K/uL   RBC 3.45 (L) 4.40 - 5.90 MIL/uL   Hemoglobin 10.8 (L) 13.0 - 18.0 g/dL   HCT 31.6 (L) 40.0 - 52.0 %   MCV 91.6 80.0 - 100.0 fL   MCH 31.3 26.0 - 34.0 pg   MCHC 34.2 32.0 - 36.0 g/dL   RDW 13.3 11.5 - 14.5 %   Platelets 556 (H) 150 - 440 K/uL   Neutrophils Relative % 67 %   Neutro Abs 4.8 1.4 - 6.5 K/uL   Lymphocytes Relative 23 %   Lymphs Abs 1.6 1.0 - 3.6 K/uL   Monocytes Relative 7 %   Monocytes Absolute 0.5 0.2 - 1.0 K/uL   Eosinophils Relative 2 %   Eosinophils Absolute 0.1 0 - 0.7 K/uL   Basophils Relative 1 %   Basophils Absolute 0.1 0 - 0.1 K/uL      Assessment & Plan:   Problem List Items Addressed This Visit      Cardiovascular and Mediastinum   Essential hypertension, benign    Excellent control today; continue same      Coronary artery calcification seen on CAT scan    Asymptomatic; saw cardiologist; manage risk factors      Aortic atherosclerosis ([place])    So close to goal; aim  to get LDL under 70; check lipids today      Relevant Orders   Lipid panel  Endocrine   Uncontrolled type 2 diabetes mellitus (HCC) (Chronic)    Continue metformin; increase levemir by 3 units every 3 days to max of 60 units; offered another oral agent, but will see how the insulin does first      Relevant Medications   Insulin Detemir (LEVEMIR FLEXTOUCH) 100 UNIT/ML Pen     Hematopoietic and Hemostatic   Thrombocytosis ([place])    Managed by cancer center        Other   Medication monitoring encounter   Relevant Orders   COMPLETE METABOLIC PANEL WITH GFR   Incidental lung nodule, > 65mm and < 92mm    rescan chest on November 21, 2017 or just after; ordered today      Relevant Orders   CT Chest Wo Contrast   Hyperlipidemia    Check lipids today; limit saturated fats      Relevant Orders   Lipid panel   Anemia    Managed by  Cancer center       Other Visit Diagnoses    Diabetes mellitus without complication ([place])    -  Primary   Relevant Medications   Insulin Detemir (LEVEMIR FLEXTOUCH) 100 UNIT/ML Pen   Other Relevant Orders   Urine Microalbumin w/creat. ratio   Hemoglobin A1C   Abnormal findings on diagnostic imaging of lung       Relevant Orders   CT Chest Wo Contrast       Follow up plan: Return in about 3 months (around 10/17/2017) for follow-up visit with Dr. Sanda Klein.  An after-visit summary was printed and given to the patient at [place].  Please see the patient instructions which may contain other information and recommendations beyond what is mentioned above in the assessment and plan.  No orders of the defined types were placed in this encounter.   Orders Placed This Encounter  Procedures  . CT Chest Wo Contrast  . Urine Microalbumin w/creat. ratio  . Hemoglobin A1C  . COMPLETE METABOLIC PANEL WITH GFR  . Lipid panel

## 2017-07-18 ENCOUNTER — Other Ambulatory Visit: Payer: Self-pay | Admitting: Family Medicine

## 2017-07-18 LAB — LIPID PANEL
CHOL/HDL RATIO: 2.3 (calc) (ref <5.0)
Cholesterol: 129 mg/dL (ref <200)
HDL: 55 mg/dL (ref >40)
LDL CHOLESTEROL (CALC): 59 mg/dL
Non-HDL Cholesterol (Calc): 74 mg/dL (calc) (ref <130)
TRIGLYCERIDES: 69 mg/dL (ref <150)

## 2017-07-18 LAB — COMPLETE METABOLIC PANEL WITH GFR
AG Ratio: 2.2 (calc) (ref 1.0–2.5)
ALT: 15 U/L (ref 9–46)
AST: 13 U/L (ref 10–35)
Albumin: 4.7 g/dL (ref 3.6–5.1)
Alkaline phosphatase (APISO): 73 U/L (ref 40–115)
BILIRUBIN TOTAL: 0.3 mg/dL (ref 0.2–1.2)
BUN / CREAT RATIO: 21 (calc) (ref 6–22)
BUN: 26 mg/dL — AB (ref 7–25)
CALCIUM: 9.7 mg/dL (ref 8.6–10.3)
CHLORIDE: 104 mmol/L (ref 98–110)
CO2: 26 mmol/L (ref 20–32)
CREATININE: 1.23 mg/dL — AB (ref 0.70–1.18)
GFR, EST AFRICAN AMERICAN: 68 mL/min/{1.73_m2} (ref >=60)
GFR, EST NON AFRICAN AMERICAN: 59 mL/min/{1.73_m2} — AB (ref >=60)
GLUCOSE: 151 mg/dL — AB (ref 65–139)
Globulin: 2.1 g/dL (calc) (ref 1.9–3.7)
Potassium: 4.6 mmol/L (ref 3.5–5.3)
Sodium: 140 mmol/L (ref 135–146)
TOTAL PROTEIN: 6.8 g/dL (ref 6.1–8.1)

## 2017-07-18 LAB — HEMOGLOBIN A1C
EAG (MMOL/L): 10.1 (calc)
HEMOGLOBIN A1C: 8 %{Hb} — AB (ref <5.7)
MEAN PLASMA GLUCOSE: 183 (calc)

## 2017-07-18 LAB — MICROALBUMIN / CREATININE URINE RATIO
CREATININE, URINE: 123 mg/dL (ref 20–320)
MICROALB UR: 3 mg/dL
Microalb Creat Ratio: 24 mcg/mg creat (ref <30)

## 2017-07-21 NOTE — Telephone Encounter (Signed)
We'll wait to see what he lands at before sending in yet another prescription

## 2017-07-21 NOTE — Telephone Encounter (Signed)
Patient called back- he is presently in the process on increasing his dosage by 3 units every 3 days until he gets to a good glucose reading. Patient has just increased to 40 units today and will increase again in 3 days.- He was instructed he can go up to 60 units. He states Dr Sanda Klein can treat him as if he is using 58 for now if she wants - until they figure out his new normal or call in the medication how ever she sees fit.

## 2017-07-21 NOTE — Telephone Encounter (Signed)
Patient was just here Rx request and medicine list have very different amounts of the insulin units (Levemir) Please clarify with patient and correct one or the other so I can send in the refill Thank you

## 2017-07-21 NOTE — Telephone Encounter (Signed)
Left voicemail with wife.

## 2017-08-13 DIAGNOSIS — M4322 Fusion of spine, cervical region: Secondary | ICD-10-CM | POA: Diagnosis not present

## 2017-08-13 DIAGNOSIS — M4712 Other spondylosis with myelopathy, cervical region: Secondary | ICD-10-CM | POA: Diagnosis not present

## 2017-08-13 DIAGNOSIS — Z981 Arthrodesis status: Secondary | ICD-10-CM | POA: Diagnosis not present

## 2017-08-25 DIAGNOSIS — H34811 Central retinal vein occlusion, right eye, with macular edema: Secondary | ICD-10-CM | POA: Diagnosis not present

## 2017-08-31 ENCOUNTER — Other Ambulatory Visit: Payer: Self-pay | Admitting: Family Medicine

## 2017-08-31 DIAGNOSIS — E119 Type 2 diabetes mellitus without complications: Secondary | ICD-10-CM

## 2017-08-31 NOTE — Telephone Encounter (Signed)
Left detailed voicemail to contact us back

## 2017-08-31 NOTE — Telephone Encounter (Signed)
Please contact patient and confirm how much Levemir he is using; refill request has one number, our current med list has another; please correct and then back to me

## 2017-09-02 NOTE — Telephone Encounter (Signed)
Pt is using 43 units of Levemir

## 2017-09-02 NOTE — Telephone Encounter (Signed)
Patient called back after nurse and Dr were gone for the day. Please call patient back

## 2017-09-03 MED ORDER — INSULIN DETEMIR 100 UNIT/ML FLEXPEN: 43.0000 [IU] | PEN_INJECTOR | Freq: Every morning | SUBCUTANEOUS | 5 refills | Status: DC

## 2017-09-17 ENCOUNTER — Other Ambulatory Visit: Payer: Self-pay | Admitting: Family Medicine

## 2017-09-17 NOTE — Telephone Encounter (Signed)
Left voice mail

## 2017-09-17 NOTE — Telephone Encounter (Signed)
Please see lab note about getting a BMP (May) He is overdue and I really need this for his metformin and irbesartan Please re-order BMP if not in order review or up front and ask him to come in today or tomorrow

## 2017-09-22 ENCOUNTER — Other Ambulatory Visit: Payer: Self-pay

## 2017-09-22 MED ORDER — FINASTERIDE 5 MG PO TABS: 5.0000 mg | ORAL_TABLET | Freq: Every day | ORAL | 3 refills | Status: DC

## 2017-10-01 ENCOUNTER — Inpatient Hospital Stay: Payer: Medicare Other | Attending: Oncology | Admitting: Oncology

## 2017-10-01 ENCOUNTER — Encounter: Payer: Self-pay | Admitting: Oncology

## 2017-10-01 ENCOUNTER — Inpatient Hospital Stay: Payer: Medicare Other

## 2017-10-01 VITALS — BP 126/72 | HR 84 | Temp 97.4°F | Resp 18 | Ht 68.0 in | Wt 130.8 lb

## 2017-10-01 DIAGNOSIS — F419 Anxiety disorder, unspecified: Secondary | ICD-10-CM | POA: Insufficient documentation

## 2017-10-01 DIAGNOSIS — D649 Anemia, unspecified: Secondary | ICD-10-CM | POA: Diagnosis not present

## 2017-10-01 DIAGNOSIS — K219 Gastro-esophageal reflux disease without esophagitis: Secondary | ICD-10-CM

## 2017-10-01 DIAGNOSIS — E785 Hyperlipidemia, unspecified: Secondary | ICD-10-CM | POA: Diagnosis not present

## 2017-10-01 DIAGNOSIS — D75839 Thrombocytosis, unspecified: Secondary | ICD-10-CM

## 2017-10-01 DIAGNOSIS — I10 Essential (primary) hypertension: Secondary | ICD-10-CM | POA: Insufficient documentation

## 2017-10-01 DIAGNOSIS — D473 Essential (hemorrhagic) thrombocythemia: Secondary | ICD-10-CM | POA: Diagnosis not present

## 2017-10-01 DIAGNOSIS — E119 Type 2 diabetes mellitus without complications: Secondary | ICD-10-CM | POA: Insufficient documentation

## 2017-10-01 LAB — CBC WITH DIFFERENTIAL/PLATELET
BASOS PCT: 1 %
Basophils Absolute: 0 10*3/uL (ref 0–0.1)
EOS ABS: 0.1 10*3/uL (ref 0–0.7)
EOS PCT: 1 %
HCT: 32.9 % — ABNORMAL LOW (ref 40.0–52.0)
HEMOGLOBIN: 11.1 g/dL — AB (ref 13.0–18.0)
LYMPHS ABS: 1.7 10*3/uL (ref 1.0–3.6)
Lymphocytes Relative: 26 %
MCH: 30.7 pg (ref 26.0–34.0)
MCHC: 33.7 g/dL (ref 32.0–36.0)
MCV: 91.2 fL (ref 80.0–100.0)
MONO ABS: 0.4 10*3/uL (ref 0.2–1.0)
Monocytes Relative: 6 %
Neutro Abs: 4.4 10*3/uL (ref 1.4–6.5)
Neutrophils Relative %: 66 %
Platelets: 518 10*3/uL — ABNORMAL HIGH (ref 150–440)
RBC: 3.61 MIL/uL — ABNORMAL LOW (ref 4.40–5.90)
RDW: 13.7 % (ref 11.5–14.5)
WBC: 6.6 10*3/uL (ref 3.8–10.6)

## 2017-10-01 NOTE — Progress Notes (Signed)
No new changes noted today 

## 2017-10-02 NOTE — Progress Notes (Signed)
Hematology/Oncology Consult note [facility]  Telephone:([phone] [phone]  Patient Care Team: [name], MD as PCP - General (Family Medicine) Vernon Prey, MD as Referring Physician (Ophthalmology) Meade Maw, MD as Consulting Physician (Neurosurgery)   Name of the patient: Garrett Yoder  [id number]  01-23-1946   Date of visit: 10/02/17  Diagnosis- 1. 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 Anemia likely due to chronic disease  Chief complaint/ Reason for visit-routine follow-up of anemia and thrombocytosis  Heme/Onc history: patient is a72 year old Caucasian male with a past medical history significant for diabetes, GERD, hypertension hyperlipidemia anxiety among other medical problems he has been referred to [place] for thrombocytosis. Of note patient has always had thrombocytosis even dating back to 2013 when his platelet count was 484. Since then his platelet count had been ranging between 400's to 500s. In July 2018 his platelet count was 522 and then gradually rose to 566 seconds 728 and then 748 recently. White count has always been normal. He is also had a long-standing normocytic anemia and his hemoglobin ranges around 11. Most recent CBC from 11/14/2016 showed white count of 6.5, H&H of 10.2/31.7 with an MCV of 95 and a platelet count of 748. Iron studies show normal ferritin of 112 and iron saturation of 23% and TIBC was normal at 300  Patient has had a 12 pound weight loss over the last 3 months but states that most of it happened over last 2 months after his disk surgery 8 weeks ago. He states that his appetite is coming back and he is feeling better. He has seen Dr. Ruthell Rummage our practice in the past for iron deficiency anemia  Results of blood work from 11/25/2016 were as follows: CBC showed white count of 5.6, H&H of 11/32.8 and a platelet count of 542. B12 was normal at 257 and folate was normal. Haptoglobin levels were  normal. Reticulocyte count was mildly low at 0.9%. Jak 2 And MPL Were Negative. ESR Was Normal. Peripheral Smear Review Showed Normal Morphology of WBCs RBCs and Platelets. CMP Was within Normal Limits. BCR Abl Testing Was Negative. Multiple Myeloma Panel Revealed No M Protein with Immunofixation Revealed Free Lambda Light Chains  Bone marrow biopsy on 03/17/2017 showed normocellular bone marrow with trilineage hematopoiesis.  Numerous lymphoid aggregates and normocytic normochromic anemia.  Bone marrow did not show any morphologic features of myeloproliferative or myelodysplastic neoplasm.  He did have numerous lymphoid aggregates.  Flow cytometry analysis showed a minor lymphoid population of B cells (22%) which were less than 5% of all cells and were coexpressing CD5 and CD20.  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  Cytogenetics and FISH studies were normal.  Plasma cells represented 2% of the cells and showed polyclonal staining pattern for kappa and lambda light chains and no evidence of plasma cell neoplasm in the bone marrow  Interval history-he feels well.  Denies any fatigue or unintentional weight loss.  His appetite is good.  Denies any specific complaints today  ECOG PS- 0 Pain scale- 0   Review of systems- Review of Systems  Constitutional: Negative for chills, fever, malaise/fatigue and weight loss.  HENT: Negative for congestion, ear discharge and nosebleeds.   Eyes: Negative for blurred vision.  Respiratory: Negative for cough, hemoptysis, sputum production, shortness of breath and wheezing.   Cardiovascular: Negative for chest pain, palpitations, orthopnea and claudication.  Gastrointestinal: Negative for abdominal pain, blood in stool, constipation, diarrhea, heartburn, melena, nausea and vomiting.  Genitourinary: Negative for dysuria, flank pain, frequency, hematuria and urgency.  Musculoskeletal: Negative for back pain, joint pain and  myalgias.  Skin: Negative for rash.  Neurological: Negative for dizziness, tingling, focal weakness, seizures, weakness and headaches.  Endo/Heme/Allergies: Does not bruise/bleed easily.  Psychiatric/Behavioral: Negative for depression and suicidal ideas. The patient does not have insomnia.       Allergies  Allergen Reactions  . Codeine Nausea Only    Other reaction(s): makes him sick to his stomach Other reaction(s): makes him sick to his stomach     Past Medical History:  Diagnosis Date  . Anemia 08/14/2015  . Anxiety   . Aortic atherosclerosis ([place]) 11/25/2016   Chest CT Oct 2018  . Aorto-iliac atherosclerosis ([place]) 11/25/2016   Abd/pelvic CT Oct 2018  . Coronary artery calcification seen on CAT scan 11/25/2016   Chest CT Oct 2018; refer to Dr. Fletcher Anon  . DDD (degenerative disc disease), cervical 08/21/2016  . Depression   . Diabetes mellitus without complication ([place])   . Facet arthropathy, cervical 08/21/2016  . GERD (gastroesophageal reflux disease)   . Heart murmur    As child  . Hyperlipidemia   . Hypertension   . Hypertrophy of prostate with urinary obstruction 11/25/2016   Pelvic CT October 2018  . Incidental lung nodule, > 29m and < 860m10/10/2016   5 mm nodule LLL, chest CT Oct 2018  . [place]     Past Surgical History:  Procedure Laterality Date  . ANTERIOR CERVICAL DECOMP/DISCECTOMY FUSION N/A 09/24/2016   Procedure: ANTERIOR CERVICAL DECOMPRESSION/DISCECTOMY FUSION 3 LEVELS-C4-7;  Surgeon: YaMeade MawMD;  Location: [facility] ORS;  Service: Neurosurgery;  Laterality: N/A;  . CATARACT EXTRACTION    . EYE SURGERY Right    Cataract Extraction with IOL  . RETINAL LASER PROCEDURE      Social History   Socioeconomic History  . Marital status: Married    Spouse name: Not on file  . Number of children: Not on file  . Years of education: Not on file  . Highest education level: Not on file  Occupational History  . Occupation: retired    [employer] laboratory technician. Financial resource strain: Not on file  . Food insecurity:    Worry: Not on file    Inability: Not on file  . Transportation needs:    Medical: Not on file    Non-medical: Not on file  Tobacco Use  . Smoking status: Former Smoker    Packs/day: 1.00    Years: 30.00    Pack years: 30.00    Types: Cigarettes    Last attempt to quit: 07/19/1999    Years since quitting: 18.2  . Smokeless tobacco: Never Used  Substance and Sexual Activity  . Alcohol use: No    Alcohol/week: 0.0 standard drinks  . Drug use: No  . Sexual activity: Yes    Partners: Female  Lifestyle  . Physical activity:    Days per week: Not on file    Minutes per session: Not on file  . Stress: Not on file  Relationships  . Social connections:    Talks on phone: Not on file    Gets together: Not on file    Attends religious service: Not on file    Active member of club or organization: Not on file    Attends meetings of clubs or organizations: Not on file    Relationship status: Not on file  . Intimate partner violence:  Fear of current or ex partner: Not on file    Emotionally abused: Not on file    Physically abused: Not on file    Forced sexual activity: Not on file  Other Topics Concern  . Not on file  Social History Narrative  . Not on file    Family History  Problem Relation Age of Onset  . Stroke Mother   . Diabetes Mellitus II Father   . Heart Problems Father   . Heart disease Father   . Diabetes Sister   . Cancer Maternal Grandfather   . Stroke Paternal Grandmother   . Prostate cancer Neg Hx   . Bladder Cancer Neg Hx   . Kidney cancer Neg Hx      Current Outpatient Medications:  .  aspirin EC 81 MG tablet, Take 81 mg by mouth daily., Disp: , Rfl:  .  atorvastatin (LIPITOR) 40 MG tablet, TAKE 1 TABLET BY MOUTH AT  BEDTIME FOR CHOLESTEROL, Disp: 90 tablet, Rfl: 1 .  bimatoprost (LUMIGAN) 0.01 % SOLN, Place 1 drop into both eyes at bedtime. , Disp: , Rfl:  .  Blood  Glucose Monitoring Suppl (ONE TOUCH ULTRA 2) w/Device KIT, OneTouch Ultra 2 device; Dx E11.9, insulin-dependent, LON 99 months; check fingerstick blood sugars four times a day, fluctuating, needs more frequent monitoring, Disp: 1 each, Rfl: 0 .  finasteride (PROSCAR) 5 MG tablet, Take 1 tablet (5 mg total) by mouth daily., Disp: 90 tablet, Rfl: 3 .  gabapentin (NEURONTIN) 300 MG capsule, Take 300 mg by mouth 3 (three) times daily., Disp: , Rfl: 1 .  glucose blood test strip, To match the OneTouch Ultra 2 device; Dx E11.9, insulin-dependent, LON 99 months; check fingerstick blood sugars four times a day, fluctuating, needs more frequent monitoring, Disp: 200 each, Rfl: 12 .  Insulin Detemir (LEVEMIR FLEXTOUCH) 100 UNIT/ML Pen, Inject 43 Units into the skin every morning., Disp: 45 mL, Rfl: 5 .  Insulin Pen Needle (PEN NEEDLES) 31G X 6 MM MISC, For use with pen device to inject insulin once a day; LON 99 months, E11.9, Z79.4, Disp: 100 each, Rfl: 3 .  irbesartan (AVAPRO) 150 MG tablet, TAKE 1 TABLET BY MOUTH  DAILY, Disp: 90 tablet, Rfl: 1 .  metFORMIN (GLUCOPHAGE) 1000 MG tablet, TAKE 1 TABLET BY MOUTH TWO  TIMES DAILY WITH A MEAL, Disp: 180 tablet, Rfl: 1 .  Multiple Vitamin (MULTI-VITAMINS) TABS, Take 1 tablet by mouth 2 (two) times daily. , Disp: , Rfl:  .  ONE TOUCH LANCETS MISC, Check fingerstick blood sugars four times a day; LON 99 months; type 2 diabetes on insulin E11.9, fluctuating sugars, Disp: 200 each, Rfl: 11 .  Probiotic Product (PROBIOTIC DAILY) CAPS, Take 1 capsule by mouth 2 (two) times daily. , Disp: , Rfl:  .  ranitidine (ZANTAC) 150 MG tablet, TAKE 1 TABLET BY MOUTH TWO  TIMES A DAY AS NEEDED FOR  HEARTBURN, Disp: 180 tablet, Rfl: 1 .  SIMBRINZA 1-0.2 % SUSP, Apply 1 drop to eye 3 (three) times daily. , Disp: , Rfl:  .  tamsulosin (FLOMAX) 0.4 MG CAPS capsule, TAKE 1 CAPSULE BY MOUTH  DAILY, Disp: 90 capsule, Rfl: 1 .  acetaminophen (TYLENOL) 500 MG tablet, Take 1,000 mg by mouth 2  (two) times daily. , Disp: , Rfl:   Physical exam:  Vitals:   10/01/17 1426  BP: 126/72  Pulse: 84  Resp: 18  Temp: (!) 97.4 F (36.3 C)  TempSrc: Tympanic  SpO2: 96%  Weight: 130 lb 12.8 oz (59.3 kg)  Height: 5' 8"  (1.727 m)   Physical Exam  Constitutional: He is oriented to person, place, and time.  Thin man in no acute distress  HENT:  Head: Normocephalic and atraumatic.  Eyes: Pupils are equal, round, and reactive to light. EOM are normal.  Neck: Normal range of motion.  Cardiovascular: Normal rate, regular rhythm and normal heart sounds.  Pulmonary/Chest: Effort normal and breath sounds normal.  Abdominal: Soft. Bowel sounds are normal. He exhibits no distension. There is no tenderness.  Neurological: He is alert and oriented to person, place, and time.  Skin: Skin is warm and dry.     CMP Latest Ref Rng & Units 07/17/2017  Glucose 65 - 139 mg/dL 151(H)  BUN 7 - 25 mg/dL 26(H)  Creatinine 0.70 - 1.18 mg/dL 1.23(H)  Sodium 135 - 146 mmol/L 140  Potassium 3.5 - 5.3 mmol/L 4.6  Chloride 98 - 110 mmol/L 104  CO2 20 - 32 mmol/L 26  Calcium 8.6 - 10.3 mg/dL 9.7  Total Protein 6.1 - 8.1 g/dL 6.8  Total Bilirubin 0.2 - 1.2 mg/dL 0.3  Alkaline Phos 38 - 126 U/L -  AST 10 - 35 U/L 13  ALT 9 - 46 U/L 15   CBC Latest Ref Rng & Units 10/01/2017  WBC 3.8 - 10.6 K/uL 6.6  Hemoglobin 13.0 - 18.0 g/dL 11.1(L)  Hematocrit 40.0 - 52.0 % 32.9(L)  Platelets 150 - 440 K/uL 518(H)      Assessment and plan- Patient is a 72 y.o. male with following issues:  1.  Thrombocytosis: Platelet counts have been in the 500s since July 2018.  Bone marrow biopsy did not reveal any primary myeloproliferative disorder.  No evidence of MDS was seen in the bone marrow.  Jak 2 CALR and MPL mutation testing was also negative.  His thrombocytosis is likely secondary although the etiology is definitively unclear.  Continue to monitor  2.  Normocytic anemia: Hemoglobin stable between 10-11 over the  last 2 years.  Anemia work-up has been unremarkable so far.  Continue to monitor   Repeat CBC with differential in 6 months and I will see him thereafter   Visit Diagnosis 1. Thrombocytosis ([place])   2. Normocytic anemia      Dr. Randa Evens, MD, MPH [facility] at [facility] [phone] 10/02/2017 1:30 PM

## 2017-10-09 ENCOUNTER — Encounter: Payer: Self-pay | Admitting: Family Medicine

## 2017-10-09 ENCOUNTER — Ambulatory Visit (INDEPENDENT_AMBULATORY_CARE_PROVIDER_SITE_OTHER): Payer: Medicare Other | Admitting: Family Medicine

## 2017-10-09 DIAGNOSIS — I700 Atherosclerosis of aorta: Secondary | ICD-10-CM

## 2017-10-09 DIAGNOSIS — D75839 Thrombocytosis, unspecified: Secondary | ICD-10-CM

## 2017-10-09 DIAGNOSIS — D649 Anemia, unspecified: Secondary | ICD-10-CM | POA: Diagnosis not present

## 2017-10-09 DIAGNOSIS — I10 Essential (primary) hypertension: Secondary | ICD-10-CM

## 2017-10-09 DIAGNOSIS — D473 Essential (hemorrhagic) thrombocythemia: Secondary | ICD-10-CM

## 2017-10-09 DIAGNOSIS — I70299 Other atherosclerosis of native arteries of extremities, unspecified extremity: Secondary | ICD-10-CM

## 2017-10-09 DIAGNOSIS — E1165 Type 2 diabetes mellitus with hyperglycemia: Secondary | ICD-10-CM

## 2017-10-09 DIAGNOSIS — I708 Atherosclerosis of other arteries: Secondary | ICD-10-CM

## 2017-10-09 NOTE — Patient Instructions (Addendum)
Return at the end of the first week of September for your A1c lab You do NOT need to be fasting; please go ahead and take your medicine and eat   Diabetes Mellitus and Nutrition When you have diabetes (diabetes mellitus), it is very important to have healthy eating habits because your blood sugar (glucose) levels are greatly affected by what you eat and drink. Eating healthy foods in the appropriate amounts, at about the same times every day, can help you:  Control your blood glucose.  Lower your risk of heart disease.  Improve your blood pressure.  Reach or maintain a healthy weight.  Every person with diabetes is different, and each person has different needs for a meal plan. Your health care provider may recommend that you work with a diet and nutrition specialist (dietitian) to make a meal plan that is best for you. Your meal plan may vary depending on factors such as:  The calories you need.  The medicines you take.  Your weight.  Your blood glucose, blood pressure, and cholesterol levels.  Your activity level.  Other health conditions you have, such as heart or kidney disease.  How do carbohydrates affect me? Carbohydrates affect your blood glucose level more than any other type of food. Eating carbohydrates naturally increases the amount of glucose in your blood. Carbohydrate counting is a method for keeping track of how many carbohydrates you eat. Counting carbohydrates is important to keep your blood glucose at a healthy level, especially if you use insulin or take certain oral diabetes medicines. It is important to know how many carbohydrates you can safely have in each meal. This is different for every person. Your dietitian can help you calculate how many carbohydrates you should have at each meal and for snack. Foods that contain carbohydrates include:  Bread, cereal, rice, pasta, and crackers.  Potatoes and corn.  Peas, beans, and lentils.  Milk and  yogurt.  Fruit and juice.  Desserts, such as cakes, cookies, ice cream, and candy.  How does alcohol affect me? Alcohol can cause a sudden decrease in blood glucose (hypoglycemia), especially if you use insulin or take certain oral diabetes medicines. Hypoglycemia can be a life-threatening condition. Symptoms of hypoglycemia (sleepiness, dizziness, and confusion) are similar to symptoms of having too much alcohol. If your health care provider says that alcohol is safe for you, follow these guidelines:  Limit alcohol intake to no more than 1 drink per day for nonpregnant women and 2 drinks per day for men. One drink equals 12 oz of beer, 5 oz of wine, or 1 oz of hard liquor.  Do not drink on an empty stomach.  Keep yourself hydrated with water, diet soda, or unsweetened iced tea.  Keep in mind that regular soda, juice, and other mixers may contain a lot of sugar and must be counted as carbohydrates.  What are tips for following this plan? Reading food labels  Start by checking the serving size on the label. The amount of calories, carbohydrates, fats, and other nutrients listed on the label are based on one serving of the food. Many foods contain more than one serving per package.  Check the total grams (g) of carbohydrates in one serving. You can calculate the number of servings of carbohydrates in one serving by dividing the total carbohydrates by 15. For example, if a food has 30 g of total carbohydrates, it would be equal to 2 servings of carbohydrates.  Check the number of grams (g) of  saturated and trans fats in one serving. Choose foods that have low or no amount of these fats.  Check the number of milligrams (mg) of sodium in one serving. Most people should limit total sodium intake to less than 2,300 mg per day.  Always check the nutrition information of foods labeled as "low-fat" or "nonfat". These foods may be higher in added sugar or refined carbohydrates and should be  avoided.  Talk to your dietitian to identify your daily goals for nutrients listed on the label. Shopping  Avoid buying canned, premade, or processed foods. These foods tend to be high in fat, sodium, and added sugar.  Shop around the outside edge of the grocery store. This includes fresh fruits and vegetables, bulk grains, fresh meats, and fresh dairy. Cooking  Use low-heat cooking methods, such as baking, instead of high-heat cooking methods like deep frying.  Cook using healthy oils, such as olive, canola, or sunflower oil.  Avoid cooking with butter, cream, or high-fat meats. Meal planning  Eat meals and snacks regularly, preferably at the same times every day. Avoid going long periods of time without eating.  Eat foods high in fiber, such as fresh fruits, vegetables, beans, and whole grains. Talk to your dietitian about how many servings of carbohydrates you can eat at each meal.  Eat 4-6 ounces of lean protein each day, such as lean meat, chicken, fish, eggs, or tofu. 1 ounce is equal to 1 ounce of meat, chicken, or fish, 1 egg, or 1/4 cup of tofu.  Eat some foods each day that contain healthy fats, such as avocado, nuts, seeds, and fish. Lifestyle   Check your blood glucose regularly.  Exercise at least 30 minutes 5 or more days each week, or as told by your health care provider.  Take medicines as told by your health care provider.  Do not use any products that contain nicotine or tobacco, such as cigarettes and e-cigarettes. If you need help quitting, ask your health care provider.  Work with a Social worker or diabetes educator to identify strategies to manage stress and any emotional and social challenges. What are some questions to ask my health care provider?  Do I need to meet with a diabetes educator?  Do I need to meet with a dietitian?  What number can I call if I have questions?  When are the best times to check my blood glucose? Where to find more  information:  American Diabetes Association: diabetes.org/food-and-fitness/food  Academy of Nutrition and Dietetics: PokerClues.dk  [employer] of Diabetes and Digestive and Kidney Diseases (NIH): ContactWire.be Summary  A healthy meal plan will help you control your blood glucose and maintain a healthy lifestyle.  Working with a diet and nutrition specialist (dietitian) can help you make a meal plan that is best for you.  Keep in mind that carbohydrates and alcohol have immediate effects on your blood glucose levels. It is important to count carbohydrates and to use alcohol carefully. This information is not intended to replace advice given to you by your health care provider. Make sure you discuss any questions you have with your health care provider. Document Released: 10/31/2004 Document Revised: 03/10/2016 Document Reviewed: 03/10/2016 Elsevier Interactive Patient Education  [employer].

## 2017-10-09 NOTE — Assessment & Plan Note (Signed)
Followed by heme.  

## 2017-10-09 NOTE — Assessment & Plan Note (Signed)
Controlled today 

## 2017-10-09 NOTE — Assessment & Plan Note (Signed)
Goal LDL less than 70 

## 2017-10-09 NOTE — Assessment & Plan Note (Signed)
Managed by hematologist 

## 2017-10-09 NOTE — Progress Notes (Signed)
BP 114/62   Pulse 89   Temp 98.6 F (37 C)   Ht 5\' 8"  (1.727 m)   Wt 128 lb 6.4 oz (58.2 kg)   SpO2 99%   BMI 19.52 kg/m    Subjective:    Patient ID: Garrett Yoder., male    DOB: 06/26/1945, 72 y.o.   MRN: [id number]  HPI: Garrett Bieler. is a 72 y.o. male  Chief Complaint  Patient presents with  . Follow-up    HPI Patient is here for f/u  Type 2 diabetes Refer to endocrinologist, Dr. Gabriel Carina; he was seen there on 01/02/2017 but he says she turned him loose He has had a few hypoglycemic attacks at night; two episodes; afraid that if his blood sugar is low, he'll go to sleep and have it drop further One reading series was 325 then 87, then 110 He had a 57, then ate and only went up to 119; then had orange juice and it went to 161 Foods discussed that may trigger rises in FSBS; dessert he thinks; drinks will run it up, like OJ; avoiding regular sodas; just sugar free or zero Coke Coffee with sugar-free creamer   Lab Results  Component Value Date   HGBA1C 8.0 (H) 07/17/2017   High cholesterol Lab Results  Component Value Date   CHOL 129 07/17/2017   HDL 55 07/17/2017   LDLCALC 59 07/17/2017   TRIG 69 07/17/2017   CHOLHDL 2.3 07/17/2017   Aortic atherosclerosis and aorto-iliac athero; LDL under 70; feet don't turn blue or purple  Hypertension; controlled today; they are doing well at home; highest 287 systolic in the last 2 weeks; lowest systolic was 681 since August 14th  Thrombocytosis; seen by hematologist; just saw her last week; platelet count down to 518; going to see him every six months; normal WBC, low H/H and high platelets; reviewed last five sets of CBCs; used to get iron infusions, none since 2016  Just got letter from GI, time for colonoscopy; thinks loose stools once a week might be related to diet; not enough fiber; wife only cooks once a month  Depression screen [facility] 2/9 10/09/2017 07/17/2017 02/24/2017 12/03/2016 11/25/2016  Decreased Interest  0 0 0 0 0  Down, Depressed, Hopeless 0 0 0 0 0  PHQ - 2 Score 0 0 0 0 0    Relevant past medical, surgical, family and social history reviewed Past Medical History:  Diagnosis Date  . Anemia 08/14/2015  . Anxiety   . Aortic atherosclerosis ([place]) 11/25/2016   Chest CT Oct 2018  . Aorto-iliac atherosclerosis ([place]) 11/25/2016   Abd/pelvic CT Oct 2018  . Coronary artery calcification seen on CAT scan 11/25/2016   Chest CT Oct 2018; refer to Dr. Fletcher Anon  . DDD (degenerative disc disease), cervical 08/21/2016  . Depression   . Diabetes mellitus without complication ([place])   . Facet arthropathy, cervical 08/21/2016  . GERD (gastroesophageal reflux disease)   . Heart murmur    As child  . Hyperlipidemia   . Hypertension   . Hypertrophy of prostate with urinary obstruction 11/25/2016   Pelvic CT October 2018  . Incidental lung nodule, > 46mm and < 33mm 11/25/2016   5 mm nodule LLL, chest CT Oct 2018  . Thrombocytosis ([place]) 08/14/2015   Past Surgical History:  Procedure Laterality Date  . ANTERIOR CERVICAL DECOMP/DISCECTOMY FUSION N/A 09/24/2016   Procedure: ANTERIOR CERVICAL DECOMPRESSION/DISCECTOMY FUSION 3 LEVELS-C4-7;  Surgeon: Meade Maw, MD;  Location: [facility]  ORS;  Service: Neurosurgery;  Laterality: N/A;  . CATARACT EXTRACTION    . EYE SURGERY Right    Cataract Extraction with IOL  . RETINAL LASER PROCEDURE     Family History  Problem Relation Age of Onset  . Stroke Mother   . Diabetes Mellitus II Father   . Heart Problems Father   . Heart disease Father   . Diabetes Sister   . Cancer Maternal Grandfather   . Stroke Paternal Grandmother   . Prostate cancer Neg Hx   . Bladder Cancer Neg Hx   . Kidney cancer Neg Hx    Social History   Tobacco Use  . Smoking status: Former Smoker    Packs/day: 1.00    Years: 30.00    Pack years: 30.00    Types: Cigarettes    Last attempt to quit: 07/19/1999    Years since quitting: 18.2  . Smokeless tobacco: Never Used  Substance Use  Topics  . Alcohol use: No    Alcohol/week: 0.0 standard drinks  . Drug use: No    Interim medical history since last visit reviewed. Allergies and medications reviewed  Review of Systems Per HPI unless specifically indicated above     Objective:    BP 114/62   Pulse 89   Temp 98.6 F (37 C)   Ht 5\' 8"  (1.727 m)   Wt 128 lb 6.4 oz (58.2 kg)   SpO2 99%   BMI 19.52 kg/m   Wt Readings from Last 3 Encounters:  10/09/17 128 lb 6.4 oz (58.2 kg)  10/01/17 130 lb 12.8 oz (59.3 kg)  07/17/17 130 lb 6.4 oz (59.1 kg)    Physical Exam  Constitutional: He appears well-developed and well-nourished. No distress.  HENT:  Head: Normocephalic and atraumatic.  Eyes: EOM are normal. No scleral icterus.  Neck: No thyromegaly present.  Cardiovascular: Normal rate and regular rhythm.  Pulmonary/Chest: Effort normal and breath sounds normal.  Abdominal: Soft. Bowel sounds are normal. He exhibits no distension.  Musculoskeletal: He exhibits no edema.  Neurological: Coordination normal.  Skin: Skin is warm and dry. No pallor.  Psychiatric: He has a normal mood and affect. His behavior is normal. Judgment and thought content normal.   Diabetic Foot Form - Detailed   Diabetic Foot Exam - detailed Diabetic Foot exam was performed with the following findings:  Yes 10/09/2017  1:52 PM  Visual Foot Exam completed.:  Yes  Pulse Foot Exam completed.:  Yes  Right Dorsalis Pedis:  Present Left Dorsalis Pedis:  Present  Sensory Foot Exam Completed.:  Yes Semmes-Weinstein Monofilament Test R Site 1-Great Toe:  Pos L Site 1-Great Toe:  Pos        Results for orders placed or performed in visit on 10/01/17  CBC with Differential/Platelet  Result Value Ref Range   WBC 6.6 3.8 - 10.6 K/uL   RBC 3.61 (L) 4.40 - 5.90 MIL/uL   Hemoglobin 11.1 (L) 13.0 - 18.0 g/dL   HCT 32.9 (L) 40.0 - 52.0 %   MCV 91.2 80.0 - 100.0 fL   MCH 30.7 26.0 - 34.0 pg   MCHC 33.7 32.0 - 36.0 g/dL   RDW 13.7 11.5 - 14.5 %     Platelets 518 (H) 150 - 440 K/uL   Neutrophils Relative % 66 %   Neutro Abs 4.4 1.4 - 6.5 K/uL   Lymphocytes Relative 26 %   Lymphs Abs 1.7 1.0 - 3.6 K/uL   Monocytes Relative 6 %  Monocytes Absolute 0.4 0.2 - 1.0 K/uL   Eosinophils Relative 1 %   Eosinophils Absolute 0.1 0 - 0.7 K/uL   Basophils Relative 1 %   Basophils Absolute 0.0 0 - 0.1 K/uL      Assessment & Plan:   Problem List Items Addressed This Visit      Cardiovascular and Mediastinum   Essential hypertension, benign    Controlled today      Aorto-iliac atherosclerosis (HCC)    Goal LDL less than 70      Aortic atherosclerosis (HCC)    Goal LDL less than 70        Endocrine   Uncontrolled type 2 diabetes mellitus ([place]) (Chronic)    Previously referred to endocrinologist, Dr. Gabriel Carina; he is comfortable seeing me here; discussed reducing long-acting insulin and adding short-acting insulin; he declined and wants to watch his diet instead and see next time; foot exam by MD today      Relevant Orders   Hemoglobin A1c     Hematopoietic and Hemostatic   Thrombocytosis ([place])    Managed by hematologist        Other   Anemia    Followed by heme          Follow up plan: Return in about 4 months (around 02/01/2018) for follow-up visit with Dr. Sanda Klein; do NOT come fasting.  An after-visit summary was printed and given to the patient at [place].  Please see the patient instructions which may contain other information and recommendations beyond what is mentioned above in the assessment and plan.  No orders of the defined types were placed in this encounter.   Orders Placed This Encounter  Procedures  . Hemoglobin A1c

## 2017-10-09 NOTE — Assessment & Plan Note (Addendum)
Previously referred to endocrinologist, Dr. Gabriel Carina; he is comfortable seeing me here; discussed reducing long-acting insulin and adding short-acting insulin; he declined and wants to watch his diet instead and see next time; foot exam by MD today

## 2017-10-29 ENCOUNTER — Other Ambulatory Visit: Payer: Self-pay | Admitting: Family Medicine

## 2017-10-29 NOTE — Telephone Encounter (Signed)
Please remind patient that his A1c was due to be drawn around September 4th I'll refill meds Thank you

## 2017-11-19 DIAGNOSIS — Z86010 Personal history of colonic polyps: Secondary | ICD-10-CM | POA: Diagnosis not present

## 2017-11-19 DIAGNOSIS — R197 Diarrhea, unspecified: Secondary | ICD-10-CM | POA: Diagnosis not present

## 2017-11-23 ENCOUNTER — Ambulatory Visit: Payer: Medicare Other

## 2017-11-27 ENCOUNTER — Ambulatory Visit: Admission: RE | Admit: 2017-11-27 | Payer: Medicare Other | Source: Ambulatory Visit

## 2017-12-06 ENCOUNTER — Emergency Department: Payer: Medicare Other

## 2017-12-06 ENCOUNTER — Other Ambulatory Visit: Payer: Self-pay

## 2017-12-06 ENCOUNTER — Inpatient Hospital Stay
Admission: EM | Admit: 2017-12-06 | Discharge: 2017-12-07 | DRG: 644 | Disposition: A | Discharge status: Home / Self Care | Payer: Medicare Other | Attending: Internal Medicine | Admitting: Internal Medicine

## 2017-12-06 DIAGNOSIS — E440 Moderate protein-calorie malnutrition: Secondary | ICD-10-CM

## 2017-12-06 DIAGNOSIS — K219 Gastro-esophageal reflux disease without esophagitis: Secondary | ICD-10-CM | POA: Diagnosis present

## 2017-12-06 DIAGNOSIS — Z7982 Long term (current) use of aspirin: Secondary | ICD-10-CM

## 2017-12-06 DIAGNOSIS — I708 Atherosclerosis of other arteries: Secondary | ICD-10-CM | POA: Diagnosis not present

## 2017-12-06 DIAGNOSIS — Z87891 Personal history of nicotine dependence: Secondary | ICD-10-CM

## 2017-12-06 DIAGNOSIS — R627 Adult failure to thrive: Secondary | ICD-10-CM | POA: Diagnosis present

## 2017-12-06 DIAGNOSIS — Z981 Arthrodesis status: Secondary | ICD-10-CM

## 2017-12-06 DIAGNOSIS — Z794 Long term (current) use of insulin: Secondary | ICD-10-CM | POA: Diagnosis not present

## 2017-12-06 DIAGNOSIS — E876 Hypokalemia: Secondary | ICD-10-CM | POA: Diagnosis present

## 2017-12-06 DIAGNOSIS — E785 Hyperlipidemia, unspecified: Secondary | ICD-10-CM | POA: Diagnosis present

## 2017-12-06 DIAGNOSIS — Z79899 Other long term (current) drug therapy: Secondary | ICD-10-CM

## 2017-12-06 DIAGNOSIS — E11649 Type 2 diabetes mellitus with hypoglycemia without coma: Secondary | ICD-10-CM | POA: Diagnosis present

## 2017-12-06 DIAGNOSIS — M5030 Other cervical disc degeneration, unspecified cervical region: Secondary | ICD-10-CM | POA: Diagnosis not present

## 2017-12-06 DIAGNOSIS — Z681 Body mass index (BMI) 19 or less, adult: Secondary | ICD-10-CM | POA: Diagnosis not present

## 2017-12-06 DIAGNOSIS — I10 Essential (primary) hypertension: Secondary | ICD-10-CM | POA: Diagnosis present

## 2017-12-06 DIAGNOSIS — R911 Solitary pulmonary nodule: Secondary | ICD-10-CM | POA: Diagnosis present

## 2017-12-06 DIAGNOSIS — Z885 Allergy status to narcotic agent status: Secondary | ICD-10-CM

## 2017-12-06 DIAGNOSIS — E162 Hypoglycemia, unspecified: Secondary | ICD-10-CM | POA: Diagnosis not present

## 2017-12-06 DIAGNOSIS — H409 Unspecified glaucoma: Secondary | ICD-10-CM | POA: Diagnosis present

## 2017-12-06 DIAGNOSIS — T383X5A Adverse effect of insulin and oral hypoglycemic [antidiabetic] drugs, initial encounter: Secondary | ICD-10-CM | POA: Diagnosis present

## 2017-12-06 DIAGNOSIS — I2510 Atherosclerotic heart disease of native coronary artery without angina pectoris: Secondary | ICD-10-CM | POA: Diagnosis not present

## 2017-12-06 DIAGNOSIS — E119 Type 2 diabetes mellitus without complications: Secondary | ICD-10-CM

## 2017-12-06 DIAGNOSIS — E160 Drug-induced hypoglycemia without coma: Principal | ICD-10-CM | POA: Diagnosis present

## 2017-12-06 DIAGNOSIS — I700 Atherosclerosis of aorta: Secondary | ICD-10-CM | POA: Diagnosis not present

## 2017-12-06 DIAGNOSIS — M1288 Other specific arthropathies, not elsewhere classified, other specified site: Secondary | ICD-10-CM | POA: Diagnosis present

## 2017-12-06 DIAGNOSIS — J9811 Atelectasis: Secondary | ICD-10-CM | POA: Diagnosis not present

## 2017-12-06 DIAGNOSIS — N400 Enlarged prostate without lower urinary tract symptoms: Secondary | ICD-10-CM | POA: Diagnosis present

## 2017-12-06 LAB — URINALYSIS, COMPLETE (UACMP) WITH MICROSCOPIC
Bacteria, UA: NONE SEEN
Bilirubin Urine: NEGATIVE
GLUCOSE, UA: 100 mg/dL — AB
HGB URINE DIPSTICK: NEGATIVE
LEUKOCYTES UA: NEGATIVE
Nitrite: NEGATIVE
PH: 8.5 — AB (ref 5.0–8.0)
Specific Gravity, Urine: 1.02 (ref 1.005–1.030)
Squamous Epithelial / LPF: NONE SEEN (ref 0–5)

## 2017-12-06 LAB — TROPONIN I

## 2017-12-06 LAB — CBC WITH DIFFERENTIAL/PLATELET
ABS IMMATURE GRANULOCYTES: 0.05 10*3/uL (ref 0.00–0.07)
Basophils Absolute: 0.1 10*3/uL (ref 0.0–0.1)
Basophils Relative: 1 %
Eosinophils Absolute: 0.1 10*3/uL (ref 0.0–0.5)
Eosinophils Relative: 1 %
HCT: 32.4 % — ABNORMAL LOW (ref 39.0–52.0)
Hemoglobin: 10.2 g/dL — ABNORMAL LOW (ref 13.0–17.0)
IMMATURE GRANULOCYTES: 0 %
Lymphocytes Relative: 18 %
Lymphs Abs: 2.4 10*3/uL (ref 0.7–4.0)
MCH: 30.1 pg (ref 26.0–34.0)
MCHC: 31.5 g/dL (ref 30.0–36.0)
MCV: 95.6 fL (ref 80.0–100.0)
Monocytes Absolute: 1 10*3/uL (ref 0.1–1.0)
Monocytes Relative: 8 %
NEUTROS ABS: 9.2 10*3/uL — AB (ref 1.7–7.7)
NEUTROS PCT: 72 %
PLATELETS: 489 10*3/uL — AB (ref 150–400)
RBC: 3.39 MIL/uL — ABNORMAL LOW (ref 4.22–5.81)
RDW: 12.7 % (ref 11.5–15.5)
WBC: 12.8 10*3/uL — ABNORMAL HIGH (ref 4.0–10.5)
nRBC: 0 % (ref 0.0–0.2)

## 2017-12-06 LAB — GLUCOSE, CAPILLARY
GLUCOSE-CAPILLARY: 75 mg/dL (ref 70–99)
GLUCOSE-CAPILLARY: 93 mg/dL (ref 70–99)
GLUCOSE-CAPILLARY: 82 mg/dL (ref 70–99)
GLUCOSE-CAPILLARY: 86 mg/dL (ref 70–99)
GLUCOSE-CAPILLARY: 91 mg/dL (ref 70–99)
GLUCOSE-CAPILLARY: 90 mg/dL (ref 70–99)
Glucose-Capillary: 126 mg/dL — ABNORMAL HIGH (ref 70–99)
Glucose-Capillary: 88 mg/dL (ref 70–99)
Glucose-Capillary: 90 mg/dL (ref 70–99)
Glucose-Capillary: 95 mg/dL (ref 70–99)

## 2017-12-06 LAB — TYPE AND SCREEN
ABO/RH(D): A POS
Antibody Screen: NEGATIVE

## 2017-12-06 LAB — COMPREHENSIVE METABOLIC PANEL
ALBUMIN: 3.9 g/dL (ref 3.5–5.0)
ALT: 15 U/L (ref 0–44)
AST: 17 U/L (ref 15–41)
Alkaline Phosphatase: 61 U/L (ref 38–126)
Anion gap: 8 (ref 5–15)
BUN: 18 mg/dL (ref 8–23)
CHLORIDE: 104 mmol/L (ref 98–111)
CO2: 29 mmol/L (ref 22–32)
Calcium: 8.7 mg/dL — ABNORMAL LOW (ref 8.9–10.3)
Creatinine, Ser: 1.15 mg/dL (ref 0.61–1.24)
GFR calc Af Amer: >60 mL/min (ref >60)
GLUCOSE: 98 mg/dL (ref 70–99)
POTASSIUM: 3.2 mmol/L — AB (ref 3.5–5.1)
SODIUM: 141 mmol/L (ref 135–145)
Total Bilirubin: 0.3 mg/dL (ref 0.3–1.2)
Total Protein: 6.1 g/dL — ABNORMAL LOW (ref 6.5–8.1)

## 2017-12-06 MED ORDER — POTASSIUM CHLORIDE CRYS ER 20 MEQ PO TBCR
40.0000 meq | EXTENDED_RELEASE_TABLET | Freq: Once | ORAL | Status: AC
  Administered 2017-12-06: 40 meq via ORAL
  Filled 2017-12-06: qty 2

## 2017-12-06 MED ORDER — ACETAMINOPHEN 650 MG RE SUPP: 650.0000 mg | Freq: Four times a day (QID) | RECTAL | Status: DC | PRN

## 2017-12-06 MED ORDER — ONDANSETRON HCL 4 MG PO TABS: 4.0000 mg | ORAL_TABLET | Freq: Four times a day (QID) | ORAL | Status: DC | PRN

## 2017-12-06 MED ORDER — PANTOPRAZOLE SODIUM 40 MG PO TBEC
40.0000 mg | DELAYED_RELEASE_TABLET | Freq: Every day | ORAL | Status: DC
  Administered 2017-12-07: 40 mg via ORAL
  Filled 2017-12-06 (×2): qty 1

## 2017-12-06 MED ORDER — ATORVASTATIN CALCIUM 20 MG PO TABS
40.0000 mg | ORAL_TABLET | Freq: Every day | ORAL | Status: DC
  Administered 2017-12-06: 40 mg via ORAL
  Filled 2017-12-06: qty 2

## 2017-12-06 MED ORDER — DOCUSATE SODIUM 100 MG PO CAPS
100.0000 mg | ORAL_CAPSULE | Freq: Two times a day (BID) | ORAL | Status: DC
  Administered 2017-12-06 – 2017-12-07 (×2): 100 mg via ORAL
  Filled 2017-12-06 (×2): qty 1

## 2017-12-06 MED ORDER — ONDANSETRON HCL 4 MG/2ML IJ SOLN: 4.0000 mg | Freq: Four times a day (QID) | INTRAMUSCULAR | Status: DC | PRN

## 2017-12-06 MED ORDER — DEXTROSE 50 % IV SOLN
25.0000 mL | Freq: Once | INTRAVENOUS | Status: AC
  Administered 2017-12-06: 25 mL via INTRAVENOUS

## 2017-12-06 MED ORDER — ENOXAPARIN SODIUM 40 MG/0.4ML ~~LOC~~ SOLN
40.0000 mg | SUBCUTANEOUS | Status: DC
  Administered 2017-12-06: 40 mg via SUBCUTANEOUS
  Filled 2017-12-06: qty 0.4

## 2017-12-06 MED ORDER — DEXTROSE 50 % IV SOLN
INTRAVENOUS | Status: AC
  Filled 2017-12-06: qty 50

## 2017-12-06 MED ORDER — DEXTROSE-NACL 5-0.45 % IV SOLN
INTRAVENOUS | Status: DC
  Administered 2017-12-06: 20:00:00 via INTRAVENOUS

## 2017-12-06 MED ORDER — ACETAMINOPHEN 325 MG PO TABS: 650.0000 mg | ORAL_TABLET | Freq: Four times a day (QID) | ORAL | Status: DC | PRN

## 2017-12-06 NOTE — Progress Notes (Signed)
Family Meeting Note  Advance Directive:yes  Today a meeting took place with the Patient.     The following clinical team members were present during this meeting:MD  The following were discussed:Patient's diagnosis: Severe hypoglycemia, insulin requiring diabetes metas, poor p.o. intake, failure to thrive, hypokalemia, other comorbidities essential hypertension, hyperlipidemia, GERD, treatment plan of care discussed in detail with the patient and his wife at bedside.  They both verbalized understanding of the plan.     Patient's progosis: Unable to determine and Goals for treatment: Full Code wife healthcare power of attorney  Additional follow-up to be provided: Intensivist, hospitalist, diabetic coordinator  Time spent during discussion:17 min  Nicholes Mango, MD

## 2017-12-06 NOTE — H&P (Signed)
Garrett Yoder: Garrett Yoder    MR#:  [id number]  DATE OF BIRTH:  [birth year]  DATE OF ADMISSION:  12/06/2017  PRIMARY CARE PHYSICIAN: [name], MD   REQUESTING/REFERRING PHYSICIAN: Dr. Corky Downs  CHIEF COMPLAINT:  Low blood sugar  HISTORY OF PRESENT ILLNESS:  Garrett Yoder  is a 72 y.o. male with a known history of insulin requiring diabetes, essential hypertension, hyperlipidemia, anxiety, chronic anemia is brought into the emergency department via EMS as patient was altered with a low blood sugar of 46.  Patient was given D10 bolus with rapid improvement in his mental status but consistently he is getting hypoglycemic and patient is placed on D5 half-normal saline drip in the emergency department and he is getting Accu-Cheks done every 30 minutes.  Patient denies any chest pain shortness of breath, fever or abdominal pain.  Denies any nausea or vomiting.  But reports decreased appetite and poor p.o. intake.  He is not eating well he is consistently taking his Levemir 43 units every morning and metformin.  Wife is at bedside.  PAST MEDICAL HISTORY:   Past Medical History:  Diagnosis Date  . Anemia 08/14/2015  . Anxiety   . Aortic atherosclerosis ([place]) 11/25/2016   Chest CT Oct 2018  . Aorto-iliac atherosclerosis ([place]) 11/25/2016   Abd/pelvic CT Oct 2018  . Coronary artery calcification seen on CAT scan 11/25/2016   Chest CT Oct 2018; refer to Dr. Fletcher Anon  . DDD (degenerative disc disease), cervical 08/21/2016  . Depression   . Diabetes mellitus without complication ([place])   . Facet arthropathy, cervical 08/21/2016  . GERD (gastroesophageal reflux disease)   . Heart murmur    As child  . Hyperlipidemia   . Hypertension   . Hypertrophy of prostate with urinary obstruction 11/25/2016   Pelvic CT October 2018  . Incidental lung nodule, > 84m and < 857m10/10/2016   5 mm nodule LLL, chest CT Oct 2018  . Thrombocytosis ([facility] 08/14/2015    PAST SURGICAL HISTOIRY:   Past Surgical History:  Procedure Laterality Date  . ANTERIOR CERVICAL DECOMP/DISCECTOMY FUSION N/A 09/24/2016   Procedure: ANTERIOR CERVICAL DECOMPRESSION/DISCECTOMY FUSION 3 LEVELS-C4-7;  Surgeon: YaMeade MawMD;  Location: [facility] ORS;  Service: Neurosurgery;  Laterality: N/A;  . CATARACT EXTRACTION    . EYE SURGERY Right    Cataract Extraction with IOL  . RETINAL LASER PROCEDURE      SOCIAL HISTORY:   Social History   Tobacco Use  . Smoking status: Former Smoker    Packs/day: 1.00    Years: 30.00    Pack years: 30.00    Types: Cigarettes    Last attempt to quit: 07/19/1999    Years since quitting: 18.3  . Smokeless tobacco: Never Used  Substance Use Topics  . Alcohol use: No    Alcohol/week: 0.0 standard drinks    FAMILY HISTORY:   Family History  Problem Relation Age of Onset  . Stroke Mother   . Diabetes Mellitus II Father   . Heart Problems Father   . Heart disease Father   . Diabetes Sister   . Cancer Maternal Grandfather   . Stroke Paternal Grandmother   . Prostate cancer Neg Hx   . Bladder Cancer Neg Hx   . Kidney cancer Neg Hx     DRUG ALLERGIES:   Allergies  Allergen Reactions  . Codeine Nausea Only    Other reaction(s): makes him sick to  his stomach Other reaction(s): makes him sick to his stomach    REVIEW OF SYSTEMS:  CONSTITUTIONAL: No fever, fatigue or weakness.  Poor p.o. intake EYES: No blurred or double vision.  EARS, NOSE, AND THROAT: No tinnitus or ear pain.  RESPIRATORY: No cough, shortness of breath, wheezing or hemoptysis.  CARDIOVASCULAR: No chest pain, orthopnea, edema.  GASTROINTESTINAL: No nausea, vomiting, diarrhea or abdominal pain.  GENITOURINARY: No dysuria, hematuria.  ENDOCRINE: No polyuria, nocturia,  HEMATOLOGY: No anemia, easy bruising or bleeding SKIN: No rash or lesion. MUSCULOSKELETAL: No joint pain or arthritis.   NEUROLOGIC: No tingling, numbness, weakness.   PSYCHIATRY: No anxiety or depression.   MEDICATIONS AT HOME:   Prior to Admission medications   Medication Sig Start Date End Date Taking? Authorizing Provider  acetaminophen (TYLENOL) 500 MG tablet Take 1,000 mg by mouth 2 (two) times daily.     [provider]  aspirin EC 81 MG tablet Take 81 mg by mouth daily.    [provider]  atorvastatin (LIPITOR) 40 MG tablet TAKE 1 TABLET BY MOUTH AT  BEDTIME FOR CHOLESTEROL 09/17/17   Lada, Satira Anis, MD  bimatoprost (LUMIGAN) 0.01 % SOLN Place 1 drop into both eyes at bedtime.     [provider]  Blood Glucose Monitoring Suppl (ONE TOUCH ULTRA 2) w/Device KIT OneTouch Ultra 2 device; Dx E11.9, insulin-dependent, LON 99 months; check fingerstick blood sugars four times a day, fluctuating, needs more frequent monitoring 11/27/16   Lada, Satira Anis, MD  finasteride (PROSCAR) 5 MG tablet Take 1 tablet (5 mg total) by mouth daily. 09/22/17   Stoioff, Ronda Fairly, MD  gabapentin (NEURONTIN) 300 MG capsule Take 300 mg by mouth 3 (three) times daily. 11/20/16   [provider]  glucose blood test strip To match the OneTouch Ultra 2 device; Dx E11.9, insulin-dependent, LON 99 months; check fingerstick blood sugars four times a day, fluctuating, needs more frequent monitoring 11/27/16   Lada, Satira Anis, MD  Insulin Detemir (LEVEMIR FLEXTOUCH) 100 UNIT/ML Pen Inject 43 Units into the skin every morning. 09/03/17   Lada, Satira Anis, MD  Insulin Pen Needle (PEN NEEDLES) 31G X 6 MM MISC For use with pen device to inject insulin once a day; LON 99 months, E11.9, Z79.4 12/12/16   Lada, Satira Anis, MD  irbesartan (AVAPRO) 150 MG tablet TAKE 1 TABLET BY MOUTH  DAILY 10/29/17   Lada, Satira Anis, MD  metFORMIN (GLUCOPHAGE) 1000 MG tablet TAKE 1 TABLET BY MOUTH TWO  TIMES DAILY WITH A MEAL 10/29/17   Lada, Satira Anis, MD  Multiple Vitamin (MULTI-VITAMINS) TABS Take 1 tablet by mouth 2 (two) times daily.     [provider]  ONE TOUCH LANCETS  MISC Check fingerstick blood sugars four times a day; LON 99 months; type 2 diabetes on insulin E11.9, fluctuating sugars 11/27/16   Lada, Satira Anis, MD  Probiotic Product (PROBIOTIC DAILY) CAPS Take 1 capsule by mouth 2 (two) times daily.     [provider]  ranitidine (ZANTAC) 150 MG tablet TAKE 1 TABLET BY MOUTH TWO  TIMES A DAY AS NEEDED FOR  HEARTBURN 09/17/17   Lada, Satira Anis, MD  SIMBRINZA 1-0.2 % SUSP Apply 1 drop to eye 3 (three) times daily.  04/18/16   [provider]  tamsulosin (FLOMAX) 0.4 MG CAPS capsule TAKE 1 CAPSULE BY MOUTH  DAILY 09/17/17   Lada, Satira Anis, MD      VITAL SIGNS:  Blood pressure 122/66, pulse 63,  resp. rate 12, height 5' 8"  (1.727 m), weight 58 kg, SpO2 100 %.  PHYSICAL EXAMINATION:  GENERAL:  72 y.o.-year-old patient lying in the bed with no acute distress.  EYES: Pupils equal, round, reactive to light and accommodation. No scleral icterus. Extraocular muscles intact.  HEENT: Head atraumatic, normocephalic. Oropharynx and nasopharynx clear.  NECK:  Supple, no jugular venous distention. No thyroid enlargement, no tenderness.  LUNGS: Normal breath sounds bilaterally, no wheezing, rales,rhonchi or crepitation. No use of accessory muscles of respiration.  CARDIOVASCULAR: S1, S2 normal. No murmurs, rubs, or gallops.  ABDOMEN: Soft, nontender, nondistended. Bowel sounds present. No organomegaly or mass.  EXTREMITIES: No pedal edema, cyanosis, or clubbing.  NEUROLOGIC: Cranial nerves II through XII are intact. Muscle strength 5/5 in all extremities. Sensation intact. Gait not checked.  PSYCHIATRIC: The patient is alert and oriented x 3.  SKIN: No obvious rash, lesion, or ulcer.   LABORATORY PANEL:   CBC Recent Labs  Lab 12/06/17 1907  WBC 12.8*  HGB 10.2*  HCT 32.4*  PLT 489*   ------------------------------------------------------------------------------------------------------------------  Chemistries  Recent Labs  Lab 12/06/17 1907   NA 141  K 3.2*  CL 104  CO2 29  GLUCOSE 98  BUN 18  CREATININE 1.15  CALCIUM 8.7*  AST 17  ALT 15  ALKPHOS 61  BILITOT 0.3   ------------------------------------------------------------------------------------------------------------------  Cardiac Enzymes Recent Labs  Lab 12/06/17 1907  TROPONINI <0.03   ------------------------------------------------------------------------------------------------------------------  RADIOLOGY:  Dg Chest Port 1 View  Result Date: 12/06/2017 CLINICAL DATA:  Hypoglycemia, weakness. EXAM: PORTABLE CHEST 1 VIEW COMPARISON:  Chest CT 11/20/2016 FINDINGS: Linear bibasilar atelectasis or scarring. Heart is borderline in size. No effusions or edema. No acute bony abnormality. IMPRESSION: Borderline heart size.  Bibasilar atelectasis or scarring. Electronically Signed   By: Rolm Baptise M.D.   On: 12/06/2017 19:32    EKG:   Orders placed or performed during the hospital encounter of 12/06/17  . EKG 12-Lead  . EKG 12-Lead    IMPRESSION AND PLAN:   #Severe hypoglycemia Admit to stepdown unit Patient is denying overdosing of his insulin Accu-Cheks every 30 minutes Continue D5 half-normal saline and titrate the drip Check hemoglobin A1c Patient does not seem to be septic Consult diabetic coordinator Diet and insulin education Patient is reporting poor p.o. intake dietary consult placed for calorie count and diabetic diet education   #Insulin requiring diabetes mellitus Given current severe hypoglycemia holding his Levemir and metformin  #Hypokalemia replete and recheck also check magnesium  #Failure to thrive with poor p.o. Intake Denies any depression Dietitian consult placed for diet education and calorie count  #GERD PPI  #Hyperlipidemia continue Lipitor  #Essential hypertension Blood pressure is soft hold home medications at this time  DVT prophylaxis with Lovenox subcu  All the records are reviewed and case discussed  with ED provider. Management plans discussed with the patient, family and they are in agreement.  CODE STATUS: fc   TOTAL TIME TAKING CARE OF THIS PATIENT: 43  minutes.   Note: This dictation was prepared with Dragon dictation along with smaller phrase technology. Any transcriptional errors that result from this process are unintentional.  Nicholes Mango M.D on 12/06/2017 at 9:16 PM  Between 7am to 6pm - Pager - [phone]  After 6pm go to [email or web address] - password [email or web address] [facility]  Office  [phone]  CC: Primary care physician; [name], MD

## 2017-12-06 NOTE — ED Provider Notes (Signed)
[facility] Emergency Department Provider Note   ____________________________________________    I have reviewed the triage vital signs and the nursing notes.   HISTORY  Chief Complaint Hypoglycemia     HPI Garrett Apollo. is a 72 y.o. male brought in for hypoglycemia.  Patient does not exactly remember what happened but reports he is feeling significantly better now.  He does report that he has type 2 diabetes and does use insulin.  He has not changed doses recently.  EMS reports that they found him altered with a glucose of 46.  He was given an amp of D10 with rapid improvement in mental status.  Currently patient states he feels well.  He denies chest pain.  No cough fevers or chills.  Mild nausea, no vomiting.  No dysuria.  No rashes.   Past Medical History:  Diagnosis Date  . Anemia 08/14/2015  . Anxiety   . Aortic atherosclerosis ([place]) 11/25/2016   Chest CT Oct 2018  . Aorto-iliac atherosclerosis ([place]) 11/25/2016   Abd/pelvic CT Oct 2018  . Coronary artery calcification seen on CAT scan 11/25/2016   Chest CT Oct 2018; refer to Dr. Fletcher Anon  . DDD (degenerative disc disease), cervical 08/21/2016  . Depression   . Diabetes mellitus without complication ([place])   . Facet arthropathy, cervical 08/21/2016  . GERD (gastroesophageal reflux disease)   . Heart murmur    As child  . Hyperlipidemia   . Hypertension   . Hypertrophy of prostate with urinary obstruction 11/25/2016   Pelvic CT October 2018  . Incidental lung nodule, > 12m and < 88m10/10/2016   5 mm nodule LLL, chest CT Oct 2018  . Thrombocytosis ([facility] [place]/27/2017    Patient Active Problem List   Diagnosis Date Noted  . Uncontrolled type 2 diabetes mellitus ([facility]10/12/2016  . Aortic atherosclerosis ([facility]/10/2016  . Coronary artery calcification seen on CAT scan 11/25/2016  . Incidental lung nodule, > 2m69mnd < 8mm60m/10/2016  . Aorto-iliac atherosclerosis ([facility])[place]/10/2016  .  Hypertrophy of prostate with urinary obstruction 11/25/2016  . Abnormal computed tomography of bladder 11/25/2016  . Cervical myelopathy ([facility])[place]/09/2016  . Neck pain, chronic 08/21/2016  . DDD (degenerative disc disease), cervical 08/21/2016  . Facet arthropathy, cervical 08/21/2016  . De Quervain's tenosynovitis 08/21/2016  . Vertigo 10/06/2015  . Acid reflux 09/09/2015  . Colon cancer screening 08/14/2015  . Essential hypertension, benign 08/14/2015  . Hyperlipidemia 08/14/2015  . Medication monitoring encounter 08/14/2015  . Anemia 08/14/2015  . Thrombocytosis ([facility])[place]/27/2017    Past Surgical History:  Procedure Laterality Date  . ANTERIOR CERVICAL DECOMP/DISCECTOMY FUSION N/A 09/24/2016   Procedure: ANTERIOR CERVICAL DECOMPRESSION/DISCECTOMY FUSION 3 LEVELS-C4-7;  Surgeon: YarbMeade Maw;  Location: [facility] ORS;  Service: Neurosurgery;  Laterality: N/A;  . CATARACT EXTRACTION    . EYE SURGERY Right    Cataract Extraction with IOL  . RETINAL LASER PROCEDURE      Prior to Admission medications   Medication Sig Start Date End Date Taking? Authorizing Provider  acetaminophen (TYLENOL) 500 MG tablet Take 1,000 mg by mouth 2 (two) times daily.     [provider]  aspirin EC 81 MG tablet Take 81 mg by mouth daily.    [provider]  atorvastatin (LIPITOR) 40 MG tablet TAKE 1 TABLET BY MOUTH AT  BEDTIME FOR CHOLESTEROL 09/17/17   Lada, MeliSatira Anis  bimatoprost (LUMIGAN) 0.01 % SOLN Place 1 drop into both eyes  at bedtime.     [provider]  Blood Glucose Monitoring Suppl (ONE TOUCH ULTRA 2) w/Device KIT OneTouch Ultra 2 device; Dx E11.9, insulin-dependent, LON 99 months; check fingerstick blood sugars four times a day, fluctuating, needs more frequent monitoring 11/27/16   Lada, Satira Anis, MD  finasteride (PROSCAR) 5 MG tablet Take 1 tablet (5 mg total) by mouth daily. 09/22/17   Stoioff, Ronda Fairly, MD  gabapentin (NEURONTIN) 300 MG capsule Take 300 mg by  mouth 3 (three) times daily. 11/20/16   [provider]  glucose blood test strip To match the OneTouch Ultra 2 device; Dx E11.9, insulin-dependent, LON 99 months; check fingerstick blood sugars four times a day, fluctuating, needs more frequent monitoring 11/27/16   Lada, Satira Anis, MD  Insulin Detemir (LEVEMIR FLEXTOUCH) 100 UNIT/ML Pen Inject 43 Units into the skin every morning. 09/03/17   Lada, Satira Anis, MD  Insulin Pen Needle (PEN NEEDLES) 31G X 6 MM MISC For use with pen device to inject insulin once a day; LON 99 months, E11.9, Z79.4 12/12/16   Lada, Satira Anis, MD  irbesartan (AVAPRO) 150 MG tablet TAKE 1 TABLET BY MOUTH  DAILY 10/29/17   Lada, Satira Anis, MD  metFORMIN (GLUCOPHAGE) 1000 MG tablet TAKE 1 TABLET BY MOUTH TWO  TIMES DAILY WITH A MEAL 10/29/17   Lada, Satira Anis, MD  Multiple Vitamin (MULTI-VITAMINS) TABS Take 1 tablet by mouth 2 (two) times daily.     [provider]  ONE TOUCH LANCETS MISC Check fingerstick blood sugars four times a day; LON 99 months; type 2 diabetes on insulin E11.9, fluctuating sugars 11/27/16   Lada, Satira Anis, MD  Probiotic Product (PROBIOTIC DAILY) CAPS Take 1 capsule by mouth 2 (two) times daily.     [provider]  ranitidine (ZANTAC) 150 MG tablet TAKE 1 TABLET BY MOUTH TWO  TIMES A DAY AS NEEDED FOR  HEARTBURN 09/17/17   Lada, Satira Anis, MD  SIMBRINZA 1-0.2 % SUSP Apply 1 drop to eye 3 (three) times daily.  04/18/16   [provider]  tamsulosin (FLOMAX) 0.4 MG CAPS capsule TAKE 1 CAPSULE BY MOUTH  DAILY 09/17/17   Lada, Satira Anis, MD     Allergies Codeine  Family History  Problem Relation Age of Onset  . Stroke Mother   . Diabetes Mellitus II Father   . Heart Problems Father   . Heart disease Father   . Diabetes Sister   . Cancer Maternal Grandfather   . Stroke Paternal Grandmother   . Prostate cancer Neg Hx   . Bladder Cancer Neg Hx   . Kidney cancer Neg Hx     Social History Social History   Tobacco Use    . Smoking status: Former Smoker    Packs/day: 1.00    Years: 30.00    Pack years: 30.00    Types: Cigarettes    Last attempt to quit: 07/19/1999    Years since quitting: 18.3  . Smokeless tobacco: Never Used  Substance Use Topics  . Alcohol use: No    Alcohol/week: 0.0 standard drinks  . Drug use: No    Review of Systems  Constitutional: No fever/chills Eyes: No visual changes.  ENT: No sore throat. Cardiovascular: Denies chest pain. Respiratory: Denies shortness of breath. Gastrointestinal: As above Genitourinary: Negative for dysuria. Musculoskeletal: Negative for back pain. Skin: Negative for rash. Neurological: Negative for headaches    ____________________________________________   PHYSICAL EXAM:  VITAL SIGNS: ED Triage Vitals  Enc Vitals Group     BP 12/06/17 1900 113/61     Pulse Rate 12/06/17 1900 64     Resp 12/06/17 1900 20     Temp --      Temp src --      SpO2 12/06/17 1900 100 %     Weight 12/06/17 1857 58 kg (127 lb 13.9 oz)     Height 12/06/17 1857 1.727 m (5' 8" )     Head Circumference --      Peak Flow --      Pain Score 12/06/17 1856 0     Pain Loc --      Pain Edu? --      Excl. in [place]? --     Constitutional: Alert and oriented. No acute distress. Eyes: Conjunctivae are normal.  Head: Atraumatic. Nose: No congestion/rhinnorhea. Mouth/Throat: Mucous membranes are moist.    Cardiovascular: Normal rate, regular rhythm. Grossly normal heart sounds.  Good peripheral circulation. Respiratory: Normal respiratory effort.  No retractions. Lungs CTAB. Gastrointestinal: Soft and nontender. No distention.   Musculoskeletal: .  Warm and well perfused Neurologic:  Normal speech and language. No gross focal neurologic deficits are appreciated.  Skin:  Skin is warm, dry and intact. No rash noted. Psychiatric: Mood and affect are normal. Speech and behavior are normal.  ____________________________________________   LABS (all labs ordered are  listed, but only abnormal results are displayed)  Labs Reviewed  COMPREHENSIVE METABOLIC PANEL - Abnormal; Notable for the following components:      Result Value   Potassium 3.2 (*)    Calcium 8.7 (*)    Total Protein 6.1 (*)    All other components within normal limits  CBC WITH DIFFERENTIAL/PLATELET - Abnormal; Notable for the following components:   WBC 12.8 (*)    RBC 3.39 (*)    Hemoglobin 10.2 (*)    HCT 32.4 (*)    Platelets 489 (*)    Neutro Abs 9.2 (*)    All other components within normal limits  GLUCOSE, CAPILLARY - Abnormal; Notable for the following components:   Glucose-Capillary 126 (*)    All other components within normal limits  GLUCOSE, CAPILLARY  TROPONIN I  GLUCOSE, CAPILLARY  GLUCOSE, CAPILLARY  GLUCOSE, CAPILLARY  URINALYSIS, COMPLETE (UACMP) WITH MICROSCOPIC  TYPE AND SCREEN   ____________________________________________  EKG  ED ECG REPORT I, Lavonia Drafts, the attending physician, personally viewed and interpreted this ECG.  Date: 12/06/2017  Rhythm: normal sinus rhythm QRS Axis: normal Intervals: normal ST/T Wave abnormalities: normal Narrative Interpretation: no evidence of acute ischemia  ____________________________________________  RADIOLOGY  Chest x-ray ____________________________________________   PROCEDURES  Procedure(s) performed: No  Procedures   Critical Care performed: yes  CRITICAL CARE Performed by: Lavonia Drafts   Total critical care time: 30 minutes  Critical care time was exclusive of separately billable procedures and treating other patients.  Critical care was necessary to treat or prevent imminent or life-threatening deterioration.  Critical care was time spent personally by me on the following activities: development of treatment plan with patient and/or surrogate as well as nursing, discussions with consultants, evaluation of patient's response to treatment, examination of patient, obtaining  history from patient or surrogate, ordering and performing treatments and interventions, ordering and review of laboratory studies, ordering and review of radiographic studies, pulse oximetry and re-evaluation of patient's condition.  ____________________________________________   INITIAL IMPRESSION / ASSESSMENT AND PLAN / ED COURSE  Pertinent labs & imaging results that were available during my  care of the patient were reviewed by me and considered in my medical decision making (see chart for details).  Patient presents after hyper glycemic episode.  Given amp of D10 by EMS, glucose upon arrival was reported to be above 200.  However patient's glucose rapidly dropped in the emergency department to 75.  Half an amp of D50 given.  We will start the patient on D5 half-normal saline drip while we investigate the cause of his continued hypoglycemia.  Lab work is overall unremarkable, glucose is stabilized on D5 drip.  Will discuss with hospitalist for admission for continued evaluation of hypoglycemia    ____________________________________________   FINAL CLINICAL IMPRESSION(S) / ED DIAGNOSES  Final diagnoses:  Hypoglycemia        Note:  This document was prepared using Dragon voice recognition software and may include unintentional dictation errors.    Lavonia Drafts, MD 12/06/17 2012

## 2017-12-06 NOTE — Consult Note (Addendum)
PULMONARY / CRITICAL CARE MEDICINE  Name: Garrett Yoder. MRN: [id number] DOB: [birth year]    LOS: 1  Referring Provider: Dr. Margaretmary Eddy Reason for Referral: Hypoglycemia Brief patient description: 73 year old male admitted with hypoglycemia secondary to insulin therapy with poor oral intake  HPI:  72 year old male with type 2 diabetes on insulin presenting with hypoglycemia and acute change in mental status.  Patient states that his appetite has been fluctuating.  Today he became altered and EMS was called.  When EMS arrived, patient's blood sugar was noted to be 46 mg/dL's.  He was started on D10 with improvement in his mental status.  His mentation is now at baseline and his last blood sugar was 90 mg/dL. He denies chest pain, palpitations, nausea, vomiting, dizziness, and headache.  Past Medical History:  Diagnosis Date  . Anemia 08/14/2015  . Anxiety   . Aortic atherosclerosis ([place]) 11/25/2016   Chest CT Oct 2018  . Aorto-iliac atherosclerosis ([place]) 11/25/2016   Abd/pelvic CT Oct 2018  . Coronary artery calcification seen on CAT scan 11/25/2016   Chest CT Oct 2018; refer to Dr. Fletcher Anon  . DDD (degenerative disc disease), cervical 08/21/2016  . Depression   . Diabetes mellitus without complication ([place])   . Facet arthropathy, cervical 08/21/2016  . GERD (gastroesophageal reflux disease)   . Heart murmur    As child  . Hyperlipidemia   . Hypertension   . Hypertrophy of prostate with urinary obstruction 11/25/2016   Pelvic CT October 2018  . Incidental lung nodule, > 30mm and < 50mm 11/25/2016   5 mm nodule LLL, chest CT Oct 2018  . Thrombocytosis ([place]) 08/14/2015   Past Surgical History:  Procedure Laterality Date  . ANTERIOR CERVICAL DECOMP/DISCECTOMY FUSION N/A 09/24/2016   Procedure: ANTERIOR CERVICAL DECOMPRESSION/DISCECTOMY FUSION 3 LEVELS-C4-7;  Surgeon: Meade Maw, MD;  Location: [facility] ORS;  Service: Neurosurgery;  Laterality: N/A;  . CATARACT EXTRACTION    . EYE SURGERY  Right    Cataract Extraction with IOL  . RETINAL LASER PROCEDURE     Prior to Admission medications   Medication Sig Start Date End Date Taking? Authorizing Provider  amLODipine (NORVASC) 5 MG tablet Take 5 mg by mouth daily.   Yes [provider]  clopidogrel (PLAVIX) 75 MG tablet Take 75 mg by mouth daily.   Yes [provider]  donepezil (ARICEPT) 5 MG tablet Take 1 tablet (5 mg total) by mouth at bedtime. 10/12/17 11/21/17 Yes Sowles, Drue Stager, MD  empagliflozin (JARDIANCE) 25 MG TABS tablet Take 25 mg by mouth daily.   Yes [provider]  glycopyrrolate (ROBINUL) 1 MG tablet Take 1 mg by mouth 2 (two) times daily.   Yes [provider]  insulin aspart (NOVOLOG FLEXPEN) 100 UNIT/ML FlexPen Inject 12 Units into the skin 2 (two) times daily.   Yes [provider]  insulin aspart (NOVOLOG) 100 UNIT/ML FlexPen Inject 18 Units into the skin daily. At 1700   Yes [provider]  Insulin Degludec-Liraglutide (XULTOPHY) 100-3.6 UNIT-MG/ML SOPN Inject 50 Units into the skin daily.   Yes [provider]  levETIRAcetam (KEPPRA) 500 MG tablet Take 500 mg by mouth 2 (two) times daily.   Yes [provider]  lipase/protease/amylase (CREON) 12000 units CPEP capsule Take 6,000 Units by mouth 3 (three) times daily before meals.   Yes [provider]  lipase/protease/amylase (CREON) 12000 units CPEP capsule Take 3,000 Units by mouth at bedtime. With snack   Yes [provider]  lisinopril (  PRINIVIL,ZESTRIL) 5 MG tablet Take 5 mg by mouth daily.   Yes [provider]  metoprolol succinate (TOPROL-XL) 25 MG 24 hr tablet Take 1 tablet (25 mg total) by mouth daily. 10/12/17  Yes Sowles, Drue Stager, MD  rosuvastatin (CRESTOR) 40 MG tablet Take 1 tablet (40 mg total) by mouth daily. 10/12/17 11/21/17 Yes Steele Sizer, MD  aspirin EC 81 MG tablet Take 81 mg by mouth daily.    [provider]  famotidine (PEPCID) 20  MG tablet Take 1 tablet (20 mg total) by mouth 2 (two) times daily. 10/12/17 11/11/17  Steele Sizer, MD  gabapentin (NEURONTIN) 300 MG capsule Take 1 capsule (300 mg total) by mouth 2 (two) times daily. 10/12/17 11/11/17  Steele Sizer, MD  insulin glargine (LANTUS) 100 UNIT/ML injection Inject 0.1 mLs (10 Units total) into the skin daily. 10/12/17 11/11/17  Steele Sizer, MD  lacosamide 100 MG TABS Take 1 tablet (100 mg total) by mouth 2 (two) times daily. Patient not taking: Reported on 11/21/2017 02/27/17   Fritzi Mandes, MD  promethazine (PHENERGAN) 12.5 MG tablet Take 1 tablet (12.5 mg total) by mouth every 6 (six) hours as needed for nausea or vomiting. Patient not taking: Reported on 11/21/2017 12/16/16   Stark Klein, MD  sertraline (ZOLOFT) 25 MG tablet Take 1 tablet (25 mg total) by mouth daily. Patient not taking: Reported on 11/21/2017 10/12/17   Steele Sizer, MD   Allergies Allergies  Allergen Reactions  . Codeine Nausea Only    Other reaction(s): makes him sick to his stomach Other reaction(s): makes him sick to his stomach    Family History Family History  Problem Relation Age of Onset  . Stroke Mother   . Diabetes Mellitus II Father   . Heart Problems Father   . Heart disease Father   . Diabetes Sister   . Cancer Maternal Grandfather   . Stroke Paternal Grandmother   . Prostate cancer Neg Hx   . Bladder Cancer Neg Hx   . Kidney cancer Neg Hx    Social History  reports that he quit smoking about 18 years ago. His smoking use included cigarettes. He has a 30.00 pack-year smoking history. He has never used smokeless tobacco. He reports that he does not drink alcohol or use drugs.  Review Of Systems:   Constitutional: Negative for fever and chills.  HENT: Negative for congestion and rhinorrhea.  Eyes: Negative for redness and visual disturbance.  Respiratory: Negative for shortness of breath and wheezing.  Cardiovascular: Negative for chest pain and palpitations.   Gastrointestinal: Negative  for nausea , vomiting and abdominal pain and  Loose stools but reports anorexia Genitourinary: Negative for dysuria and urgency.  Endocrine: Denies polyuria, polyphagia and heat intolerance Musculoskeletal: Negative for myalgias and arthralgias.  Skin: Negative for pallor and wound.  Neurological: Negative for dizziness and headaches   VITAL SIGNS: BP (!) 135/59   Pulse 65   Temp 99 F (37.2 C) (Oral)   Resp 19   Ht 5\' 8"  (1.727 m)   Wt 58.7 kg   SpO2 97%   BMI 19.68 kg/m   HEMODYNAMICS:    VENTILATOR SETTINGS:    INTAKE / OUTPUT: No intake/output data recorded.  PHYSICAL EXAMINATION: General: No acute distress HEENT: PERRLA Neuro: Alert and oriented x3, cranial nerves intact Cardiovascular: Apical pulse regular, S1-S2, no murmur regurg or gallop, no edema, +2 pulses Lungs: Clear to auscultation bilaterally Abdomen: Benign Musculoskeletal: Positive range of motion, no deformities Skin: Warm and dry  LABS:  BMET Recent Labs  Lab 12/06/17 1907  NA 141  K 3.2*  CL 104  CO2 29  BUN 18  CREATININE 1.15  GLUCOSE 98    Electrolytes Recent Labs  Lab 12/06/17 1907  CALCIUM 8.7*    CBC Recent Labs  Lab 12/06/17 1907 12/07/17 0532  WBC 12.8* 6.6  HGB 10.2* 10.7*  HCT 32.4* 32.5*  PLT 489* 485*    Coag's No results for input(s): APTT, INR in the last 168 hours.  Sepsis Markers No results for input(s): LATICACIDVEN, PROCALCITON, O2SATVEN in the last 168 hours.  ABG No results for input(s): PHART, PCO2ART, PO2ART in the last 168 hours.  Liver Enzymes Recent Labs  Lab 12/06/17 1907  AST 17  ALT 15  ALKPHOS 61  BILITOT 0.3  ALBUMIN 3.9    Cardiac Enzymes Recent Labs  Lab 12/06/17 1907  TROPONINI <0.03    Glucose Recent Labs  Lab 12/07/17 0103 12/07/17 0210 12/07/17 0308 12/07/17 0354 12/07/17 0501 12/07/17 0555  GLUCAP 88 104* 75 96 101* 102*    Imaging Dg Chest Port 1 View  Result Date:  12/06/2017 CLINICAL DATA:  Hypoglycemia, weakness. EXAM: PORTABLE CHEST 1 VIEW COMPARISON:  Chest CT 11/20/2016 FINDINGS: Linear bibasilar atelectasis or scarring. Heart is borderline in size. No effusions or edema. No acute bony abnormality. IMPRESSION: Borderline heart size.  Bibasilar atelectasis or scarring. Electronically Signed   By: Rolm Baptise M.D.   On: 12/06/2017 19:32    SIGNIFICANT EVENTS: 12/06/2017: Admitted  LINES/TUBES: Peripheral IVs   ASSESSMENT Hypoglycemia secondary to insulin therapy with poor oral intake Type 2 diabetes Hypokalemia  PLAN DC insulin Consult to diabetes coordinator D10 half-normal saline at 75 mL's an hour Monitor and replace electrolytes Resume all home medications except antidiabetic's  Best Practice: Code Status: Full code Diet: Carb modified GI prophylaxis: Not indicated VTE prophylaxis: Lovenox  FAMILY  - Updates: No family at bedside.  Patient updated on current treatment plan  [facility] ANP-BC Pulmonary and Critical Care Medicine [facility] Pager [phone] or [phone]  NB: This document was prepared using Dragon voice recognition software and may include unintentional dictation errors.    12/07/2017, 6:26 AM   I agree with the documented

## 2017-12-06 NOTE — ED Triage Notes (Signed)
Pt comes from home via [facility] after his blood sugar dropped. Upon arrival it was 40. 247ml of D10 was given around 630 and his second check was 296. Third check was 299. Pt vomitted once. C/o nausea and dizziness. 4mg  zofran given.

## 2017-12-07 DIAGNOSIS — E440 Moderate protein-calorie malnutrition: Secondary | ICD-10-CM

## 2017-12-07 LAB — COMPREHENSIVE METABOLIC PANEL
ALK PHOS: 64 U/L (ref 38–126)
ALT: 15 U/L (ref 0–44)
ANION GAP: 7 (ref 5–15)
AST: 16 U/L (ref 15–41)
Albumin: 3.9 g/dL (ref 3.5–5.0)
BUN: 14 mg/dL (ref 8–23)
CALCIUM: 8.7 mg/dL — AB (ref 8.9–10.3)
CO2: 26 mmol/L (ref 22–32)
Chloride: 109 mmol/L (ref 98–111)
Creatinine, Ser: 1.09 mg/dL (ref 0.61–1.24)
GFR calc Af Amer: >60 mL/min (ref >60)
Glucose, Bld: 109 mg/dL — ABNORMAL HIGH (ref 70–99)
Potassium: 4 mmol/L (ref 3.5–5.1)
SODIUM: 142 mmol/L (ref 135–145)
TOTAL PROTEIN: 6.3 g/dL — AB (ref 6.5–8.1)
Total Bilirubin: 0.5 mg/dL (ref 0.3–1.2)

## 2017-12-07 LAB — CBC
HCT: 32.5 % — ABNORMAL LOW (ref 39.0–52.0)
HEMOGLOBIN: 10.7 g/dL — AB (ref 13.0–17.0)
MCH: 30.5 pg (ref 26.0–34.0)
MCHC: 32.9 g/dL (ref 30.0–36.0)
MCV: 92.6 fL (ref 80.0–100.0)
Platelets: 485 10*3/uL — ABNORMAL HIGH (ref 150–400)
RBC: 3.51 MIL/uL — ABNORMAL LOW (ref 4.22–5.81)
RDW: 12.7 % (ref 11.5–15.5)
WBC: 6.6 10*3/uL (ref 4.0–10.5)
nRBC: 0 % (ref 0.0–0.2)

## 2017-12-07 LAB — TSH: TSH: 1.404 u[IU]/mL (ref 0.350–4.500)

## 2017-12-07 LAB — GLUCOSE, CAPILLARY
GLUCOSE-CAPILLARY: 101 mg/dL — AB (ref 70–99)
GLUCOSE-CAPILLARY: 104 mg/dL — AB (ref 70–99)
GLUCOSE-CAPILLARY: 263 mg/dL — AB (ref 70–99)
GLUCOSE-CAPILLARY: 264 mg/dL — AB (ref 70–99)
GLUCOSE-CAPILLARY: 88 mg/dL (ref 70–99)
GLUCOSE-CAPILLARY: 91 mg/dL (ref 70–99)
Glucose-Capillary: 75 mg/dL (ref 70–99)
Glucose-Capillary: 96 mg/dL (ref 70–99)
Glucose-Capillary: 102 mg/dL — ABNORMAL HIGH (ref 70–99)
Glucose-Capillary: 132 mg/dL — ABNORMAL HIGH (ref 70–99)
Glucose-Capillary: 150 mg/dL — ABNORMAL HIGH (ref 70–99)
Glucose-Capillary: 245 mg/dL — ABNORMAL HIGH (ref 70–99)

## 2017-12-07 LAB — MAGNESIUM: Magnesium: 1.8 mg/dL (ref 1.7–2.4)

## 2017-12-07 LAB — MRSA PCR SCREENING: MRSA BY PCR: NEGATIVE

## 2017-12-07 MED ORDER — INSULIN DETEMIR 100 UNIT/ML ~~LOC~~ SOLN
30.0000 [IU] | Freq: Every day | SUBCUTANEOUS | Status: DC
  Administered 2017-12-07: 30 [IU] via SUBCUTANEOUS
  Filled 2017-12-07 (×2): qty 0.3

## 2017-12-07 MED ORDER — FAMOTIDINE 20 MG PO TABS: 20.0000 mg | ORAL_TABLET | Freq: Every day | ORAL | Status: DC

## 2017-12-07 MED ORDER — ADULT MULTIVITAMIN W/MINERALS CH: 1.0000 | ORAL_TABLET | Freq: Every day | ORAL | Status: DC

## 2017-12-07 MED ORDER — BRIMONIDINE TARTRATE 0.2 % OP SOLN
1.0000 [drp] | Freq: Three times a day (TID) | OPHTHALMIC | Status: DC
  Filled 2017-12-07: qty 5

## 2017-12-07 MED ORDER — SODIUM CHLORIDE 4 MEQ/ML IV SOLN
INTRAVENOUS | Status: DC
  Administered 2017-12-07: 04:00:00 via INTRAVENOUS
  Filled 2017-12-07: qty 1000

## 2017-12-07 MED ORDER — LATANOPROST 0.005 % OP SOLN
1.0000 [drp] | Freq: Every day | OPHTHALMIC | Status: DC
  Filled 2017-12-07: qty 2.5

## 2017-12-07 MED ORDER — IRBESARTAN 150 MG PO TABS
150.0000 mg | ORAL_TABLET | Freq: Every day | ORAL | Status: DC
  Administered 2017-12-07: 150 mg via ORAL
  Filled 2017-12-07: qty 1

## 2017-12-07 MED ORDER — INSULIN ASPART 100 UNIT/ML ~~LOC~~ SOLN: 0.0000 [IU] | SUBCUTANEOUS | Status: DC

## 2017-12-07 MED ORDER — GABAPENTIN 300 MG PO CAPS
300.0000 mg | ORAL_CAPSULE | Freq: Three times a day (TID) | ORAL | Status: DC
  Administered 2017-12-07: 300 mg via ORAL
  Filled 2017-12-07: qty 1

## 2017-12-07 MED ORDER — INSULIN DETEMIR 100 UNIT/ML FLEXPEN: 30.0000 [IU] | PEN_INJECTOR | Freq: Every morning | SUBCUTANEOUS | 5 refills | Status: DC

## 2017-12-07 MED ORDER — BRINZOLAMIDE 1 % OP SUSP
1.0000 [drp] | Freq: Three times a day (TID) | OPHTHALMIC | Status: DC
  Filled 2017-12-07: qty 10

## 2017-12-07 MED ORDER — PREMIER PROTEIN SHAKE: 11.0000 [oz_av] | Freq: Two times a day (BID) | ORAL | Status: DC

## 2017-12-07 NOTE — Progress Notes (Addendum)
Inpatient Diabetes Program Recommendations  AACE/ADA: New Consensus Statement on Inpatient Glycemic Control (2019)  Target Ranges:  Prepandial:   less than 140 mg/dL      Peak postprandial:   less than 180 mg/dL (1-2 hours)      Critically ill patients:  140 - 180 mg/dL   Results for Garrett Yoder, Garrett A JR. "Garrett Yoder (MRN [id number]) as of 12/07/2017 09:30  Ref. Range 12/07/2017 00:02 12/07/2017 01:03 12/07/2017 02:10 12/07/2017 03:08 12/07/2017 03:54 12/07/2017 05:01 12/07/2017 05:55 12/07/2017 07:02 12/07/2017 07:59 12/07/2017 09:06  Glucose-Capillary Latest Ref Range: 70 - 99 mg/dL 91 88 104 (H) 75 96 101 (H) 102 (H) 132 (H) 150 (H) 264 (H)  Results for Garrett Yoder, Garrett A JR. "Garrett Yoder (MRN [id number]) as of 12/07/2017 09:30  Ref. Range 12/06/2017 18:57 12/06/2017 19:11 12/06/2017 19:38 12/06/2017 19:55 12/06/2017 20:09 12/06/2017 20:27 12/06/2017 21:19 12/06/2017 21:54 12/06/2017 22:33 12/06/2017 23:01  Glucose-Capillary Latest Ref Range: 70 - 99 mg/dL 93 75 126 (H) 88 82 86 91 90 90 95   Review of Glycemic Control  Diabetes history: DM2 Outpatient Diabetes medications: Levemir 43 units QAM, Metformin 1000 mg BID Current orders for Inpatient glycemic control: None  Inpatient Diabetes Program Recommendations:  Correction (SSI): Please consider ordering CBGs with Novolog 0-9 units Q4H.  HgbA1C: A1C in process.  Insulin - Basal: Once Novolog correction is ordered, if glucose is consistently greater than 180 mg/dl please consider ordering Levemir 18 units Q24H (based on 58.7 kg x 0.3 units).  Outpatient Referral: Patient had one initial visit with Dr. Gabriel Yoder on 01/02/2017 and no follow up with Endocrinologist since then. Patient was reporting issues with hypoglycemia then as well. Per office visit note by PCP on 10/09/2017, patient continues to report issues with hypoglycemia. Strongly recommend patient follow back up with an Endocrinologist for assistance with improving DM control.  NOTE: Noted  consult. Chart reviewed. Patient was admitted with hypoglycemia and reports taking Levemir 43 units daily consistently despite poor PO intake. Noted in reviewing chart, patient has had issues with hypoglycemia for at least 1 year. Recommend patient follow up with Endocrinologist for assistance with DM management.  Addendum 12/07/17@11 :34- Spoke with patient regarding DM control. Patient reports that is was taking Levemir 43 units QAM and Metformin 1000 mg BID for DM control as directed by PCP and last took Levemir 43 units on Sunday morning (12/06/17). Patient states that he was taking Levemir 33 units QAM up until mid summer when PCP increased Levemir to 43 units QAM due to some elevated glucose values. Inquired about seeing Dr. Gabriel Yoder and patient reports that he did see her initially but has not seen her since the first visit since his A1C was okay then.  Patient admits that he has been having issues with hypoglycemia off and on for a while and it has been persistent over the past couple of weeks. Patient states that he has not had any recent changes in weight. Patient questioned if Metformin could be causing lows. Discussed Metformin and Levemir in more detail and explained that he is likely taking too much Levemir. Informed patient it would be recommended to decrease outpatient dose of Levemir, check glucose 4 times per day (before meals and at bedtime), follow up with PCP this week, and make appointment to see Dr. Gabriel Yoder and to consistently follow up with Endocrinologist.  Patient verbalized understanding of information and notes that he wants to go home today.   Thanks, Barnie Alderman, RN, MSN, CDE Diabetes Coordinator Inpatient Diabetes Program [phone] (Team  Pager from [place] to [place])

## 2017-12-07 NOTE — Discharge Summary (Signed)
Garrett Yoder at [place] NAME: Garrett Yoder    MR#:  [id number]  DATE OF BIRTH:  06/18/1945  DATE OF ADMISSION:  12/06/2017   ADMITTING PHYSICIAN: Nicholes Mango, MD  DATE OF DISCHARGE:  12/07/17  PRIMARY CARE PHYSICIAN: [name], MD   ADMISSION DIAGNOSIS:   Hypoglycemia [E16.2]  DISCHARGE DIAGNOSIS:   Active Problems:   Hypoglycemia   Malnutrition of moderate degree   SECONDARY DIAGNOSIS:   Past Medical History:  Diagnosis Date  . Anemia 08/14/2015  . Anxiety   . Aortic atherosclerosis ([place]) 11/25/2016   Chest CT Oct 2018  . Aorto-iliac atherosclerosis ([place]) 11/25/2016   Abd/pelvic CT Oct 2018  . Coronary artery calcification seen on CAT scan 11/25/2016   Chest CT Oct 2018; refer to Dr. Fletcher Anon  . DDD (degenerative disc disease), cervical 08/21/2016  . Depression   . Diabetes mellitus without complication ([place])   . Facet arthropathy, cervical 08/21/2016  . GERD (gastroesophageal reflux disease)   . Heart murmur    As child  . Hyperlipidemia   . Hypertension   . Hypertrophy of prostate with urinary obstruction 11/25/2016   Pelvic CT October 2018  . Incidental lung nodule, > 47m and < 866m10/10/2016   5 mm nodule LLL, chest CT Oct 2018  . Thrombocytosis ([place]6/27/2017    HOSPITAL COURSE:   [age]ear old male with insulin-dependent diabetes mellitus, hypertension, BPH, depression admitted to hospital secondary to altered mental status and hypoglycemia  1.  Hypoglycemia-patient has type 2 diabetes mellitus on insulin -Has had prior hypoglycemic episodes.  No recent follow-up with endocrinology -Was taking Levemir and metformin -A1c is pending.  Poor oral intake recently. -Was on D5 half-normal saline.  Discontinued this morning.  -  -Fingersticks now in the 200s.  Appreciate diabetes coordinator input.  His Levemir dose is decreased to 30 units starting today and will be discharged home with outpatient endocrinology  follow-up.  Patient knows how to monitor his sugars and is aware of the symptoms of hypoglycemia.  Agreed to follow-up. -Metformin can be continued  2.  Hypokalemia-corrected  3.  Glaucoma-continue eyedrops  4.  Hypertension-Avapro   Patient feels great and very insistent about going home.  Ambulated well.  Will discharge home with close follow-up.    DISCHARGE CONDITIONS:   Guarded  CONSULTS OBTAINED:   None  DRUG ALLERGIES:   Allergies  Allergen Reactions  . Codeine Nausea Only    Other reaction(s): makes him sick to his stomach Other reaction(s): makes him sick to his stomach   DISCHARGE MEDICATIONS:   Allergies as of 12/07/2017      Reactions   Codeine Nausea Only   Other reaction(s): makes him sick to his stomach Other reaction(s): makes him sick to his stomach      Medication List    TAKE these medications   acetaminophen 500 MG tablet Commonly known as:  TYLENOL Take 1,000 mg by mouth 2 (two) times daily.   aspirin EC 81 MG tablet Take 81 mg by mouth daily.   atorvastatin 40 MG tablet Commonly known as:  LIPITOR TAKE 1 TABLET BY MOUTH AT  BEDTIME FOR CHOLESTEROL What changed:  See the new instructions.   bimatoprost 0.01 % Soln Commonly known as:  LUMIGAN Place 1 drop into both eyes at bedtime.   finasteride 5 MG tablet Commonly known as:  PROSCAR Take 1 tablet (5 mg total) by mouth daily.   gabapentin 300 MG capsule  Commonly known as:  NEURONTIN Take 300 mg by mouth 3 (three) times daily.   glucose blood test strip To match the OneTouch Ultra 2 device; Dx E11.9, insulin-dependent, LON 99 months; check fingerstick blood sugars four times a day, fluctuating, needs more frequent monitoring   Insulin Detemir 100 UNIT/ML Pen Commonly known as:  LEVEMIR Inject 30 Units into the skin every morning. What changed:  how much to take   irbesartan 150 MG tablet Commonly known as:  AVAPRO TAKE 1 TABLET BY MOUTH  DAILY   metFORMIN 1000 MG  tablet Commonly known as:  GLUCOPHAGE TAKE 1 TABLET BY MOUTH TWO  TIMES DAILY WITH A MEAL   MULTI-VITAMINS Tabs Take 1 tablet by mouth 2 (two) times daily.   ONE TOUCH LANCETS Misc Check fingerstick blood sugars four times a day; LON 99 months; type 2 diabetes on insulin E11.9, fluctuating sugars   ONE TOUCH ULTRA 2 w/Device Kit OneTouch Ultra 2 device; Dx E11.9, insulin-dependent, LON 99 months; check fingerstick blood sugars four times a day, fluctuating, needs more frequent monitoring   Pen Needles 31G X 6 MM Misc For use with pen device to inject insulin once a day; LON 99 months, E11.9, Z79.4   PROBIOTIC DAILY Caps Take 1 capsule by mouth 2 (two) times daily.   ranitidine 150 MG tablet Commonly known as:  ZANTAC TAKE 1 TABLET BY MOUTH TWO  TIMES A DAY AS NEEDED FOR  HEARTBURN What changed:  See the new instructions.   SIMBRINZA 1-0.2 % Susp Generic drug:  Brinzolamide-Brimonidine Place 1 drop into both eyes 3 (three) times daily.   tamsulosin 0.4 MG Caps capsule Commonly known as:  FLOMAX TAKE 1 CAPSULE BY MOUTH  DAILY        DISCHARGE INSTRUCTIONS:   1.  Endocrinology follow-up in 1 week 2.  PCP follow-up in 1 to 2 weeks  DIET:   Diabetic diet  ACTIVITY:   Activity as tolerated  OXYGEN:   Home Oxygen: No.  Oxygen Delivery: room air  DISCHARGE LOCATION:   home   If you experience worsening of your admission symptoms, develop shortness of breath, life threatening emergency, suicidal or homicidal thoughts you must seek medical attention immediately by calling 911 or calling your MD immediately  if symptoms less severe.  You Must read complete instructions/literature along with all the possible adverse reactions/side effects for all the Medicines you take and that have been prescribed to you. Take any new Medicines after you have completely understood and accpet all the possible adverse reactions/side effects.   Please note  You were cared for by a  hospitalist during your hospital stay. If you have any questions about your discharge medications or the care you received while you were in the hospital after you are discharged, you can call the unit and asked to speak with the hospitalist on call if the hospitalist that took care of you is not available. Once you are discharged, your primary care physician will handle any further medical issues. Please note that NO REFILLS for any discharge medications will be authorized once you are discharged, as it is imperative that you return to your primary care physician (or establish a relationship with a primary care physician if you do not have one) for your aftercare needs so that they can reassess your need for medications and monitor your lab values.    On the day of Discharge:  VITAL SIGNS:   Blood pressure (!) 146/103, pulse 81, temperature 97.9 F (  36.6 C), temperature source Axillary, resp. rate 20, height '5\' 8"'$  (1.727 m), weight 58.7 kg, SpO2 98 %.  PHYSICAL EXAMINATION:   GENERAL:  72 y.o.-year-old patient lying in the bed with no acute distress.  EYES: Pupils equal, round, reactive to light and accommodation. No scleral icterus. Extraocular muscles intact.  HEENT: Head atraumatic, normocephalic. Oropharynx and nasopharynx clear.  NECK:  Supple, no jugular venous distention. No thyroid enlargement, no tenderness.  LUNGS: Normal breath sounds bilaterally, no wheezing, rales,rhonchi or crepitation. No use of accessory muscles of respiration.  CARDIOVASCULAR: S1, S2 normal. No  rubs, or gallops.  2/6 systolic murmur is present ABDOMEN: Soft, nontender, nondistended. Bowel sounds present. No organomegaly or mass.  EXTREMITIES: No pedal edema, cyanosis, or clubbing.  NEUROLOGIC: Cranial nerves II through XII are intact. Muscle strength 5/5 in all extremities. Sensation intact. Gait not checked.  PSYCHIATRIC: The patient is alert and oriented x 3.  SKIN: No obvious rash, lesion, or ulcer.    DATA REVIEW:   CBC Recent Labs  Lab 12/07/17 0532  WBC 6.6  HGB 10.7*  HCT 32.5*  PLT 485*    Chemistries  Recent Labs  Lab 12/07/17 0532  NA 142  K 4.0  CL 109  CO2 26  GLUCOSE 109*  BUN 14  CREATININE 1.09  CALCIUM 8.7*  MG 1.8  AST 16  ALT 15  ALKPHOS 4  BILITOT 0.5     Microbiology Results  Results for orders placed or performed during the hospital encounter of 12/06/17  MRSA PCR Screening     Status: None   Collection Time: 12/06/17 10:00 PM  Result Value Ref Range Status   MRSA by PCR NEGATIVE NEGATIVE Final    Comment:        The GeneXpert MRSA Assay (FDA approved for NASAL specimens only), is one component of a comprehensive MRSA colonization surveillance program. It is not intended to diagnose MRSA infection nor to guide or monitor treatment for MRSA infections. Performed at [facility], [address] [place], [place] [address]     RADIOLOGY:  Dg Chest Port 1 View  Result Date: 12/06/2017 CLINICAL DATA:  Hypoglycemia, weakness. EXAM: PORTABLE CHEST 1 VIEW COMPARISON:  Chest CT 11/20/2016 FINDINGS: Linear bibasilar atelectasis or scarring. Heart is borderline in size. No effusions or edema. No acute bony abnormality. IMPRESSION: Borderline heart size.  Bibasilar atelectasis or scarring. Electronically Signed   By: Rolm Baptise M.D.   On: 12/06/2017 19:32     Management plans discussed with the patient, family and they are in agreement.  CODE STATUS:     Code Status Orders  (From admission, onward)         Start     Ordered   12/06/17 2154  Full code  Continuous     12/06/17 2154        Code Status History    Date Active Date Inactive Code Status Order ID Comments User Context   09/24/2016 1848 09/25/2016 1818 Full Code [id number]  Gelene Mink Inpatient   10/06/2015 0344 10/06/2015 1558 Full Code [id number]  Saundra Shelling, MD Inpatient      TOTAL TIME TAKING CARE OF THIS PATIENT: 38 minutes.     Gladstone Lighter M.D on 12/07/2017 at 3:33 PM  Between 7am to 6pm - Pager - [phone]  After 6pm go to [email or web address] - Proofreader  [facility]  Office  [phone]  CC: Primary care physician; [name], MD   Note:  This dictation was prepared with Dragon dictation along with smaller phrase technology. Any transcriptional errors that result from this process are unintentional.

## 2017-12-07 NOTE — Progress Notes (Signed)
Initial Nutrition Assessment  DOCUMENTATION CODES:   Non-severe (moderate) malnutrition in context of social or environmental circumstances  INTERVENTION:  Provide Premier Protein po BID, each supplement provides 160 kcal and 30 grams of protein. Patient prefers chocolate.  Provide daily MVI.  Encouraged intake of small, frequent meals in setting of anorexia and early satiety. Encouraged intake of calorie- and protein-dense foods/beverages.  NUTRITION DIAGNOSIS:   Moderate Malnutrition related to social / environmental circumstances(inadequate oral intake) as evidenced by moderate fat depletion, mild muscle depletion, moderate muscle depletion.  GOAL:   Patient will meet greater than or equal to 90% of their needs  MONITOR:   PO intake, Supplement acceptance, Labs, Weight trends, I & O's  REASON FOR ASSESSMENT:   Consult Assessment of nutrition requirement/status, Poor PO, Diet education  ASSESSMENT:   72 year old male with PMHx of anxiety, depression, HLD, HTN, GERD, anemia, cervical DDD, IDDM admitted with hypoglycemia.   Met with patient at bedside. He reports he had had a poor appetite for a while now. After his spinal surgery he was not eating very well and lost a lot of weight. He then was able to gain most of his weight back but he reports that the past 3 weeks he has had a poor appetite again. He typically eats 3 meals per day but he is only eating 1-2 small meals per day now. He endorses anorexia (loss of appetite) and early satiety. He does not regularly drink an ONS but is amenable to trying them to help meet his calorie/protein needs. He reports he provides a long-acting insulin at home but does not take any short-acting insulin.  Patient reports his UBW was 140 lbs. He reports that since his spinal surgery in 09/2016 he lost about 40 lbs down to 100 lbs. He has since gained back 30 lbs and is around 130 lbs at home. He is currently 58.7 kg (129.41 lbs).  Medications  reviewed and include: Colace, famotidine, gabapentin, Novolog 0-9 units Q4hrs, Levemir 30 units daily.  Labs reviewed: CBG 101-264.  NUTRITION - FOCUSED PHYSICAL EXAM:    Most Recent Value  Orbital Region  Moderate depletion  Upper Arm Region  Moderate depletion  Thoracic and Lumbar Region  Mild depletion  Buccal Region  Moderate depletion  Temple Region  Moderate depletion  Clavicle Bone Region  Moderate depletion  Clavicle and Acromion Bone Region  Moderate depletion  Scapular Bone Region  Moderate depletion  Dorsal Hand  Mild depletion  Patellar Region  Moderate depletion  Anterior Thigh Region  Moderate depletion  Posterior Calf Region  Mild depletion  Edema (RD Assessment)  None  Hair  Reviewed  Eyes  Reviewed  Mouth  Reviewed  Skin  Reviewed  Nails  Reviewed     Diet Order:   Diet Order            Diet - low sodium heart healthy        Diet Carb Modified Fluid consistency: Thin; Room service appropriate? Yes  Diet effective now              EDUCATION NEEDS:   Education needs have been addressed  Skin:  Skin Assessment: Reviewed RN Assessment  Last BM:  Unknown/PTA  Height:   Ht Readings from Last 1 Encounters:  12/06/17 5' 8"  (1.727 m)    Weight:   Wt Readings from Last 1 Encounters:  12/06/17 58.7 kg    Ideal Body Weight:  70 kg  BMI:  Body mass  index is 19.68 kg/m.  Estimated Nutritional Needs:   Kcal:  1585-1850 (MSJ x 1.2-1.4)  Protein:  70-80 grams (1.2-1.4 grams/kg)  Fluid:  1.6-1.8 L/day (1 mL/kcal)  Willey Blade, MS, RD, LDN Office: [phone] Pager: [phone] After Hours/Weekend Pager: [phone]

## 2017-12-07 NOTE — Progress Notes (Signed)
[facility] NAME: Garrett Yoder    MR#:  [id number]  DATE OF BIRTH:  1945/07/07  SUBJECTIVE:  CHIEF COMPLAINT:   Chief Complaint  Patient presents with  . Hypoglycemia   -Admitted with hypoglycemia, while on Levemir. -Sugars improved with D5.  Very adamant about going home today.  REVIEW OF SYSTEMS:  Review of Systems  Constitutional: Negative for chills, fever and malaise/fatigue.  HENT: Negative for congestion, hearing loss, nosebleeds and sinus pain.   Eyes: Negative for blurred vision and double vision.  Respiratory: Negative for cough, shortness of breath and wheezing.   Cardiovascular: Negative for chest pain, palpitations and leg swelling.  Gastrointestinal: Negative for abdominal pain, constipation, diarrhea, nausea and vomiting.  Genitourinary: Negative for dysuria.  Musculoskeletal: Negative for myalgias.  Neurological: Negative for dizziness, focal weakness, seizures, weakness and headaches.  Psychiatric/Behavioral: Negative for depression.    DRUG ALLERGIES:   Allergies  Allergen Reactions  . Codeine Nausea Only    Other reaction(s): makes him sick to his stomach Other reaction(s): makes him sick to his stomach    VITALS:  Blood pressure (!) 146/103, pulse 81, temperature 97.9 F (36.6 C), temperature source Axillary, resp. rate 20, height 5\' 8"  (1.727 m), weight 58.7 kg, SpO2 98 %.  PHYSICAL EXAMINATION:  Physical Exam  GENERAL:  72 y.o.-year-old patient lying in the bed with no acute distress.  EYES: Pupils equal, round, reactive to light and accommodation. No scleral icterus. Extraocular muscles intact.  HEENT: Head atraumatic, normocephalic. Oropharynx and nasopharynx clear.  NECK:  Supple, no jugular venous distention. No thyroid enlargement, no tenderness.  LUNGS: Normal breath sounds bilaterally, no wheezing, rales,rhonchi or crepitation. No use of accessory muscles of respiration.    CARDIOVASCULAR: S1, S2 normal. No  rubs, or gallops.  2/6 systolic murmur is present ABDOMEN: Soft, nontender, nondistended. Bowel sounds present. No organomegaly or mass.  EXTREMITIES: No pedal edema, cyanosis, or clubbing.  NEUROLOGIC: Cranial nerves II through XII are intact. Muscle strength 5/5 in all extremities. Sensation intact. Gait not checked.  PSYCHIATRIC: The patient is alert and oriented x 3.  SKIN: No obvious rash, lesion, or ulcer.    LABORATORY PANEL:   CBC Recent Labs  Lab 12/07/17 0532  WBC 6.6  HGB 10.7*  HCT 32.5*  PLT 485*   ------------------------------------------------------------------------------------------------------------------  Chemistries  Recent Labs  Lab 12/07/17 0532  NA 142  K 4.0  CL 109  CO2 26  GLUCOSE 109*  BUN 14  CREATININE 1.09  CALCIUM 8.7*  MG 1.8  AST 16  ALT 15  ALKPHOS 64  BILITOT 0.5   ------------------------------------------------------------------------------------------------------------------  Cardiac Enzymes Recent Labs  Lab 12/06/17 1907  TROPONINI <0.03   ------------------------------------------------------------------------------------------------------------------  RADIOLOGY:  Dg Chest Port 1 View  Result Date: 12/06/2017 CLINICAL DATA:  Hypoglycemia, weakness. EXAM: PORTABLE CHEST 1 VIEW COMPARISON:  Chest CT 11/20/2016 FINDINGS: Linear bibasilar atelectasis or scarring. Heart is borderline in size. No effusions or edema. No acute bony abnormality. IMPRESSION: Borderline heart size.  Bibasilar atelectasis or scarring. Electronically Signed   By: Rolm Baptise M.D.   On: 12/06/2017 19:32    EKG:   Orders placed or performed during the hospital encounter of 12/06/17  . EKG 12-Lead  . EKG 12-Lead    ASSESSMENT AND PLAN:   72 year old male with insulin-dependent diabetes mellitus, hypertension, BPH, depression admitted to hospital secondary to altered mental status and hypoglycemia  1.   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  No recent follow-up with endocrinology -Was taking Levemir and metformin -A1c is pending.  Poor oral intake recently. -Was on D5 half-normal saline.  Discontinued this morning.  Continue to follow fingersticks very closely.  2.  Hypokalemia-corrected  3.  Glaucoma-we will restart his eyedrops  4.  Hypertension-Avapro can be restarted at discharge  5.  DVT prophylaxis-Lovenox  Patient feels great and very insistent about going home.  We will continue to monitor     All the records are reviewed and case discussed with Care Management/Social Workerr. Management plans discussed with the patient, family and they are in agreement.  CODE STATUS: Full code  TOTAL TIME TAKING CARE OF THIS PATIENT: 38 minutes.   POSSIBLE D/C IN 1-2 DAYS, DEPENDING ON CLINICAL CONDITION.   Gladstone Lighter M.D on 12/07/2017 at 9:57 AM  Between 7am to 6pm - Pager - [phone]  After 6pm go to [email or web address] - password [email or web address]  Office  [phone]  CC: Primary care physician; [name], MD

## 2017-12-08 ENCOUNTER — Encounter: Payer: Self-pay | Admitting: Nurse Practitioner

## 2017-12-08 ENCOUNTER — Ambulatory Visit: Payer: Self-pay | Admitting: *Deleted

## 2017-12-08 ENCOUNTER — Ambulatory Visit (INDEPENDENT_AMBULATORY_CARE_PROVIDER_SITE_OTHER): Payer: Medicare Other | Admitting: Nurse Practitioner

## 2017-12-08 ENCOUNTER — Ambulatory Visit: Payer: Medicare Other

## 2017-12-08 VITALS — BP 104/62 | HR 80 | Temp 98.3°F | Ht 68.0 in | Wt 129.7 lb

## 2017-12-08 DIAGNOSIS — E162 Hypoglycemia, unspecified: Secondary | ICD-10-CM

## 2017-12-08 DIAGNOSIS — E1165 Type 2 diabetes mellitus with hyperglycemia: Secondary | ICD-10-CM | POA: Diagnosis not present

## 2017-12-08 DIAGNOSIS — Z09 Encounter for follow-up examination after completed treatment for conditions other than malignant neoplasm: Secondary | ICD-10-CM

## 2017-12-08 LAB — HEMOGLOBIN A1C
Hgb A1c MFr Bld: 6.4 % — ABNORMAL HIGH (ref 4.8–5.6)
MEAN PLASMA GLUCOSE: 137 mg/dL

## 2017-12-08 LAB — GLUCOSE, POCT (MANUAL RESULT ENTRY): POC GLUCOSE: 134 mg/dL — AB (ref 70–99)

## 2017-12-08 MED ORDER — FREESTYLE LIBRE 14 DAY READER DEVI: 1.0000 | Freq: Every day | 10 refills | Status: DC

## 2017-12-08 NOTE — Patient Instructions (Addendum)
ENDOCRINOLOGY APPOINTMENT WITH Germain Osgood, MD Friday, 12/11/2017 AT 1PM  [address]  [place], [place] [address]  [phone]   ____________________ - Please work on eating consistent healthy snacks throughout the day. If you are having signs of hypoglycemia please check your blood sugar and  Take 15 g of carbohydrates.  Wait 15 minutes.  Re-measure blood glucose.  Repeat this treatment if hypoglycemia persists   Signs of hypoglycemia: Shakiness. Dizziness. Sweating. Hunger. Irritability or moodiness. Anxiety or nervousness. Headache.   These snacks offer 15 grams of carbs with little prep time: 1 small piece of fresh fruit (4 ounces)  1 slice of bread (1 ounce) or 1 (6-inch) tortilla  1/2 cup of oatmeal  2/3 cup of plain fat-free yogurt or sweetened with sugar substitutes   ___________________  We will determine your evening dose of levemir based on your fasting (pre-breakfast) blood sugar. Our morning blood sugar goal is 80-180 mg/dL  If your morning blood sugar value is:  . 70-80 mg/dl= decrease your lantus dose by one unit . < 70 mg/dl = decrease your lantus dose by three units  . If greater than 180 (fasting) = increase your lantus dose by three unit and do not increase your dose again for 2 days

## 2017-12-08 NOTE — Telephone Encounter (Signed)
Pt called with his blood sugar up to 338 this afternoon. His blood sugar before breakfast was 260.  He was hospitalized on  Sunday with a blood sugar of 40 and was discharged on Monday. His discharge medication was changed to lower his levemir to  30 units daily. He feels like it too low and now his blood sugars are higher than normal.  Per protocol, flow at [facility] was notified. Spoke with Dr. Lada, and she is requesting the patient to see the NP in the office. Appointment schedule. Pt voiced understanding and will make the next appt. At 1520.  Reason for Disposition . [1] Blood glucose > 300 mg/dL (16.7 mmol/L) AND [2] two or more times in a row  Answer Assessment - Initial Assessment Questions 1. BLOOD GLUCOSE: "What is your blood glucose level?"      33 8 2. ONSET: "When did you check the blood glucose?"     About an hour ago 3. USUAL RANGE: "What is your glucose level usually?" (e.g., usual fasting morning value, usual evening value)     Evening value before eating was 77 and after eating 185  On the 18 th 4. KETONES: "Do you check for ketones (urine or blood test strips)?" If yes, ask: "What does the test show now?"      no 5. TYPE 1 or 2:  "Do you know what type of diabetes you have?"  (e.g., Type 1, Type 2, Gestational; doesn't know)      Type  6. INSULIN: "Do you take insulin?" "What type of insulin(s) do you use? What is the mode of delivery? (syringe, pen (e.g., injection or  pump)?"      levemir pen 7. DIABETES PILLS: "Do you take any pills for your diabetes?" If yes, ask: "Have you missed taking any pills recently?"     metformin 8. OTHER SYMPTOMS: "Do you have any symptoms?" (e.g., fever, frequent urination, difficulty breathing, dizziness, weakness, vomiting)     no  Protocols used: DIABETES - HIGH BLOOD SUGAR-A-AH

## 2017-12-08 NOTE — Progress Notes (Signed)
Name: Garrett Yoder.   MRN: [id number]    DOB: 1946-02-09   Date:12/08/2017       Progress Note  Subjective  Chief Complaint  Chief Complaint  Patient presents with  . Hospitalization Follow-up    sugar dropped to low  . Hyperglycemia    now sugar is to high 338, Dr. lada recommended him to come in    HPI  Patient presents for hyperglycemia, noted his sugar this afternoon was 338, and before breakfast it was 260. He was recently admitted on 10/20 and discharged on 10/21 for hypoglycemic event- sugar was 46 was given an amp of D10 with rapid improvement; sugar went to 200's but then rapidly declined to 75 in department. Patient takes 43 units of levemir daily and metformin; recently had decreased appetite; he was treated with D5-half saline in hospital; was mildly hypokalemic- but replaced. Upon discharge his levemir was reduced to 30 units daily.    Patient presents with blood sugar logs from the weeks prior to hospitalization with large variance ranging from 72-512. Patient states last year had 2 episodes of hypoglycemia after surgery and had lost weight  Lab Results  Component Value Date   HGBA1C 6.4 (H) 12/07/2017     Patient Active Problem List   Diagnosis Date Noted  . Malnutrition of moderate degree 12/07/2017  . Hypoglycemia 12/06/2017  . Uncontrolled type 2 diabetes mellitus ([place]) 11/27/2016  . Aortic atherosclerosis ([place]) 11/25/2016  . Coronary artery calcification seen on CAT scan 11/25/2016  . Incidental lung nodule, > 49m and < 881m10/10/2016  . Aorto-iliac atherosclerosis ([facility]10/10/2016  . Hypertrophy of prostate with urinary obstruction 11/25/2016  . Abnormal computed tomography of bladder 11/25/2016  . Cervical myelopathy ([facility]08/09/2016  . Neck pain, chronic 08/21/2016  . DDD (degenerative disc disease), cervical 08/21/2016  . Facet arthropathy, cervical 08/21/2016  . De Quervain's tenosynovitis 08/21/2016  . Vertigo 10/06/2015  . Acid reflux  09/09/2015  . Colon cancer screening 08/14/2015  . Essential hypertension, benign 08/14/2015  . Hyperlipidemia 08/14/2015  . Medication monitoring encounter 08/14/2015  . Anemia 08/14/2015  . Thrombocytosis (HC[facility]06/27/2017    Past Medical History:  Diagnosis Date  . Anemia 08/14/2015  . Anxiety   . Aortic atherosclerosis ([facility]CR[place]10/10/2016   Chest CT Oct 2018  . Aorto-iliac atherosclerosis ([place]CM[place]10/10/2016   Abd/pelvic CT Oct 2018  . Coronary artery calcification seen on CAT scan 11/25/2016   Chest CT Oct 2018; refer to Dr. ArFletcher Anon. DDD (degenerative disc disease), cervical 08/21/2016  . Depression   . Diabetes mellitus without complication ([facility] [place]  . Facet arthropathy, cervical 08/21/2016  . GERD (gastroesophageal reflux disease)   . Heart murmur    As child  . Hyperlipidemia   . Hypertension   . Hypertrophy of prostate with urinary obstruction 11/25/2016   Pelvic CT October 2018  . Incidental lung nodule, > 66m58mnd < 8mm52m/10/2016   5 mm nodule LLL, chest CT Oct 2018  . Thrombocytosis (HCC)[place]27/2017    Past Surgical History:  Procedure Laterality Date  . ANTERIOR CERVICAL DECOMP/DISCECTOMY FUSION N/A 09/24/2016   Procedure: ANTERIOR CERVICAL DECOMPRESSION/DISCECTOMY FUSION 3 LEVELS-C4-7;  Surgeon: YarbMeade Maw;  Location: [facility] ORS;  Service: Neurosurgery;  Laterality: N/A;  . CATARACT EXTRACTION    . EYE SURGERY Right    Cataract Extraction with IOL  . RETINAL LASER PROCEDURE      Social History   Tobacco Use  . Smoking status: Former  Smoker    Packs/day: 1.00    Years: 30.00    Pack years: 30.00    Types: Cigarettes    Last attempt to quit: 07/19/1999    Years since quitting: 18.4  . Smokeless tobacco: Never Used  Substance Use Topics  . Alcohol use: No    Alcohol/week: 0.0 standard drinks     Current Outpatient Medications:  .  acetaminophen (TYLENOL) 500 MG tablet, Take 1,000 mg by mouth 2 (two) times daily. , Disp: , Rfl:  .  atorvastatin (LIPITOR) 40  MG tablet, TAKE 1 TABLET BY MOUTH AT  BEDTIME FOR CHOLESTEROL (Patient taking differently: Take 40 mg by mouth at bedtime. ), Disp: 90 tablet, Rfl: 1 .  bimatoprost (LUMIGAN) 0.01 % SOLN, Place 1 drop into both eyes at bedtime. , Disp: , Rfl:  .  Blood Glucose Monitoring Suppl (ONE TOUCH ULTRA 2) w/Device KIT, OneTouch Ultra 2 device; Dx E11.9, insulin-dependent, LON 99 months; check fingerstick blood sugars four times a day, fluctuating, needs more frequent monitoring, Disp: 1 each, Rfl: 0 .  finasteride (PROSCAR) 5 MG tablet, Take 1 tablet (5 mg total) by mouth daily., Disp: 90 tablet, Rfl: 3 .  gabapentin (NEURONTIN) 300 MG capsule, Take 300 mg by mouth 3 (three) times daily., Disp: , Rfl: 1 .  glucose blood test strip, To match the OneTouch Ultra 2 device; Dx E11.9, insulin-dependent, LON 99 months; check fingerstick blood sugars four times a day, fluctuating, needs more frequent monitoring, Disp: 200 each, Rfl: 12 .  Insulin Detemir (LEVEMIR FLEXTOUCH) 100 UNIT/ML Pen, Inject 30 Units into the skin every morning., Disp: 30 mL, Rfl: 5 .  Insulin Pen Needle (PEN NEEDLES) 31G X 6 MM MISC, For use with pen device to inject insulin once a day; LON 99 months, E11.9, Z79.4, Disp: 100 each, Rfl: 3 .  irbesartan (AVAPRO) 150 MG tablet, TAKE 1 TABLET BY MOUTH  DAILY (Patient taking differently: Take 150 mg by mouth daily. ), Disp: 90 tablet, Rfl: 1 .  metFORMIN (GLUCOPHAGE) 1000 MG tablet, TAKE 1 TABLET BY MOUTH TWO  TIMES DAILY WITH A MEAL, Disp: 180 tablet, Rfl: 0 .  Multiple Vitamin (MULTI-VITAMINS) TABS, Take 1 tablet by mouth 2 (two) times daily. , Disp: , Rfl:  .  ONE TOUCH LANCETS MISC, Check fingerstick blood sugars four times a day; LON 99 months; type 2 diabetes on insulin E11.9, fluctuating sugars, Disp: 200 each, Rfl: 11 .  Probiotic Product (PROBIOTIC DAILY) CAPS, Take 1 capsule by mouth 2 (two) times daily. , Disp: , Rfl:  .  ranitidine (ZANTAC) 150 MG tablet, TAKE 1 TABLET BY MOUTH TWO  TIMES  A DAY AS NEEDED FOR  HEARTBURN (Patient taking differently: Take 150 mg by mouth 2 (two) times daily as needed for heartburn. ), Disp: 180 tablet, Rfl: 1 .  SIMBRINZA 1-0.2 % SUSP, Place 1 drop into both eyes 3 (three) times daily. , Disp: , Rfl:  .  tamsulosin (FLOMAX) 0.4 MG CAPS capsule, TAKE 1 CAPSULE BY MOUTH  DAILY (Patient taking differently: Take 0.4 mg by mouth daily. ), Disp: 90 capsule, Rfl: 1 .  aspirin EC 81 MG tablet, Take 81 mg by mouth daily., Disp: , Rfl:  .  Continuous Blood Gluc Receiver (FREESTYLE LIBRE 14 DAY READER) DEVI, 1 Device by Does not apply route daily., Disp: 1 Device, Rfl: 10  Allergies  Allergen Reactions  . Codeine Nausea Only    Other reaction(s): makes him sick to his stomach Other reaction(s):  makes him sick to his stomach    ROS   No other specific complaints in a complete review of systems (except as listed in HPI above).  Objective  Vitals:   12/08/17 1527  BP: 104/62  Pulse: 80  Temp: 98.3 F (36.8 C)  TempSrc: Oral  SpO2: 94%  Weight: 129 lb 11.2 oz (58.8 kg)  Height: 5' 8"  (1.727 m)     Body mass index is 19.72 kg/m.  Nursing Note and Vital Signs reviewed.  Physical Exam  Constitutional: He is oriented to person, place, and time. He appears well-developed and well-nourished.  HENT:  Head: Normocephalic and atraumatic.  Neck: Carotid bruit is not present.  Cardiovascular: Normal heart sounds and intact distal pulses.  Pulmonary/Chest: Effort normal and breath sounds normal.  Abdominal: Soft. Bowel sounds are normal. There is no tenderness. There is no CVA tenderness.  Musculoskeletal: Normal range of motion.  Neurological: He is alert and oriented to person, place, and time. He has normal strength. GCS eye subscore is 4. GCS verbal subscore is 5. GCS motor subscore is 6.  Skin: Skin is warm, dry and intact. Capillary refill takes less than 2 seconds.  Psychiatric: He has a normal mood and affect. His speech is normal and  behavior is normal. Judgment and thought content normal.  Vitals reviewed.     Assessment & Plan  1. Hospital discharge follow-up Medications reconciled   2. Hypoglycemia Appointment with Dr. Gabriel Carina made for Friday at 1pm; patient informed. Discussed hypoglycemia prevention and protocol.  - POCT Glucose (CBG) - Continuous Blood Gluc Receiver (FREESTYLE LIBRE 14 DAY READER) DEVI; 1 Device by Does not apply route daily.  Dispense: 1 Device; Refill: 10  3. Uncontrolled type 2 diabetes mellitus with hyperglycemia (HCC) Discussed goals and given titratable levemir instructions; patient able to verbalize instructions, questions answered - POCT Glucose (CBG) - Continuous Blood Gluc Receiver (FREESTYLE LIBRE 14 DAY READER) DEVI; 1 Device by Does not apply route daily.  Dispense: 1 Device; Refill: 10

## 2017-12-11 DIAGNOSIS — Z794 Long term (current) use of insulin: Secondary | ICD-10-CM | POA: Diagnosis not present

## 2017-12-11 DIAGNOSIS — E11649 Type 2 diabetes mellitus with hypoglycemia without coma: Secondary | ICD-10-CM | POA: Diagnosis not present

## 2017-12-14 ENCOUNTER — Ambulatory Visit
Admission: RE | Admit: 2017-12-14 | Discharge: 2017-12-14 | Disposition: A | Discharge status: Home / Self Care | Payer: Medicare Other | Source: Ambulatory Visit | Attending: Family Medicine | Admitting: Family Medicine

## 2017-12-14 DIAGNOSIS — R918 Other nonspecific abnormal finding of lung field: Secondary | ICD-10-CM | POA: Insufficient documentation

## 2017-12-14 DIAGNOSIS — I700 Atherosclerosis of aorta: Secondary | ICD-10-CM | POA: Diagnosis not present

## 2017-12-14 DIAGNOSIS — I2510 Atherosclerotic heart disease of native coronary artery without angina pectoris: Secondary | ICD-10-CM | POA: Diagnosis not present

## 2017-12-14 DIAGNOSIS — R911 Solitary pulmonary nodule: Secondary | ICD-10-CM

## 2017-12-17 ENCOUNTER — Other Ambulatory Visit: Payer: Self-pay | Admitting: Family Medicine

## 2017-12-31 ENCOUNTER — Ambulatory Visit (INDEPENDENT_AMBULATORY_CARE_PROVIDER_SITE_OTHER): Payer: Medicare Other | Admitting: Family Medicine

## 2017-12-31 ENCOUNTER — Telehealth: Payer: Self-pay | Admitting: *Deleted

## 2017-12-31 ENCOUNTER — Encounter: Payer: Self-pay | Admitting: Family Medicine

## 2017-12-31 VITALS — BP 124/60 | HR 81 | Temp 98.1°F | Ht 68.0 in | Wt 130.9 lb

## 2017-12-31 DIAGNOSIS — E11649 Type 2 diabetes mellitus with hypoglycemia without coma: Secondary | ICD-10-CM

## 2017-12-31 DIAGNOSIS — I2510 Atherosclerotic heart disease of native coronary artery without angina pectoris: Secondary | ICD-10-CM

## 2017-12-31 DIAGNOSIS — R9389 Abnormal findings on diagnostic imaging of other specified body structures: Secondary | ICD-10-CM | POA: Diagnosis not present

## 2017-12-31 DIAGNOSIS — I700 Atherosclerosis of aorta: Secondary | ICD-10-CM | POA: Diagnosis not present

## 2017-12-31 DIAGNOSIS — R918 Other nonspecific abnormal finding of lung field: Secondary | ICD-10-CM | POA: Diagnosis not present

## 2017-12-31 MED ORDER — METFORMIN HCL 1000 MG PO TABS: ORAL_TABLET | ORAL | Status: DC

## 2017-12-31 NOTE — Assessment & Plan Note (Signed)
Managed by Dr. Fletcher Anon; note sent to him to be aware of the chest CT results; patient has no chest pain; patient to call [phone] if it develops; goal LDL less than 70

## 2017-12-31 NOTE — Telephone Encounter (Signed)
Per Roselyn Reef at Dr. Delight Ovens office, Dr. Sanda Klein sent Dr. Genevive Bi a message to review this patient's CT scan and she has not heard back.   Note will be forwarded to Dr.Oaks to review.   Roselyn Reef is aware that Dr. Genevive Bi is in surgery now but given his contact number if Dr. Sanda Klein wishes to call and speak with him directly this afternoon.

## 2017-12-31 NOTE — Assessment & Plan Note (Signed)
Noted on chest CT; LDL is at goal, less than 70; continue statin

## 2017-12-31 NOTE — Patient Instructions (Addendum)
The next step is for you to see the cardiothoracic surgeon, Dr. Nestor Lewandowsky He will meet you and talk with you and look at your films He will determine the next best step, whether that is a chest CT in a certain number of months or a biopsy or both I've notified Dr. Fletcher Anon that you had a chest CT so he can look at the report Your last LDL cholesterol was 59 If the LDL is under 70, we believe that plaque regression can occur; that is, some of the existing blockages in the arteries may reduce in size and stabilize through some changes that happen to the lining of the plaque Return on December 10th for your next visit and we'll get labs then to make sure that LDL is staying down Try to limit saturated fats in your diet (bologna, hot dogs, barbeque, cheeseburgers, hamburgers, steak, bacon, sausage, cheese, etc.) and get more fresh fruits, vegetables, and whole grains

## 2017-12-31 NOTE — Progress Notes (Signed)
BP 124/60   Pulse 81   Temp 98.1 F (36.7 C) (Oral)   Ht 5\' 8"  (1.727 m)   Wt 130 lb 14.4 oz (59.4 kg)   SpO2 98%   BMI 19.90 kg/m    Subjective:    Patient ID: Garrett Spikes., male    DOB: September 02, 1945, 72 y.o.   MRN: [id number]  HPI: Garrett Deboer. is a 72 y.o. male  Chief Complaint  Patient presents with  . Results    CT scan    HPI  Patient is here to discuss the results of his CT scan  Since last visit, Dr. Gabriel Carina has lowered his insulin and moved the timing to night 20-24 units of Levemir at night; almost half as much as before; working out much better No lows below 60; change happened 3 weeks ago He is taking 500 mg of metformin in the morning and 1000 mg in the evening She added to Actos mg to his regimen and it is working well No problems with his feet His weight is stable; no further weight loss Having night sweats but very very rare; once every six months  Chest CT December 14, 2017  CLINICAL DATA:  Left lung nodule.  EXAM: CT CHEST WITHOUT CONTRAST  TECHNIQUE: Multidetector CT imaging of the chest was performed following the standard protocol without IV contrast.  COMPARISON:  Radiograph of December 06, 2017. CT scan of November 20, 2016 and June 02, 2011.  FINDINGS: Cardiovascular: Atherosclerosis of thoracic aorta is noted without aneurysm formation. Normal cardiac size. No pericardial effusion. Coronary artery calcifications are noted.  Mediastinum/Nodes: No enlarged mediastinal or axillary lymph nodes. Thyroid gland, trachea, and esophagus demonstrate no significant findings.  Lungs/Pleura: No pneumothorax or pleural effusion is noted. Nodule seen in left lower lobe on prior exam is no longer visualized. 18 x 8 mm ground-glass opacity is noted posteriorly in right lower lobe best seen on image number 109 of series 3. This is unchanged compared to prior exam of 2018, but is enlarged compared to prior exam of 2013. 6 mm  ground-glass opacity seen more posteriorly in the right lower lobe best seen on image number 111 of series 3. This does not appear to be significantly changed compared to prior exam of 2018 or 2013.  Upper Abdomen: No acute abnormality.  Musculoskeletal: No chest wall mass or suspicious bone lesions identified.  IMPRESSION: Left lower lobe pulmonary nodule noted on prior exam is no longer visualized.  However, stable 18 x 8 mm ground-glass opacity is noted posteriorly in the right lower lobe which is unchanged compared to prior CT scan of 2018, but is enlarged compared to prior exam of 2013. Adenocarcinoma cannot be excluded. Follow up by CT is recommended in 12 months, with continued annual surveillance for a minimum of 3 years.  These recommendations are taken from:  Recommendations for the Management of Subsolid Pulmonary Nodules Detected at CT: A Statement from the [place]  [facility] 2013; 266:1, [phone].  Coronary artery calcifications are noted suggesting coronary artery disease.  Aortic Atherosclerosis (ICD10-I70.0).   Electronically Signed   By: Marijo Conception, M.D.   On: 12/15/2017 08:43  He is a former smoker; started in college, that was the cool thing to do back then; around 42 or 72 years old; then he quit in 2001; never smoked another cigarette; the doctor he had at the time helped him quit  Patient has not chest pain; three weeks ago, he  woke up with a low blood sugar and EMTs were at his house; his wife called rescue; sent to ER, spent the night in the hospital; endo has reduced his insulin; this past Sunday, got to church and saw one of the EMTs, he did not remember her at all  Depression screen [facility] 2/9 12/31/2017 12/08/2017 10/09/2017 07/17/2017 02/24/2017  Decreased Interest 0 0 0 0 0  Down, Depressed, Hopeless 0 0 0 0 0  PHQ - 2 Score 0 0 0 0 0  Altered sleeping 0 0 - - -  Tired, decreased energy 0 0 - - -  Change in appetite 0 0  - - -  Feeling bad or failure about yourself  0 0 - - -  Trouble concentrating 0 0 - - -  Moving slowly or fidgety/restless 0 0 - - -  Suicidal thoughts 0 0 - - -  PHQ-9 Score 0 0 - - -  Difficult doing work/chores Not difficult at all Not difficult at all - - -   Fall Risk  01/08/2018 12/31/2017 12/08/2017 10/09/2017 07/17/2017  Falls in the past year? 0 0 No No No  Number falls in past yr: - 0 - - -    Relevant past medical, surgical, family and social history reviewed Past Medical History:  Diagnosis Date  . Anemia 08/14/2015  . Anxiety   . Aortic atherosclerosis ([place]) 11/25/2016   Chest CT Oct 2018  . Aorto-iliac atherosclerosis ([place]) 11/25/2016   Abd/pelvic CT Oct 2018  . Coronary artery calcification seen on CAT scan 11/25/2016   Chest CT Oct 2018; refer to Dr. Fletcher Anon  . DDD (degenerative disc disease), cervical 08/21/2016  . Depression   . Diabetes mellitus without complication ([place])   . Facet arthropathy, cervical 08/21/2016  . GERD (gastroesophageal reflux disease)   . Heart murmur    As child  . Hyperlipidemia   . Hypertension   . Hypertrophy of prostate with urinary obstruction 11/25/2016   Pelvic CT October 2018  . Incidental lung nodule, > 68mm and < 13mm 11/25/2016   5 mm nodule LLL, chest CT Oct 2018  . Thrombocytosis ([place]) 08/14/2015   Past Surgical History:  Procedure Laterality Date  . ANTERIOR CERVICAL DECOMP/DISCECTOMY FUSION N/A 09/24/2016   Procedure: ANTERIOR CERVICAL DECOMPRESSION/DISCECTOMY FUSION 3 LEVELS-C4-7;  Surgeon: Meade Maw, MD;  Location: [facility] ORS;  Service: Neurosurgery;  Laterality: N/A;  . CATARACT EXTRACTION    . EYE SURGERY Right    Cataract Extraction with IOL  . RETINAL LASER PROCEDURE     Family History  Problem Relation Age of Onset  . Stroke Mother   . Diabetes Mellitus II Father   . Heart Problems Father   . Heart disease Father   . Diabetes Sister   . Cancer Maternal Grandfather   . Stroke Paternal Grandmother   . Prostate  cancer Neg Hx   . Bladder Cancer Neg Hx   . Kidney cancer Neg Hx    Social History   Tobacco Use  . Smoking status: Former Smoker    Packs/day: 1.00    Years: 30.00    Pack years: 30.00    Types: Cigarettes    Last attempt to quit: 07/19/1999    Years since quitting: 18.5  . Smokeless tobacco: Never Used  Substance Use Topics  . Alcohol use: No    Alcohol/week: 0.0 standard drinks  . Drug use: No     Office Visit from 12/31/2017 in [facility]  AUDIT-C  Score  0      Interim medical history since last visit reviewed. Allergies and medications reviewed  Review of Systems Per HPI unless specifically indicated above     Objective:    BP 124/60   Pulse 81   Temp 98.1 F (36.7 C) (Oral)   Ht 5\' 8"  (1.727 m)   Wt 130 lb 14.4 oz (59.4 kg)   SpO2 98%   BMI 19.90 kg/m   Wt Readings from Last 3 Encounters:  01/08/18 131 lb 12.8 oz (59.8 kg)  12/31/17 130 lb 14.4 oz (59.4 kg)  12/08/17 129 lb 11.2 oz (58.8 kg)    Physical Exam  Constitutional: He appears well-developed and well-nourished. No distress.  Eyes: No scleral icterus.  Cardiovascular: Normal rate and regular rhythm.  Pulmonary/Chest: Effort normal and breath sounds normal.  Neurological: He is alert.  Skin: No pallor.  Psychiatric: He has a normal mood and affect.   Diabetic Foot Form - Detailed   Diabetic Foot Exam - detailed Diabetic Foot exam was performed with the following findings:  Yes 12/31/2017  1:11 PM  Visual Foot Exam completed.:  Yes  Pulse Foot Exam completed.:  Yes  Sensory Foot Exam Completed.:  Yes Semmes-Weinstein Monofilament Test        Results for orders placed or performed in visit on 12/08/17  POCT Glucose (CBG)  Result Value Ref Range   POC Glucose 134 (A) 70 - 99 mg/dl      Assessment & Plan:   Problem List Items Addressed This Visit      Cardiovascular and Mediastinum   Coronary artery calcification seen on CAT scan    Managed by Dr. Fletcher Anon; note  sent to him to be aware of the chest CT results; patient has no chest pain; patient to call 911 if it develops; goal LDL less than 70      Aortic atherosclerosis ([place])    Noted on chest CT; LDL is at goal, less than 70; continue statin       Other Visit Diagnoses    Abnormal chest CT    -  Primary   Relevant Orders   Ambulatory referral to Cardiothoracic Surgery   Ground glass opacity present on imaging of lung       Relevant Orders   Ambulatory referral to Cardiothoracic Surgery   Hypoglycemic episode in patient with diabetes mellitus ([place])       3 weeks ago; his insulin has been decreased; managed by endocrinologist   Relevant Medications   pioglitazone (ACTOS) 15 MG tablet   metFORMIN (GLUCOPHAGE) 1000 MG tablet       Follow up plan: No follow-ups on file.  An after-visit summary was printed and given to the patient at [place].  Please see the patient instructions which may contain other information and recommendations beyond what is mentioned above in the assessment and plan.  Meds ordered this encounter  Medications  . metFORMIN (GLUCOPHAGE) 1000 MG tablet    Sig: Take 500 mg by mouth every morning and 1000 mg every evening    Orders Placed This Encounter  Procedures  . Ambulatory referral to Cardiothoracic Surgery

## 2018-01-01 NOTE — Telephone Encounter (Signed)
He should see me in the clinic to review

## 2018-01-04 NOTE — Telephone Encounter (Signed)
Appointment already scheduled for 01-08-18 with Dr. Genevive Bi. Patient's wife confirmed appointment date and time. No reminder call needed per wife.

## 2018-01-08 ENCOUNTER — Ambulatory Visit: Payer: Medicare Other | Admitting: Cardiothoracic Surgery

## 2018-01-08 ENCOUNTER — Other Ambulatory Visit: Payer: Self-pay

## 2018-01-08 ENCOUNTER — Encounter: Payer: Self-pay | Admitting: Cardiothoracic Surgery

## 2018-01-08 VITALS — BP 137/71 | HR 83 | Temp 97.5°F | Resp 16 | Ht 68.0 in | Wt 131.8 lb

## 2018-01-08 DIAGNOSIS — R918 Other nonspecific abnormal finding of lung field: Secondary | ICD-10-CM

## 2018-01-08 NOTE — Progress Notes (Signed)
Patient ID: Garrett Spikes., male   DOB: August 30, 1945, 72 y.o.   MRN: [id number]  Chief Complaint  Patient presents with  . New Patient (Initial Visit)    abnormal CT lung    Referred By Dr. Varney Biles Reason for Referral right lower lobe mass  HPI Location, Quality, Duration, Severity, Timing, Context, Modifying Factors, Associated Signs and Symptoms.  Garrett Yoder. is a 72 y.o. male.  His lung issues began approximately 6 years ago when he had a chest CT scan made.  Over the course of time he has had several other scans including an abdominal CT scan.  Within the last month he had a another CT scan of the chest as part of the lung screening program.  The results of the multiple scans of shown there are 2 small groundglass opacities in the right lower lobe.  One measures about 6 mm and another one measures about 15 mm.  The 15 mm nodule has increased in size over the last 6 years but has not changed in the last 1 year.  He has a 30-pack-year smoking history but quit approximately 20 years ago.  He has no known asbestos exposure.  There is no family history.  He states that he does not get short of breath.  He is currently retired and is reasonably active.  He is able to walk up and down steps without any significant limitations.   Past Medical History:  Diagnosis Date  . Anemia 08/14/2015  . Anxiety   . Aortic atherosclerosis ([place]) 11/25/2016   Chest CT Oct 2018  . Aorto-iliac atherosclerosis ([place]) 11/25/2016   Abd/pelvic CT Oct 2018  . Coronary artery calcification seen on CAT scan 11/25/2016   Chest CT Oct 2018; refer to Dr. Fletcher Anon  . DDD (degenerative disc disease), cervical 08/21/2016  . Depression   . Diabetes mellitus without complication ([place])   . Facet arthropathy, cervical 08/21/2016  . GERD (gastroesophageal reflux disease)   . Heart murmur    As child  . Hyperlipidemia   . Hypertension   . Hypertrophy of prostate with urinary obstruction 11/25/2016   Pelvic CT October  2018  . Incidental lung nodule, > 77m and < 859m10/10/2016   5 mm nodule LLL, chest CT Oct 2018  . [place]rombocytosis [place]2017    Past Surgical History:  Procedure Laterality Date  . ANTERIOR CERVICAL DECOMP/DISCECTOMY FUSION N/A 09/24/2016   Procedure: ANTERIOR CERVICAL DECOMPRESSION/DISCECTOMY FUSION 3 LEVELS-C4-7;  Surgeon: YaMeade MawMD;  Location: [facility] ORS;  Service: Neurosurgery;  Laterality: N/A;  . CATARACT EXTRACTION    . EYE SURGERY Right    Cataract Extraction with IOL  . RETINAL LASER PROCEDURE      Family History  Problem Relation Age of Onset  . Stroke Mother   . Diabetes Mellitus II Father   . Heart Problems Father   . Heart disease Father   . Diabetes Sister   . Cancer Maternal Grandfather   . Stroke Paternal Grandmother   . Prostate cancer Neg Hx   . Bladder Cancer Neg Hx   . Kidney cancer Neg Hx     Social History Social History   Tobacco Use  . Smoking status: Former Smoker    Packs/day: 1.00    Years: 30.00    Pack years: 30.00    Types: Cigarettes    Last attempt to quit: 07/19/1999    Years since quitting: 18.4  . Smokeless tobacco: Never Used  Substance Use  Topics  . Alcohol use: No    Alcohol/week: 0.0 standard drinks  . Drug use: No    Allergies  Allergen Reactions  . Codeine Nausea Only    Other reaction(s): makes him sick to his stomach Other reaction(s): makes him sick to his stomach    Current Outpatient Medications  Medication Sig Dispense Refill  . acetaminophen (TYLENOL) 500 MG tablet Take 1,000 mg by mouth 2 (two) times daily.     Marland Kitchen aspirin EC 81 MG tablet Take 81 mg by mouth daily.    Marland Kitchen atorvastatin (LIPITOR) 40 MG tablet TAKE 1 TABLET BY MOUTH AT  BEDTIME FOR CHOLESTEROL (Patient taking differently: Take 40 mg by mouth at bedtime. ) 90 tablet 1  . bimatoprost (LUMIGAN) 0.01 % SOLN Place 1 drop into both eyes at bedtime.     . Blood Glucose Monitoring Suppl (ONE TOUCH ULTRA 2) w/Device KIT OneTouch Ultra 2 device;  Dx E11.9, insulin-dependent, LON 99 months; check fingerstick blood sugars four times a day, fluctuating, needs more frequent monitoring 1 each 0  . Continuous Blood Gluc Receiver (FREESTYLE LIBRE 14 DAY READER) DEVI 1 Device by Does not apply route daily. 1 Device 10  . finasteride (PROSCAR) 5 MG tablet Take 1 tablet (5 mg total) by mouth daily. 90 tablet 3  . gabapentin (NEURONTIN) 300 MG capsule Take 300 mg by mouth 2 (two) times daily.   1  . glucose blood test strip To match the OneTouch Ultra 2 device; Dx E11.9, insulin-dependent, LON 99 months; check fingerstick blood sugars four times a day, fluctuating, needs more frequent monitoring 200 each 12  . Insulin Detemir (LEVEMIR FLEXTOUCH) 100 UNIT/ML Pen Inject 30 Units into the skin every morning. (Patient taking differently: Inject 24 Units into the skin every morning. ) 30 mL 5  . Insulin Pen Needle (PEN NEEDLES) 31G X 6 MM MISC For use with pen device to inject insulin once a day; LON 99 months, E11.9, Z79.4 100 each 3  . irbesartan (AVAPRO) 150 MG tablet TAKE 1 TABLET BY MOUTH  DAILY (Patient taking differently: Take 150 mg by mouth daily. ) 90 tablet 1  . metFORMIN (GLUCOPHAGE) 1000 MG tablet Take 500 mg by mouth every morning and 1000 mg every evening    . Multiple Vitamin (MULTI-VITAMINS) TABS Take 1 tablet by mouth 2 (two) times daily.     . ONE TOUCH LANCETS MISC Check fingerstick blood sugars four times a day; LON 99 months; type 2 diabetes on insulin E11.9, fluctuating sugars 200 each 11  . pioglitazone (ACTOS) 15 MG tablet Take 15 mg by mouth daily.    . Probiotic Product (PROBIOTIC DAILY) CAPS Take 1 capsule by mouth 2 (two) times daily.     . ranitidine (ZANTAC) 150 MG tablet TAKE 1 TABLET BY MOUTH TWO  TIMES A DAY AS NEEDED FOR  HEARTBURN (Patient taking differently: Take 150 mg by mouth 2 (two) times daily as needed for heartburn. ) 180 tablet 1  . SIMBRINZA 1-0.2 % SUSP Place 1 drop into both eyes 3 (three) times daily.     .  tamsulosin (FLOMAX) 0.4 MG CAPS capsule TAKE 1 CAPSULE BY MOUTH  DAILY (Patient taking differently: Take 0.4 mg by mouth daily. ) 90 capsule 1   No current facility-administered medications for this visit.       Review of Systems A complete review of systems was asked and was negative except for the following positive findings none  Blood pressure 137/71, pulse 83,  temperature (!) 97.5 F (36.4 C), temperature source Temporal, resp. rate 16, height _0  (1.727 m), weight 131 lb 12.8 oz (59.8 kg), SpO2 98 %.  Physical Exam CONSTITUTIONAL:  Pleasant, well-developed, well-nourished, and in no acute distress. EYES: Pupils equal and reactive to light, Sclera non-icteric EARS, NOSE, MOUTH AND THROAT:  The oropharynx was clear.  Dentition is denture.  Oral mucosa pink and moist. LYMPH NODES:  Lymph nodes in the neck and axillae were normal RESPIRATORY:  Lungs were clear.  Normal respiratory effort without pathologic use of accessory muscles of respiration CARDIOVASCULAR: Heart was regular without murmurs.  There were no carotid bruits. GI: The abdomen was soft, nontender, and nondistended. There were no palpable masses. There was no hepatosplenomegaly. There were normal bowel sounds in all quadrants. GU:  Rectal deferred.   MUSCULOSKELETAL:  Normal muscle strength and tone.  No clubbing or cyanosis.   SKIN:  There were no pathologic skin lesions.  There were no nodules on palpation. NEUROLOGIC:  Sensation is normal.  Cranial nerves are grossly intact. PSYCH:  Oriented to person, place and time.  Mood and affect are normal.  Data Reviewed Multiple CT scans  I have personally reviewed the patient's imaging, laboratory findings and medical records.    Assessment    Groundglass opacity in right lower lobe which has enlarged over the last 6 years    Plan    I have independently reviewed the patient's CT scans.  There is a groundglass opacity present within the right lower lobe that has  increased slightly over the last 6 years although not appreciably in the last year.  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  I had a long discussion with him regarding the options.  We discussed continued surveillance with CT scanning, CT-guided needle biopsy and surgery.  He does not wish to pursue any surgical intervention at this time.  However he is open to a CT-guided needle biopsy.  We will go ahead and arrange for that.  I explained to him that our radiologist may or may not feel comfortable biopsying this but that we would request that.  I will see him back once the biopsy is complete.       Nestor Lewandowsky, MD 01/08/2018, 8:54 AM

## 2018-01-08 NOTE — Patient Instructions (Signed)
We will schedule you for a CT lung guided biopsy and call you later today or next week for the appointment information.  We will also make an appointment for you to see dr.Oaks after the biopsy is done.

## 2018-01-11 DIAGNOSIS — H401132 Primary open-angle glaucoma, bilateral, moderate stage: Secondary | ICD-10-CM | POA: Diagnosis not present

## 2018-01-18 ENCOUNTER — Telehealth: Payer: Self-pay

## 2018-01-18 NOTE — Telephone Encounter (Signed)
Call to patient, Dr Genevive Bi would like to see in office this morning for follow up regarding having a CT guided lung biopsy.

## 2018-01-19 ENCOUNTER — Ambulatory Visit: Admission: RE | Admit: 2018-01-19 | Payer: Medicare Other | Source: Ambulatory Visit | Admitting: Gastroenterology

## 2018-01-19 ENCOUNTER — Encounter: Admission: RE | Payer: Self-pay | Source: Ambulatory Visit

## 2018-01-19 SURGERY — COLONOSCOPY WITH PROPOFOL
Anesthesia: General

## 2018-01-21 DIAGNOSIS — Z794 Long term (current) use of insulin: Secondary | ICD-10-CM | POA: Diagnosis not present

## 2018-01-21 DIAGNOSIS — E1165 Type 2 diabetes mellitus with hyperglycemia: Secondary | ICD-10-CM | POA: Diagnosis not present

## 2018-01-25 ENCOUNTER — Other Ambulatory Visit: Payer: Self-pay

## 2018-01-25 ENCOUNTER — Ambulatory Visit: Payer: Medicare Other | Admitting: Cardiothoracic Surgery

## 2018-01-25 ENCOUNTER — Encounter: Payer: Self-pay | Admitting: *Deleted

## 2018-01-25 ENCOUNTER — Encounter: Payer: Self-pay | Admitting: Cardiothoracic Surgery

## 2018-01-25 VITALS — BP 147/71 | HR 87 | Temp 97.0°F | Resp 16 | Ht 68.0 in | Wt 131.6 lb

## 2018-01-25 DIAGNOSIS — R911 Solitary pulmonary nodule: Secondary | ICD-10-CM

## 2018-01-25 NOTE — Progress Notes (Signed)
Referral has been sent to [facility] today. The patient can see Dr.Kasa or another physician at [facility] who can do [place].  After the biopsy is done, patient is aware to follow up with our office to let us know so that we can schedule an appointment to see Dr.Oaks to discuss the results.   If biopsy is unable to be completed, he will still need to be seen for follow up with Dr. Genevive Bi.   Patient verbalizes understanding.

## 2018-01-25 NOTE — Progress Notes (Signed)
  Patient ID: [name], male   DOB: 06/25/1945, 72 y.o.   MRN: [id number]  HISTORY: He returns today in follow-up.  I asked him to come back and discussed the results of the CT scan.  Our radiologist have reviewed the x-rays and have declined biopsy.  They have made the recommendation to follow-up in 1 year with a CT scan.  The patient states that he is asymptomatic.  He has no complaints referable to his heart or lungs.   Vitals:   01/25/18 0911  BP: (!) 147/71  Pulse: 87  Resp: 16  Temp: (!) 97 F (36.1 C)  SpO2: 99%     EXAM:    Resp: Lungs are clear bilaterally.  No respiratory distress, normal effort. Heart:  Regular without murmurs Abd:  Abdomen is soft, non distended and non tender. No masses are palpable.  There is no rebound and no guarding.  Neurological: Alert and oriented to person, place, and time. Coordination normal.  Skin: Skin is warm and dry. No rash noted. No diaphoretic. No erythema. No pallor.  Psychiatric: Normal mood and affect. Normal behavior. Judgment and thought content normal.    ASSESSMENT: I reviewed with him in detail the results of the CT scan.  We reviewed the prior scans as well.  There is an enlarging right lower lobe groundglass opacity which has been present for about 6 years on CT scans.  A slow-growing adenocarcinoma cannot be excluded.  With this information I had asked the radiologist to perform a CT-guided needle biopsy which they have declined.  I therefore asked patient to come back today to review the options for management of his enlarging groundglass opacity.  I reviewed with him in great detail the indications and risks of the potential options at this point.  We could repeat the scan in 1 year which is the current recommendation.  Alternatively we could attempt navigational bronchoscopy.  This would be performed by her pulmonologist.  Finally surgical resection was discussed.   PLAN:   The patient would like to pursue a  navigational bronchoscopy with possible biopsy.  I have asked a referral to be made to the [facility] pulmonary to discuss this as a potential diagnostic procedure.  I will see the patient back after their evaluation and/or biopsy.    Nestor Lewandowsky, MD

## 2018-01-25 NOTE — Patient Instructions (Signed)
We will send the referral to Pulmonary Dr.Kasa. After the biopsy is done please call and let us know so that we can schedule an appointment to see Dr.Oaks to discuss the results.

## 2018-01-26 ENCOUNTER — Ambulatory Visit (INDEPENDENT_AMBULATORY_CARE_PROVIDER_SITE_OTHER): Payer: Medicare Other | Admitting: Family Medicine

## 2018-01-26 ENCOUNTER — Encounter: Payer: Self-pay | Admitting: Family Medicine

## 2018-01-26 VITALS — BP 110/58 | HR 92 | Temp 98.3°F | Ht 68.0 in | Wt 129.7 lb

## 2018-01-26 DIAGNOSIS — E119 Type 2 diabetes mellitus without complications: Secondary | ICD-10-CM | POA: Diagnosis not present

## 2018-01-26 DIAGNOSIS — Z23 Encounter for immunization: Secondary | ICD-10-CM | POA: Diagnosis not present

## 2018-01-26 DIAGNOSIS — I10 Essential (primary) hypertension: Secondary | ICD-10-CM

## 2018-01-26 DIAGNOSIS — E1165 Type 2 diabetes mellitus with hyperglycemia: Secondary | ICD-10-CM

## 2018-01-26 DIAGNOSIS — R911 Solitary pulmonary nodule: Secondary | ICD-10-CM | POA: Diagnosis not present

## 2018-01-26 DIAGNOSIS — I700 Atherosclerosis of aorta: Secondary | ICD-10-CM

## 2018-01-26 DIAGNOSIS — E782 Mixed hyperlipidemia: Secondary | ICD-10-CM

## 2018-01-26 MED ORDER — INSULIN DETEMIR 100 UNIT/ML FLEXPEN: 24.0000 [IU] | PEN_INJECTOR | Freq: Every morning | SUBCUTANEOUS | Status: DC

## 2018-01-26 NOTE — Assessment & Plan Note (Signed)
Patient monitoring pressures at home; feels fine; notify me if trend up or down

## 2018-01-26 NOTE — Progress Notes (Signed)
BP (!) 110/58   Pulse 92   Temp 98.3 F (36.8 C) (Oral)   Ht 5\' 8"  (1.727 m)   Wt 129 lb 11.2 oz (58.8 kg)   SpO2 95%   BMI 19.72 kg/m    Subjective:    Patient ID: Garrett Spikes., male    DOB: 1945/10/04, 72 y.o.   MRN: [id number]  HPI: Garrett Majka. is a 72 y.o. male  Chief Complaint  Patient presents with  . Follow-up    HPI Patient is here for f/u He has an enlargeing RLL groundglass opacity, present for about 6 years, slow-growing adenocarcinoma cannot be excluded Since his last visit with me, he has been to see the cardiothoracic surgeon, Dr. Genevive Bi It was suggested that he under biopsy with interventional radiology, but that was declined by IR They recommended having patient repeat the CT scan in one year (per Dr. Genevive Bi' note yesterday) They decided yesterday to go the route of a navigational bronchoscopy with possible biopsy with [facility] Without knowing what it is, patient is not interested in partial lung resection He will be seeing Dr. Mortimer Fries as the next step going forward No symptoms; no cough, no SHOB He has lost a little weight recently, but part of that is due to trying to watch his eating better for his diabetes He has cut down on carbs; she has prescribed Humalog, but he hasn't gotten it yet; he'll take 5 units with meal containing carbs, and 0 units if no carbs His sugars have been better prior, some great days but a few that weren't so good; paying attention to it He monitors his BP every day; he does not think they are too low; normal 120s and low 130's No chest pain; no wheezing; able to do activities He has aortic athero and coronary artery calcifications; these were seen on imaging, present on previous imaging; I relayed an exchange that I had with cardiologist; if asymptomatic, no further work-up needed; he had a stress test last year Reviewed cholesterol and BP with him today  Lab Results  Component Value Date   CHOL 129 07/17/2017     HDL 55 07/17/2017   LDLCALC 59 07/17/2017   TRIG 69 07/17/2017   CHOLHDL 2.3 07/17/2017     Depression screen [facility] 2/9 01/26/2018 12/31/2017 12/08/2017 10/09/2017 07/17/2017  Decreased Interest 0 0 0 0 0  Down, Depressed, Hopeless 0 0 0 0 0  PHQ - 2 Score 0 0 0 0 0  Altered sleeping 0 0 0 - -  Tired, decreased energy 0 0 0 - -  Change in appetite 0 0 0 - -  Feeling bad or failure about yourself  0 0 0 - -  Trouble concentrating 0 0 0 - -  Moving slowly or fidgety/restless 0 0 0 - -  Suicidal thoughts 0 0 0 - -  PHQ-9 Score 0 0 0 - -  Difficult doing work/chores Not difficult at all Not difficult at all Not difficult at all - -   Fall Risk  01/26/2018 01/25/2018 01/08/2018 12/31/2017 12/08/2017  Falls in the past year? 0 0 0 0 No  Number falls in past yr: - - - 0 -    Relevant past medical, surgical, family and social history reviewed Past Medical History:  Diagnosis Date  . Anemia 08/14/2015  . Anxiety   . Aortic atherosclerosis ([place]) 11/25/2016   Chest CT Oct 2018  . Aorto-iliac atherosclerosis ([place]) 11/25/2016   Abd/pelvic  CT Oct 2018  . Coronary artery calcification seen on CAT scan 11/25/2016   Chest CT Oct 2018; refer to Dr. Fletcher Anon  . DDD (degenerative disc disease), cervical 08/21/2016  . Depression   . Diabetes mellitus without complication ([place])   . Facet arthropathy, cervical 08/21/2016  . GERD (gastroesophageal reflux disease)   . Heart murmur    As child  . Hyperlipidemia   . Hypertension   . Hypertrophy of prostate with urinary obstruction 11/25/2016   Pelvic CT October 2018  . Incidental lung nodule, > 56mm and < 29mm 11/25/2016   5 mm nodule LLL, chest CT Oct 2018  . Thrombocytosis ([place]) 08/14/2015   Past Surgical History:  Procedure Laterality Date  . ANTERIOR CERVICAL DECOMP/DISCECTOMY FUSION N/A 09/24/2016   Procedure: ANTERIOR CERVICAL DECOMPRESSION/DISCECTOMY FUSION 3 LEVELS-C4-7;  Surgeon: Meade Maw, MD;  Location: [facility] ORS;  Service: Neurosurgery;   Laterality: N/A;  . CATARACT EXTRACTION    . EYE SURGERY Right    Cataract Extraction with IOL  . RETINAL LASER PROCEDURE     Family History  Problem Relation Age of Onset  . Stroke Mother   . Diabetes Mellitus II Father   . Heart Problems Father   . Heart disease Father   . Diabetes Sister   . Cancer Maternal Grandfather   . Stroke Paternal Grandmother   . Prostate cancer Neg Hx   . Bladder Cancer Neg Hx   . Kidney cancer Neg Hx    Social History   Tobacco Use  . Smoking status: Former Smoker    Packs/day: 1.00    Years: 30.00    Pack years: 30.00    Types: Cigarettes    Last attempt to quit: 07/19/1999    Years since quitting: 18.5  . Smokeless tobacco: Never Used  Substance Use Topics  . Alcohol use: No    Alcohol/week: 0.0 standard drinks  . Drug use: No     [facility] Visit from 01/26/2018 in [facility]  AUDIT-C Score  0      Interim medical history since last visit reviewed. Allergies and medications reviewed  Review of Systems Per HPI unless specifically indicated above     Objective:    BP (!) 110/58   Pulse 92   Temp 98.3 F (36.8 C) (Oral)   Ht 5\' 8"  (1.727 m)   Wt 129 lb 11.2 oz (58.8 kg)   SpO2 95%   BMI 19.72 kg/m   Wt Readings from Last 3 Encounters:  01/26/18 129 lb 11.2 oz (58.8 kg)  01/25/18 131 lb 9.6 oz (59.7 kg)  01/08/18 131 lb 12.8 oz (59.8 kg)    Physical Exam  Constitutional: He appears well-developed and well-nourished. No distress.  Eyes: No scleral icterus.  Cardiovascular: Normal rate and regular rhythm.  Pulmonary/Chest: Effort normal and breath sounds normal. No accessory muscle usage. No tachypnea. He has no decreased breath sounds. He has no wheezes. He has no rhonchi. He has no rales.  Neurological: He is alert.  Skin: No pallor.  Psychiatric: He has a normal mood and affect.   (patient declined foot exam today)  Results for orders placed or performed in visit on 12/08/17  POCT Glucose (CBG)    Result Value Ref Range   POC Glucose 134 (A) 70 - 99 mg/dl      Assessment & Plan:   Problem List Items Addressed This Visit      Cardiovascular and Mediastinum   Aortic atherosclerosis ([place])  Relevant Orders   Lipid panel   Essential hypertension, benign    Patient monitoring pressures at home; feels fine; notify me if trend up or down        Endocrine   Uncontrolled type 2 diabetes mellitus ([place]) (Chronic)    Managed by endo; so proud of patient's efforts to eat better and monitor his sugars      Relevant Medications   Insulin Detemir (LEVEMIR FLEXTOUCH) 100 UNIT/ML Pen     Other   Hyperlipidemia   Relevant Orders   Lipid panel   Incidental lung nodule, > 61mm and < 30mm - Primary    Already seen by cardiothoracic surgeon; next step is to pursue bronchoscopy with possible biopsy; will see how that procedure goes; patient may contact me for any assistance in this process       Other Visit Diagnoses    Diabetes mellitus without complication ([place])       managed by endo   Relevant Medications   Insulin Detemir (LEVEMIR FLEXTOUCH) 100 UNIT/ML Pen   Need for influenza vaccination       offered and given today   Relevant Orders   Flu vaccine HIGH DOSE PF (Fluzone High dose) (Completed)       Follow up plan: No follow-ups on file.  An after-visit summary was printed and given to the patient at [place].  Please see the patient instructions which may contain other information and recommendations beyond what is mentioned above in the assessment and plan.  Meds ordered this encounter  Medications  . Insulin Detemir (LEVEMIR FLEXTOUCH) 100 UNIT/ML Pen    Sig: Inject 24 Units into the skin every morning.    Increasing dose    Orders Placed This Encounter  Procedures  . Flu vaccine HIGH DOSE PF (Fluzone High dose)  . Lipid panel

## 2018-01-26 NOTE — Assessment & Plan Note (Signed)
Managed by endo; so proud of patient's efforts to eat better and monitor his sugars

## 2018-01-26 NOTE — Patient Instructions (Addendum)
Check with your pharmacist to see if your ranitidine was involved in the recall If so, I'll be glad to get you a substitute  Please have a cholesterol panel done soon You do not have to be fasting Try to limit saturated fats in your diet (bologna, hot dogs, barbeque, cheeseburgers, hamburgers, steak, bacon, sausage, cheese, etc.) and get more fresh fruits, vegetables, and whole grains

## 2018-01-26 NOTE — Assessment & Plan Note (Signed)
Already seen by cardiothoracic surgeon; next step is to pursue bronchoscopy with possible biopsy; will see how that procedure goes; patient may contact me for any assistance in this process

## 2018-01-29 ENCOUNTER — Other Ambulatory Visit: Payer: Self-pay | Admitting: Family Medicine

## 2018-01-29 NOTE — Telephone Encounter (Signed)
Just for metformin

## 2018-01-30 ENCOUNTER — Other Ambulatory Visit: Payer: Self-pay | Admitting: Family Medicine

## 2018-01-30 NOTE — Telephone Encounter (Signed)
He sees endocrinology, so he'll need to get refills of diabetic medicine and supplies from endo

## 2018-02-03 ENCOUNTER — Other Ambulatory Visit: Payer: Self-pay

## 2018-02-03 DIAGNOSIS — E1165 Type 2 diabetes mellitus with hyperglycemia: Secondary | ICD-10-CM

## 2018-02-03 NOTE — Telephone Encounter (Signed)
Pt.notified

## 2018-02-03 NOTE — Telephone Encounter (Signed)
So please ask patient to get all of his diabetic medicines and supplies from his endocrinologist Thank you

## 2018-02-18 ENCOUNTER — Encounter: Payer: Self-pay | Admitting: Internal Medicine

## 2018-02-18 ENCOUNTER — Ambulatory Visit: Payer: Medicare Other | Admitting: Internal Medicine

## 2018-02-18 VITALS — BP 110/62 | HR 70 | Ht 68.0 in | Wt 132.2 lb

## 2018-02-18 DIAGNOSIS — R9389 Abnormal findings on diagnostic imaging of other specified body structures: Secondary | ICD-10-CM | POA: Diagnosis not present

## 2018-02-18 NOTE — Progress Notes (Signed)
Name: Garrett Yoder. MRN: [id number] DOB: Aug 12, 1945     CONSULTATION DATE:  02/18/2018 REFERRING MD : Genevive Bi  CHIEF COMPLAINT: Abnormal CT chest  STUDIES:      12/14/2017 CT chest Independently reviewed by Me Right lower lobe groundglass opacification 17 x 6 mm  CT chest 2013 independently reviewed by me today Right lower lobe groundglass opacification approximately 6 mm    HISTORY OF PRESENT ILLNESS: 73 year old pleasant white male seen today for abnormal CT chest Approximately 6 years ago patient had obtain a CT of his chest for unknown reason Since then he has had annual CT scans of a right lower lobe groundglass opacification which initially started off as a 6 mm in 2013 which has now grown to 17.5 mm in size  Patient has been seen by Dr. Genevive Bi over the last couple of years Patient was advised to undergo right lower lobe resection and surgical evaluation However patient refused at this time and wanted a second opinion with the pulmonologist  I had reviewed the scans with the patient and explained the findings I have reviewed the CT chest over the last several years and the groundglass opacification is a difficult lesion to get to and to obtain significant amount of tissue   I have explained to the patient that this could be a slow-growing tumor consistent with adenocarcinoma  At this time there is no symptoms of shortness of breath chest pain palpitation No fevers no chills No signs of infection at this time  Patient is a former smoker quit in 2001 Smoked 1 pack a day for 30 years       PAST MEDICAL HISTORY :   has a past medical history of Anemia (08/14/2015), Anxiety, Aortic atherosclerosis ([place]) (11/25/2016), Aorto-iliac atherosclerosis ([place]) (11/25/2016), Coronary artery calcification seen on CAT scan (11/25/2016), DDD (degenerative disc disease), cervical (08/21/2016), Depression, Diabetes mellitus without complication ([place]), Facet arthropathy, cervical  (08/21/2016), GERD (gastroesophageal reflux disease), Heart murmur, Hyperlipidemia, Hypertension, Hypertrophy of prostate with urinary obstruction (11/25/2016), Incidental lung nodule, > 53m and < 841m(11/25/2016), and Thrombocytosis ([place](08/14/2015).  has a past surgical history that includes Cataract extraction; Retinal laser procedure; Eye surgery (Right); and Anterior cervical decomp/discectomy fusion (N/A, 09/24/2016). Prior to Admission medications   Medication Sig Start Date End Date Taking? Authorizing Provider  acetaminophen (TYLENOL) 500 MG tablet Take 1,000 mg by mouth 2 (two) times daily.    Yes [provider]  aspirin EC 81 MG tablet Take 81 mg by mouth daily.   Yes [provider]  atorvastatin (LIPITOR) 40 MG tablet TAKE 1 TABLET BY MOUTH AT  BEDTIME FOR CHOLESTEROL 01/30/18  Yes Lada, Melinda P, MD  bimatoprost (LUMIGAN) 0.01 % SOLN Place 1 drop into both eyes at bedtime.    Yes [provider]  Blood Glucose Monitoring Suppl (ONE TOUCH ULTRA 2) w/Device KIT OneTouch Ultra 2 device; Dx E11.9, insulin-dependent, LON 99 months; check fingerstick blood sugars four times a day, fluctuating, needs more frequent monitoring 11/27/16  Yes Lada, MeSatira AnisMD  finasteride (PROSCAR) 5 MG tablet Take 1 tablet (5 mg total) by mouth daily. 09/22/17  Yes Stoioff, ScRonda FairlyMD  gabapentin (NEURONTIN) 300 MG capsule Take 300 mg by mouth 2 (two) times daily.  11/20/16  Yes [provider]  glucose blood test strip To match the OneTouch Ultra 2 device; Dx E11.9, insulin-dependent, LON 99 months; check fingerstick blood sugars four times a day, fluctuating, needs more frequent monitoring 11/27/16  Yes  [name], MD  Insulin Detemir (LEVEMIR FLEXTOUCH) 100 UNIT/ML Pen Inject 24 Units into the skin every morning. 01/26/18  Yes Lada, Satira Anis, MD  Insulin Pen Needle (PEN NEEDLES) 31G X 6 MM MISC For use with pen device to inject insulin once a day; LON 99 months, E11.9,  Z79.4 12/12/16  Yes Lada, Satira Anis, MD  irbesartan (AVAPRO) 150 MG tablet TAKE 1 TABLET BY MOUTH  DAILY Patient taking differently: Take 150 mg by mouth daily.  10/29/17  Yes Lada, Satira Anis, MD  metFORMIN (GLUCOPHAGE) 1000 MG tablet Take 500 mg by mouth every morning and 1000 mg every evening 12/31/17  Yes Lada, Satira Anis, MD  Multiple Vitamin (MULTI-VITAMINS) TABS Take 1 tablet by mouth 2 (two) times daily.    Yes [provider]  ONE TOUCH LANCETS MISC Check fingerstick blood sugars four times a day; LON 99 months; type 2 diabetes on insulin E11.9, fluctuating sugars 11/27/16  Yes Lada, Satira Anis, MD  pioglitazone (ACTOS) 15 MG tablet Take 15 mg by mouth daily.   Yes [provider]  Probiotic Product (PROBIOTIC DAILY) CAPS Take 1 capsule by mouth 2 (two) times daily.    Yes [provider]  ranitidine (ZANTAC) 150 MG tablet TAKE 1 TABLET BY MOUTH TWO  TIMES A DAY AS NEEDED FOR  HEARTBURN Patient taking differently: Take 150 mg by mouth 2 (two) times daily as needed for heartburn.  09/17/17  Yes Lada, Satira Anis, MD  SIMBRINZA 1-0.2 % SUSP Place 1 drop into both eyes 3 (three) times daily.    Yes [provider]  tamsulosin (FLOMAX) 0.4 MG CAPS capsule TAKE 1 CAPSULE BY MOUTH  DAILY Patient taking differently: Take 0.4 mg by mouth daily.  09/17/17  Yes Lada, Satira Anis, MD   Allergies  Allergen Reactions  . Codeine Nausea Only    Other reaction(s): makes him sick to his stomach Other reaction(s): makes him sick to his stomach    FAMILY HISTORY:  family history includes Cancer in his maternal grandfather; Diabetes in his sister; Diabetes Mellitus II in his father; Heart Problems in his father; Heart disease in his father; Stroke in his mother and paternal grandmother. SOCIAL HISTORY:  reports that he quit smoking about 18 years ago. His smoking use included cigarettes. He has a 30.00 pack-year smoking history. He has never used smokeless tobacco. He reports  that he does not drink alcohol or use drugs.  REVIEW OF SYSTEMS:   Constitutional: Negative for fever, chills, weight loss, malaise/fatigue and diaphoresis.  HENT: Negative for hearing loss, ear pain, nosebleeds, congestion, sore throat, neck pain, tinnitus and ear discharge.   Eyes: Negative for blurred vision, double vision, photophobia, pain, discharge and redness.  Respiratory: Negative for cough, hemoptysis, sputum production, shortness of breath, wheezing and stridor.   Cardiovascular: Negative for chest pain, palpitations, orthopnea, claudication, leg swelling and PND.  Gastrointestinal: Negative for heartburn, nausea, vomiting, abdominal pain, diarrhea, constipation, blood in stool and melena.  Genitourinary: Negative for dysuria, urgency, frequency, hematuria and flank pain.  Musculoskeletal: Negative for myalgias, back pain, joint pain and falls.  Skin: Negative for itching and rash.  Neurological: Negative for dizziness, tingling, tremors, sensory change, speech change, focal weakness, seizures, loss of consciousness, weakness and headaches.  Endo/Heme/Allergies: Negative for environmental allergies and polydipsia. Does not bruise/bleed easily.  ALL OTHER ROS ARE NEGATIVE   BP 110/62 (BP Location: Left Arm, Cuff Size: Normal)   Pulse 70   Ht _0  (1.727  m)   Wt 132 lb 3.2 oz (60 kg)   SpO2 100%   BMI 20.10 kg/m   Physical Examination:   GENERAL:NAD, no fevers, chills, no weakness no fatigue HEAD: Normocephalic, atraumatic.  EYES: Pupils equal, round, reactive to light. Extraocular muscles intact. No scleral icterus.  MOUTH: Moist mucosal membrane.   EAR, NOSE, THROAT: Clear without exudates. No external lesions.  NECK: Supple. No thyromegaly. No nodules. No JVD.  PULMONARY:CTA B/L no wheezes, no crackles, no rhonchi CARDIOVASCULAR: S1 and S2. Regular rate and rhythm. No murmurs, rubs, or gallops. No edema.  GASTROINTESTINAL: Soft, nontender, nondistended. No masses.  Positive bowel sounds.  MUSCULOSKELETAL: No swelling, clubbing, or edema. Range of motion full in all extremities.  NEUROLOGIC: Cranial nerves II through XII are intact. No gross focal neurological deficits.  SKIN: No ulceration, lesions, rashes, or cyanosis. Skin warm and dry. Turgor intact.  PSYCHIATRIC: Mood, affect within normal limits. The patient is awake, alert and oriented x 3. Insight, judgment intact.      ASSESSMENT / PLAN:  72 year old pleasant white male seen today for abnormal CT chest of a groundglass opacification in the right lower lobe which has grown from 6 mm to 17.5 mm which is approximately 3 times the size of the last 6 years  This could be and  represent a slow-growing adenocarcinoma of the lung I have recommended that he undergo surgical resection but he would like a second opinion   I have also explained the Risks and Benefits of the Bronchoscopy procedure with electromagnetic navigational bronchoscopy.  I have stated this could be and will be a low yield approach and he understands this  I have discussed the risk for Acute Bleeding, increased chance of Infection, increased chance of Respiratory Failure and Cardiac Arrest, increased chance of pneumothorax and collapsed lung, as well as increased Stroke and Death.  I have also explained to avoid all types of NSAIDs 1 week prior to procedure date  to decrease chance of bleeding, and also to avoid food and drinks the midnight prior to procedure.  The procedure consists of a video camera with a light source to be placed and inserted  into the lungs to  look for abnormal tissue and to obtain tissue samples by using needle and biopsy tools.  The patient understands the risks and benefits and HAS NOT  to proceed with procedure at this time  He would like a second CT surgical opinion at this time.   Patient  satisfied with Plan of action and management. All questions answered Patient is to follow up with [place] once he  makes a decision  Corrin Parker, M.D.  [facility]  Medical Director [place] Director [facility] Cardio-Pulmonary Department

## 2018-02-18 NOTE — Patient Instructions (Signed)
Patient would like second opinion at [facility]

## 2018-02-22 ENCOUNTER — Encounter: Payer: Self-pay | Admitting: *Deleted

## 2018-02-22 NOTE — Progress Notes (Signed)
Per Dr. Zoila Shutter note from 02-18-18, patient is requesting a second CT surgical opinion.   Note to Dr. Genevive Bi as he originally wanted to see the patient after lung biopsy.

## 2018-03-04 ENCOUNTER — Telehealth: Payer: Self-pay | Admitting: *Deleted

## 2018-03-04 NOTE — Telephone Encounter (Signed)
Patient was contacted today to see if he went for second opinion yet.   The patient states he has an appointment with a doctor at [facility] next Tuesday, 03-09-18.   Patient will make a decision after that appointment.   Message routed to Dr. Genevive Bi.

## 2018-03-09 ENCOUNTER — Other Ambulatory Visit: Payer: Self-pay | Admitting: Family Medicine

## 2018-03-09 DIAGNOSIS — R911 Solitary pulmonary nodule: Secondary | ICD-10-CM | POA: Diagnosis not present

## 2018-03-09 DIAGNOSIS — Z7722 Contact with and (suspected) exposure to environmental tobacco smoke (acute) (chronic): Secondary | ICD-10-CM | POA: Insufficient documentation

## 2018-03-09 DIAGNOSIS — Z87891 Personal history of nicotine dependence: Secondary | ICD-10-CM | POA: Diagnosis not present

## 2018-03-15 ENCOUNTER — Telehealth: Payer: Self-pay | Admitting: Internal Medicine

## 2018-03-16 NOTE — Telephone Encounter (Signed)
Will discuss with patient on Friday

## 2018-03-16 NOTE — Telephone Encounter (Signed)
Pt notified all results to be discussed on Friday.

## 2018-03-19 ENCOUNTER — Ambulatory Visit: Payer: Medicare Other | Admitting: Internal Medicine

## 2018-03-19 ENCOUNTER — Encounter: Payer: Self-pay | Admitting: Internal Medicine

## 2018-03-19 VITALS — BP 116/62 | HR 74 | Ht 68.0 in | Wt 131.4 lb

## 2018-03-19 DIAGNOSIS — R911 Solitary pulmonary nodule: Secondary | ICD-10-CM

## 2018-03-19 NOTE — Patient Instructions (Signed)
Plan for ENB   The Risks and Benefits of the Bronchoscopy procedure with ENB were explained to patient  I have discussed the risk for Acute Bleeding, increased chance of Infection, increased chance of Respiratory Failure and Cardiac Arrest, increased chance of pneumothorax and collapsed lung, as well as increased Stroke and Death.  I have also explained to avoid all types of NSAIDs 1 week prior to procedure date  to decrease chance of bleeding, and also to avoid food and drinks the midnight prior to procedure.  The procedure consists of a video camera with a light source to be placed and inserted  into the lungs to  look for abnormal tissue and to obtain tissue samples by using needle and biopsy tools.  The patient/family understand the risks and benefits and have agreed to proceed with procedure.

## 2018-03-19 NOTE — Addendum Note (Signed)
Addended by: Darreld Mclean on: 03/19/2018 11:53 AM   Modules accepted: Orders

## 2018-03-19 NOTE — H&P (View-Only) (Signed)
Name: Garrett Yoder. MRN: [id number] DOB: 12-29-1945     CONSULTATION DATE:  02/18/2018 REFERRING MD : Genevive Bi  CHIEF COMPLAINT: Abnormal CT chest  STUDIES:      12/14/2017 CT chest Independently reviewed by Me Right lower lobe groundglass opacification 17 x 6 mm  CT chest 2013 independently reviewed by me today Right lower lobe groundglass opacification approximately 6 mm  Patient is a former smoker quit in 2001 Smoked 1 pack a day for 30 years   HISTORY OF PRESENT ILLNESS: Follow up abnormal CT chest  RLL GGO has increased in size 6MM to 17.5MM CT images reviewed with patient  No signs of infection No resp symptoms at this time  Patient has asked for further evaluation of GGO in RLL     The Risks and Benefits of the Bronchoscopy procedure with ENB were explained to patient  I have discussed the risk for Acute Bleeding, increased chance of Infection, increased chance of Respiratory Failure and Cardiac Arrest, increased chance of pneumothorax and collapsed lung, as well as increased Stroke and Death.  I have also explained to avoid all types of NSAIDs 1 week prior to procedure date  to decrease chance of bleeding, and also to avoid food and drinks the midnight prior to procedure.  The procedure consists of a video camera with a light source to be placed and inserted  into the lungs to  look for abnormal tissue and to obtain tissue samples by using needle and biopsy tools.  The patient understand the risks and benefits and have agreed to proceed with procedure.       Review of Systems:  Gen:  Denies  fever, sweats, chills weigh loss  HEENT: Denies blurred vision, double vision, ear pain, eye pain, hearing loss, nose bleeds, sore throat Cardiac:  No dizziness, chest pain or heaviness, chest tightness,edema, No JVD Resp:   No cough, -sputum production, -shortness of breath,-wheezing, -hemoptysis,  Gi: Denies swallowing difficulty, stomach pain, nausea or  vomiting, diarrhea, constipation, bowel incontinence Gu:  Denies bladder incontinence, burning urine Ext:   Denies Joint pain, stiffness or swelling Skin: Denies  skin rash, easy bruising or bleeding or hives Endoc:  Denies polyuria, polydipsia , polyphagia or weight change Psych:   Denies depression, insomnia or hallucinations  Other:  All other systems negative    BP 116/62 (BP Location: Left Arm, Cuff Size: Normal)   Pulse 74   Ht 5\' 8"  (1.727 m)   Wt 131 lb 6.4 oz (59.6 kg)   SpO2 98%   BMI 19.98 kg/m    Physical Examination:   GENERAL:NAD, no fevers, chills, no weakness no fatigue HEAD: Normocephalic, atraumatic.  EYES: PERLA, EOMI No scleral icterus.  MOUTH: Moist mucosal membrane.  EAR, NOSE, THROAT: Clear without exudates. No external lesions.  NECK: Supple. No thyromegaly.  No JVD.  PULMONARY: CTA B/L no wheezing, rhonchi, crackles CARDIOVASCULAR: S1 and S2. Regular rate and rhythm. No murmurs GASTROINTESTINAL: Soft, nontender, nondistended. Positive bowel sounds.  MUSCULOSKELETAL: No swelling, clubbing, or edema.  NEUROLOGIC: No gross focal neurological deficits. 5/5 strength all extremities SKIN: No ulceration, lesions, rashes, or cyanosis.  PSYCHIATRIC: Insight, judgment intact. 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  [facility]  Medical Director [place] Director [facility] Cardio-Pulmonary Department

## 2018-03-19 NOTE — Progress Notes (Signed)
Name: [name] MRN: [id number] DOB: 08-17-1945     CONSULTATION DATE:  02/18/2018 REFERRING MD : Genevive Bi  CHIEF COMPLAINT: Abnormal CT chest  STUDIES:      12/14/2017 CT chest Independently reviewed by Me Right lower lobe groundglass opacification 17 x 6 mm  CT chest 2013 independently reviewed by me today Right lower lobe groundglass opacification approximately 6 mm  Patient is a former smoker quit in 2001 Smoked 1 pack a day for 30 years   HISTORY OF PRESENT ILLNESS: Follow up abnormal CT chest  RLL GGO has increased in size 6MM to 17.5MM CT images reviewed with patient  No signs of infection No resp symptoms at this time  Patient has asked for further evaluation of GGO in RLL     The Risks and Benefits of the Bronchoscopy procedure with ENB were explained to patient  I have discussed the risk for Acute Bleeding, increased chance of Infection, increased chance of Respiratory Failure and Cardiac Arrest, increased chance of pneumothorax and collapsed lung, as well as increased Stroke and Death.  I have also explained to avoid all types of NSAIDs 1 week prior to procedure date  to decrease chance of bleeding, and also to avoid food and drinks the midnight prior to procedure.  The procedure consists of a video camera with a light source to be placed and inserted  into the lungs to  look for abnormal tissue and to obtain tissue samples by using needle and biopsy tools.  The patient understand the risks and benefits and have agreed to proceed with procedure.       Review of Systems:  Gen:  Denies  fever, sweats, chills weigh loss  HEENT: Denies blurred vision, double vision, ear pain, eye pain, hearing loss, nose bleeds, sore throat Cardiac:  No dizziness, chest pain or heaviness, chest tightness,edema, No JVD Resp:   No cough, -sputum production, -shortness of breath,-wheezing, -hemoptysis,  Gi: Denies swallowing difficulty, stomach pain, nausea or  vomiting, diarrhea, constipation, bowel incontinence Gu:  Denies bladder incontinence, burning urine Ext:   Denies Joint pain, stiffness or swelling Skin: Denies  skin rash, easy bruising or bleeding or hives Endoc:  Denies polyuria, polydipsia , polyphagia or weight change Psych:   Denies depression, insomnia or hallucinations  Other:  All other systems negative    BP 116/62 (BP Location: Left Arm, Cuff Size: Normal)   Pulse 74   Ht 5\' 8"  (1.727 m)   Wt 131 lb 6.4 oz (59.6 kg)   SpO2 98%   BMI 19.98 kg/m    Physical Examination:   GENERAL:NAD, no fevers, chills, no weakness no fatigue HEAD: Normocephalic, atraumatic.  EYES: PERLA, EOMI No scleral icterus.  MOUTH: Moist mucosal membrane.  EAR, NOSE, THROAT: Clear without exudates. No external lesions.  NECK: Supple. No thyromegaly.  No JVD.  PULMONARY: CTA B/L no wheezing, rhonchi, crackles CARDIOVASCULAR: S1 and S2. Regular rate and rhythm. No murmurs GASTROINTESTINAL: Soft, nontender, nondistended. Positive bowel sounds.  MUSCULOSKELETAL: No swelling, clubbing, or edema.  NEUROLOGIC: No gross focal neurological deficits. 5/5 strength all extremities SKIN: No ulceration, lesions, rashes, or cyanosis.  PSYCHIATRIC: Insight, judgment intact. 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  [facility]  Medical Director [place] Director [facility] Cardio-Pulmonary Department

## 2018-03-22 ENCOUNTER — Telehealth: Payer: Self-pay | Admitting: *Deleted

## 2018-03-22 ENCOUNTER — Telehealth: Payer: Self-pay

## 2018-03-22 NOTE — Telephone Encounter (Signed)
Bronchoscopy ENB Date: 04/07/18@9 :30 CPT: 09030 DX: right lower lobe lung nodule  Also Super D 04/06/18@10 :00

## 2018-03-22 NOTE — Telephone Encounter (Signed)
Spoke to patient in regards to Bronchoscopy and dates. He is aware. He has our number if he has questions. Nothing further at this time.

## 2018-03-22 NOTE — Telephone Encounter (Signed)
Patient called and stated that he wanted to CAN. the scheduled 04/02/18 lab/MD visit and would call back at a later date to R/S.

## 2018-03-22 NOTE — Telephone Encounter (Signed)
03/22/2018 at 12:15 pm EST spoke with Jori Moll at [facility]. [id number] is a valid and billable code.  No PA is required for [id number]. Call Ref # [id number].  No PA required for CT Chest Super D. Rhonda J Cobb

## 2018-03-24 ENCOUNTER — Telehealth: Payer: Self-pay | Admitting: Family Medicine

## 2018-03-24 NOTE — Telephone Encounter (Signed)
I left a message asking the patient to call and schedule Medicare AWV-I with Nurse Health Advisor, Kasey.  If the patient calls back, please schedule Medicare Wellness Visit with Nurse Health Advisor. VDM (DD) °

## 2018-03-29 ENCOUNTER — Encounter
Admission: RE | Admit: 2018-03-29 | Discharge: 2018-03-29 | Disposition: A | Discharge status: Home / Self Care | Payer: Medicare Other | Source: Ambulatory Visit | Attending: Internal Medicine | Admitting: Internal Medicine

## 2018-03-29 ENCOUNTER — Other Ambulatory Visit: Payer: Self-pay

## 2018-03-29 DIAGNOSIS — Z01812 Encounter for preprocedural laboratory examination: Secondary | ICD-10-CM | POA: Diagnosis not present

## 2018-03-29 LAB — CBC
HCT: 34.2 % — ABNORMAL LOW (ref 39.0–52.0)
Hemoglobin: 11.1 g/dL — ABNORMAL LOW (ref 13.0–17.0)
MCH: 30.4 pg (ref 26.0–34.0)
MCHC: 32.5 g/dL (ref 30.0–36.0)
MCV: 93.7 fL (ref 80.0–100.0)
Platelets: 499 10*3/uL — ABNORMAL HIGH (ref 150–400)
RBC: 3.65 MIL/uL — ABNORMAL LOW (ref 4.22–5.81)
RDW: 13 % (ref 11.5–15.5)
WBC: 7 10*3/uL (ref 4.0–10.5)
nRBC: 0 % (ref 0.0–0.2)

## 2018-03-29 LAB — BASIC METABOLIC PANEL
Anion gap: 8 (ref 5–15)
BUN: 25 mg/dL — ABNORMAL HIGH (ref 8–23)
CALCIUM: 9.4 mg/dL (ref 8.9–10.3)
CO2: 25 mmol/L (ref 22–32)
Chloride: 107 mmol/L (ref 98–111)
Creatinine, Ser: 1.25 mg/dL — ABNORMAL HIGH (ref 0.61–1.24)
GFR calc non Af Amer: 58 mL/min — ABNORMAL LOW (ref >60)
Glucose, Bld: 92 mg/dL (ref 70–99)
POTASSIUM: 4.2 mmol/L (ref 3.5–5.1)
SODIUM: 140 mmol/L (ref 135–145)

## 2018-03-29 NOTE — Patient Instructions (Signed)
Your procedure is scheduled on: Wed 04/07/2018 Report to [place]. To find out your arrival time please call [phone] between 1PM - 3PM on Tues 04/06/2018.  Remember: Instructions that are not followed completely may result in serious medical risk, up to and including death, or upon the discretion of your surgeon and anesthesiologist your surgery may need to be rescheduled.     _X__ 1. Do not eat food after midnight the night before your procedure.                 No gum chewing or hard candies. You may drink clear liquids up to 2 hours                 before you are scheduled to arrive for your surgery- DO not drink clear                 liquids within 2 hours of the start of your surgery.                 Clear Liquids include:  water, apple juice without pulp, clear carbohydrate                 drink such as Clearfast or Gatorade, Black Coffee or Tea (Do not add                 anything to coffee or tea).  __X__2.  On the morning of surgery brush your teeth with toothpaste and water, you                 may rinse your mouth with mouthwash if you wish.  Do not swallow any              toothpaste of mouthwash.     _X__ 3.  No Alcohol for 24 hours before or after surgery.   _X__ 4.  Do Not Smoke or use e-cigarettes For 24 Hours Prior to Your Surgery.                 Do not use any chewable tobacco products for at least 6 hours prior to                 surgery.  ____  5.  Bring all medications with you on the day of surgery if instructed.   __X__  6.  Notify your doctor if there is any change in your medical condition      (cold, fever, infections).     Do not wear jewelry, make-up, hairpins, clips or nail polish. Do not wear lotions, powders, or perfumes.  Do not shave 48 hours prior to surgery. Men may shave face and neck. Do not bring valuables to the hospital.    [facility] is not responsible for any belongings or  valuables.  Contacts, dentures/partials or body piercings may not be worn into surgery. Bring a case for your contacts, glasses or hearing aids, a denture cup will be supplied. Leave your suitcase in the car. After surgery it may be brought to your room. For patients admitted to the hospital, discharge time is determined by your treatment team.   Patients discharged the day of surgery will not be allowed to drive home.   Please read over the following fact sheets that you were given:   MRSA Information  __X__ Take these medicines the morning of surgery with A SIP OF WATER:  1. omeprazole (PRILOSEC)   2. tamsulosin (FLOMAX)   3.   4.  5.  6.  ____ Fleet Enema (as directed)   ____ Use CHG Soap/SAGE wipes as directed  ____ Use inhalers on the day of surgery  __X__ Stop metformin/Janumet/Farxiga 2 days prior to surgery    __X__ Take 1/2 of usual insulin dose the night before surgery. No insulin the morning          of surgery.   ____ Stop Blood Thinners Coumadin/Plavix/Xarelto/Pleta/Pradaxa/Eliquis/Effient/Aspirin  on   Or contact your Surgeon, Cardiologist or Medical Doctor regarding  ability to stop your blood thinners  __X__ Stop Anti-inflammatories 7 days before surgery such as Advil, Ibuprofen, Motrin,  BC or Goodies Powder, Naprosyn, Naproxen, Aleve, Aspirin    __X__ Stop all herbal supplements, fish oil or vitamin E until after surgery.    ____ Bring C-Pap to the hospital.

## 2018-04-02 ENCOUNTER — Ambulatory Visit: Payer: Medicare Other | Admitting: Oncology

## 2018-04-02 ENCOUNTER — Other Ambulatory Visit: Payer: Medicare Other

## 2018-04-06 ENCOUNTER — Ambulatory Visit
Admission: RE | Admit: 2018-04-06 | Discharge: 2018-04-06 | Disposition: A | Discharge status: Home / Self Care | Payer: Medicare Other | Source: Ambulatory Visit | Attending: Internal Medicine | Admitting: Internal Medicine

## 2018-04-06 DIAGNOSIS — R918 Other nonspecific abnormal finding of lung field: Secondary | ICD-10-CM | POA: Diagnosis not present

## 2018-04-06 DIAGNOSIS — R911 Solitary pulmonary nodule: Secondary | ICD-10-CM | POA: Diagnosis not present

## 2018-04-06 DIAGNOSIS — Z01811 Encounter for preprocedural respiratory examination: Secondary | ICD-10-CM | POA: Diagnosis not present

## 2018-04-07 ENCOUNTER — Ambulatory Visit: Payer: Medicare Other | Admitting: Anesthesiology

## 2018-04-07 ENCOUNTER — Ambulatory Visit: Payer: Medicare Other

## 2018-04-07 ENCOUNTER — Other Ambulatory Visit: Payer: Self-pay

## 2018-04-07 ENCOUNTER — Ambulatory Visit
Admission: RE | Admit: 2018-04-07 | Discharge: 2018-04-07 | Disposition: A | Discharge status: Home / Self Care | Payer: Medicare Other | Source: Ambulatory Visit | Attending: Internal Medicine | Admitting: Internal Medicine

## 2018-04-07 ENCOUNTER — Encounter: Payer: Self-pay | Admitting: Anesthesiology

## 2018-04-07 ENCOUNTER — Encounter
Admission: RE | Disposition: A | Discharge status: Home / Self Care | Payer: Self-pay | Source: Ambulatory Visit | Attending: Internal Medicine

## 2018-04-07 DIAGNOSIS — Z79899 Other long term (current) drug therapy: Secondary | ICD-10-CM | POA: Diagnosis not present

## 2018-04-07 DIAGNOSIS — R911 Solitary pulmonary nodule: Secondary | ICD-10-CM

## 2018-04-07 DIAGNOSIS — Z9889 Other specified postprocedural states: Secondary | ICD-10-CM

## 2018-04-07 DIAGNOSIS — Z794 Long term (current) use of insulin: Secondary | ICD-10-CM | POA: Insufficient documentation

## 2018-04-07 DIAGNOSIS — I10 Essential (primary) hypertension: Secondary | ICD-10-CM | POA: Diagnosis not present

## 2018-04-07 DIAGNOSIS — R846 Abnormal cytological findings in specimens from respiratory organs and thorax: Secondary | ICD-10-CM | POA: Insufficient documentation

## 2018-04-07 DIAGNOSIS — Z7982 Long term (current) use of aspirin: Secondary | ICD-10-CM | POA: Insufficient documentation

## 2018-04-07 DIAGNOSIS — K219 Gastro-esophageal reflux disease without esophagitis: Secondary | ICD-10-CM | POA: Diagnosis not present

## 2018-04-07 DIAGNOSIS — E785 Hyperlipidemia, unspecified: Secondary | ICD-10-CM | POA: Diagnosis not present

## 2018-04-07 DIAGNOSIS — Z87891 Personal history of nicotine dependence: Secondary | ICD-10-CM | POA: Insufficient documentation

## 2018-04-07 DIAGNOSIS — E119 Type 2 diabetes mellitus without complications: Secondary | ICD-10-CM | POA: Insufficient documentation

## 2018-04-07 DIAGNOSIS — R918 Other nonspecific abnormal finding of lung field: Secondary | ICD-10-CM | POA: Diagnosis not present

## 2018-04-07 DIAGNOSIS — J9811 Atelectasis: Secondary | ICD-10-CM | POA: Diagnosis not present

## 2018-04-07 HISTORY — PX: ELECTROMAGNETIC NAVIGATION BROCHOSCOPY: SHX5369

## 2018-04-07 LAB — GLUCOSE, CAPILLARY
Glucose-Capillary: 198 mg/dL — ABNORMAL HIGH (ref 70–99)
Glucose-Capillary: 187 mg/dL — ABNORMAL HIGH (ref 70–99)

## 2018-04-07 SURGERY — ELECTROMAGNETIC NAVIGATION BRONCHOSCOPY
Anesthesia: General | Laterality: Right

## 2018-04-07 MED ORDER — FENTANYL CITRATE (PF) 100 MCG/2ML IJ SOLN: 25.0000 ug | INTRAMUSCULAR | Status: DC | PRN

## 2018-04-07 MED ORDER — PROPOFOL 10 MG/ML IV BOLUS
INTRAVENOUS | Status: DC | PRN
  Administered 2018-04-07: 140 mg via INTRAVENOUS

## 2018-04-07 MED ORDER — FENTANYL CITRATE (PF) 100 MCG/2ML IJ SOLN
INTRAMUSCULAR | Status: DC | PRN
  Administered 2018-04-07 (×2): 50 ug via INTRAVENOUS

## 2018-04-07 MED ORDER — ONDANSETRON HCL 4 MG/2ML IJ SOLN
INTRAMUSCULAR | Status: DC | PRN
  Administered 2018-04-07: 4 mg via INTRAVENOUS

## 2018-04-07 MED ORDER — LIDOCAINE HCL (CARDIAC) PF 100 MG/5ML IV SOSY
PREFILLED_SYRINGE | INTRAVENOUS | Status: DC | PRN
  Administered 2018-04-07: 100 mg via INTRAVENOUS

## 2018-04-07 MED ORDER — SUGAMMADEX SODIUM 500 MG/5ML IV SOLN
INTRAVENOUS | Status: DC | PRN
  Administered 2018-04-07: 240 mg via INTRAVENOUS

## 2018-04-07 MED ORDER — PHENYLEPHRINE HCL 10 MG/ML IJ SOLN
INTRAMUSCULAR | Status: DC | PRN
  Administered 2018-04-07: 100 ug via INTRAVENOUS

## 2018-04-07 MED ORDER — FENTANYL CITRATE (PF) 100 MCG/2ML IJ SOLN
INTRAMUSCULAR | Status: AC
  Filled 2018-04-07: qty 2

## 2018-04-07 MED ORDER — DEXAMETHASONE SODIUM PHOSPHATE 10 MG/ML IJ SOLN
INTRAMUSCULAR | Status: DC | PRN
  Administered 2018-04-07: 10 mg via INTRAVENOUS

## 2018-04-07 MED ORDER — SODIUM CHLORIDE 0.9 % IV SOLN
INTRAVENOUS | Status: DC
  Administered 2018-04-07: 09:00:00 via INTRAVENOUS

## 2018-04-07 MED ORDER — ONDANSETRON HCL 4 MG/2ML IJ SOLN: 4.0000 mg | Freq: Once | INTRAMUSCULAR | Status: DC | PRN

## 2018-04-07 MED ORDER — GLYCOPYRROLATE 0.2 MG/ML IJ SOLN
INTRAMUSCULAR | Status: DC | PRN
  Administered 2018-04-07: 0.2 mg via INTRAVENOUS

## 2018-04-07 MED ORDER — SUCCINYLCHOLINE CHLORIDE 20 MG/ML IJ SOLN
INTRAMUSCULAR | Status: DC | PRN
  Administered 2018-04-07: 80 mg via INTRAVENOUS

## 2018-04-07 MED ORDER — ESMOLOL HCL 100 MG/10ML IV SOLN
INTRAVENOUS | Status: DC | PRN
  Administered 2018-04-07: 30 mg via INTRAVENOUS

## 2018-04-07 MED ORDER — ROCURONIUM BROMIDE 100 MG/10ML IV SOLN
INTRAVENOUS | Status: DC | PRN
  Administered 2018-04-07: 50 mg via INTRAVENOUS

## 2018-04-07 NOTE — Interval H&P Note (Signed)
History and Physical Interval Note:  04/07/2018 8:45 AM  Garrett Yoder.  has presented today for surgery, with the diagnosis of Lung nodule, RLL  The various methods of treatment have been discussed with the patient and family. After consideration of risks, benefits and other options for treatment, the patient has consented to  Procedure(s): ELECTROMAGNETIC NAVIGATION BRONCHOSCOPY (Right) as a surgical intervention .  The patient's history has been reviewed, patient examined, no change in status, stable for surgery.  I have reviewed the patient's chart and labs.  Questions were answered to the patient's satisfaction.     Flora Lipps    The Risks and Benefits of the Bronchoscopy procedure with ENB were explained to patient/family.  I have discussed the risk for Acute Bleeding, increased chance of Infection, increased chance of Respiratory Failure and Cardiac Arrest, increased chance of pneumothorax and collapsed lung, as well as increased Stroke and Death.  I have also explained to avoid all types of NSAIDs 1 week prior to procedure date  to decrease chance of bleeding, and also to avoid food and drinks the midnight prior to procedure.  The procedure consists of a video camera with a light source to be placed and inserted  into the lungs to  look for abnormal tissue and to obtain tissue samples by using needle and biopsy tools.  The patient/family understand the risks and benefits and have agreed to proceed with procedure.   Patient/Family are satisfied with Plan of action and management. All questions answered  Corrin Parker, M.D.  [facility]  Medical Director [place] Director [facility] Cardio-Pulmonary Department

## 2018-04-07 NOTE — Discharge Instructions (Signed)
Flexible Bronchoscopy, Care After  This sheet gives you information about how to care for yourself after your test. Your doctor may also give you more specific instructions. If you have problems or questions, contact your doctor.  Follow these instructions at home:  Eating and drinking   Do not eat or drink anything (not even water) for 2 hours after your test, or until your numbing medicine (local anesthetic) wears off.   When your numbness is gone and your cough and gag reflexes have come back, you may:  ? Eat only soft foods.  ? Slowly drink liquids.   The day after the test, go back to your normal diet.  Driving   Do not drive for 24 hours if you were given a medicine to help you relax (sedative).   Do not drive or use heavy machinery while taking prescription pain medicine.  General instructions     Take over-the-counter and prescription medicines only as told by your doctor.   Return to your normal activities as told. Ask what activities are safe for you.   Do not use any products that have nicotine or tobacco in them. This includes cigarettes and e-cigarettes. If you need help quitting, ask your doctor.   Keep all follow-up visits as told by your doctor. This is important. It is very important if you had a tissue sample (biopsy) taken.  Get help right away if:   You have shortness of breath that gets worse.   You get light-headed.   You feel like you are going to pass out (faint).   You have chest pain.   You cough up:  ? More than a little blood.  ? More blood than before.  Summary   Do not eat or drink anything (not even water) for 2 hours after your test, or until your numbing medicine wears off.   Do not use cigarettes. Do not use e-cigarettes.   Get help right away if you have chest pain.  This information is not intended to replace advice given to you by your health care provider. Make sure you discuss any questions you have with your health care provider.  Document Released: 12/01/2008  Document Revised: 02/22/2016 Document Reviewed: 02/22/2016  Elsevier Interactive Patient Education  2019 [employer] Inc[employer]

## 2018-04-07 NOTE — Anesthesia Procedure Notes (Signed)
Procedure Name: Intubation Date/Time: 04/07/2018 9:47 AM Performed by: Kelton Pillar, CRNA Pre-anesthesia Checklist: Patient identified, Emergency Drugs available, Suction available and Patient being monitored Patient Re-evaluated:Patient Re-evaluated prior to induction Oxygen Delivery Method: Circle system utilized Preoxygenation: Pre-oxygenation with 100% oxygen Induction Type: IV induction Ventilation: Mask ventilation without difficulty Laryngoscope Size: McGraph and 3 Grade View: Grade I Tube type: Oral Tube size: 8.5 mm Number of attempts: 1 Airway Equipment and Method: Stylet Placement Confirmation: ETT inserted through vocal cords under direct vision,  positive ETCO2,  CO2 detector and breath sounds checked- equal and bilateral Secured at: 21 cm Tube secured with: Tape Dental Injury: Teeth and Oropharynx as per pre-operative assessment

## 2018-04-07 NOTE — Anesthesia Post-op Follow-up Note (Signed)
Anesthesia QCDR form completed.        

## 2018-04-07 NOTE — Anesthesia Postprocedure Evaluation (Signed)
Anesthesia Post Note  Patient: Garrett Yoder.  Procedure(s) Performed: ELECTROMAGNETIC NAVIGATION BRONCHOSCOPY (Right )  Patient location during evaluation: PACU Anesthesia Type: General Level of consciousness: awake and alert Pain management: pain level controlled Vital Signs Assessment: post-procedure vital signs reviewed and stable Respiratory status: spontaneous breathing, nonlabored ventilation, respiratory function stable and patient connected to nasal cannula oxygen Cardiovascular status: blood pressure returned to baseline and stable Postop Assessment: no apparent nausea or vomiting Anesthetic complications: no     Last Vitals:  Vitals:   04/07/18 1115 04/07/18 1125  BP: (!) 121/59 (!) 127/48  Pulse: 81 80  Resp: 15 16  Temp: (!) 36.4 C 36.6 C  SpO2: 100% 100%    Last Pain:  Vitals:   04/07/18 1125  TempSrc: Temporal  PainSc: 0-No pain                 Kryssa Risenhoover S

## 2018-04-07 NOTE — Transfer of Care (Signed)
Immediate Anesthesia Transfer of Care Note  Patient: Garrett Yoder.  Procedure(s) Performed: ELECTROMAGNETIC NAVIGATION BRONCHOSCOPY (Right )  Patient Location: PACU  Anesthesia Type:General  Level of Consciousness: awake, alert , oriented and patient cooperative  Airway & Oxygen Therapy: Patient Spontanous Breathing and Patient connected to face mask oxygen  Post-op Assessment: Report given to RN and Post -op Vital signs reviewed and stable  Post vital signs: Reviewed and stable  Last Vitals:  Vitals Value Taken Time  BP 129/63 04/07/2018 10:39 AM  Temp 35.8 C 04/07/2018 10:39 AM  Pulse 85 04/07/2018 10:41 AM  Resp 11 04/07/2018 10:41 AM  SpO2 100 % 04/07/2018 10:41 AM  Vitals shown include unvalidated device data.  Last Pain:  Vitals:   04/07/18 1039  TempSrc:   PainSc: 0-No pain         Complications: No apparent anesthesia complications

## 2018-04-07 NOTE — Op Note (Signed)
Electromagnetic Navigation Bronchoscopy: Indication: lung nodule  Preoperative Diagnosis:lung nodule Post Procedure Diagnosis:lung nodule Consent: Verbal/Written  The Risks and Benefits of the procedure explained to patient/family prior to start of procedure and I have discussed the risk for acute bleeding, increased chance of infection, increased chance of respiratory failure and cardiac arrest and death.  I have also explained to avoid all types of NSAIDs to decrease chance of bleeding, and to avoid food and drinks the midnight prior to procedure.  The procedure consists of a video camera with a light source to be placed and inserted  into the lungs to  look for abnormal tissue and to obtain tissue samples by using needle and biopsy tools.  The patient/family understand the risks and benefits and have agreed to proceed with procedure.   Hand washing performed prior to starting the procedure.   Type of Anesthesia: see Anesthesiology records .   Procedure Performed:  Virtual Bronchoscopy with Multi-planar Image analysis, 3-D reconstruction of coronal, sagittal and multi-planar images for the purposes of planning real-time bronchoscopy using the iLogic Electromagnetic Navigation Bronchoscopy System (superDimension).  Description of Procedure: After obtaining informed consent from the patient, the above sedative and anesthetic measures were carried out, flexible fiberoptic bronchoscope was inserted via Endotracheal tube after patient was intubated by CNA/Anesthesiologist.   The virtual camera was then placed into the central portion of the trachea. The trachea itself was inspected.  The main carina, right and left midstem bronchus and all the segmental and subsegmental airways by virtual bronchoscopy were inspected. The camera was directed to standard registration points at the following centers: main carina, right upper lobe bronchus, right lower lobe bronchus, right middle lobe bronchus,  left upper lobe bronchus, and the left lower lobe bronchus. This data was transferred to the i-Logic ENB system for real-time bronchoscopy.   The scope was then navigated to the RLL for tissue sampling  Cellvizio images were c/w or may suggest inflammatory process versus Lipidic growth.  Specimans Obtained:  Transbronchial Brush:4  Fluoroscopy:  Fluoroscopy was utilized during the course of this procedure to assure that biopsies were taken in a safe manner under fluoroscopic guidance with spot films required.   Complications:None  Estimated Blood Loss: minimal approx 1cc  Monitoring:  The patient was monitored with continuous oximetry and received supplemental nasal cannula oxygen throughout the procedure. In addition, serial blood pressure measurements and continuous electrocardiography showed these physiologic parameters to remain tolerable throughout the procedure.   Assessment and Plan/Additional Comments: Follow up Pathology Reports    Corrin Parker, M.D.  [facility]  Medical Director [place] Director [facility] Cardio-Pulmonary Department

## 2018-04-07 NOTE — Anesthesia Preprocedure Evaluation (Signed)
Anesthesia Evaluation  Patient identified by MRN, date of birth, ID band Patient awake    Reviewed: Allergy & Precautions, NPO status , Patient's Chart, lab work & pertinent test results, reviewed documented beta blocker date and time   Airway Mallampati: II  TM Distance: >3 FB     Dental  (+) Chipped   Pulmonary former smoker,           Cardiovascular hypertension, Pt. on medications + CAD       Neuro/Psych PSYCHIATRIC DISORDERS Anxiety Depression    GI/Hepatic GERD  ,  Endo/Other  diabetes, Type 2  Renal/GU      Musculoskeletal   Abdominal   Peds  Hematology  (+) anemia ,   Anesthesia Other Findings   Reproductive/Obstetrics                             Anesthesia Physical Anesthesia Plan  ASA: III  Anesthesia Plan: General   Post-op Pain Management:    Induction: Intravenous  PONV Risk Score and Plan:   Airway Management Planned: Oral ETT  Additional Equipment:   Intra-op Plan:   Post-operative Plan:   Informed Consent: I have reviewed the patients History and Physical, chart, labs and discussed the procedure including the risks, benefits and alternatives for the proposed anesthesia with the patient or authorized representative who has indicated his/her understanding and acceptance.       Plan Discussed with: CRNA  Anesthesia Plan Comments:         Anesthesia Quick Evaluation

## 2018-04-08 LAB — CYTOLOGY - NON PAP

## 2018-04-14 ENCOUNTER — Encounter: Payer: Self-pay | Admitting: *Deleted

## 2018-04-14 ENCOUNTER — Telehealth: Payer: Self-pay | Admitting: Family Medicine

## 2018-04-14 NOTE — Progress Notes (Signed)
Patient did have ENB with Dr. Mortimer Fries.   The patient was scheduled for follow up with Dr. Genevive Bi on 04-16-18.  Patient cancelled appointment with Dr. Genevive Bi. He told Georgina Peer that he already received his results and did not need follow up appointment.   Dr. Genevive Bi notified.

## 2018-04-14 NOTE — Telephone Encounter (Signed)
I called the patient to schedule AWV with Kasey.  Someone answered then hung up.  I'll try again later. VDM (DD)

## 2018-04-16 ENCOUNTER — Ambulatory Visit: Payer: Medicare Other | Admitting: Cardiothoracic Surgery

## 2018-04-24 ENCOUNTER — Other Ambulatory Visit: Payer: Self-pay | Admitting: Family Medicine

## 2018-04-24 NOTE — Telephone Encounter (Signed)
Lab Results  Component Value Date   CREATININE 1.25 (H) 03/29/2018   Lab Results  Component Value Date   K 4.2 03/29/2018

## 2018-04-28 ENCOUNTER — Other Ambulatory Visit: Payer: Self-pay | Admitting: Family Medicine

## 2018-05-13 DIAGNOSIS — E1165 Type 2 diabetes mellitus with hyperglycemia: Secondary | ICD-10-CM | POA: Diagnosis not present

## 2018-05-13 DIAGNOSIS — Z794 Long term (current) use of insulin: Secondary | ICD-10-CM | POA: Diagnosis not present

## 2018-05-13 LAB — HEMOGLOBIN A1C: Hemoglobin A1C: 7.1

## 2018-05-21 DIAGNOSIS — E1165 Type 2 diabetes mellitus with hyperglycemia: Secondary | ICD-10-CM | POA: Diagnosis not present

## 2018-05-21 DIAGNOSIS — Z794 Long term (current) use of insulin: Secondary | ICD-10-CM | POA: Diagnosis not present

## 2018-05-28 ENCOUNTER — Ambulatory Visit (INDEPENDENT_AMBULATORY_CARE_PROVIDER_SITE_OTHER): Payer: Medicare Other | Admitting: Family Medicine

## 2018-05-28 ENCOUNTER — Other Ambulatory Visit: Payer: Self-pay

## 2018-05-28 ENCOUNTER — Encounter: Payer: Self-pay | Admitting: Family Medicine

## 2018-05-28 ENCOUNTER — Telehealth: Payer: Self-pay | Admitting: Family Medicine

## 2018-05-28 VITALS — BP 137/70 | HR 66

## 2018-05-28 DIAGNOSIS — I10 Essential (primary) hypertension: Secondary | ICD-10-CM | POA: Diagnosis not present

## 2018-05-28 DIAGNOSIS — E782 Mixed hyperlipidemia: Secondary | ICD-10-CM

## 2018-05-28 DIAGNOSIS — Z5181 Encounter for therapeutic drug level monitoring: Secondary | ICD-10-CM | POA: Diagnosis not present

## 2018-05-28 DIAGNOSIS — R911 Solitary pulmonary nodule: Secondary | ICD-10-CM

## 2018-05-28 DIAGNOSIS — N138 Other obstructive and reflux uropathy: Secondary | ICD-10-CM

## 2018-05-28 DIAGNOSIS — I2510 Atherosclerotic heart disease of native coronary artery without angina pectoris: Secondary | ICD-10-CM | POA: Diagnosis not present

## 2018-05-28 DIAGNOSIS — I700 Atherosclerosis of aorta: Secondary | ICD-10-CM

## 2018-05-28 DIAGNOSIS — N401 Enlarged prostate with lower urinary tract symptoms: Secondary | ICD-10-CM

## 2018-05-28 DIAGNOSIS — Z794 Long term (current) use of insulin: Secondary | ICD-10-CM

## 2018-05-28 DIAGNOSIS — E1165 Type 2 diabetes mellitus with hyperglycemia: Secondary | ICD-10-CM | POA: Diagnosis not present

## 2018-05-28 DIAGNOSIS — I708 Atherosclerosis of other arteries: Secondary | ICD-10-CM

## 2018-05-28 DIAGNOSIS — E119 Type 2 diabetes mellitus without complications: Secondary | ICD-10-CM

## 2018-05-28 MED ORDER — INSULIN DETEMIR 100 UNIT/ML FLEXPEN: 30.0000 [IU] | PEN_INJECTOR | Freq: Every day | SUBCUTANEOUS | Status: DC

## 2018-05-28 MED ORDER — METFORMIN HCL 1000 MG PO TABS: 500.0000 mg | ORAL_TABLET | Freq: Every day | ORAL | Status: DC

## 2018-05-28 NOTE — Assessment & Plan Note (Signed)
Managed by pulmonologist; recent bronch

## 2018-05-28 NOTE — Assessment & Plan Note (Signed)
Controlled; continue ARB

## 2018-05-28 NOTE — Progress Notes (Signed)
BP 137/70   Pulse 66    Subjective:    Patient ID: [name], male    DOB: 04-18-1945, 73 y.o.   MRN: [id number]  HPI: Roben Schliep. is a 73 y.o. male  Chief Complaint  Patient presents with  . Follow-up  . Diabetes    BS:133    HPI Virtual Visit via Telephone/Video Note   I connected with the patient via:  telephone I verified that I am speaking with the correct person using two identifiers.  Call started: 1:33 pm Call terminated: 1:52 pm Total length of call: 19 minutes and 15 seconds   I discussed the limitations, risks, and privacy concerns of performing an evaluation and management service by telephone and the availability of in-person appointments. I explained that he/she may be responsible for charges related to this service. The patient expressed understanding and agreed to proceed.  Provider location: home, upstairs office with door closed, earphones/headset on Patient location: home, bedroom Additional participants: wife is on other end of the house  Type 2 diabetes mellitus He saw his endocrinologist last week, feet were checked A1c was 7.1 in March 2020 Checking sugars 2x a day; he is pleased with his recent readings No low sugars recently Urine microalb:Cr was normal on May 21, 2018  Lab Results  Component Value Date   HGBA1C 7.1 05/13/2018   High cholesterol with aorto-iliac athero and coronary artery athero No chest pain Last LDL was 59 Taking atorvastatin Avoiding fried foods, avoiding bacon and sausage; trying to eat [place] and chicken for meat; just an occasional piece of ham  Lab Results  Component Value Date   CHOL 129 07/17/2017   HDL 55 07/17/2017   LDLCALC 59 07/17/2017   TRIG 69 07/17/2017   CHOLHDL 2.3 07/17/2017   HTN; recent BP checked, usually running even better than 782 systolic; usually [id number] Just taking ARB, irbesartan 150 mg He does not use much salt, uses pepper; usually doesn't add salt  On two  medicines for prostate, BPH; managed by Dr. Bernardo Heater, urologist; has f/u soon  Lung nodule; he has been seeing Dr. Mortimer Fries and Dr. Genevive Bi; he has not had any more visits with Dr. Genevive Bi; he had broncho with Dr. Mortimer Fries and he got a good biopsy; he described it as inflammatory cells, nothing to get overly excited about; no symptoms  Staying UTD with eye visits  Depression screen [facility] 2/9 05/28/2018 01/26/2018 12/31/2017 12/08/2017 10/09/2017  Decreased Interest 0 0 0 0 0  Down, Depressed, Hopeless 0 0 0 0 0  PHQ - 2 Score 0 0 0 0 0  Altered sleeping 0 0 0 0 -  Tired, decreased energy 0 0 0 0 -  Change in appetite 0 0 0 0 -  Feeling bad or failure about yourself  0 0 0 0 -  Trouble concentrating 0 0 0 0 -  Moving slowly or fidgety/restless 0 0 0 0 -  Suicidal thoughts 0 0 0 0 -  PHQ-9 Score 0 0 0 0 -  Difficult doing work/chores Not difficult at all Not difficult at all Not difficult at all Not difficult at all -   Fall Risk  05/28/2018 01/26/2018 01/25/2018 01/08/2018 12/31/2017  Falls in the past year? 0 0 0 0 0  Number falls in past yr: 0 - - - 0  Injury with Fall? 0 - - - -   Relevant past medical, surgical, family and social history reviewed Past Medical History:  Diagnosis Date  . Anemia 08/14/2015  . Anxiety   . Aortic atherosclerosis ([place]) 11/25/2016   Chest CT Oct 2018  . Aorto-iliac atherosclerosis ([place]) 11/25/2016   Abd/pelvic CT Oct 2018  . Coronary artery calcification seen on CAT scan 11/25/2016   Chest CT Oct 2018; refer to Dr. Fletcher Anon  . DDD (degenerative disc disease), cervical 08/21/2016  . Depression   . Diabetes mellitus without complication ([place])   . Facet arthropathy, cervical 08/21/2016  . GERD (gastroesophageal reflux disease)   . Heart murmur    As child  . Hyperlipidemia   . Hypertension   . Hypertrophy of prostate with urinary obstruction 11/25/2016   Pelvic CT October 2018  . Incidental lung nodule, > 83mm and < 38mm 11/25/2016   5 mm nodule LLL, chest CT Oct 2018  .  Thrombocytosis ([place]) 08/14/2015   Past Surgical History:  Procedure Laterality Date  . ANTERIOR CERVICAL DECOMP/DISCECTOMY FUSION N/A 09/24/2016   Procedure: ANTERIOR CERVICAL DECOMPRESSION/DISCECTOMY FUSION 3 LEVELS-C4-7;  Surgeon: Meade Maw, MD;  Location: [facility] ORS;  Service: Neurosurgery;  Laterality: N/A;  . CATARACT EXTRACTION    . ELECTROMAGNETIC NAVIGATION BROCHOSCOPY Right 04/07/2018   Procedure: ELECTROMAGNETIC NAVIGATION BRONCHOSCOPY;  Surgeon: Flora Lipps, MD;  Location: [facility] ORS;  Service: Cardiopulmonary;  Laterality: Right;  . EYE SURGERY Right    Cataract Extraction with IOL  . RETINAL LASER PROCEDURE     Family History  Problem Relation Age of Onset  . Stroke Mother   . Diabetes Mellitus II Father   . Heart Problems Father   . Heart disease Father   . Diabetes Sister   . Cancer Maternal Grandfather   . Stroke Paternal Grandmother   . Prostate cancer Neg Hx   . Bladder Cancer Neg Hx   . Kidney cancer Neg Hx    Social History   Tobacco Use  . Smoking status: Former Smoker    Packs/day: 1.00    Years: 30.00    Pack years: 30.00    Types: Cigarettes    Last attempt to quit: 07/19/1999    Years since quitting: 18.8  . Smokeless tobacco: Never Used  Substance Use Topics  . Alcohol use: No    Alcohol/week: 0.0 standard drinks  . Drug use: No     [facility] Visit from 05/28/2018 in [facility]  AUDIT-C Score  0     Interim medical history since last visit reviewed. Allergies and medications reviewed  Review of Systems  Constitutional: Negative for fever.  Respiratory: Negative for cough.   Cardiovascular: Negative for chest pain and leg swelling.   Per HPI unless specifically indicated above     Objective:    BP 137/70   Pulse 66   Wt Readings from Last 3 Encounters:  04/07/18 129 lb (58.5 kg)  03/29/18 129 lb (58.5 kg)  03/19/18 131 lb 6.4 oz (59.6 kg)    Physical Exam Pulmonary:     [place]: No respiratory distress.   Neurological:     Mental Status: He is alert.  Psychiatric:        Speech: Speech is not rapid and pressured, delayed or slurred.       Assessment & Plan:   Problem List Items Addressed This Visit      Cardiovascular and Mediastinum   Aorto-iliac atherosclerosis ([place])   Essential hypertension, benign    Controlled; continue ARB      Coronary artery calcification seen on CAT scan    No chest pain;  keep LDL under 70; continue statin        Endocrine   Uncontrolled type 2 diabetes mellitus ([place]) (Chronic)    Managed by endo; last A1c 7.1; urine microalb:Cr UTD and normal; he'll see his eye doctor regularly; feet recently examined      Relevant Medications   metFORMIN (GLUCOPHAGE) 1000 MG tablet   Insulin Detemir (LEVEMIR FLEXTOUCH) 100 UNIT/ML Pen   Type 2 diabetes mellitus with hyperglycemia, with long-term current use of insulin (HCC)   Relevant Medications   metFORMIN (GLUCOPHAGE) 1000 MG tablet   Insulin Detemir (LEVEMIR FLEXTOUCH) 100 UNIT/ML Pen     Genitourinary   Hypertrophy of prostate with urinary obstruction    Managed by urologist, asymptomatic; on meds        Other   Medication monitoring encounter   Relevant Orders   COMPLETE METABOLIC PANEL WITH GFR   Incidental lung nodule, > 41mm and < 40mm    Managed by pulmonologist; recent bronch      Hyperlipidemia - Primary    Goal LDL less than 70, continue statin      Relevant Orders   Lipid panel    Other Visit Diagnoses    Diabetes mellitus without complication ([place])       managed by endo; this is NOT the correct diagnosis code (his dm is uncontrolled), but I cannot remove it because it is linked to a medicine   Relevant Medications   metFORMIN (GLUCOPHAGE) 1000 MG tablet   Insulin Detemir (LEVEMIR FLEXTOUCH) 100 UNIT/ML Pen       Follow up plan: Return in about 6 months (around 11/27/2018) for follow-up visit with Dr. Sanda Klein.  An after-visit summary was printed and given to the patient at  [place].  Please see the patient instructions which may contain other information and recommendations beyond what is mentioned above in the assessment and plan.  Meds ordered this encounter  Medications  . metFORMIN (GLUCOPHAGE) 1000 MG tablet    Sig: Take 0.5 tablets (500 mg total) by mouth daily with breakfast.  . Insulin Detemir (LEVEMIR FLEXTOUCH) 100 UNIT/ML Pen    Sig: Inject 30 Units into the skin daily after supper.    Increasing dose    Orders Placed This Encounter  Procedures  . Lipid panel  . COMPLETE METABOLIC PANEL WITH GFR

## 2018-05-28 NOTE — Assessment & Plan Note (Signed)
Goal LDL less than 70, continue statin

## 2018-05-28 NOTE — Telephone Encounter (Signed)
Please schedule patient for an appointment With whom: Dr. Sanda Klein When: 6 months How: In person Why: high cholesterol, HTN, DM Thank you

## 2018-05-28 NOTE — Assessment & Plan Note (Signed)
Managed by endo; last A1c 7.1; urine microalb:Cr UTD and normal; he'll see his eye doctor regularly; feet recently examined

## 2018-05-28 NOTE — Assessment & Plan Note (Signed)
No chest pain; keep LDL under 70; continue statin

## 2018-05-28 NOTE — Telephone Encounter (Signed)
Appt scheduled

## 2018-05-28 NOTE — Assessment & Plan Note (Signed)
Managed by urologist, asymptomatic; on meds

## 2018-05-31 ENCOUNTER — Telehealth: Payer: Self-pay | Admitting: Internal Medicine

## 2018-05-31 NOTE — Telephone Encounter (Signed)
Called and spoke to pt regarding upcoming appt for 06/02/18, as DK is not currently seeing pt's in office. Pt declined phone/vitrual visit.   Pt wished to reschedule appt to 08/05/18, as he would like to see DK face to face.  Advised pt to contact our office if he develops any new or worsen sx.  Nothing further is needed at this time.

## 2018-06-02 ENCOUNTER — Ambulatory Visit: Payer: Medicare Other | Admitting: Internal Medicine

## 2018-06-24 ENCOUNTER — Ambulatory Visit: Payer: Medicare Other | Admitting: Urology

## 2018-07-21 ENCOUNTER — Ambulatory Visit: Payer: Self-pay | Admitting: Family Medicine

## 2018-07-21 DIAGNOSIS — H34811 Central retinal vein occlusion, right eye, with macular edema: Secondary | ICD-10-CM | POA: Diagnosis not present

## 2018-07-21 DIAGNOSIS — H401132 Primary open-angle glaucoma, bilateral, moderate stage: Secondary | ICD-10-CM | POA: Diagnosis not present

## 2018-07-21 LAB — HM DIABETES EYE EXAM

## 2018-07-21 NOTE — Chronic Care Management (AMB) (Signed)
Chronic Care Management   Note  07/21/2018 Name: Garrett Yoder. MRN: [id number] DOB: 04/23/1945  Garrett Yoder. is a 73 y.o. year old male who is a primary care patient of Lada, Satira Anis, MD. I reached out to Garrett Yoder. by phone today in response to a referral sent by [name]'s health plan.    Garrett Yoder was given information about Chronic Care Management services today including:  1. CCM service includes personalized support from designated clinical staff supervised by his physician, including individualized plan of care and coordination with other care providers 2. 24/7 contact phone numbers for assistance for urgent and routine care needs. 3. Service will only be billed when office clinical staff spend 20 minutes or more in a month to coordinate care. 4. Only one practitioner may furnish and bill the service in a calendar month. 5. The patient may stop CCM services at any time (effective at the end of the month) by phone call to the office staff. 6. The patient will be responsible for cost sharing (co-pay) of up to 20% of the service fee (after annual deductible is met).  Patient did not agree to enrollment in care management services and does not wish to consider at this time.  Follow up plan: The patient has been provided with contact information for the chronic care management team and has been advised to call with any health related questions or concerns.   [place]  [email or web address]   ??[phone]

## 2018-08-04 ENCOUNTER — Telehealth: Payer: Self-pay | Admitting: Internal Medicine

## 2018-08-04 NOTE — Telephone Encounter (Signed)
Okay to proceed.  

## 2018-08-04 NOTE — Telephone Encounter (Signed)
Called patient for COVID-19 pre-screening for in office visit.  Have you recently traveled any where out of the local area in the last 2 weeks? Yes- [place], [place]  Have you been in close contact with a person diagnosed with COVID-19 within the last 2 weeks? No  Do you currently have any of the following symptoms? If so, when did they start? Cough     Diarrhea  Joint Pain Fever      Muscle Pain  Red eyes Shortness of breath   Abdominal pain Vomiting Loss of smell    Rash   Sore Throat Headache    Weakness  Bruising or bleeding   Okay to proceed with visit. (date)  / Needs to reschedule visit. (date)

## 2018-08-05 ENCOUNTER — Encounter: Payer: Self-pay | Admitting: Internal Medicine

## 2018-08-05 ENCOUNTER — Ambulatory Visit: Payer: Medicare Other | Admitting: Internal Medicine

## 2018-08-05 ENCOUNTER — Other Ambulatory Visit: Payer: Self-pay

## 2018-08-05 DIAGNOSIS — R918 Other nonspecific abnormal finding of lung field: Secondary | ICD-10-CM | POA: Diagnosis not present

## 2018-08-05 NOTE — Progress Notes (Signed)
   Name: Garrett Yoder. MRN: [id number] DOB: 1945/12/19     CONSULTATION DATE:  02/18/2018 REFERRING MD : Genevive Bi  CHIEF COMPLAINT: Abnormal CT chest  STUDIES:      12/14/2017 CT chest Independently reviewed by Me Right lower lobe groundglass opacification 17 x 6 mm  CT chest 2013 independently reviewed by me today Right lower lobe groundglass opacification approximately 6 mm  Patient is a former smoker quit in 2001 Smoked 1 pack a day for 30 years   HISTORY OF PRESENT ILLNESS: Follow-up abnormal CT chest Patient had slow-growing right lower lobe groundglass opacification over the last 6 years Patient underwent ENB February 2020 Biopsies showed reactive cells no malignancy she seen Patient has been doing well since then  Patient here for follow-up assessment Will need CT chest follow-up in 3 months to assess interval development of groundglass opacification  NO Signs of infection at this time NO Weight loss No shortness of breath or chest pain     Review of Systems:  Gen:  Denies  fever, sweats, chills weigh loss  HEENT: Denies blurred vision, double vision, ear pain, eye pain, hearing loss, nose bleeds, sore throat Cardiac:  No dizziness, chest pain or heaviness, chest tightness,edema, No JVD Resp:   No cough, -sputum production, -shortness of breath,-wheezing, -hemoptysis,  Gi: Denies swallowing difficulty, stomach pain, nausea or vomiting, diarrhea, constipation, bowel incontinence Gu:  Denies bladder incontinence, burning urine Ext:   Denies Joint pain, stiffness or swelling Skin: Denies  skin rash, easy bruising or bleeding or hives Endoc:  Denies polyuria, polydipsia , polyphagia or weight change Psych:   Denies depression, insomnia or hallucinations  Other:  All other systems negative    BP 120/70 (BP Location: Left Arm, Cuff Size: Normal)   Pulse 79   Temp (!) 97.3 F (36.3 C) (Temporal)   Ht 5\' 8"  (1.727 m)   Wt 136 lb 6.4 oz (61.9 kg)   SpO2 100%    BMI 20.74 kg/m   Physical Examination:   GENERAL:NAD, no fevers, chills, no weakness no fatigue HEAD: Normocephalic, atraumatic.  EYES: PERLA, EOMI No scleral icterus.  NECK: Supple. No thyromegaly.  No JVD.  PULMONARY: CTA B/L no wheezing, rhonchi, crackles CARDIOVASCULAR: S1 and S2. Regular rate and rhythm. No murmurs GASTROINTESTINAL: Soft, nontender, nondistended. Positive bowel sounds.  MUSCULOSKELETAL: No swelling, clubbing, or edema.  NEUROLOGIC: No gross focal neurological deficits. 5/5 strength all extremities SKIN: No ulceration, lesions, rashes, or cyanosis.  PSYCHIATRIC: Insight, judgment intact. -depression -anxiety ALL OTHER ROS ARE NEGATIVE             ASSESSMENT / PLAN: Abnormal CT chest with groundglass opacification in the right lower lobe Status post ENB did not show any type of malignant cells Patient will need CT chest in 3 months for interval assessment   Treesa Mccully Patricia Pesa, M.D.  [facility]  Medical Director [place] Director [facility] Cardio-Pulmonary Department

## 2018-08-05 NOTE — Patient Instructions (Signed)
Right lung Ground-glass opacity  Follow up CT chest in 3 months

## 2018-08-15 ENCOUNTER — Other Ambulatory Visit: Payer: Self-pay | Admitting: Family Medicine

## 2018-08-15 NOTE — Telephone Encounter (Signed)
Please set up follow-up appt around 11/27/18

## 2018-08-16 ENCOUNTER — Telehealth: Payer: Self-pay

## 2018-08-16 MED ORDER — FINASTERIDE 5 MG PO TABS: 5.0000 mg | ORAL_TABLET | Freq: Every day | ORAL | 0 refills | Status: DC

## 2018-08-16 NOTE — Telephone Encounter (Signed)
1 Refill for Proscar sent to pharmacy. Patient has appt on 08/19/18

## 2018-08-19 ENCOUNTER — Encounter: Payer: Self-pay | Admitting: Urology

## 2018-08-19 ENCOUNTER — Other Ambulatory Visit: Payer: Self-pay

## 2018-08-19 ENCOUNTER — Ambulatory Visit: Payer: Medicare Other | Admitting: Urology

## 2018-08-19 VITALS — BP 148/68 | HR 94 | Ht 68.0 in | Wt 135.8 lb

## 2018-08-19 DIAGNOSIS — N401 Enlarged prostate with lower urinary tract symptoms: Secondary | ICD-10-CM | POA: Diagnosis not present

## 2018-08-19 LAB — BLADDER SCAN AMB NON-IMAGING

## 2018-08-19 MED ORDER — FINASTERIDE 5 MG PO TABS: 5.0000 mg | ORAL_TABLET | Freq: Every day | ORAL | 3 refills | Status: DC

## 2018-08-19 MED ORDER — TAMSULOSIN HCL 0.4 MG PO CAPS: 0.8000 mg | ORAL_CAPSULE | Freq: Every day | ORAL | 0 refills | Status: DC

## 2018-08-19 NOTE — Progress Notes (Signed)
08/19/2018 2:31 PM   Garrett Yoder 03-08-1945 [id number]  Referring provider: [name], MD [address] [place],  [place] [address]  Chief Complaint  Patient presents with  . Benign Prostatic Hypertrophy    Urologic history: 1.  BPH with lower urinary tract symptoms  -Long-term tamsulosin with worsening symptoms frequency/urgency  -Finasteride added 12/2016 with marked improvement in symptoms  HPI: 73 year old male presents for annual follow-up.  At his visit last year he was doing very well on combination therapy however he states over the past several months he does note increasing urinary frequency.  He denies excesses and fluid intake.  He estimates he voids approximately 10 times per day.  He has nocturia x2.  Denies incontinence or significant urgency.  IPSS completed today was 13/3.  Denies dysuria, gross hematuria or flank/abdominal/pelvic/scrotal pain.   PMH: Past Medical History:  Diagnosis Date  . Anemia 08/14/2015  . Anxiety   . Aortic atherosclerosis ([place]) 11/25/2016   Chest CT Oct 2018  . Aorto-iliac atherosclerosis ([place]) 11/25/2016   Abd/pelvic CT Oct 2018  . Coronary artery calcification seen on CAT scan 11/25/2016   Chest CT Oct 2018; refer to Dr. Fletcher Anon  . DDD (degenerative disc disease), cervical 08/21/2016  . Depression   . Diabetes mellitus without complication ([place])   . Facet arthropathy, cervical 08/21/2016  . GERD (gastroesophageal reflux disease)   . Heart murmur    As child  . Hyperlipidemia   . Hypertension   . Hypertrophy of prostate with urinary obstruction 11/25/2016   Pelvic CT October 2018  . Incidental lung nodule, > 33m and < 850m10/10/2016   5 mm nodule LLL, chest CT Oct 2018  . [place]rombocytosis ([place]/27/2017    Surgical History: Past Surgical History:  Procedure Laterality Date  . ANTERIOR CERVICAL DECOMP/DISCECTOMY FUSION N/A 09/24/2016   Procedure: ANTERIOR CERVICAL DECOMPRESSION/DISCECTOMY FUSION 3  LEVELS-C4-7;  Surgeon: YaMeade MawMD;  Location: [facility] ORS;  Service: Neurosurgery;  Laterality: N/A;  . CATARACT EXTRACTION    . ELECTROMAGNETIC NAVIGATION BROCHOSCOPY Right 04/07/2018   Procedure: ELECTROMAGNETIC NAVIGATION BRONCHOSCOPY;  Surgeon: KaFlora LippsMD;  Location: [facility] ORS;  Service: Cardiopulmonary;  Laterality: Right;  . EYE SURGERY Right    Cataract Extraction with IOL  . RETINAL LASER PROCEDURE      Home Medications:  Allergies as of 08/19/2018      Reactions   Codeine Nausea Only   Other reaction(s): makes him sick to his stomach Other reaction(s): makes him sick to his stomach      Medication List       Accurate as of August 19, 2018  2:31 PM. If you have any questions, ask your nurse or doctor.        acetaminophen 500 MG tablet Commonly known as: TYLENOL Take 1,000 mg by mouth 2 (two) times daily as needed.   aspirin EC 81 MG tablet Take 81 mg by mouth daily.   atorvastatin 40 MG tablet Commonly known as: LIPITOR TAKE 1 TABLET BY MOUTH AT  BEDTIME FOR CHOLESTEROL   bimatoprost 0.01 % Soln Commonly known as: LUMIGAN Place 1 drop into both eyes at bedtime.   finasteride 5 MG tablet Commonly known as: PROSCAR Take 1 tablet (5 mg total) by mouth daily.   glucose blood test strip To match the OneTouch Ultra 2 device; Dx E11.9, insulin-dependent, LON 99 months; check fingerstick blood sugars four times a day, fluctuating, needs more frequent monitoring   Insulin Detemir  100 UNIT/ML Pen Commonly known as: Levemir FlexTouch Inject 30 Units into the skin daily after supper.   insulin lispro 100 UNIT/ML injection Commonly known as: HUMALOG Inject 8 Units into the skin 2 (two) times daily.   irbesartan 150 MG tablet Commonly known as: AVAPRO TAKE 1 TABLET BY MOUTH  DAILY   metFORMIN 1000 MG tablet Commonly known as: GLUCOPHAGE Take 0.5 tablets (500 mg total) by mouth daily with breakfast.   Multi-Vitamins Tabs Take 1 tablet by mouth 2 (two)  times daily.   ONE TOUCH LANCETS Misc Check fingerstick blood sugars four times a day; LON 99 months; type 2 diabetes on insulin E11.9, fluctuating sugars   ONE TOUCH ULTRA 2 w/Device Kit OneTouch Ultra 2 device; Dx E11.9, insulin-dependent, LON 99 months; check fingerstick blood sugars four times a day, fluctuating, needs more frequent monitoring   Pen Needles 31G X 6 MM Misc For use with pen device to inject insulin once a day; LON 99 months, E11.9, Z79.4   pioglitazone 15 MG tablet Commonly known as: ACTOS Take 15 mg by mouth daily.   Probiotic Daily Caps Take 1 capsule by mouth every evening.   Simbrinza 1-0.2 % Susp Generic drug: Brinzolamide-Brimonidine Place 1 drop into both eyes 3 (three) times daily.   tamsulosin 0.4 MG Caps capsule Commonly known as: FLOMAX TAKE 1 CAPSULE BY MOUTH  DAILY       Allergies:  Allergies  Allergen Reactions  . Codeine Nausea Only    Other reaction(s): makes him sick to his stomach Other reaction(s): makes him sick to his stomach    Family History: Family History  Problem Relation Age of Onset  . Stroke Mother   . Diabetes Mellitus II Father   . Heart Problems Father   . Heart disease Father   . Diabetes Sister   . Cancer Maternal Grandfather   . Stroke Paternal Grandmother   . Prostate cancer Neg Hx   . Bladder Cancer Neg Hx   . Kidney cancer Neg Hx     Social History:  reports that he quit smoking about 19 years ago. His smoking use included cigarettes. He has a 30.00 pack-year smoking history. He has never used smokeless tobacco. He reports that he does not drink alcohol or use drugs.  ROS: UROLOGY Frequent Urination?: Yes Hard to postpone urination?: No Burning/pain with urination?: No Get up at night to urinate?: No Leakage of urine?: No Urine stream starts and stops?: No Trouble starting stream?: No Do you have to strain to urinate?: No Blood in urine?: No Urinary tract infection?: No Sexually transmitted  disease?: No Injury to kidneys or bladder?: No Painful intercourse?: No Weak stream?: No Erection problems?: No Penile pain?: No  Gastrointestinal Nausea?: No Vomiting?: No Indigestion/heartburn?: No Diarrhea?: No Constipation?: No  Constitutional Fever: No Night sweats?: No Weight loss?: No Fatigue?: No  Skin Skin rash/lesions?: No Itching?: No  Eyes Blurred vision?: No Double vision?: No  Ears/Nose/Throat Sore throat?: No Sinus problems?: No  Hematologic/Lymphatic Swollen glands?: No Easy bruising?: No  Cardiovascular Leg swelling?: No Chest pain?: No  Respiratory Cough?: No Shortness of breath?: No  Endocrine Excessive thirst?: No  Musculoskeletal Back pain?: No Joint pain?: No  Neurological Headaches?: No Dizziness?: No  Psychologic Depression?: No Anxiety?: No  Physical Exam: BP (!) 148/68 (BP Location: Left Arm, Patient Position: Sitting, Cuff Size: Normal)   Pulse 94   Ht _0  (1.727 m)   Wt 135 lb 12.8 oz (61.6 kg)  BMI 20.65 kg/m   Constitutional:  Alert and oriented, No acute distress. HEENT: [place] AT, moist mucus membranes.  Trachea midline, no masses. Cardiovascular: No clubbing, cyanosis, or edema. Respiratory: Normal respiratory effort, no increased work of breathing. GI: Abdomen is soft, nontender, nondistended, no abdominal masses GU: No CVA tenderness.  Prostate 60 g, smooth without nodules Lymph: No cervical or inguinal lymphadenopathy. Skin: No rashes, bruises or suspicious lesions. Neurologic: Grossly intact, no focal deficits, moving all 4 extremities. Psychiatric: Normal mood and affect.   Assessment & Plan:   73 year old male with BPH and worsening lower urinary tract symptoms.  PVR by bladder scan today was 57 mL.  His most bothersome symptom is urinary frequency.  Options were discussed including increasing tamsulosin to 0.8 mg or adding Myrbetriq.  He has elected to increase the tamsulosin and will give a 30-day  trial.  He will call back in 1 month regarding efficacy.  Schedule tentative one-year follow-up.   Abbie Sons, [place] [address] [place], [place] [address] [phone]

## 2018-08-25 DIAGNOSIS — E1165 Type 2 diabetes mellitus with hyperglycemia: Secondary | ICD-10-CM | POA: Diagnosis not present

## 2018-08-25 DIAGNOSIS — Z794 Long term (current) use of insulin: Secondary | ICD-10-CM | POA: Diagnosis not present

## 2018-09-01 DIAGNOSIS — N183 Chronic kidney disease, stage 3 (moderate): Secondary | ICD-10-CM | POA: Diagnosis not present

## 2018-09-01 DIAGNOSIS — E1122 Type 2 diabetes mellitus with diabetic chronic kidney disease: Secondary | ICD-10-CM | POA: Diagnosis not present

## 2018-09-01 DIAGNOSIS — Z794 Long term (current) use of insulin: Secondary | ICD-10-CM | POA: Diagnosis not present

## 2018-09-01 DIAGNOSIS — E1165 Type 2 diabetes mellitus with hyperglycemia: Secondary | ICD-10-CM | POA: Diagnosis not present

## 2018-09-20 ENCOUNTER — Telehealth: Payer: Self-pay | Admitting: Urology

## 2018-09-20 NOTE — Telephone Encounter (Signed)
Please advise 

## 2018-09-20 NOTE — Telephone Encounter (Signed)
Pt called and states that he increased the tamsulosin as recommended by Dr Bernardo Heater and it has not worked for him. He asked if the other Medication that was recommended could be called in for him. Please advise.

## 2018-09-20 NOTE — Telephone Encounter (Signed)
He would like medicine called into [facility].

## 2018-09-21 NOTE — Telephone Encounter (Signed)
Trial Myrbetriq 50 mg-he can pick up 4 weeks of samples

## 2018-09-22 ENCOUNTER — Other Ambulatory Visit: Payer: Self-pay | Admitting: Family Medicine

## 2018-09-22 NOTE — Telephone Encounter (Signed)
Pt came by office samples given

## 2018-10-04 ENCOUNTER — Other Ambulatory Visit: Payer: Self-pay | Admitting: Family Medicine

## 2018-10-13 ENCOUNTER — Telehealth: Payer: Self-pay | Admitting: Urology

## 2018-10-13 MED ORDER — MIRABEGRON ER 50 MG PO TB24: 50.0000 mg | ORAL_TABLET | Freq: Every day | ORAL | 11 refills | Status: DC

## 2018-10-13 NOTE — Telephone Encounter (Signed)
RX sent to pharmacy  

## 2018-10-13 NOTE — Telephone Encounter (Signed)
Pt would like a new 30 day supply RX sent to [facility] for Myrbetriq 50 mg.

## 2018-11-07 ENCOUNTER — Other Ambulatory Visit: Payer: Self-pay | Admitting: Family Medicine

## 2018-11-08 ENCOUNTER — Other Ambulatory Visit: Payer: Self-pay

## 2018-11-08 ENCOUNTER — Ambulatory Visit
Admission: RE | Admit: 2018-11-08 | Discharge: 2018-11-08 | Disposition: A | Discharge status: Home / Self Care | Payer: Medicare Other | Source: Ambulatory Visit | Attending: Internal Medicine | Admitting: Internal Medicine

## 2018-11-08 DIAGNOSIS — R918 Other nonspecific abnormal finding of lung field: Secondary | ICD-10-CM | POA: Diagnosis not present

## 2018-11-08 DIAGNOSIS — R911 Solitary pulmonary nodule: Secondary | ICD-10-CM | POA: Diagnosis not present

## 2018-11-08 NOTE — Telephone Encounter (Signed)
Requested medication (s) are due for refill today: yes  Requested medication (s) are on the active medication list: yes  Last refill:  08/16/2018  Future visit scheduled: yes  Notes to clinic:  Review for refill   Requested Prescriptions  Pending Prescriptions Disp Refills   tamsulosin (FLOMAX) 0.4 MG CAPS capsule [Pharmacy Med Name: TAMSULOSIN  0.4MG   CAP] 90 capsule 3    Sig: TAKE 1 CAPSULE BY MOUTH  DAILY     Urology: Alpha-Adrenergic Blocker Failed - 11/07/2018 11:40 AM      Failed - Last BP in normal range    BP Readings from Last 1 Encounters:  08/19/18 (!) 148/68         Passed - Valid encounter within last 12 months    Recent Outpatient Visits          5 months ago Mixed hyperlipidemia   [place], Satira Anis, MD   9 months ago Incidental lung nodule, > 64mm and < 27mm   [place], Satira Anis, MD   10 months ago Abnormal chest CT   [facility] [place], Satira Anis, MD   11 months ago Hospital discharge follow-up   [place], NP   1 year ago Uncontrolled type 2 diabetes mellitus with hyperglycemia [facility])   [place], Satira Anis, MD      Future Appointments            In 4 days Flora Lipps, MD [place]   In 3 weeks Hubbard Hartshorn, [facility], [place]   In 9 months Schuyler, Ronda Fairly, [place]

## 2018-11-10 IMAGING — MR MR CERVICAL SPINE W/O CM
5 series · 39 of 48 positions shown · non-contrast
Comparison: Comparison made with prior CT from 05/09/2007.

CLINICAL DATA: Initial evaluation for bilateral shoulder vein
radiating into neck, left thumb pain with numbness and tingling.

EXAM:
MRI CERVICAL SPINE WITHOUT CONTRAST
TECHNIQUE: Multiplanar, multisequence MR imaging of the cervical spine was
performed. No intravenous contrast was administered.

[Series 4: T1 · sagittal · 3.0mm · 0.70mm/px · 8 of 15 slices shown]
[im 1/15]
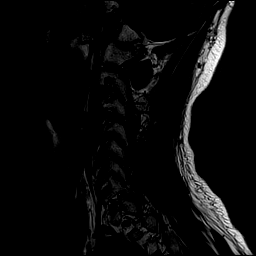
[im 3/15]
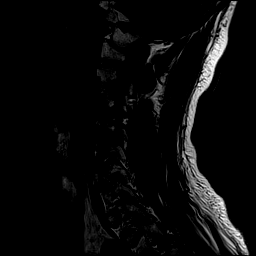
[im 5/15]
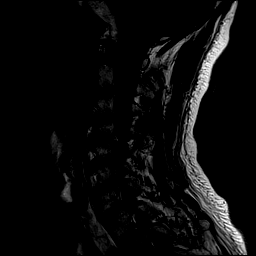
[im 7/15]
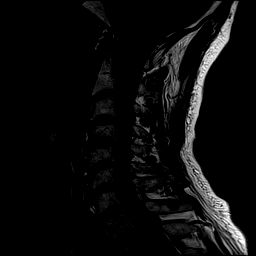
[im 9/15]
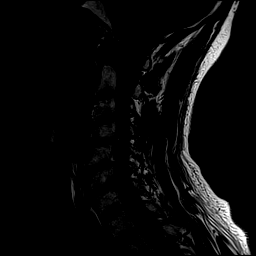
[im 11/15]
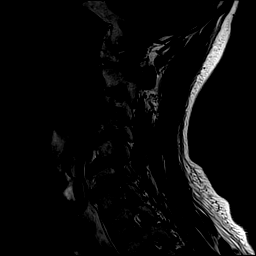
[im 13/15]
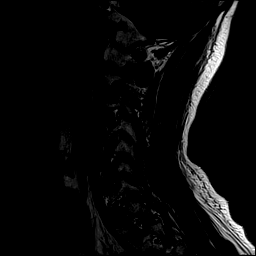
[im 15/15]
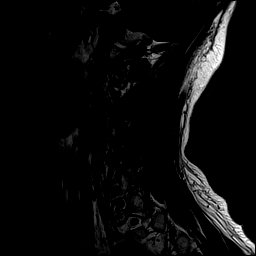

[Series 5: T2 · sagittal · 3.0mm · 0.70mm/px · 7 of 15 slices shown (1 of 2)]
[im 1/15]
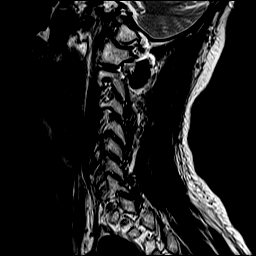
[im 3/15]
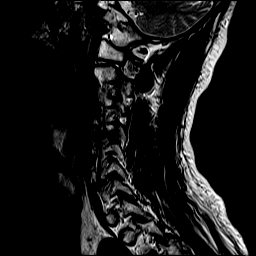
[im 5/15]
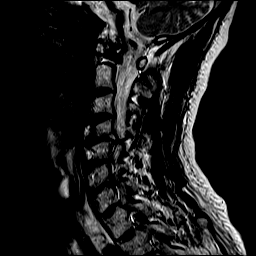
[im 8/15]
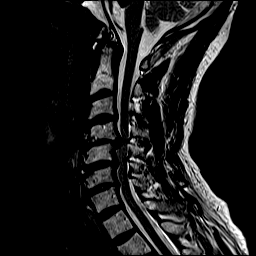
[im 10/15]
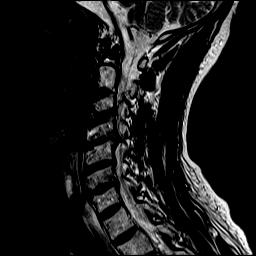
[im 12/15]
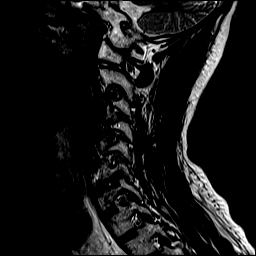
[im 15/15]
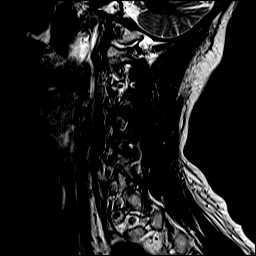

[Series 9: STIR · sagittal · 3.0mm · 0.70mm/px · 7 of 15 slices shown]
[im 1/15]
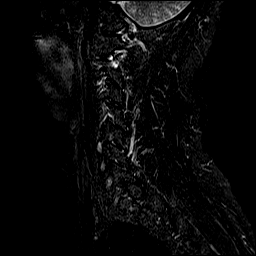
[im 3/15]
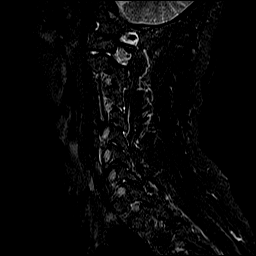
[im 5/15]
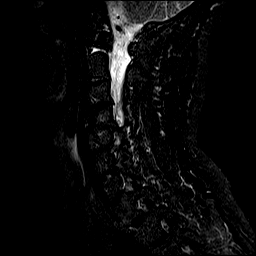
[im 8/15]
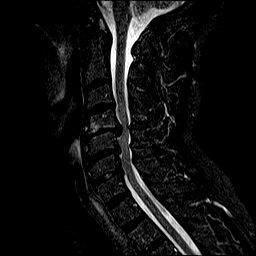
[im 10/15]
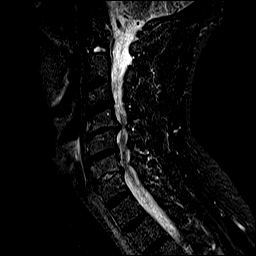
[im 12/15]
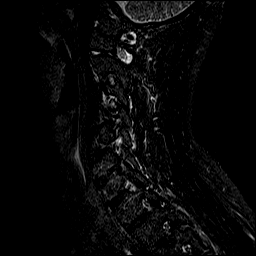
[im 15/15]
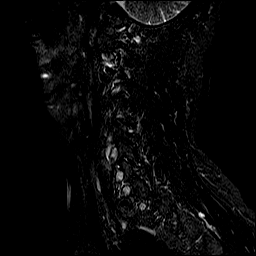

[Series 10: T2 · axial · 3.0mm · 0.70mm/px · z∈[-51,+48]mm · 9 of 27 slices shown (2 of 2)]
[im 1/27]
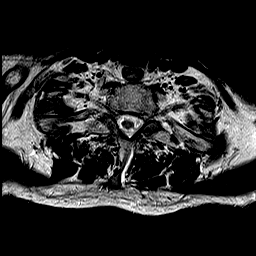
[im 5/27]
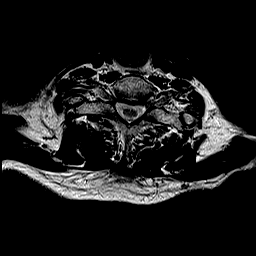
[im 9/27]
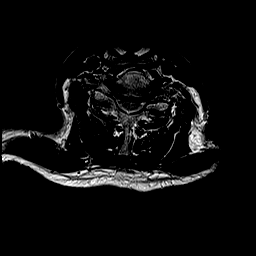
[im 11/27]
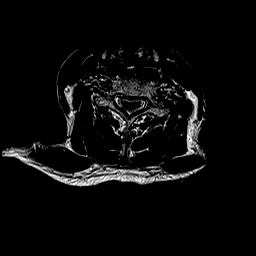
[im 14/27]
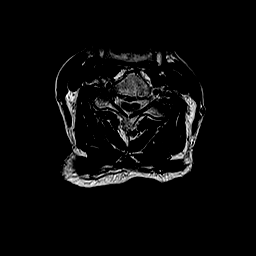
[im 16/27]
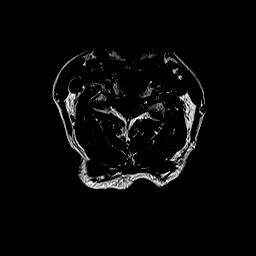
[im 18/27]
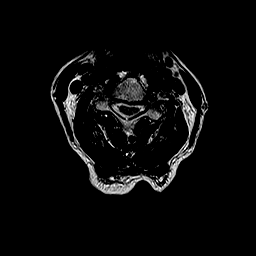
[im 22/27]
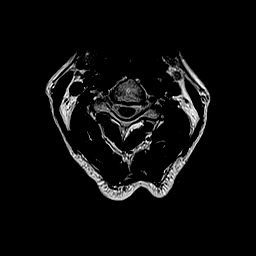
[im 27/27]
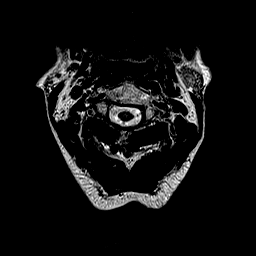

[Series 11: mpgr ax · axial · 3.0mm · 0.35mm/px · z∈[-51,+48]mm · 8 of 27 slices shown]
[im 1/27]
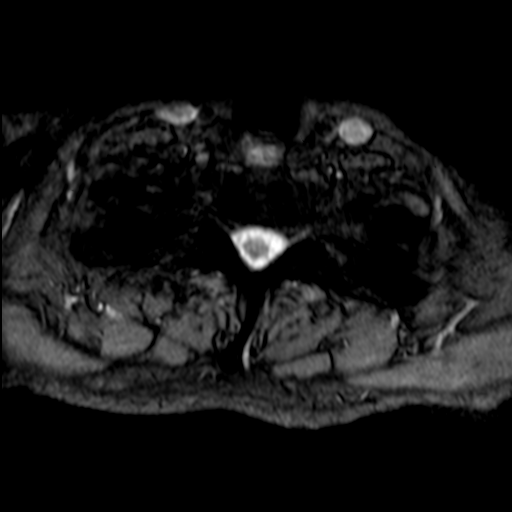
[im 5/27]
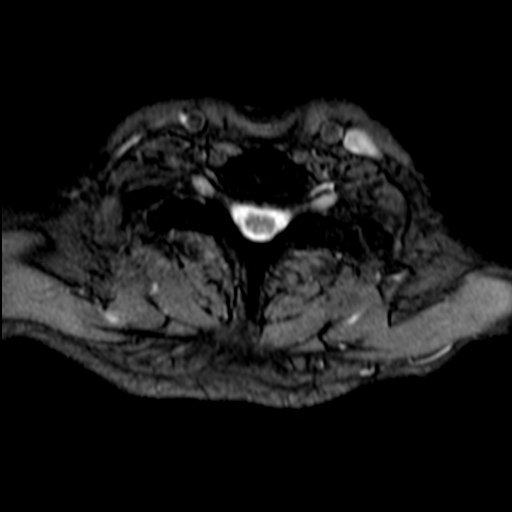
[im 9/27]
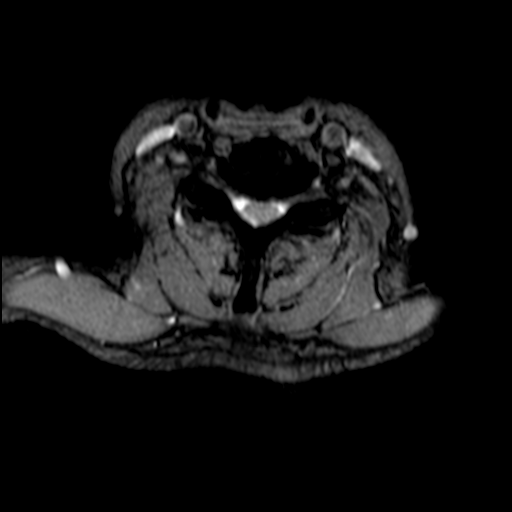
[im 11/27]
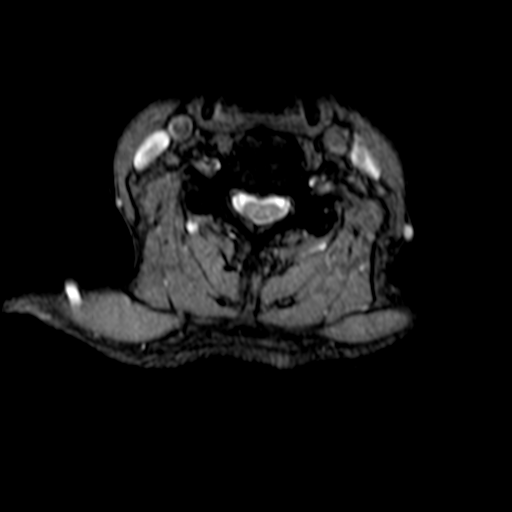
[im 16/27]
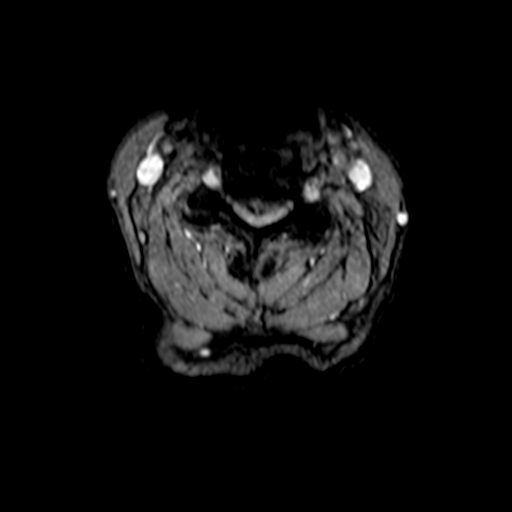
[im 18/27]
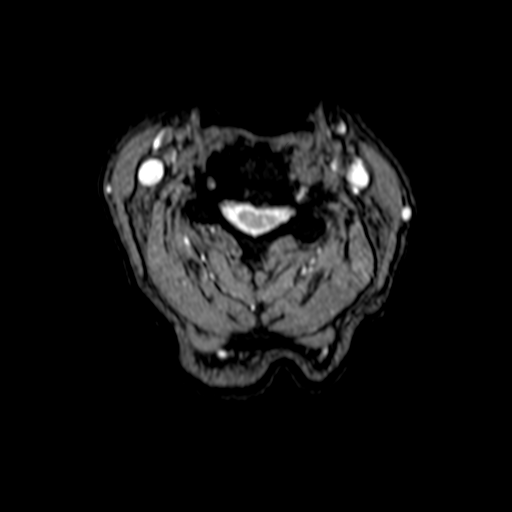
[im 22/27]
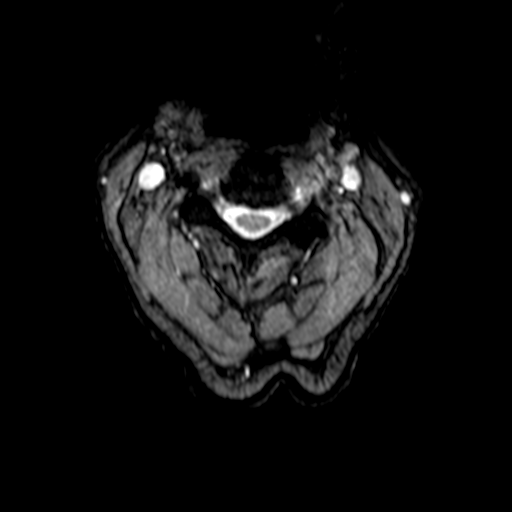
[im 27/27]
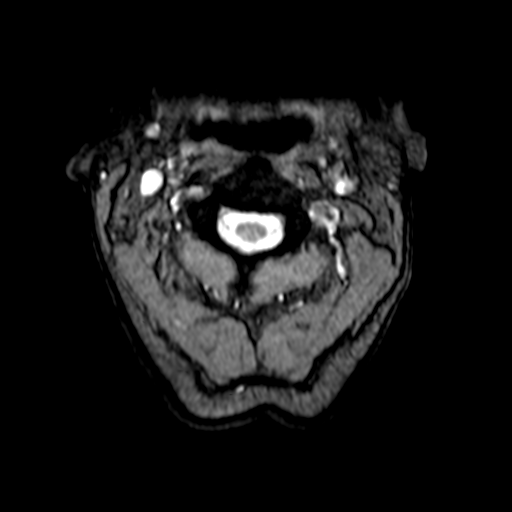

[39 of 48 positions shown; findings below may reference images not displayed]

FINDINGS: Alignment: Chronic trace retrolisthesis of C4 on C5, likely facet
mediated. Vertebral bodies otherwise normally aligned with
preservation of the normal cervical lordosis.

Vertebrae: Vertebral body heights are maintained. No evidence for
acute or chronic fracture. Signal intensity within the vertebral
body bone marrow within normal limits. No discrete or worrisome
osseous lesions. Mild reactive endplate changes present about the
C4-5 interspace.

Cord: Signal intensity within the cervical spinal cord is normal.

Posterior Fossa, vertebral arteries, paraspinal tissues: Visualized
brain and posterior fossa are within normal limits. Craniocervical
junction normal. Paraspinous and prevertebral soft tissues within
normal limits. Normal intravascular flow voids present within the
vertebral arteries bilaterally.

Disc levels:

C2-C3: Bilateral uncovertebral spurring, greater on the left. Mild
facet degeneration, also greater on the left. Resultant mild
bilateral C3 foraminal stenosis, left slightly worse than right. No
canal narrowing.

C3-C4: Shallow posterior disc bulge flattens and mildly indents the
ventral thecal sac. Mild left greater than right uncovertebral
hypertrophy. Mild facet degeneration. Resultant mild spinal
stenosis. No significant foraminal encroachment.

C4-C5: Chronic diffuse degenerative disc osteophyte with
intervertebral disc space narrowing and reactive endplate changes.
Superimposed large posterior disc osteophyte with disc protrusion
indents and effaces the ventral thecal sac (series 10, image 12).
Protruding disc flattens the cervical spinal cord without cord
signal changes. Thecal sac measures 4 mm in AP diameter at its most
narrow point. Severe canal stenosis. Superimposed uncovertebral
spurring results in severe bilateral C5 foraminal stenosis as well,
slightly worse on the right.

C5-C6: Diffuse degenerative disc osteophyte with intervertebral disc
space narrowing. Broad posterior component indents and partially
effaces the ventral thecal sac. Mild flattening of the cervical
spinal cord without cord signal changes. Moderate spinal stenosis.
Thecal sac measures 7 mm in AP diameter at its most narrow point.
Mild right C6 foraminal stenosis. No significant left foraminal
encroachment.

C6-C7: Diffuse degenerative disc osteophyte with intervertebral disc
space narrowing and bilateral uncovertebral spurring. Broad
posterior component flattens the ventral thecal sac. Superimposed
mild ligamentum flavum thickening. Resultant mild to moderate canal
stenosis. Moderate bilateral C7 foraminal stenosis.

C7-T1: Mild facet hypertrophy. Otherwise unremarkable without canal
or foraminal stenosis.

Visualized upper thoracic spine within normal limits.
IMPRESSION: 1. Large central disc osteophyte complex at C4-5 with resultant
severe canal stenosis and secondary flattening of the cervical
spinal cord without cord signal changes. Severe bilateral C5
foraminal stenosis at this level as well.
2. Degenerative spondylolysis at C5-6 and C6-7 with resultant mild
to moderate canal stenosis. Moderate bilateral C7 foraminal stenosis
present as well.
3. Additional more mild degenerative spondylolysis at C2-3 and C3-4
as above without significant stenosis.

## 2018-11-12 ENCOUNTER — Encounter: Payer: Self-pay | Admitting: Internal Medicine

## 2018-11-12 ENCOUNTER — Ambulatory Visit (INDEPENDENT_AMBULATORY_CARE_PROVIDER_SITE_OTHER): Payer: Medicare Other | Admitting: Internal Medicine

## 2018-11-12 ENCOUNTER — Other Ambulatory Visit: Payer: Self-pay

## 2018-11-12 DIAGNOSIS — R918 Other nonspecific abnormal finding of lung field: Secondary | ICD-10-CM

## 2018-11-12 NOTE — Progress Notes (Signed)
Name: Garrett Yoder. MRN: [id number] DOB: Jan 12, 1946     I connected with the patient by telephone enabled telemedicine visit and verified that I am speaking with the correct person using two identifiers.    I discussed the limitations, risks, security and privacy concerns of performing an evaluation and management service by telemedicine and the availability of in-person appointments. I also discussed with the patient that there may be a patient responsible charge related to this service. The patient expressed understanding and agreed to proceed.  PATIENT AGREES AND CONFIRMS -YES   Other persons participating in the visit and their role in the encounter: Patient, nursing  This visit type was conducted due to national recommendations for restrictions regarding the COVID-19 Pandemic (e.g. social distancing).  This format is felt to be most appropriate for this patient at this time.  All issues noted in this document were discussed and addressed.        CONSULTATION DATE:  11/12/2018 REFERRING MD : Genevive Bi  CHIEF COMPLAINT:  Follow up Abnormal CT chest RT lower lobe GGO  STUDIES:      12/14/2017 CT chest Independently reviewed by Me Right lower lobe groundglass opacification 17 x 6 mm  CT chest 2013 independently reviewed by me today Right lower lobe groundglass opacification approximately 6 mm  SYNOPSIS Follow-up abnormal CT chest Patient had slow-growing right lower lobe groundglass opacification over the last 6 years Patient underwent ENB February 2020 Biopsies showed reactive cells no malignancy  seen   Patient is a former smoker quit in 2001 Smoked 1 pack a day for 30 years   HISTORY OF PRESENT ILLNESS: Follow up CT chest 10/2018 No significant growth of RLLL GGO Findings discussed and relayed to patient Patient satisfied with findings    No evidence of heart failure at this time No evidence or signs of infection at this time No respiratory distress No  fevers, chills, nausea, vomiting, diarrhea No evidence of lower extremity edema No evidence hemoptysis    Review of Systems:  Gen:  Denies  fever, sweats, chills weight loss  HEENT: Denies blurred vision, double vision, ear pain, eye pain, hearing loss, nose bleeds, sore throat Cardiac:  No dizziness, chest pain or heaviness, chest tightness,edema, No JVD Resp:   No cough, -sputum production, -shortness of breath,-wheezing, -hemoptysis,  Gi: Denies swallowing difficulty, stomach pain, nausea or vomiting, diarrhea, constipation, bowel incontinence Gu:  Denies bladder incontinence, burning urine Ext:   Denies Joint pain, stiffness or swelling Skin: Denies  skin rash, easy bruising or bleeding or hives Endoc:  Denies polyuria, polydipsia , polyphagia or weight change Psych:   Denies depression, insomnia or hallucinations  Other:  All other systems negative    ASSESSMENT / PLAN:   ABNORMAL CT CHEST with GGO in RLL findings suggest benign etiology with inflammation Status post ENB did not show any type of malignant cells There have been no sign changes in the past 12 months with regard to CT scans  Due to previous smoking history, patient is at risk for malignancy and this will need to be followed  Will follow up CT chest in 1 year   COVID-19 EDUCATION: The signs and symptoms of COVID-19 were discussed with the patient and how to seek care for testing.  The importance of social distancing was discussed today. Hand Washing Techniques and avoid touching face was advised.  MEDICATION ADJUSTMENTS/LABS AND TESTS ORDERED: CT chest in 1 year   CURRENT MEDICATIONS REVIEWED AT LENGTH WITH PATIENT TODAY  Patient satisfied with Plan of action and management. All questions answered  Follow up in 1 year  Total Time Spent 22 mins  Maretta Bees Patricia Pesa, M.D.  [facility]  Medical Director [place] Director [facility] Cardio-Pulmonary  Department

## 2018-11-12 NOTE — Patient Instructions (Signed)
Follow-up CT chest 1 year 

## 2018-11-29 ENCOUNTER — Other Ambulatory Visit: Payer: Self-pay

## 2018-11-29 ENCOUNTER — Ambulatory Visit (INDEPENDENT_AMBULATORY_CARE_PROVIDER_SITE_OTHER): Payer: Medicare Other | Admitting: Family Medicine

## 2018-11-29 ENCOUNTER — Encounter: Payer: Self-pay | Admitting: Family Medicine

## 2018-11-29 VITALS — BP 130/72 | HR 86 | Temp 97.9°F | Resp 14 | Ht 68.0 in | Wt 137.8 lb

## 2018-11-29 DIAGNOSIS — E1165 Type 2 diabetes mellitus with hyperglycemia: Secondary | ICD-10-CM | POA: Diagnosis not present

## 2018-11-29 DIAGNOSIS — Z794 Long term (current) use of insulin: Secondary | ICD-10-CM

## 2018-11-29 DIAGNOSIS — I10 Essential (primary) hypertension: Secondary | ICD-10-CM

## 2018-11-29 DIAGNOSIS — I700 Atherosclerosis of aorta: Secondary | ICD-10-CM

## 2018-11-29 DIAGNOSIS — G959 Disease of spinal cord, unspecified: Secondary | ICD-10-CM

## 2018-11-29 DIAGNOSIS — H409 Unspecified glaucoma: Secondary | ICD-10-CM | POA: Insufficient documentation

## 2018-11-29 DIAGNOSIS — D75839 Thrombocytosis, unspecified: Secondary | ICD-10-CM

## 2018-11-29 DIAGNOSIS — N401 Enlarged prostate with lower urinary tract symptoms: Secondary | ICD-10-CM | POA: Insufficient documentation

## 2018-11-29 DIAGNOSIS — R918 Other nonspecific abnormal finding of lung field: Secondary | ICD-10-CM | POA: Insufficient documentation

## 2018-11-29 DIAGNOSIS — M47812 Spondylosis without myelopathy or radiculopathy, cervical region: Secondary | ICD-10-CM

## 2018-11-29 DIAGNOSIS — E782 Mixed hyperlipidemia: Secondary | ICD-10-CM | POA: Diagnosis not present

## 2018-11-29 DIAGNOSIS — D473 Essential (hemorrhagic) thrombocythemia: Secondary | ICD-10-CM

## 2018-11-29 DIAGNOSIS — D649 Anemia, unspecified: Secondary | ICD-10-CM

## 2018-11-29 DIAGNOSIS — Z23 Encounter for immunization: Secondary | ICD-10-CM

## 2018-11-29 DIAGNOSIS — M5030 Other cervical disc degeneration, unspecified cervical region: Secondary | ICD-10-CM

## 2018-11-29 DIAGNOSIS — R351 Nocturia: Secondary | ICD-10-CM

## 2018-11-29 NOTE — Progress Notes (Signed)
Name: Garrett Yoder.   MRN: [id number]    DOB: [birth year]   Date:11/29/2018       Progress Note  Subjective  Chief Complaint  Chief Complaint  Patient presents with   Hypertension    6 month follow up   Hyperlipidemia   Diabetes    HPI  DM: He has been seeing Dr. Gabriel Carina with endocrinology with last visit was July 2020.  Last A1C was 6.7%.  Taking Actos, Humalog 8 units BID and Levemir 30units at night, and metformin daily.   Tolerating medications well without concern.  Taking ARB, and statin and 79m ASA daily.  Denies polyuria, polydipsia, or polyphagia.   Glaucoma: Eye exam in UTD; last eye visit was May/June and is due next month.  Does not have DM retinopathy per his report.   HLD: Tolerating statin, no myalgias, chest pain, shortness of breath.  No concerns at this time.  HTN: Taking irbesartan; denies chest pain, shortness of breath, BLE edema; checks BP's at home and they have been WNL.    DDD/Cervical Myelopathy: Had disc surgery back in 2018, and has been doing well since then.  Hx Anemia/Thrombocytosis: Saw hematologist many years; had iron infusion several years ago. No further intervention was recommended for elevated platelets.  BPH: Seeing urology; nocturia is better. On Flomax and PRostar and myrbetriq.   Abnormal Chest CT: He is former smoker with 30 pack year history.  He went to see Dr. KMortimer Fries had bronchoscopy about a year ago and things were "fine"; having follow up CT scan annually now to monitor for stability of ground glass opacity in the RLL.  He declines labs today.  We will check at next visit.   Patient Active Problem List   Diagnosis Date Noted   Type 2 diabetes mellitus with hyperglycemia, with long-term current use of insulin ([place] 05/28/2018   Lung nodule    Exposure to cigarette smoke 03/09/2018   Hypoglycemia 12/06/2017   Uncontrolled type 2 diabetes mellitus ([place] 11/27/2016   Aortic atherosclerosis ([place] 11/25/2016    Coronary artery calcification seen on CAT scan 11/25/2016   Incidental lung nodule, > 375mand < 45m24m0/10/2016   Aorto-iliac atherosclerosis ([place]0/10/2016   Hypertrophy of prostate with urinary obstruction 11/25/2016   Abnormal computed tomography of bladder 11/25/2016   Cervical myelopathy ([place]8/09/2016   Neck pain, chronic 08/21/2016   DDD (degenerative disc disease), cervical 08/21/2016   Facet arthropathy, cervical 08/21/2016   De Quervain's tenosynovitis 08/21/2016   Vertigo 10/06/2015   Acid reflux 09/09/2015   Colon cancer screening 08/14/2015   Essential hypertension, benign 08/14/2015   Hyperlipidemia 08/14/2015   Medication monitoring encounter 08/14/2015   Anemia 08/14/2015   Thrombocytosis ([place]6/27/2017    Past Surgical History:  Procedure Laterality Date   ANTERIOR CERVICAL DECOMP/DISCECTOMY FUSION N/A 09/24/2016   Procedure: ANTERIOR CERVICAL DECOMPRESSION/DISCECTOMY FUSION 3 LEVELS-C4-7;  Surgeon: YarMeade MawD;  Location: [facility] ORS;  Service: Neurosurgery;  Laterality: N/A;   CATARACT EXTRACTION     ELECTROMAGNETIC NAVIGATION BROCHOSCOPY Right 04/07/2018   Procedure: ELECTROMAGNETIC NAVIGATION BRONCHOSCOPY;  Surgeon: KasFlora LippsD;  Location: [facility] ORS;  Service: Cardiopulmonary;  Laterality: Right;   EYE SURGERY Right    Cataract Extraction with IOL   RETINAL LASER PROCEDURE      Family History  Problem Relation Age of Onset   Stroke Mother    Diabetes Mellitus II Father    Heart Problems Father    Heart disease Father  Diabetes Sister    Cancer Maternal Grandfather    Stroke Paternal Grandmother    Prostate cancer Neg Hx    Bladder Cancer Neg Hx    Kidney cancer Neg Hx     Social History   Socioeconomic History   Marital status: Married    Spouse name: Not on file   Number of children: Not on file   Years of education: Not on file   Highest education level: Not on file  Occupational History     Occupation: retired  Scientist, product/process development strain: Not on file   Food insecurity    Worry: Not on file    Inability: Not on Lexicographer needs    Medical: Not on file    Non-medical: Not on file  Tobacco Use   Smoking status: Former Smoker    Packs/day: 1.00    Years: 30.00    Pack years: 30.00    Types: Cigarettes    Quit date: 07/19/1999    Years since quitting: 19.3   Smokeless tobacco: Never Used  Substance and Sexual Activity   Alcohol use: No    Alcohol/week: 0.0 standard drinks   Drug use: No   Sexual activity: Yes    Partners: Female  Lifestyle   Physical activity    Days per week: Not on file    Minutes per session: Not on file   Stress: Not on file  Relationships   Social connections    Talks on phone: Not on file    Gets together: Not on file    Attends religious service: Not on file    Active member of club or organization: Not on file    Attends meetings of clubs or organizations: Not on file    Relationship status: Not on file   Intimate partner violence    Fear of current or ex partner: Not on file    Emotionally abused: Not on file    Physically abused: Not on file    Forced sexual activity: Not on file  Other Topics Concern   Not on file  Social History Narrative   Not on file     Current Outpatient Medications:    acetaminophen (TYLENOL) 500 MG tablet, Take 1,000 mg by mouth 2 (two) times daily as needed. , Disp: , Rfl:    aspirin EC 81 MG tablet, Take 81 mg by mouth daily., Disp: , Rfl:    atorvastatin (LIPITOR) 40 MG tablet, TAKE 1 TABLET BY MOUTH AT  BEDTIME FOR CHOLESTEROL, Disp: 90 tablet, Rfl: 1   bimatoprost (LUMIGAN) 0.01 % SOLN, Place 1 drop into both eyes at bedtime. , Disp: , Rfl:    Blood Glucose Monitoring Suppl (ONE TOUCH ULTRA 2) w/Device KIT, OneTouch Ultra 2 device; Dx E11.9, insulin-dependent, LON 99 months; check fingerstick blood sugars four times a day, fluctuating, needs more  frequent monitoring, Disp: 1 each, Rfl: 0   finasteride (PROSCAR) 5 MG tablet, Take 1 tablet (5 mg total) by mouth daily., Disp: 90 tablet, Rfl: 3   glucose blood test strip, To match the OneTouch Ultra 2 device; Dx E11.9, insulin-dependent, LON 99 months; check fingerstick blood sugars four times a day, fluctuating, needs more frequent monitoring, Disp: 200 each, Rfl: 12   Insulin Detemir (LEVEMIR FLEXTOUCH) 100 UNIT/ML Pen, Inject 30 Units into the skin daily after supper., Disp: , Rfl:    insulin lispro (HUMALOG) 100 UNIT/ML injection, Inject 8 Units into the skin 2 (two)  times daily. , Disp: , Rfl:    Insulin Pen Needle (PEN NEEDLES) 31G X 6 MM MISC, For use with pen device to inject insulin once a day; LON 99 months, E11.9, Z79.4, Disp: 100 each, Rfl: 3   irbesartan (AVAPRO) 150 MG tablet, TAKE 1 TABLET BY MOUTH  DAILY, Disp: 90 tablet, Rfl: 3   metFORMIN (GLUCOPHAGE) 1000 MG tablet, Take 0.5 tablets (500 mg total) by mouth daily with breakfast., Disp: , Rfl:    mirabegron ER (MYRBETRIQ) 50 MG TB24 tablet, Take 1 tablet (50 mg total) by mouth daily., Disp: 30 tablet, Rfl: 11   Multiple Vitamin (MULTI-VITAMINS) TABS, Take 1 tablet by mouth 2 (two) times daily. , Disp: , Rfl:    ONE TOUCH LANCETS MISC, Check fingerstick blood sugars four times a day; LON 99 months; type 2 diabetes on insulin E11.9, fluctuating sugars, Disp: 200 each, Rfl: 11   pioglitazone (ACTOS) 15 MG tablet, Take 15 mg by mouth daily., Disp: , Rfl:    SIMBRINZA 1-0.2 % SUSP, Place 1 drop into both eyes 3 (three) times daily. , Disp: , Rfl:    tamsulosin (FLOMAX) 0.4 MG CAPS capsule, TAKE 1 CAPSULE BY MOUTH  DAILY, Disp: 90 capsule, Rfl: 3   Probiotic Product (PROBIOTIC DAILY) CAPS, Take 1 capsule by mouth every evening. , Disp: , Rfl:   Allergies  Allergen Reactions   Codeine Nausea Only    Other reaction(s): makes him sick to his stomach Other reaction(s): makes him sick to his stomach    I personally  reviewed active problem list, medication list, allergies, health maintenance, notes from last encounter, lab results with the patient/caregiver today.   ROS  Constitutional: Negative for fever or weight change.  Respiratory: Negative for cough and shortness of breath.   Cardiovascular: Negative for chest pain or palpitations.  Gastrointestinal: Negative for abdominal pain, no bowel changes.  Musculoskeletal: Negative for gait problem or joint swelling.  Skin: Negative for rash.  Neurological: Negative for dizziness or headache.  No other specific complaints in a complete review of systems (except as listed in HPI above).  Objective  Vitals:   11/29/18 1319  BP: 130/72  Pulse: 86  Resp: 14  Temp: 97.9 F (36.6 C)  TempSrc: Oral  SpO2: 93%  Weight: 137 lb 12.8 oz (62.5 kg)  Height: _0  (1.727 m)    Body mass index is 20.95 kg/m.  Physical Exam Constitutional: Patient appears well-developed and well-nourished. No distress.  HENT: Head: Normocephalic and atraumatic.  Eyes: Conjunctivae and EOM are normal. No scleral icterus.   Neck: Normal range of motion. Neck supple. No JVD present.  Cardiovascular: Normal rate, regular rhythm and normal heart sounds.  No murmur heard. No BLE edema. Pulmonary/Chest: Effort normal and breath sounds normal. No respiratory distress. Musculoskeletal: Normal range of motion, no joint effusions. No gross deformities Neurological: Pt is alert and oriented to person, place, and time. No cranial nerve deficit. Coordination, balance, strength, speech and gait are normal.  Skin: Skin is warm and dry. No rash noted. No erythema.  Psychiatric: Patient has a normal mood and affect. behavior is normal. Judgment and thought content normal.  No results found for this or any previous visit (from the past 72 hour(s)).   PHQ2/9: Depression screen [facility] 2/9 11/29/2018 05/28/2018 01/26/2018 12/31/2017 12/08/2017  Decreased Interest 0 0 0 0 0  Down,  Depressed, Hopeless 0 0 0 0 0  PHQ - 2 Score 0 0 0 0 0  Altered sleeping  0 0 0 0 0  Tired, decreased energy 0 0 0 0 0  Change in appetite 0 0 0 0 0  Feeling bad or failure about yourself  0 0 0 0 0  Trouble concentrating 0 0 0 0 0  Moving slowly or fidgety/restless 0 0 0 0 0  Suicidal thoughts 0 0 0 0 0  PHQ-9 Score 0 0 0 0 0  Difficult doing work/chores Not difficult at all Not difficult at all Not difficult at all Not difficult at all Not difficult at all   PHQ-2/9 Result is negative.    Fall Risk: Fall Risk  11/29/2018 05/28/2018 01/26/2018 01/25/2018 01/08/2018  Falls in the past year? 0 0 0 0 0  Number falls in past yr: 0 0 - - -  Injury with Fall? 0 0 - - -  Follow up Falls evaluation completed - - - -    Assessment & Plan  1. Type 2 diabetes mellitus with hyperglycemia, with long-term current use of insulin ([place]) - Does not want lab work done today due to time constraints, wants to wait until next visit; seeing endocrinology.  2. Aortic atherosclerosis ([place]) - Taking statin therapy  3. Mixed hyperlipidemia - Taking statin therapy  4. Glaucoma of both eyes, unspecified glaucoma type - Following up with opthalmology  5. Essential hypertension, benign - Taking ARB, at goal today; declines labs.  6. DDD (degenerative disc disease), cervical - Stable  7. Facet arthropathy, cervical - Stable  8. Cervical myelopathy (HCC) - Stable  9. Thrombocytosis ([place]) 10. Anemia, unspecified type - Declines labs today  11. Benign prostatic hyperplasia with nocturia - Seeing urology  12. Abnormal findings on diagnostic imaging of lung - Seeing pulmonology  13. Needs flu shot - Flu Vaccine QUAD High Dose(Fluad)

## 2019-02-10 ENCOUNTER — Other Ambulatory Visit: Payer: Self-pay | Admitting: Emergency Medicine

## 2019-02-14 ENCOUNTER — Telehealth: Payer: Self-pay

## 2019-02-14 NOTE — Telephone Encounter (Signed)
Chrystal - this was routed to me with no note - please let me know what patient is needing. Thanks!

## 2019-02-14 NOTE — Telephone Encounter (Signed)
Please disregard. Orders are pending for his Lipitor. It flagged after I started encounter.

## 2019-02-15 MED ORDER — ATORVASTATIN CALCIUM 40 MG PO TABS: ORAL_TABLET | ORAL | 1 refills | Status: DC

## 2019-03-03 ENCOUNTER — Other Ambulatory Visit: Payer: Self-pay | Admitting: Family Medicine

## 2019-03-03 DIAGNOSIS — E119 Type 2 diabetes mellitus without complications: Secondary | ICD-10-CM

## 2019-03-03 NOTE — Telephone Encounter (Signed)
Please let patient know he needs to obtain refills of diabetes medications from Dr. Gabriel Carina.  If he is completely out, I can provide a temporary refill.

## 2019-03-31 ENCOUNTER — Ambulatory Visit (INDEPENDENT_AMBULATORY_CARE_PROVIDER_SITE_OTHER): Payer: Medicare Other

## 2019-03-31 ENCOUNTER — Other Ambulatory Visit: Payer: Self-pay

## 2019-03-31 VITALS — BP 133/63 | HR 73 | Ht 68.0 in | Wt 135.0 lb

## 2019-03-31 DIAGNOSIS — Z0000 Encounter for general adult medical examination without abnormal findings: Secondary | ICD-10-CM | POA: Diagnosis not present

## 2019-03-31 NOTE — Patient Instructions (Signed)
Garrett Yoder , Thank you for taking time to come for your Medicare Wellness Visit. I appreciate your ongoing commitment to your health goals. Please review the following plan we discussed and let me know if I can assist you in the future.   Screening recommendations/referrals: Colonoscopy: done 09/05/12. Repeat in 2024. Recommended yearly ophthalmology/optometry visit for glaucoma screening and checkup Recommended yearly dental visit for hygiene and checkup  Vaccinations: Influenza vaccine: done 11/29/18 Pneumococcal vaccine: done 04/21/13 Tdap vaccine: due Shingles vaccine: Shingrix discussed. Please contact your pharmacy for coverage information.   Advanced directives: Advance directive discussed with you today. I have provided a copy for you to complete at home and have notarized. Once this is complete please bring a copy in to our office so we can scan it into your chart.  Conditions/risks identified: Recommend increasing physical activity to at least 3 days per week  Next appointment: Please follow up in one year for your Medicare Annual Wellness visit.    Preventive Care 80 Years and Older, Male Preventive care refers to lifestyle choices and visits with your health care provider that can promote health and wellness. What does preventive care include?  A yearly physical exam. This is also called an annual well check.  Dental exams once or twice a year.  Routine eye exams. Ask your health care provider how often you should have your eyes checked.  Personal lifestyle choices, including:  Daily care of your teeth and gums.  Regular physical activity.  Eating a healthy diet.  Avoiding tobacco and drug use.  Limiting alcohol use.  Practicing safe sex.  Taking low doses of aspirin every day.  Taking vitamin and mineral supplements as recommended by your health care provider. What happens during an annual well check? The services and screenings done by your health care  provider during your annual well check will depend on your age, overall health, lifestyle risk factors, and family history of disease. Counseling  Your health care provider may ask you questions about your:  Alcohol use.  Tobacco use.  Drug use.  Emotional well-being.  Home and relationship well-being.  Sexual activity.  Eating habits.  History of falls.  Memory and ability to understand (cognition).  Work and work Statistician. Screening  You may have the following tests or measurements:  Height, weight, and BMI.  Blood pressure.  Lipid and cholesterol levels. These may be checked every 5 years, or more frequently if you are over 73 years old.  Skin check.  Lung cancer screening. You may have this screening every year starting at age 73 if you have a 30-pack-year history of smoking and currently smoke or have quit within the past 15 years.  Fecal occult blood test (FOBT) of the stool. You may have this test every year starting at age 73.  Flexible sigmoidoscopy or colonoscopy. You may have a sigmoidoscopy every 5 years or a colonoscopy every 10 years starting at age 73.  Prostate cancer screening. Recommendations will vary depending on your family history and other risks.  Hepatitis C blood test.  Hepatitis B blood test.  Sexually transmitted disease (STD) testing.  Diabetes screening. This is done by checking your blood sugar (glucose) after you have not eaten for a while (fasting). You may have this done every 1-3 years.  Abdominal aortic aneurysm (AAA) screening. You may need this if you are a current or former smoker.  Osteoporosis. You may be screened starting at age 73 if you are at high risk. Talk  with your health care provider about your test results, treatment options, and if necessary, the need for more tests. Vaccines  Your health care provider may recommend certain vaccines, such as:  Influenza vaccine. This is recommended every year.  Tetanus,  diphtheria, and acellular pertussis (Tdap, Td) vaccine. You may need a Td booster every 10 years.  Zoster vaccine. You may need this after age 73.  Pneumococcal 13-valent conjugate (PCV13) vaccine. One dose is recommended after age 73.  Pneumococcal polysaccharide (PPSV23) vaccine. One dose is recommended after age 73. Talk to your health care provider about which screenings and vaccines you need and how often you need them. This information is not intended to replace advice given to you by your health care provider. Make sure you discuss any questions you have with your health care provider. Document Released: 03/02/2015 Document Revised: 10/24/2015 Document Reviewed: 12/05/2014 Elsevier Interactive Patient Education  2017 [place] Prevention in the Home Falls can cause injuries. They can happen to people of all ages. There are many things you can do to make your home safe and to help prevent falls. What can I do on the outside of my home?  Regularly fix the edges of walkways and driveways and fix any cracks.  Remove anything that might make you trip as you walk through a door, such as a raised step or threshold.  Trim any bushes or trees on the path to your home.  Use bright outdoor lighting.  Clear any walking paths of anything that might make someone trip, such as rocks or tools.  Regularly check to see if handrails are loose or broken. Make sure that both sides of any steps have handrails.  Any raised decks and porches should have guardrails on the edges.  Have any leaves, snow, or ice cleared regularly.  Use sand or salt on walking paths during winter.  Clean up any spills in your garage right away. This includes oil or grease spills. What can I do in the bathroom?  Use night lights.  Install grab bars by the toilet and in the tub and shower. Do not use towel bars as grab bars.  Use non-skid mats or decals in the tub or shower.  If you need to sit down in  the shower, use a plastic, non-slip stool.  Keep the floor dry. Clean up any water that spills on the floor as soon as it happens.  Remove soap buildup in the tub or shower regularly.  Attach bath mats securely with double-sided non-slip rug tape.  Do not have throw rugs and other things on the floor that can make you trip. What can I do in the bedroom?  Use night lights.  Make sure that you have a light by your bed that is easy to reach.  Do not use any sheets or blankets that are too big for your bed. They should not hang down onto the floor.  Have a firm chair that has side arms. You can use this for support while you get dressed.  Do not have throw rugs and other things on the floor that can make you trip. What can I do in the kitchen?  Clean up any spills right away.  Avoid walking on wet floors.  Keep items that you use a lot in easy-to-reach places.  If you need to reach something above you, use a strong step stool that has a grab bar.  Keep electrical cords out of the way.  Do  not use floor polish or wax that makes floors slippery. If you must use wax, use non-skid floor wax.  Do not have throw rugs and other things on the floor that can make you trip. What can I do with my stairs?  Do not leave any items on the stairs.  Make sure that there are handrails on both sides of the stairs and use them. Fix handrails that are broken or loose. Make sure that handrails are as long as the stairways.  Check any carpeting to make sure that it is firmly attached to the stairs. Fix any carpet that is loose or worn.  Avoid having throw rugs at the top or bottom of the stairs. If you do have throw rugs, attach them to the floor with carpet tape.  Make sure that you have a light switch at the top of the stairs and the bottom of the stairs. If you do not have them, ask someone to add them for you. What else can I do to help prevent falls?  Wear shoes that:  Do not have high  heels.  Have rubber bottoms.  Are comfortable and fit you well.  Are closed at the toe. Do not wear sandals.  If you use a stepladder:  Make sure that it is fully opened. Do not climb a closed stepladder.  Make sure that both sides of the stepladder are locked into place.  Ask someone to hold it for you, if possible.  Clearly mark and make sure that you can see:  Any grab bars or handrails.  First and last steps.  Where the edge of each step is.  Use tools that help you move around (mobility aids) if they are needed. These include:  Canes.  Walkers.  Scooters.  Crutches.  Turn on the lights when you go into a dark area. Replace any light bulbs as soon as they burn out.  Set up your furniture so you have a clear path. Avoid moving your furniture around.  If any of your floors are uneven, fix them.  If there are any pets around you, be aware of where they are.  Review your medicines with your doctor. Some medicines can make you feel dizzy. This can increase your chance of falling. Ask your doctor what other things that you can do to help prevent falls. This information is not intended to replace advice given to you by your health care provider. Make sure you discuss any questions you have with your health care provider. Document Released: 11/30/2008 Document Revised: 07/12/2015 Document Reviewed: 03/10/2014 Elsevier Interactive Patient Education  2017 [employer].

## 2019-03-31 NOTE — Progress Notes (Signed)
Subjective:   Garrett Yoder. is a 74 y.o. male who presents for an Initial Medicare Annual Wellness Visit.  Virtual Visit via Telephone Note  I connected with [name] on 03/31/19 at 11:20 AM EST by telephone and verified that I am speaking with the correct person using two identifiers.  Medicare Annual Wellness visit completed telephonically due to Covid-19 pandemic.   Location: Patient: home Provider: office   I discussed the limitations, risks, security and privacy concerns of performing an evaluation and management service by telephone and the availability of in person appointments. The patient expressed understanding and agreed to proceed.  Some vital signs may be absent or patient reported.   Clemetine Marker, LPN    Review of Systems   Cardiac Risk Factors include: advanced age (>15mn, >>77women);diabetes mellitus;dyslipidemia;male gender;hypertension    Objective:    Today's Vitals   03/31/19 1120  BP: 133/63  Pulse: 73  Weight: 135 lb (61.2 kg)  Height: _0  (1.727 m)   Body mass index is 20.53 kg/m.  Advanced Directives 03/31/2019 04/07/2018 03/29/2018 12/06/2017 12/06/2017 10/01/2017 04/02/2017  Does Patient Have a Medical Advance Directive? _1  No No  Would patient like information on creating a medical advance directive? Yes (MAU/Ambulatory/Procedural Areas - Information given) No - Patient declined No - Patient declined No - Patient declined - No - Patient declined No - Patient declined    Current Medications (verified) Outpatient Encounter Medications as of 03/31/2019  Medication Sig   acetaminophen (TYLENOL) 500 MG tablet Take 1,000 mg by mouth 2 (two) times daily as needed.    aspirin EC 81 MG tablet Take 81 mg by mouth daily.   atorvastatin (LIPITOR) 40 MG tablet TAKE 1 TABLET BY MOUTH AT  BEDTIME FOR CHOLESTEROL   bimatoprost (LUMIGAN) 0.01 % SOLN Place 1 drop into both eyes at bedtime.    Blood Glucose Monitoring Suppl  (ONE TOUCH ULTRA 2) w/Device KIT OneTouch Ultra 2 device; Dx E11.9, insulin-dependent, LON 99 months; check fingerstick blood sugars four times a day, fluctuating, needs more frequent monitoring   finasteride (PROSCAR) 5 MG tablet Take 1 tablet (5 mg total) by mouth daily.   glucose blood (ONETOUCH ULTRA) test strip USE TO TEST BLOOD SUGARS  THREE TIMES DAILY   Insulin Detemir (LEVEMIR FLEXTOUCH) 100 UNIT/ML Pen Inject 30 Units into the skin daily after supper.   insulin lispro (HUMALOG) 100 UNIT/ML KwikPen Take 8 units before breakfast. Take 12 units before the mid day meal.   Insulin Pen Needle (PEN NEEDLES) 31G X 6 MM MISC For use with pen device to inject insulin once a day; LON 99 months, E11.9, Z79.4   irbesartan (AVAPRO) 150 MG tablet TAKE 1 TABLET BY MOUTH  DAILY   metFORMIN (GLUCOPHAGE) 1000 MG tablet Take 0.5 tablets (500 mg total) by mouth daily with breakfast.   mirabegron ER (MYRBETRIQ) 50 MG TB24 tablet Take 1 tablet (50 mg total) by mouth daily.   Multiple Vitamin (MULTI-VITAMINS) TABS Take 1 tablet by mouth 2 (two) times daily.    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   SIMBRINZA 1-0.2 % SUSP Place 1 drop into both eyes 3 (three) times daily.    tamsulosin (FLOMAX) 0.4 MG CAPS capsule TAKE 1 CAPSULE BY MOUTH  DAILY   [  DISCONTINUED] glucose blood test strip To match the OneTouch Ultra 2 device; Dx E11.9, insulin-dependent, LON 99 months; check fingerstick blood sugars four times a day, fluctuating, needs more frequent monitoring   [DISCONTINUED] insulin lispro (HUMALOG) 100 UNIT/ML injection Inject 8 Units into the skin 2 (two) times daily.    [DISCONTINUED] Probiotic Product (PROBIOTIC DAILY) CAPS Take 1 capsule by mouth every evening.    No facility-administered encounter medications on file as of  03/31/2019.    Allergies (verified) Codeine   History: Past Medical History:  Diagnosis Date   Anemia 08/14/2015   Anxiety    Aortic atherosclerosis ([place]) 11/25/2016   Chest CT Oct 2018   Aorto-iliac atherosclerosis ([place]) 11/25/2016   Abd/pelvic CT Oct 2018   Coronary artery calcification seen on CAT scan 11/25/2016   Chest CT Oct 2018; refer to Dr. 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   GERD (gastroesophageal reflux disease)    Heart murmur    As child   Hyperlipidemia    Hypertension    Hypertrophy of prostate with urinary obstruction 11/25/2016   Pelvic CT October 2018   Incidental lung nodule, > 12m and < 815m10/10/2016   5 mm nodule LLL, chest CT Oct 2018   [place]rombocytosis [place]/27/2017   Past Surgical History:  Procedure Laterality Date   ANTERIOR CERVICAL DECOMP/DISCECTOMY FUSION N/A 09/24/2016   Procedure: ANTERIOR CERVICAL DECOMPRESSION/DISCECTOMY FUSION 3 LEVELS-C4-7;  Surgeon: YaMeade MawMD;  Location: [facility] ORS;  Service: Neurosurgery;  Laterality: N/A;   CATARACT EXTRACTION     ELECTROMAGNETIC NAVIGATION BROCHOSCOPY Right 04/07/2018   Procedure: ELECTROMAGNETIC NAVIGATION BRONCHOSCOPY;  Surgeon: KaFlora LippsMD;  Location: [facility] ORS;  Service: Cardiopulmonary;  Laterality: Right;   EYE SURGERY Right    Cataract Extraction with IOL   RETINAL LASER PROCEDURE     Family History  Problem Relation Age of Onset   Stroke Mother    Diabetes Mellitus II Father    Heart Problems Father    Heart disease Father    Diabetes Sister    Cancer Maternal Grandfather    Stroke Paternal Grandmother    Prostate cancer Neg Hx    Bladder Cancer Neg Hx    Kidney cancer Neg Hx    Social History   Socioeconomic History   Marital status: Married    Spouse name: Not on file   Number of children: Not on file   Years  of education: Not on file   Highest education level: Not on file  Occupational History   Occupation: retired  Tobacco Use   Smoking status: Former Smoker    Packs/day: 1.00    Years: 30.00    Pack years: 30.00    Types: Cigarettes    Quit date: 07/19/1999    Years since quitting: 19.7   Smokeless tobacco: Never Used  Substance and Sexual Activity   Alcohol use: No    Alcohol/week: 0.0 standard drinks   Drug use: No   Sexual activity: Yes    Partners: Female  Other Topics Concern   Not on file  Social History Narrative   Not on file   Social Determinants of Health   Financial Resource Strain: Low Risk    Difficulty of Paying Living Expenses: Not hard at all  Food Insecurity: No Food Insecurity   Worried About RuEstate manager/land agentf Food in the Last Year: Never true  Ran Out of Food in the Last Year: Never true  Transportation Needs: No Transportation Needs   Lack of Transportation (Medical): No   Lack of Transportation (Non-Medical): No  Physical Activity: Inactive   Days of Exercise per Week: 0 days   Minutes of Exercise per Session: 0 min  Stress: No Stress Concern Present   Feeling of Stress : Not at all  Social Connections: Unknown   Frequency of Communication with Friends and Family: Patient refused   Frequency of Social Gatherings with Friends and Family: Patient refused   Attends Religious Services: Patient refused   Marine scientist or Organizations: Patient refused   Attends Music therapist: Patient refused   Marital Status: Married   Tobacco Counseling Counseling given: Not Answered   Clinical Intake:  Pre-visit preparation completed: Yes  Pain : No/denies pain     BMI - recorded: 20.53 Nutritional Status: BMI of 19-24  Normal Nutritional Risks: None Diabetes: Yes CBG done?: No Did pt. bring in CBG monitor from home?: No  Nutrition Risk Assessment:  Has the patient had any N/V/D within the last 2 months?   No  Does the patient have any non-healing wounds?  No  Has the patient had any unintentional weight loss or weight gain?  No   Diabetes:  Is the patient diabetic?  Yes  If diabetic, was a CBG obtained today?  No  Did the patient bring in their glucometer from home?  No  How often do you monitor your CBG's? Twice daily .   Financial Strains and Diabetes Management:  Are you having any financial strains with the device, your supplies or your medication? No .  Does the patient want to be seen by Chronic Care Management for management of their diabetes?  No  Would the patient like to be referred to a Nutritionist or for Diabetic Management?  No   Diabetic Exams:  Diabetic Eye Exam: Completed 07/23/18 negative retinopathy.   Diabetic Foot Exam: Completed 03/09/19.   How often do you need to have someone help you when you read instructions, pamphlets, or other written materials from your doctor or pharmacy?: 1 - Never  Interpreter Needed?: No  Information entered by :: Clemetine Marker LPN  Activities of Daily Living In your present state of health, do you have any difficulty performing the following activities: 03/31/2019 05/28/2018  Hearing? Y N  Comment declines hearing aids -  Vision? N N  Difficulty concentrating or making decisions? N N  Walking or climbing stairs? N N  Dressing or bathing? N N  Doing errands, shopping? N N  Preparing Food and eating ? N -  Using the Toilet? N -  In the past six months, have you accidently leaked urine? N -  Do you have problems with loss of bowel control? N -  Managing your Medications? N -  Managing your Finances? N -  Housekeeping or managing your Housekeeping? N -  Some recent data might be hidden     Immunizations and Health Maintenance Immunization History  Administered Date(s) Administered   Fluad Quad(high Dose 65+) 11/29/2018   Influenza, High Dose Seasonal PF 11/20/2014, 01/17/2016, 11/25/2016, 01/26/2018   Influenza,inj,Quad  PF,6+ Mos 12/26/2013   Influenza-Unspecified 01/18/2008, 11/28/2008, 01/14/2010, 12/10/2010, 02/02/2012, 02/01/2013, 12/26/2013   Pneumococcal Conjugate-13 04/21/2013   Pneumococcal Polysaccharide-23 08/27/2011   Td 06/30/2006   Zoster 02/02/2012   Health Maintenance Due  Topic Date Due   Hepatitis C Screening  08-26-1945  Patient Care Team: Hubbard Hartshorn, FNP as PCP - General (Family Medicine) Vernon Prey, MD as Referring Physician (Ophthalmology) Meade Maw, MD as Consulting Physician (Neurosurgery)  Indicate any recent Medical Services you may have received from other than [facility] providers in the past year (date may be approximate).    Assessment:   This is a routine wellness examination for [place].  Hearing/Vision screen  Hearing Screening   _0  _1  _2  _3  _4  _5  _6  _7  _8   Right ear:           Left ear:           Comments: Pt reports mild hearing difficulty at times. Declines hearing aids   Vision Screening Comments: [facility] screenings [facility] Dr. Michelene Heady  Dietary issues and exercise activities discussed: Current Exercise Habits: The patient does not participate in regular exercise at present, Exercise limited by: None identified  Goals     Patient Stated     Pt states he would like to remain active and healthy over the next year      Depression Screen PHQ 2/9 Scores 03/31/2019 11/29/2018 05/28/2018 01/26/2018  PHQ - 2 Score 0 0 0 0  PHQ- 9 Score - 0 0 0    Fall Risk Fall Risk  03/31/2019 11/29/2018 05/28/2018 01/26/2018 01/25/2018  Falls in the past year? 0 0 0 0 0  Number falls in past yr: 0 0 0 - -  Injury with Fall? 0 0 0 - -  Risk for fall due to : No Fall Risks - - - -  Follow up Falls prevention discussed Falls evaluation completed - - -    FALL RISK PREVENTION PERTAINING TO THE HOME:  Any stairs in or around the home? Yes  If so, do they handrails? No  - a few steps  outside  Home free of loose throw rugs in walkways, pet beds, electrical cords, etc? Yes  Adequate lighting in your home to reduce risk of falls? Yes   ASSISTIVE DEVICES UTILIZED TO PREVENT FALLS:  Life alert? Yes  Use of a cane, walker or w/c? No  Grab bars in the bathroom? Yes  Shower chair or bench in shower? Yes  Elevated toilet seat or a handicapped toilet? No   DME ORDERS:  DME order needed?  No   TIMED UP AND GO:  Was the test performed? No . Telephonic visit.   Education: Fall risk prevention has been discussed.  Intervention(s) required? No    Cognitive Function:     6CIT Screen 03/31/2019  What Year? 0 points  What month? 0 points  What time? 0 points  Count back from 20 0 points  Months in reverse 0 points  Repeat phrase 0 points  Total Score 0    Screening Tests Health Maintenance  Topic Date Due   Hepatitis C Screening  [birth year]   TETANUS/TDAP  02/18/2020 (Originally 06/29/2016)   OPHTHALMOLOGY EXAM  07/21/2019   HEMOGLOBIN A1C  08/30/2019   FOOT EXAM  03/08/2020   COLONOSCOPY  09/06/2022   INFLUENZA VACCINE  Completed   PNA vac Low Risk Adult  Completed    Qualifies for Shingles Vaccine? Yes  Zostavax completed 2013. Due for Shingrix. Education has been provided regarding the importance of this vaccine. Pt has been advised to call insurance company to determine out of pocket expense. Advised may also receive vaccine at local pharmacy or Health Dept. Verbalized acceptance and understanding.  Tdap: Although this vaccine is  not a covered service during a Wellness Exam, does the patient still wish to receive this vaccine today?  No .  Education has been provided regarding the importance of this vaccine. Advised may receive this vaccine at local pharmacy or Health Dept. Aware to provide a copy of the vaccination record if obtained from local pharmacy or Health Dept. Verbalized acceptance and understanding.  Flu Vaccine: Up to  date  Pneumococcal Vaccine: Up to date    Cancer Screenings:  Colorectal Screening: Completed 09/05/12. Repeat every 10 years;   Lung Cancer Screening: (Low Dose CT Chest recommended if Age 53-80 years, 30 pack-year currently smoking OR have quit w/in 15years.) does not qualify.   Additional Screening:  Hepatitis C Screening: does qualify; postponed  Vision Screening: Recommended annual ophthalmology exams for early detection of glaucoma and other disorders of the eye. Is the patient up to date with their annual eye exam?  Yes  Who is the provider or what is the name of the office in which the pt attends annual eye exams? [place] Screening: Recommended annual dental exams for proper oral hygiene  Community Resource Referral:  CRR required this visit?  No       Plan:    I have personally reviewed and addressed the Medicare Annual Wellness questionnaire and have noted the following in the patients chart:  A. Medical and social history B. Use of alcohol, tobacco or illicit drugs  C. Current medications and supplements D. Functional ability and status E.  Nutritional status F.  Physical activity G. Advance directives H. List of other physicians I.  Hospitalizations, surgeries, and ER visits in previous 12 months J.  [place] such as hearing and vision if needed, cognitive and depression L. Referrals and appointments   In addition, I have reviewed and discussed with patient certain preventive protocols, quality metrics, and best practice recommendations. A written personalized care plan for preventive services as well as general preventive health recommendations were provided to patient.   Signed,  Clemetine Marker, LPN Nurse Health Advisor   Nurse Notes: pt scheduled for follow up with Dr. Roxan Hockey in April.

## 2019-04-25 NOTE — Telephone Encounter (Signed)
Patient got 1 year refills on 05/28/18 until 05/28/19

## 2019-05-30 ENCOUNTER — Ambulatory Visit: Payer: Medicare Other | Admitting: Family Medicine

## 2019-05-30 NOTE — Progress Notes (Deleted)
Patient is a 74 year old male patient of Garrett Yoder who was last seen in October for follow-up.  He also saw endocrine in January 2021 for follow-up. He returns today for follow-up.  Medical problems being followed include:  DM: He has been seeing Dr. Gabriel Carina with endocrinology with last visit was Jan, 2021.     At that time he noted checking his sugars 4 times daily Most recent labs: NA 138 03/02/2019  K 4.9 03/02/2019  CL 104 03/02/2019  CREATININE 1.4 (H) 03/02/2019  BUN 28 (H) 03/02/2019  CO2 29.7 03/02/2019  HGBA1C 7.5 (H) 03/02/2019   Taking pioglitazone, Humalog BID and Levemir 30units at night, and metformin daily.      Taking ARB, and statin and 81mg  ASA daily.   Denies polyuria, polydipsia, or polyphagia.  Endocrine in their A/P noted:  * Counseled him that diabetes is controlled.   * Continue Levemir as prescribed.   * Adjust Humalog to 8 units before breakfast and 12 units before lunch. No Humalog before supper.   To F/u with them again in 6 months   Glaucoma: Eye exam is UTD; last eye visit was in late 2020.  Does not have DM retinopathy per his report.   HLD: Tolerating statin,  no myalgias, chest pain, shortness of breath.   HTN: Taking irbesartan;  denies chest pain, shortness of breath, BLE edema;  checks BP's at home and they have been WNL.    CKD: Stage 3a  DDD/Cervical Myelopathy: Had disc surgery back in 2018, and has been doing well since then.  Hx Anemia/Thrombocytosis: Saw hematologist for many years; had iron infusion several years ago. No further intervention was recommended for elevated platelets.  BPH: Seeing urology; nocturia is better.  On Flomax and Proscar and myrbetriq.   Abnormal Chest CT: He is former smoker with 30 pack year history.  He went to see Dr. Mortimer Fries, had bronchoscopy about a year and a half ago and things were "fine"; having follow up CT scan annually now to monitor for stability of ground glass opacity in the RLL. Last  CT 10/2018:  IMPRESSION:   Overall, stable or improved ground-glass opacity and adjacent round-glass nodule in the right lower lobe.   He declines labs today.  We will check at next visit.

## 2019-05-31 ENCOUNTER — Ambulatory Visit: Payer: Medicare Other | Admitting: Internal Medicine

## 2019-06-06 NOTE — Progress Notes (Signed)
Patient ID: Garrett Yoder., male    DOB: 10-Mar-1945, 74 y.o.   MRN: [id number]  PCP: Hubbard Hartshorn, FNP  Chief Complaint  Patient presents with  . Follow-up  . Diabetes  . Hypertension  . Hyperlipidemia    Subjective:   Garrett Yoder. is a 74 y.o. male, presents to clinic with CC of the following:  Chief Complaint  Patient presents with  . Follow-up  . Diabetes  . Hypertension  . Hyperlipidemia    HPI:  Patient is a 74 y.o. male patient of Garrett Yoder who was last seen by Raquel Sarna on 11/29/2018 and follows up today. Patient declined labs on that visit and noted would recheck on his next follow-up.  He notes today that all in all he has been feeling good.  He notes his energy levels are not like they were years ago, although for a man of his age, doing well.  DM Patient followed by Dr. Gabriel Carina - endocrine with last visit 03/09/19 and A1c 7.5% then, and noted has Type 2 diabetes mellitus with stage 3a chronic kidney disease, with long-term current use of insulin (CMS-HCC) with the following plan/recommendations from that visit: Plan: * Counseled him that diabetes is controlled.  * Continue Levemir as prescribed.  * Adjust Humalog to 8 units before breakfast and 12 units before lunch. No Humalog before supper.  * Continue low dose metformin and pioglitazone.  * Check blood glucose 2 times daily.  * Encouraged his to follow a low carb diet.  * RTC for follow up in 6 months with labs prior. Also noted in his past was that higher doses of metformin caused GI upset and a DexCom CGM was ordered and insurer denied.   His blood sugars have been better controlled in the more recent past, with an average calculated of 20+ readings by himself noted to be 154. Taking ARB, a statin.   Denies increased thirst, any increased urinary frequency, no numbness/tingling in the ext's Eye exam in UTD; due in May,+ glaucoma, Does not have DM retinopathy per his report.  Lab Results    Component Value Date   HGBA1C 7.1 05/13/2018   HGBA1C 6.4 (H) 12/07/2017   HGBA1C 8.0 (H) 07/17/2017   Lab Results  Component Value Date   MICROALBUR 3.0 07/17/2017   LDLCALC 59 07/17/2017   CREATININE 1.25 (H) 03/29/2018  Had BMP 03/02/19 - BUN/Cr - 28/1.4, GFR - 50, lytes normal  HLD: Tolerating statin,  Last lipids at goal with LDL <70 Lab Results  Component Value Date   CHOL 129 07/17/2017   HDL 55 07/17/2017   LDLCALC 59 07/17/2017   TRIG 69 07/17/2017   CHOLHDL 2.3 07/17/2017    no myalgias, chest pain, shortness of breath.   HTN: Taking irbesartan;  denies chest pain, shortness of breath, BLE edema;  checks BP's at home and they have remained controlled- 120's/70  BP Readings from Last 3 Encounters:  06/07/19 (!) 110/50  03/31/19 133/63  11/29/18 130/72   DDD/Cervical Myelopathy: Had disc surgery back in 2018, and has been doing well since then.  Hx Anemia/Thrombocytosis: Saw hematologist many years; had iron infusion in past and a BM biopsy in Jan of 2019. Last visit with hem/onc Dr. Janese Banks in Aug of 2019 had the following A/P: 1.  Thrombocytosis: Platelet counts have been in the 500s since July 2018.  Bone marrow biopsy did not reveal any primary myeloproliferative disorder.  No evidence of MDS  was seen in the bone marrow.  Jak 2 CALR and MPL mutation testing was also negative.  His thrombocytosis is likely secondary although the etiology is definitively unclear.  Continue to monitor 2.  Normocytic anemia: Hemoglobin stable between 10-11 over the last 2 years.  Anemia work-up has been unremarkable so far.  Continue to monitor   A follow-up was rec'ed in 6 months and he called to cancel and no further f/u after was scheduled.  Denies any marked fatigue, unintentional weight loss. No black stools, no BRBPR, no other bleeding concerns,  Last colonoscopy - about 4 years ago per patient, all normal (cannot find record in Epic today) Wt Readings from Last 3 Encounters:   06/07/19 132 lb 14.4 oz (60.3 kg)  03/31/19 135 lb (61.2 kg)  11/29/18 137 lb 12.8 oz (62.5 kg)    Lab Results  Component Value Date   WBC 7.0 03/29/2018   HGB 11.1 (L) 03/29/2018   HCT 34.2 (L) 03/29/2018   MCV 93.7 03/29/2018   MCH 30.4 03/29/2018   RDW 13.0 03/29/2018   PLT 499 (H) 03/29/2018    BPH: Seeing urology; nocturia is better. On Flomax, Proscar and myrbetriq. Last visit was 08/2018 with yearly f/u rec'ed. He notes they do follow the PSAs for prostate cancer screening with urology. Lab Results  Component Value Date   PSA1 2.6 11/14/2016     Abnormal Chest CT: He is former smoker with 30 pack year history and saw Dr. Mortimer Fries, had bronchoscopy and having follow up CT scans annually now to monitor for stability of ground glass opacity in the RLL.His last one was in September 2020 Dr Zoila Shutter last note A/P: ABNORMAL CT CHEST with GGO in RLL findings suggest benign etiology with inflammation Status post ENB did not show any type of malignant cells There have been no sign changes in the past 12 months with regard to CT scans Due to previous smoking history, patient is at risk for malignancy and this will need to be followed Will follow up CT chest in 1 year   Aortic Atherosclerosis - noted on CT      Patient Active Problem List   Diagnosis Date Noted  . Glaucoma of both eyes 11/29/2018  . Benign prostatic hyperplasia with nocturia 11/29/2018  . Abnormal findings on diagnostic imaging of lung 11/29/2018  . Type 2 diabetes mellitus with hyperglycemia, with long-term current use of insulin ([place]) 05/28/2018  . Lung nodule   . Exposure to cigarette smoke 03/09/2018  . Uncontrolled type 2 diabetes mellitus ([place]) 11/27/2016  . Aortic atherosclerosis ([place]) 11/25/2016  . Coronary artery calcification seen on CAT scan 11/25/2016  . Incidental lung nodule, > 58m and < 852m10/10/2016  . Aorto-iliac atherosclerosis ([place]10/10/2016  . Hypertrophy of prostate with urinary  obstruction 11/25/2016  . Abnormal computed tomography of bladder 11/25/2016  . Cervical myelopathy ([place]08/09/2016  . Neck pain, chronic 08/21/2016  . DDD (degenerative disc disease), cervical 08/21/2016  . Facet arthropathy, cervical 08/21/2016  . De Quervain's tenosynovitis 08/21/2016  . Vertigo 10/06/2015  . Acid reflux 09/09/2015  . Essential hypertension, benign 08/14/2015  . Hyperlipidemia 08/14/2015  . Medication monitoring encounter 08/14/2015  . Anemia 08/14/2015  . Thrombocytosis ([facility]06/27/2017      Current Outpatient Medications:  .  acetaminophen (TYLENOL) 500 MG tablet, Take 1,000 mg by mouth 2 (two) times daily as needed. , Disp: , Rfl:  .  aspirin EC 81 MG tablet, Take 81 mg by mouth daily.,  Disp: , Rfl:  .  atorvastatin (LIPITOR) 40 MG tablet, TAKE 1 TABLET BY MOUTH AT  BEDTIME FOR CHOLESTEROL, Disp: 90 tablet, Rfl: 1 .  Blood Glucose Monitoring Suppl (ONE TOUCH ULTRA 2) w/Device KIT, OneTouch Ultra 2 device; Dx E11.9, insulin-dependent, LON 99 months; check fingerstick blood sugars four times a day, fluctuating, needs more frequent monitoring, Disp: 1 each, Rfl: 0 .  finasteride (PROSCAR) 5 MG tablet, Take 1 tablet (5 mg total) by mouth daily., Disp: 90 tablet, Rfl: 3 .  glucose blood (ONETOUCH ULTRA) test strip, USE TO TEST BLOOD SUGARS  THREE TIMES DAILY, Disp: , Rfl:  .  Insulin Detemir (LEVEMIR FLEXTOUCH) 100 UNIT/ML Pen, Inject 30 Units into the skin daily after supper., Disp: , Rfl:  .  insulin lispro (HUMALOG) 100 UNIT/ML KwikPen, Take 8 units before breakfast. Take 12 units before the mid day meal., Disp: , Rfl:  .  Insulin Pen Needle (PEN NEEDLES) 31G X 6 MM MISC, For use with pen device to inject insulin once a day; LON 99 months, E11.9, Z79.4, Disp: 100 each, Rfl: 3 .  irbesartan (AVAPRO) 150 MG tablet, TAKE 1 TABLET BY MOUTH  DAILY, Disp: 90 tablet, Rfl: 3 .  metFORMIN (GLUCOPHAGE) 1000 MG tablet, Take 0.5 tablets (500 mg total) by mouth daily with  breakfast., Disp: , Rfl:  .  Multiple Vitamin (MULTI-VITAMINS) TABS, Take 1 tablet by mouth 2 (two) times daily. , Disp: , Rfl:  .  ONE TOUCH LANCETS MISC, Check fingerstick blood sugars four times a day; LON 99 months; type 2 diabetes on insulin E11.9, fluctuating sugars, Disp: 200 each, Rfl: 11 .  pioglitazone (ACTOS) 15 MG tablet, Take 15 mg by mouth daily., Disp: , Rfl:  .  SIMBRINZA 1-0.2 % SUSP, Place 1 drop into both eyes 3 (three) times daily. , Disp: , Rfl:  .  tamsulosin (FLOMAX) 0.4 MG CAPS capsule, TAKE 1 CAPSULE BY MOUTH  DAILY, Disp: 90 capsule, Rfl: 3 .  bimatoprost (LUMIGAN) 0.01 % SOLN, Place 1 drop into both eyes at bedtime. , Disp: , Rfl:    Allergies  Allergen Reactions  . Codeine Nausea Only    Other reaction(s): makes him sick to his stomach Other reaction(s): makes him sick to his stomach     Past Surgical History:  Procedure Laterality Date  . ANTERIOR CERVICAL DECOMP/DISCECTOMY FUSION N/A 09/24/2016   Procedure: ANTERIOR CERVICAL DECOMPRESSION/DISCECTOMY FUSION 3 LEVELS-C4-7;  Surgeon: Meade Maw, MD;  Location: [facility] ORS;  Service: Neurosurgery;  Laterality: N/A;  . CATARACT EXTRACTION    . ELECTROMAGNETIC NAVIGATION BROCHOSCOPY Right 04/07/2018   Procedure: ELECTROMAGNETIC NAVIGATION BRONCHOSCOPY;  Surgeon: Flora Lipps, MD;  Location: [facility] ORS;  Service: Cardiopulmonary;  Laterality: Right;  . EYE SURGERY Right    Cataract Extraction with IOL  . RETINAL LASER PROCEDURE       Family History  Problem Relation Age of Onset  . Stroke Mother   . Diabetes Mellitus II Father   . Heart Problems Father   . Heart disease Father   . Diabetes Sister   . Cancer Maternal Grandfather   . Stroke Paternal Grandmother   . Prostate cancer Neg Hx   . Bladder Cancer Neg Hx   . Kidney cancer Neg Hx      Social History   Tobacco Use  . Smoking status: Former Smoker    Packs/day: 1.00    Years: 30.00    Pack years: 30.00    Types: Cigarettes    Quit date:  07/19/1999    Years since quitting: 19.8  . Smokeless tobacco: Never Used  Substance Use Topics  . Alcohol use: No    Alcohol/week: 0.0 standard drinks    With staff assistance, above reviewed with the patient today.  ROS: As per HPI, otherwise no specific complaints on a limited and focused system review   No results found for this or any previous visit (from the past 72 hour(s)).   PHQ2/9: Depression screen [facility] 2/9 06/07/2019 03/31/2019 11/29/2018 05/28/2018 01/26/2018  Decreased Interest 0 0 0 0 0  Down, Depressed, Hopeless 0 0 0 0 0  PHQ - 2 Score 0 0 0 0 0  Altered sleeping 0 - 0 0 0  Tired, decreased energy 0 - 0 0 0  Change in appetite 0 - 0 0 0  Feeling bad or failure about yourself  0 - 0 0 0  Trouble concentrating 0 - 0 0 0  Moving slowly or fidgety/restless 0 - 0 0 0  Suicidal thoughts 0 - 0 0 0  PHQ-9 Score 0 - 0 0 0  Difficult doing work/chores Not difficult at all - Not difficult at all Not difficult at all Not difficult at all  Some recent data might be hidden   PHQ-2/9 Result is neg  Fall Risk: Fall Risk  06/07/2019 03/31/2019 11/29/2018 05/28/2018 01/26/2018  Falls in the past year? 0 0 0 0 0  Number falls in past yr: 0 0 0 0 -  Injury with Fall? 0 0 0 0 -  Risk for fall due to : - No Fall Risks - - -  Follow up - Falls prevention discussed Falls evaluation completed - -      Objective:   Vitals:   06/07/19 1054  BP: (!) 110/50  Pulse: 81  Resp: 16  Temp: 97.6 F (36.4 C)  TempSrc: Temporal  SpO2: 99%  Weight: 132 lb 14.4 oz (60.3 kg)  Height: 5' 8"  (1.727 m)    Body mass index is 20.21 kg/m.  Physical Exam   NAD, masked, pleasant, slightly pale appearing HEENT - [place]/AT, sclera anicteric, PERRL, EOMI, conj - non-inj'ed, TM's and canals clear, pharynx clear Neck - supple, no adenopathy, no TM, carotids 2+ and = without bruits bilat Car - RRR without m/g/r Pulm- RR and effort normal at rest, CTA without wheeze or rales Abd - soft, NT, ND, BS+,   no masses, no hepatosplenomegaly Back - no CVA tenderness Skin- no rash noted on exposed areas, Ext - no LE edema, no active joints Neuro/psychiatric - affect was not flat, appropriate with conversation  Alert and oriented  Grossly non-focal - good strength on testing extremities, sensation intact to LT in distal extremities, DTRs 2+ and equal in the patella, Romberg was negative, no pronator drift, good finger-to-nose, good balance on 1 foot, gait normal with good tandem walk  Speech normal       Assessment & Plan:   1. Type 2 diabetes mellitus with hyperglycemia, with long-term current use of insulin (HCC) Sees endocrine presently.  They note has been well controlled, and the patient does think his A1c will be even further reduced on neck follow-up with endocrine.  2. Essential hypertension, benign Blood pressures remain well controlled at home, a little lower reading today at his visit, and he will continue to follow blood pressures at home with checks.  3. Mixed hyperlipidemia We will check a lipid panel today, he is on a statin presently. Has been at goal previously,  which is an LDL less than 70 - Lipid panel  4. Aortic atherosclerosis ([place]) On a statin  5. Thrombocytosis ([place]) 6. Anemia, unspecified type He has not followed up with Dr. Janese Banks since 2019, and do feel it will be helpful to have her continued involvement presently as well. We will recheck his counts today. He will call her office, and get a scheduled follow-up visit. Discussed potentially repeating a scoping procedure in the near future, although he has had no concerning symptoms in that regard, and he did note the last time he had one it was normal.  Have not been able to find that result to date in epic Await follow-up labs and then follow-up with Dr. Janese Banks - CBC with Differential/Platelet  7. Hypertrophy of prostate with urinary obstruction Continue with urology input, and he has an appointment scheduled for  this coming July  8. Abnormal findings on diagnostic imaging of lung Continue with follow-up with pulmonary, with a repeat scan planned in September.  9. DDD (degenerative disc disease), cervical Has been stable  Tentatively scheduled a follow-up visit in 6 months time, follow-up sooner as needed and await lab results ordered today. Also, labs are planned prior to his follow-up with endocrine in the next couple months.    Towanda Malkin, MD 06/07/19 11:11 AM

## 2019-06-07 ENCOUNTER — Other Ambulatory Visit: Payer: Self-pay

## 2019-06-07 ENCOUNTER — Ambulatory Visit (INDEPENDENT_AMBULATORY_CARE_PROVIDER_SITE_OTHER): Payer: Medicare Other | Admitting: Internal Medicine

## 2019-06-07 ENCOUNTER — Encounter: Payer: Self-pay | Admitting: Internal Medicine

## 2019-06-07 VITALS — BP 110/50 | HR 81 | Temp 97.6°F | Resp 16 | Ht 68.0 in | Wt 132.9 lb

## 2019-06-07 DIAGNOSIS — Z794 Long term (current) use of insulin: Secondary | ICD-10-CM

## 2019-06-07 DIAGNOSIS — I700 Atherosclerosis of aorta: Secondary | ICD-10-CM

## 2019-06-07 DIAGNOSIS — R918 Other nonspecific abnormal finding of lung field: Secondary | ICD-10-CM

## 2019-06-07 DIAGNOSIS — E782 Mixed hyperlipidemia: Secondary | ICD-10-CM | POA: Diagnosis not present

## 2019-06-07 DIAGNOSIS — I10 Essential (primary) hypertension: Secondary | ICD-10-CM | POA: Diagnosis not present

## 2019-06-07 DIAGNOSIS — D75839 Thrombocytosis, unspecified: Secondary | ICD-10-CM

## 2019-06-07 DIAGNOSIS — N138 Other obstructive and reflux uropathy: Secondary | ICD-10-CM

## 2019-06-07 DIAGNOSIS — D649 Anemia, unspecified: Secondary | ICD-10-CM

## 2019-06-07 DIAGNOSIS — M5030 Other cervical disc degeneration, unspecified cervical region: Secondary | ICD-10-CM

## 2019-06-07 DIAGNOSIS — E1165 Type 2 diabetes mellitus with hyperglycemia: Secondary | ICD-10-CM

## 2019-06-07 DIAGNOSIS — N401 Enlarged prostate with lower urinary tract symptoms: Secondary | ICD-10-CM

## 2019-06-07 DIAGNOSIS — D473 Essential (hemorrhagic) thrombocythemia: Secondary | ICD-10-CM

## 2019-06-07 LAB — LIPID PANEL
Cholesterol: 148 mg/dL (ref <200)
HDL: 47 mg/dL (ref >=40)
LDL Cholesterol (Calc): 71 mg/dL (calc)
Non-HDL Cholesterol (Calc): 101 mg/dL (calc) (ref <130)
Total CHOL/HDL Ratio: 3.1 (calc) (ref <5.0)
Triglycerides: 210 mg/dL — ABNORMAL HIGH (ref <150)

## 2019-06-07 LAB — CBC WITH DIFFERENTIAL/PLATELET
Absolute Monocytes: 760 cells/uL (ref 200–950)
Basophils Absolute: 53 cells/uL (ref 0–200)
Basophils Relative: 0.7 %
Eosinophils Absolute: 68 cells/uL (ref 15–500)
Eosinophils Relative: 0.9 %
HCT: 34.9 % — ABNORMAL LOW (ref 38.5–50.0)
Hemoglobin: 11.6 g/dL — ABNORMAL LOW (ref 13.2–17.1)
Lymphs Abs: 1459 cells/uL (ref 850–3900)
MCH: 31.4 pg (ref 27.0–33.0)
MCHC: 33.2 g/dL (ref 32.0–36.0)
MCV: 94.3 fL (ref 80.0–100.0)
MPV: 8.7 fL (ref 7.5–12.5)
Monocytes Relative: 10 %
Neutro Abs: 5259 cells/uL (ref 1500–7800)
Neutrophils Relative %: 69.2 %
Platelets: 467 10*3/uL — ABNORMAL HIGH (ref 140–400)
RBC: 3.7 10*6/uL — ABNORMAL LOW (ref 4.20–5.80)
RDW: 11.9 % (ref 11.0–15.0)
Total Lymphocyte: 19.2 %
WBC: 7.6 10*3/uL (ref 3.8–10.8)

## 2019-07-20 ENCOUNTER — Other Ambulatory Visit: Payer: Self-pay | Admitting: *Deleted

## 2019-07-20 DIAGNOSIS — D649 Anemia, unspecified: Secondary | ICD-10-CM

## 2019-07-21 ENCOUNTER — Other Ambulatory Visit: Payer: Self-pay

## 2019-07-21 ENCOUNTER — Inpatient Hospital Stay: Payer: Medicare Other | Attending: Oncology | Admitting: Oncology

## 2019-07-21 ENCOUNTER — Encounter: Payer: Self-pay | Admitting: Oncology

## 2019-07-21 ENCOUNTER — Inpatient Hospital Stay: Payer: Medicare Other

## 2019-07-21 VITALS — BP 132/57 | HR 94 | Temp 98.4°F | Resp 18 | Wt 131.5 lb

## 2019-07-21 DIAGNOSIS — D649 Anemia, unspecified: Secondary | ICD-10-CM | POA: Diagnosis not present

## 2019-07-21 DIAGNOSIS — D473 Essential (hemorrhagic) thrombocythemia: Secondary | ICD-10-CM | POA: Diagnosis not present

## 2019-07-21 DIAGNOSIS — D75839 Thrombocytosis, unspecified: Secondary | ICD-10-CM

## 2019-07-21 DIAGNOSIS — R5383 Other fatigue: Secondary | ICD-10-CM | POA: Diagnosis not present

## 2019-07-21 LAB — CBC WITH DIFFERENTIAL/PLATELET
Abs Immature Granulocytes: 0.06 10*3/uL (ref 0.00–0.07)
Basophils Absolute: 0 10*3/uL (ref 0.0–0.1)
Basophils Relative: 1 %
Eosinophils Absolute: 0.1 10*3/uL (ref 0.0–0.5)
Eosinophils Relative: 1 %
HCT: 36.4 % — ABNORMAL LOW (ref 39.0–52.0)
Hemoglobin: 12 g/dL — ABNORMAL LOW (ref 13.0–17.0)
Immature Granulocytes: 1 %
Lymphocytes Relative: 18 %
Lymphs Abs: 1.3 10*3/uL (ref 0.7–4.0)
MCH: 31.5 pg (ref 26.0–34.0)
MCHC: 33 g/dL (ref 30.0–36.0)
MCV: 95.5 fL (ref 80.0–100.0)
Monocytes Absolute: 0.5 10*3/uL (ref 0.1–1.0)
Monocytes Relative: 7 %
Neutro Abs: 5.4 10*3/uL (ref 1.7–7.7)
Neutrophils Relative %: 72 %
Platelets: 465 10*3/uL — ABNORMAL HIGH (ref 150–400)
RBC: 3.81 MIL/uL — ABNORMAL LOW (ref 4.22–5.81)
RDW: 12.7 % (ref 11.5–15.5)
WBC: 7.3 10*3/uL (ref 4.0–10.5)
nRBC: 0 % (ref 0.0–0.2)

## 2019-07-21 LAB — COMPREHENSIVE METABOLIC PANEL
ALT: 21 U/L (ref 0–44)
AST: 18 U/L (ref 15–41)
Albumin: 5 g/dL (ref 3.5–5.0)
Alkaline Phosphatase: 65 U/L (ref 38–126)
Anion gap: 9 (ref 5–15)
BUN: 29 mg/dL — ABNORMAL HIGH (ref 8–23)
CO2: 28 mmol/L (ref 22–32)
Calcium: 9.5 mg/dL (ref 8.9–10.3)
Chloride: 101 mmol/L (ref 98–111)
Creatinine, Ser: 1.38 mg/dL — ABNORMAL HIGH (ref 0.61–1.24)
GFR calc Af Amer: 58 mL/min — ABNORMAL LOW (ref >60)
GFR calc non Af Amer: 50 mL/min — ABNORMAL LOW (ref >60)
Glucose, Bld: 319 mg/dL — ABNORMAL HIGH (ref 70–99)
Potassium: 5.2 mmol/L — ABNORMAL HIGH (ref 3.5–5.1)
Sodium: 138 mmol/L (ref 135–145)
Total Bilirubin: 0.6 mg/dL (ref 0.3–1.2)
Total Protein: 7.6 g/dL (ref 6.5–8.1)

## 2019-07-21 LAB — IRON AND TIBC
Iron: 100 ug/dL (ref 45–182)
Saturation Ratios: 24 % (ref 17.9–39.5)
TIBC: 416 ug/dL (ref 250–450)
UIBC: 316 ug/dL

## 2019-07-21 LAB — LACTATE DEHYDROGENASE: LDH: 123 U/L (ref 98–192)

## 2019-07-21 LAB — TSH: TSH: 1.511 u[IU]/mL (ref 0.350–4.500)

## 2019-07-21 LAB — FOLATE: Folate: 66 ng/mL (ref >5.9)

## 2019-07-21 LAB — FERRITIN: Ferritin: 34 ng/mL (ref 24–336)

## 2019-07-21 LAB — VITAMIN B12: Vitamin B-12: 1071 pg/mL — ABNORMAL HIGH (ref 180–914)

## 2019-07-22 ENCOUNTER — Encounter: Payer: Self-pay | Admitting: Oncology

## 2019-07-23 NOTE — Progress Notes (Signed)
Hematology/Oncology Consult note [facility]  Telephone:([phone] [phone]  Patient Care Team: Hubbard Hartshorn, FNP as PCP - General (Family Medicine) Vernon Prey, MD as Referring Physician (Ophthalmology) Meade Maw, MD as Consulting Physician (Neurosurgery)   Name of the patient: Garrett Yoder  [id number]  1945-12-31   Date of visit: 07/23/19  Diagnosis- 1. 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 Anemia likely due to chronic disease   Chief complaint/ Reason for visit- here to re establish follow up for ongoing fatigue  Heme/Onc history: patient is a74 year old Caucasian male with a past medical history significant for diabetes, GERD, hypertension hyperlipidemia anxiety among other medical problems he has been referred to [place] for thrombocytosis. Of note patient has always had thrombocytosis even dating back to 2013 when his platelet count was 484. Since then his platelet count had been ranging between 400's to 500s. In July 2018 his platelet count was 522 and then gradually rose to 566 seconds 728 and then 748 recently. White count has always been normal. He is also had a long-standing normocytic anemia and his hemoglobin ranges around 11. Most recent CBC from 11/14/2016 showed white count of 6.5, H&H of 10.2/31.7 with an MCV of 95 and a platelet count of 748. Iron studies show normal ferritin of 112 and iron saturation of 23% and TIBC was normal at 300  Patient has had a 12 pound weight loss over the last 3 months but states that most of it happened over last 2 months after his disk surgery 8 weeks ago. He states that his appetite is coming back and he is feeling better. He has seen Dr. Ruthell Rummage our practice in the past for iron deficiency anemia  Results of blood work from 11/25/2016 were as follows: CBC showed white count of 5.6, H&H of 11/32.8 and a platelet count of 542. B12 was normal at 257 and folate was normal. Haptoglobin levels  were normal. Reticulocyte count was mildly low at 0.9%. Jak 2 And MPL Were Negative. ESR Was Normal. Peripheral Smear Review Showed Normal Morphology of WBCs RBCs and Platelets. CMP Was within Normal Limits. BCR Abl Testing Was Negative. Multiple Myeloma Panel Revealed No M Protein with Immunofixation Revealed Free Lambda Light Chains  Bone marrow biopsy on 03/17/2017 showed normocellular bone marrow with trilineage hematopoiesis. Numerous lymphoid aggregates and normocytic normochromic anemia. Bone marrow did not show any morphologic features of myeloproliferative or myelodysplastic neoplasm. He did have numerous lymphoid aggregates. Flow cytometry analysis showed a minor lymphoid population of B cells (22%)which were less than 5% of all cells and were coexpressing CD5 and CD20. 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 Cytogenetics and FISH studies were normal. Plasma cells represented 2% of the cells and showed polyclonal staining pattern for kappa and lambda light chains and no evidence of plasma cell neoplasm in the bone marrow   Interval history-patient reports ongoing fatigue which has been particularly worse over the last 2 months.  Reports that his appetite and weight have been stable.  Denies any pain  ECOG PS- 1 Pain scale- 0   Review of systems- Review of Systems  Constitutional: Positive for malaise/fatigue. Negative for chills, fever and weight loss.  HENT: Negative for congestion, ear discharge and nosebleeds.   Eyes: Negative for blurred vision.  Respiratory: Negative for cough, hemoptysis, sputum production, shortness of breath and wheezing.   Cardiovascular: Negative for chest pain, palpitations, orthopnea and claudication.  Gastrointestinal: Negative for abdominal pain, blood in stool, constipation, diarrhea,  heartburn, melena, nausea and vomiting.  Genitourinary: Negative for dysuria, flank pain, frequency, hematuria and urgency.    Musculoskeletal: Negative for back pain, joint pain and myalgias.  Skin: Negative for rash.  Neurological: Negative for dizziness, tingling, focal weakness, seizures, weakness and headaches.  Endo/Heme/Allergies: Does not bruise/bleed easily.  Psychiatric/Behavioral: Negative for depression and suicidal ideas. The patient does not have insomnia.      Allergies  Allergen Reactions  . Codeine Nausea Only    Other reaction(s): makes him sick to his stomach Other reaction(s): makes him sick to his stomach     Past Medical History:  Diagnosis Date  . Anemia 08/14/2015  . Anxiety   . Aortic atherosclerosis ([place]) 11/25/2016   Chest CT Oct 2018  . Aorto-iliac atherosclerosis ([place]) 11/25/2016   Abd/pelvic CT Oct 2018  . Coronary artery calcification seen on CAT scan 11/25/2016   Chest CT Oct 2018; refer to Dr. Fletcher Anon  . DDD (degenerative disc disease), cervical 08/21/2016  . Depression   . Diabetes mellitus without complication ([place])   . Facet arthropathy, cervical 08/21/2016  . GERD (gastroesophageal reflux disease)   . Heart murmur    As child  . Hyperlipidemia   . Hypertension   . Hypertrophy of prostate with urinary obstruction 11/25/2016   Pelvic CT October 2018  . Incidental lung nodule, > 45m and < 872m10/10/2016   5 mm nodule LLL, chest CT Oct 2018  . [place]     Past Surgical History:  Procedure Laterality Date  . ANTERIOR CERVICAL DECOMP/DISCECTOMY FUSION N/A 09/24/2016   Procedure: ANTERIOR CERVICAL DECOMPRESSION/DISCECTOMY FUSION 3 LEVELS-C4-7;  Surgeon: YaMeade MawMD;  Location: [facility] ORS;  Service: Neurosurgery;  Laterality: N/A;  . CATARACT EXTRACTION    . ELECTROMAGNETIC NAVIGATION BROCHOSCOPY Right 04/07/2018   Procedure: ELECTROMAGNETIC NAVIGATION BRONCHOSCOPY;  Surgeon: KaFlora LippsMD;  Location: [facility] ORS;  Service: Cardiopulmonary;  Laterality: Right;  . EYE SURGERY Right    Cataract Extraction with IOL  . RETINAL LASER PROCEDURE       Social History   Socioeconomic History  . Marital status: Married    Spouse name: Not on file  . Number of children: Not on file  . Years of education: Not on file  . Highest education level: Not on file  Occupational History  . Occupation: retired  Tobacco Use  . Smoking status: Former Smoker    Packs/day: 1.00    Years: 30.00    Pack years: 30.00    Types: Cigarettes    Quit date: 07/19/1999    Years since quitting: 20.0  . Smokeless tobacco: Never Used  Substance and Sexual Activity  . Alcohol use: No    Alcohol/week: 0.0 standard drinks  . Drug use: No  . Sexual activity: Yes    Partners: Female  Other Topics Concern  . Not on file  Social History Narrative  . Not on file   Social Determinants of Health   Financial Resource Strain: Low Risk   . Difficulty of Paying Living Expenses: Not hard at all  Food Insecurity: No Food Insecurity  . Worried About RuCharity fundraisern the Last Year: Never true  . Ran Out of Food in the Last Year: Never true  Transportation Needs: No Transportation Needs  . Lack of Transportation (Medical): No  . Lack of Transportation (Non-Medical): No  Physical Activity: Inactive  . Days of Exercise per Week: 0 days  . Minutes of Exercise per Session: 0 min  Stress: No Stress Concern Present  . Feeling of Stress : Not at all  Social Connections: Unknown  . Frequency of Communication with Friends and Family: Patient refused  . Frequency of Social Gatherings with Friends and Family: Patient refused  . Attends Religious Services: Patient refused  . Active Member of Clubs or Organizations: Patient refused  . Attends Archivist Meetings: Patient refused  . Marital Status: Married  Human resources officer Violence: Not At Risk  . Fear of Current or Ex-Partner: No  . Emotionally Abused: No  . Physically Abused: No  . Sexually Abused: No    Family History  Problem Relation Age of Onset  . Stroke Mother   . Diabetes Mellitus II  Father   . Heart Problems Father   . Heart disease Father   . Diabetes Sister   . Cancer Maternal Grandfather   . Stroke Paternal Grandmother   . Prostate cancer Neg Hx   . Bladder Cancer Neg Hx   . Kidney cancer Neg Hx      Current Outpatient Medications:  .  acetaminophen (TYLENOL) 500 MG tablet, Take 1,000 mg by mouth 2 (two) times daily as needed. , Disp: , Rfl:  .  aspirin EC 81 MG tablet, Take 81 mg by mouth daily., Disp: , Rfl:  .  atorvastatin (LIPITOR) 40 MG tablet, TAKE 1 TABLET BY MOUTH AT  BEDTIME FOR CHOLESTEROL, Disp: 90 tablet, Rfl: 1 .  bimatoprost (LUMIGAN) 0.01 % SOLN, Place 1 drop into both eyes at bedtime. , Disp: , Rfl:  .  Blood Glucose Monitoring Suppl (ONE TOUCH ULTRA 2) w/Device KIT, OneTouch Ultra 2 device; Dx E11.9, insulin-dependent, LON 99 months; check fingerstick blood sugars four times a day, fluctuating, needs more frequent monitoring, Disp: 1 each, Rfl: 0 .  finasteride (PROSCAR) 5 MG tablet, Take 1 tablet (5 mg total) by mouth daily., Disp: 90 tablet, Rfl: 3 .  glucose blood (ONETOUCH ULTRA) test strip, USE TO TEST BLOOD SUGARS  THREE TIMES DAILY, Disp: , Rfl:  .  Insulin Detemir (LEVEMIR FLEXTOUCH) 100 UNIT/ML Pen, Inject 30 Units into the skin daily after supper., Disp: , Rfl:  .  insulin lispro (HUMALOG) 100 UNIT/ML KwikPen, Take 8 units before breakfast. Take 12 units before the mid day meal., Disp: , Rfl:  .  Insulin Pen Needle (PEN NEEDLES) 31G X 6 MM MISC, For use with pen device to inject insulin once a day; LON 99 months, E11.9, Z79.4, Disp: 100 each, Rfl: 3 .  irbesartan (AVAPRO) 150 MG tablet, TAKE 1 TABLET BY MOUTH  DAILY, Disp: 90 tablet, Rfl: 3 .  metFORMIN (GLUCOPHAGE) 1000 MG tablet, Take 0.5 tablets (500 mg total) by mouth daily with breakfast., Disp: , Rfl:  .  Multiple Vitamin (MULTI-VITAMINS) TABS, Take 1 tablet by mouth 2 (two) times daily. , Disp: , Rfl:  .  ONE TOUCH LANCETS MISC, Check fingerstick blood sugars four times a day; LON  99 months; type 2 diabetes on insulin E11.9, fluctuating sugars, Disp: 200 each, Rfl: 11 .  pioglitazone (ACTOS) 15 MG tablet, Take 15 mg by mouth daily., Disp: , Rfl:  .  SIMBRINZA 1-0.2 % SUSP, Place 1 drop into both eyes 3 (three) times daily. , Disp: , Rfl:  .  tamsulosin (FLOMAX) 0.4 MG CAPS capsule, TAKE 1 CAPSULE BY MOUTH  DAILY, Disp: 90 capsule, Rfl: 3  Physical exam:  Vitals:   07/21/19 1022  BP: (!) 132/57  Pulse: 94  Resp: 18  Temp: 98.4  F (36.9 C)  TempSrc: Oral  SpO2: 100%  Weight: 131 lb 8 oz (59.6 kg)   Physical Exam Constitutional:      General: He is not in acute distress. Cardiovascular:     Rate and Rhythm: Normal rate and regular rhythm.     Heart sounds: Normal heart sounds.  Pulmonary:     Effort: Pulmonary effort is normal.     Breath sounds: Normal breath sounds.  Abdominal:     General: Bowel sounds are normal.     Palpations: Abdomen is soft.     Comments: No palpable hepatosplenomegaly  Lymphadenopathy:     Comments: No palpable cervical, supraclavicular, axillary or inguinal adenopathy   Skin:    General: Skin is warm and dry.  Neurological:     Mental Status: He is alert and oriented to person, place, and time.      CMP Latest Ref Rng & Units 07/21/2019  Glucose 70 - 99 mg/dL 319(H)  BUN 8 - 23 mg/dL 29(H)  Creatinine 0.61 - 1.24 mg/dL 1.38(H)  Sodium 135 - 145 mmol/L 138  Potassium 3.5 - 5.1 mmol/L 5.2(H)  Chloride 98 - 111 mmol/L 101  CO2 22 - 32 mmol/L 28  Calcium 8.9 - 10.3 mg/dL 9.5  Total Protein 6.5 - 8.1 g/dL 7.6  Total Bilirubin 0.3 - 1.2 mg/dL 0.6  Alkaline Phos 38 - 126 U/L 65  AST 15 - 41 U/L 18  ALT 0 - 44 U/L 21   CBC Latest Ref Rng & Units 07/21/2019  WBC 4.0 - 10.5 K/uL 7.3  Hemoglobin 13.0 - 17.0 g/dL 12.0(L)  Hematocrit 39.0 - 52.0 % 36.4(L)  Platelets 150 - 400 K/uL 465(H)      Assessment and plan- Patient is a 74 y.o. male who is here to re establish followup for thrombocytosis and anemia due to ongoing  fatigue  1.  Thrombocytosis: This has essentially been chronicAnd essentially stable with a platelet count that fluctuates between high 400s to 500s.  Currently it is 465 which is overall stable since the last 2 years.  He has had an extensive work-up for this including a bone marrow biopsy which did not reveal any myeloproliferative disorder.  2.  Chronic anemia with a hemoglobin that fluctuates between normocytic anemia: Patient has undergone extensive anemia work-up in the past.  Due to his ongoing fatigue I will check ferritin and iron studies B12 folate and TSH today.  We will call the patient with the results of the blood work. His hemoglobin is no different now than it has been in the last 2 years. I therefore do not think his fatigue is related to anemia     Visit Diagnosis 1. Normocytic anemia   2. Thrombocytosis ([place])   3. Fatigue, unspecified type      Dr. Randa Evens, MD, MPH [facility] at [facility] [phone] 07/23/2019 11:32 AM

## 2019-08-03 ENCOUNTER — Other Ambulatory Visit: Payer: Self-pay | Admitting: *Deleted

## 2019-08-03 MED ORDER — FINASTERIDE 5 MG PO TABS: 5.0000 mg | ORAL_TABLET | Freq: Every day | ORAL | 3 refills | Status: DC

## 2019-08-04 ENCOUNTER — Other Ambulatory Visit: Payer: Self-pay

## 2019-08-04 MED ORDER — IRBESARTAN 150 MG PO TABS: 150.0000 mg | ORAL_TABLET | Freq: Every day | ORAL | 3 refills | Status: DC

## 2019-08-04 MED ORDER — ATORVASTATIN CALCIUM 40 MG PO TABS: ORAL_TABLET | ORAL | 3 refills | Status: DC

## 2019-08-16 NOTE — Progress Notes (Signed)
Patient ID: Garrett Spikes., male    DOB: Jun 26, 1945, 74 y.o.   MRN: [id number]  PCP: Towanda Malkin, MD  Chief Complaint  Patient presents with  . Follow-up    elevated creatinine  . Hyperkalemia    Subjective:   Garrett Yoder. is a 74 y.o. male, presents to clinic with CC of the following:  Chief Complaint  Patient presents with  . Follow-up    elevated creatinine  . Hyperkalemia    HPI:  Patient is a 74 year old male, last visit with me was on 06/07/2019. He follows up today after having some mild lab abnormalities at his most recent follow-up visit with Dr. Janese Banks.  He notes all in all, he still feels a little more fatigued at times, like he noted at our last visit, but has not increased significantly.  Denied any other concerning symptoms recently.  Since our last visit, he did follow-up with Dr. Janese Banks with the assessment/plan as follows: Patient is a 74 y.o. male who is here to re establish followup for thrombocytosis and anemia due to ongoing fatigue 1.  Thrombocytosis: This has essentially been chronic And essentially stable with a platelet count that fluctuates between high 400s to 500s.  Currently it is 465 which is overall stable since the last 2 years.  He has had an extensive work-up for this including a bone marrow biopsy which did not reveal any myeloproliferative disorder. 2.  Chronic anemia with a hemoglobin that fluctuates between normocytic anemia: Patient has undergone extensive anemia work-up in the past.  Due to his ongoing fatigue I will check ferritin and iron studies B12 folate and TSH today.  We will call the patient with the results of the blood work. His hemoglobin is no different now than it has been in the last 2 years. I therefore do not think his fatigue is related to anemia  Labs checked at that visit showed a slightly elevated potassium and creatinine on his BMP, and also an elevated glucose level in the low 300s with a follow-up  with the primary care provider recommended.   Last metabolic panel Lab Results  Component Value Date   GLUCOSE 319 (H) 07/21/2019   NA 138 07/21/2019   K 5.2 (H) 07/21/2019   CL 101 07/21/2019   CO2 28 07/21/2019   BUN 29 (H) 07/21/2019   CREATININE 1.38 (H) 07/21/2019   GFRNONAA 50 (L) 07/21/2019   GFRAA 58 (L) 07/21/2019   CALCIUM 9.5 07/21/2019   PROT 7.6 07/21/2019   ALBUMIN 5.0 07/21/2019   LABGLOB 2.5 03/13/2017   AGRATIO 2.1 07/24/2016   BILITOT 0.6 07/21/2019   ALKPHOS 65 07/21/2019   AST 18 07/21/2019   ALT 21 07/21/2019   ANIONGAP 9 07/21/2019  Had BMP 03/02/19 - BUN/Cr - 28/1.4, GFR - 50, lytes normal  He was recommended to start a low-dose iron supplement by Dr. Janese Banks after labs checked at that last visit  DM Patient followed by Dr. Gabriel Carina - endocrine with last visit 03/09/19 and A1c 7.5% then, and noted has Type 2 diabetes mellitus with stage 3a chronic kidney disease, with long-term current use of insulin (CMS-HCC) with the following plan/recommendations from that visit: Plan: * Counseled him that diabetes is controlled.  * Continue Levemir as prescribed.  * Adjust Humalog to 8 units before breakfast and 12 units before lunch. No Humalog before supper.  * Continue low dose metformin and pioglitazone.  * Check blood glucose 2  times daily.  * Encouraged his to follow a low carb diet.  * RTC for follow up in 6 months with labs prior. Also noted in his past was that higher doses of metformin caused GI upset and a DexCom CGM was ordered and insurer denied.   His blood sugars have been not as well controlled at home in past couple weeks, usually in the mornings have been good, 100-150 range, in the evening has had several higher readings in the 300 range, nothing over 400. No low readings Med regimen not changed since last endocrine visit in Jan Has f/u with them in about 3 weeks Taking ARB, a statin.  Denies increased thirst, any increased urinary frequency, no  numbness/tingling in the ext's Still thinks feels a little more fatigue as noted at our last visit, has not increased. Lab Results  Component Value Date   HGBA1C 7.1 05/13/2018   HGBA1C 6.4 (H) 12/07/2017   HGBA1C 8.0 (H) 07/17/2017   Lab Results  Component Value Date   MICROALBUR 3.0 07/17/2017   LDLCALC 71 06/07/2019   CREATININE 1.38 (H) 07/21/2019   Had BMP 03/02/19 - BUN/Cr - 28/1.4, GFR - 50, lytes normal  HLD: Tolerating statin,  Last lipids at goal with LDL <70 Recent Labs       Lab Results  Component Value Date   CHOL 129 07/17/2017   HDL 55 07/17/2017   LDLCALC 59 07/17/2017   TRIG 69 07/17/2017   CHOLHDL 2.3 07/17/2017      Denies increased myalgias, chest pain, palpitations, shortness of breath.  No increased headaches, no vision concerns, no increased lower extremity swelling.  HTN: Taking irbesartan;  checks BP's at home and they have remained controlled  BP Readings from Last 3 Encounters:  08/17/19 124/64  07/21/19 (!) 132/57  06/07/19 (!) 110/50      Patient Active Problem List   Diagnosis Date Noted  . Glaucoma of both eyes 11/29/2018  . Benign prostatic hyperplasia with nocturia 11/29/2018  . Abnormal findings on diagnostic imaging of lung 11/29/2018  . Type 2 diabetes mellitus with hyperglycemia, with long-term current use of insulin ([place]) 05/28/2018  . Lung nodule   . Exposure to cigarette smoke 03/09/2018  . Uncontrolled type 2 diabetes mellitus ([place]) 11/27/2016  . Aortic atherosclerosis ([place]) 11/25/2016  . Coronary artery calcification seen on CAT scan 11/25/2016  . Incidental lung nodule, > 38m and < 85m10/10/2016  . Aorto-iliac atherosclerosis ([facility]/10/2016  . Hypertrophy of prostate with urinary obstruction 11/25/2016  . Abnormal computed tomography of bladder 11/25/2016  . Cervical myelopathy ([facility]08/09/2016  . Neck pain, chronic 08/21/2016  . DDD (degenerative disc disease), cervical 08/21/2016  . Facet  arthropathy, cervical 08/21/2016  . De Quervain's tenosynovitis 08/21/2016  . Vertigo 10/06/2015  . Acid reflux 09/09/2015  . Essential hypertension, benign 08/14/2015  . Hyperlipidemia 08/14/2015  . Medication monitoring encounter 08/14/2015  . Anemia 08/14/2015  . Thrombocytosis ([facility]/27/2017      Current Outpatient Medications:  .  acetaminophen (TYLENOL) 500 MG tablet, Take 1,000 mg by mouth 2 (two) times daily as needed. , Disp: , Rfl:  .  aspirin EC 81 MG tablet, Take 81 mg by mouth daily., Disp: , Rfl:  .  atorvastatin (LIPITOR) 40 MG tablet, TAKE 1 TABLET BY MOUTH AT  BEDTIME FOR CHOLESTEROL, Disp: 90 tablet, Rfl: 3 .  bimatoprost (LUMIGAN) 0.01 % SOLN, Place 1 drop into both eyes at bedtime. , Disp: , Rfl:  .  Blood  Glucose Monitoring Suppl (ONE TOUCH ULTRA 2) w/Device KIT, OneTouch Ultra 2 device; Dx E11.9, insulin-dependent, LON 99 months; check fingerstick blood sugars four times a day, fluctuating, needs more frequent monitoring, Disp: 1 each, Rfl: 0 .  finasteride (PROSCAR) 5 MG tablet, Take 1 tablet (5 mg total) by mouth daily., Disp: 90 tablet, Rfl: 3 .  glucose blood (ONETOUCH ULTRA) test strip, USE TO TEST BLOOD SUGARS  THREE TIMES DAILY, Disp: , Rfl:  .  Insulin Detemir (LEVEMIR FLEXTOUCH) 100 UNIT/ML Pen, Inject 30 Units into the skin daily after supper., Disp: , Rfl:  .  insulin lispro (HUMALOG) 100 UNIT/ML KwikPen, Take 8 units before breakfast. Take 12 units before the mid day meal., Disp: , Rfl:  .  Insulin Pen Needle (PEN NEEDLES) 31G X 6 MM MISC, For use with pen device to inject insulin once a day; LON 99 months, E11.9, Z79.4, Disp: 100 each, Rfl: 3 .  irbesartan (AVAPRO) 150 MG tablet, Take 1 tablet (150 mg total) by mouth daily., Disp: 90 tablet, Rfl: 3 .  metFORMIN (GLUCOPHAGE) 1000 MG tablet, Take 0.5 tablets (500 mg total) by mouth daily with breakfast., Disp: , Rfl:  .  Multiple Vitamin (MULTI-VITAMINS) TABS, Take 1 tablet by mouth 2 (two) times daily. ,  Disp: , Rfl:  .  ONE TOUCH LANCETS MISC, Check fingerstick blood sugars four times a day; LON 99 months; type 2 diabetes on insulin E11.9, fluctuating sugars, Disp: 200 each, Rfl: 11 .  pioglitazone (ACTOS) 15 MG tablet, Take 15 mg by mouth daily., Disp: , Rfl:  .  SIMBRINZA 1-0.2 % SUSP, Place 1 drop into both eyes 3 (three) times daily. , Disp: , Rfl:  .  tamsulosin (FLOMAX) 0.4 MG CAPS capsule, TAKE 1 CAPSULE BY MOUTH  DAILY, Disp: 90 capsule, Rfl: 3   Allergies  Allergen Reactions  . Codeine Nausea Only    Other reaction(s): makes him sick to his stomach Other reaction(s): makes him sick to his stomach     Past Surgical History:  Procedure Laterality Date  . ANTERIOR CERVICAL DECOMP/DISCECTOMY FUSION N/A 09/24/2016   Procedure: ANTERIOR CERVICAL DECOMPRESSION/DISCECTOMY FUSION 3 LEVELS-C4-7;  Surgeon: Meade Maw, MD;  Location: [facility] ORS;  Service: Neurosurgery;  Laterality: N/A;  . CATARACT EXTRACTION    . ELECTROMAGNETIC NAVIGATION BROCHOSCOPY Right 04/07/2018   Procedure: ELECTROMAGNETIC NAVIGATION BRONCHOSCOPY;  Surgeon: Flora Lipps, MD;  Location: [facility] ORS;  Service: Cardiopulmonary;  Laterality: Right;  . EYE SURGERY Right    Cataract Extraction with IOL  . RETINAL LASER PROCEDURE       Family History  Problem Relation Age of Onset  . Stroke Mother   . Diabetes Mellitus II Father   . Heart Problems Father   . Heart disease Father   . Diabetes Sister   . Cancer Maternal Grandfather   . Stroke Paternal Grandmother   . Prostate cancer Neg Hx   . Bladder Cancer Neg Hx   . Kidney cancer Neg Hx      Social History   Tobacco Use  . Smoking status: Former Smoker    Packs/day: 1.00    Years: 30.00    Pack years: 30.00    Types: Cigarettes    Quit date: 07/19/1999    Years since quitting: 20.0  . Smokeless tobacco: Never Used  Substance Use Topics  . Alcohol use: No    Alcohol/week: 0.0 standard drinks    With staff assistance, above reviewed with the  patient/caregiver today.  ROS: As per  HPI, otherwise no specific complaints on a limited and focused system review   No results found for this or any previous visit (from the past 72 hour(s)).   PHQ2/9: Depression screen [facility] 2/9 08/17/2019 06/07/2019 03/31/2019 11/29/2018 05/28/2018  Decreased Interest 0 0 0 0 0  Down, Depressed, Hopeless 0 0 0 0 0  PHQ - 2 Score 0 0 0 0 0  Altered sleeping 0 0 - 0 0  Tired, decreased energy 0 0 - 0 0  Change in appetite 0 0 - 0 0  Feeling bad or failure about yourself  0 0 - 0 0  Trouble concentrating 0 0 - 0 0  Moving slowly or fidgety/restless 0 0 - 0 0  Suicidal thoughts 0 0 - 0 0  PHQ-9 Score 0 0 - 0 0  Difficult doing work/chores Not difficult at all Not difficult at all - Not difficult at all Not difficult at all  Some recent data might be hidden   PHQ-2/9 Result is neg  Fall Risk: Fall Risk  08/17/2019 06/07/2019 03/31/2019 11/29/2018 05/28/2018  Falls in the past year? 0 0 0 0 0  Number falls in past yr: 0 0 0 0 0  Injury with Fall? 0 0 0 0 0  Risk for fall due to : - - No Fall Risks - -  Follow up - - Falls prevention discussed Falls evaluation completed -      Objective:   Vitals:   08/17/19 0842  BP: 124/64  Pulse: 80  Resp: 16  Temp: 97.6 F (36.4 C)  TempSrc: Temporal  SpO2: 99%  Weight: 133 lb 8 oz (60.6 kg)  Height: 5' 8" (1.727 m)    Body mass index is 20.3 kg/m.  Physical Exam   NAD, masked, pleasant, slightly pale appearing HEENT - [place]/AT, sclera anicteric, positive glasses, PERRL, EOMI, conj - non-inj'ed, pharynx clear Neck - supple, no adenopathy, no TM,  Car - RRR without m/g/r Pulm- RR and effort normal at rest, CTA without wheeze or rales Abd - soft, NT diffusely, ND,  Back - no CVA tenderness Skin- no rash noted on exposed areas, Ext - no LE edema, Neuro/psychiatric - affect was not flat, appropriate with conversation             Alert              Grossly non-focal              Speech normal    Results for orders placed or performed in visit on 07/21/19  Comprehensive metabolic panel  Result Value Ref Range   Sodium 138 135 - 145 mmol/L   Potassium 5.2 (H) 3.5 - 5.1 mmol/L   Chloride 101 98 - 111 mmol/L   CO2 28 22 - 32 mmol/L   Glucose, Bld 319 (H) 70 - 99 mg/dL   BUN 29 (H) 8 - 23 mg/dL   Creatinine, Ser 1.38 (H) 0.61 - 1.24 mg/dL   Calcium 9.5 8.9 - 10.3 mg/dL   Total Protein 7.6 6.5 - 8.1 g/dL   Albumin 5.0 3.5 - 5.0 g/dL   AST 18 15 - 41 U/L   ALT 21 0 - 44 U/L   Alkaline Phosphatase 65 38 - 126 U/L   Total Bilirubin 0.6 0.3 - 1.2 mg/dL   GFR calc non Af Amer 50 (L) >60 mL/min   GFR calc Af Amer 58 (L) >60 mL/min   Anion gap 9 5 - 15  Lactate dehydrogenase  Result Value Ref Range   LDH 123 98 - 192 U/L  TSH  Result Value Ref Range   TSH 1.511 0.350 - 4.500 uIU/mL  Folate  Result Value Ref Range   Folate 66.0 >5.9 ng/mL  Vitamin B12  Result Value Ref Range   Vitamin B-12 1,071 (H) 180 - 914 pg/mL  Iron and TIBC  Result Value Ref Range   Iron 100 45 - 182 ug/dL   TIBC 416 250 - 450 ug/dL   Saturation Ratios 24 17.9 - 39.5 %   UIBC 316 ug/dL  Ferritin  Result Value Ref Range   Ferritin 34 24 - 336 ng/mL  CBC with Differential/Platelet  Result Value Ref Range   WBC 7.3 4.0 - 10.5 K/uL   RBC 3.81 (L) 4.22 - 5.81 MIL/uL   Hemoglobin 12.0 (L) 13.0 - 17.0 g/dL   HCT 36.4 (L) 39 - 52 %   MCV 95.5 80.0 - 100.0 fL   MCH 31.5 26.0 - 34.0 pg   MCHC 33.0 30.0 - 36.0 g/dL   RDW 12.7 11.5 - 15.5 %   Platelets 465 (H) 150 - 400 K/uL   nRBC 0.0 0.0 - 0.2 %   Neutrophils Relative % 72 %   Neutro Abs 5.4 1.7 - 7.7 K/uL   Lymphocytes Relative 18 %   Lymphs Abs 1.3 0.7 - 4.0 K/uL   Monocytes Relative 7 %   Monocytes Absolute 0.5 0 - 1 K/uL   Eosinophils Relative 1 %   Eosinophils Absolute 0.1 0 - 0 K/uL   Basophils Relative 1 %   Basophils Absolute 0.0 0 - 0 K/uL   Immature Granulocytes 1 %   Abs Immature Granulocytes 0.06 0.00 - 0.07 K/uL        Assessment & Plan:    1. Hyperkalemia His potassium was slightly elevated on recent check, and do feel a recheck is indicated. His kidney function has not significantly changed from prior lab check in January 2694. - BASIC METABOLIC PANEL WITH GFR  2. Type 2 diabetes mellitus with hyperglycemia, with long-term current use of insulin (HCC) His sugars have been just a little higher on checks later in the day, still very well controlled on first checks in the morning.  His medication regimen has not changed since seeing endocrine in January. He has a follow-up appointment with them in 3 weeks time, and we will hold off on changing any of the medications including the insulin at this time and await their recommendations on follow-up. We will repeat the basic metabolic panel today. - BASIC METABOLIC PANEL WITH GFR  3. Stage 3a chronic kidney disease As noted above, his kidney function has not significantly changed since January 2021.  Has remained stable. - BASIC METABOLIC PANEL WITH GFR  4. Essential hypertension, benign Blood pressure remains well controlled.  5. Mixed hyperlipidemia Remains on a statin.  6. Thrombocytosis ([place]) 7. Anemia, unspecified type He did return to see Dr. Janese Banks after our last visit, with her assessment and plans as noted above in the HPI.  Felt to be stable.  She did recheck labs as noted. He noted a history of iron infusions, and Dr. Janese Banks had noted this as well, with repeating any iron infusions not felt necessary presently.  She did recommend a low-dose iron supplement.  Await recheck of BMP today, and keep his follow-up with endocrine in 3 weeks as well as his follow-up here again as scheduled after our last visit. Should follow-up sooner as  needed.       Towanda Malkin, MD 08/17/19 8:57 AM

## 2019-08-17 ENCOUNTER — Encounter: Payer: Self-pay | Admitting: Internal Medicine

## 2019-08-17 ENCOUNTER — Other Ambulatory Visit: Payer: Self-pay

## 2019-08-17 ENCOUNTER — Ambulatory Visit (INDEPENDENT_AMBULATORY_CARE_PROVIDER_SITE_OTHER): Payer: Medicare Other | Admitting: Internal Medicine

## 2019-08-17 VITALS — BP 124/64 | HR 80 | Temp 97.6°F | Resp 16 | Ht 68.0 in | Wt 133.5 lb

## 2019-08-17 DIAGNOSIS — I10 Essential (primary) hypertension: Secondary | ICD-10-CM | POA: Diagnosis not present

## 2019-08-17 DIAGNOSIS — N1831 Chronic kidney disease, stage 3a: Secondary | ICD-10-CM

## 2019-08-17 DIAGNOSIS — E875 Hyperkalemia: Secondary | ICD-10-CM | POA: Diagnosis not present

## 2019-08-17 DIAGNOSIS — D75839 Thrombocytosis, unspecified: Secondary | ICD-10-CM

## 2019-08-17 DIAGNOSIS — D473 Essential (hemorrhagic) thrombocythemia: Secondary | ICD-10-CM

## 2019-08-17 DIAGNOSIS — E1165 Type 2 diabetes mellitus with hyperglycemia: Secondary | ICD-10-CM

## 2019-08-17 DIAGNOSIS — Z794 Long term (current) use of insulin: Secondary | ICD-10-CM

## 2019-08-17 DIAGNOSIS — D649 Anemia, unspecified: Secondary | ICD-10-CM

## 2019-08-17 DIAGNOSIS — E782 Mixed hyperlipidemia: Secondary | ICD-10-CM

## 2019-08-17 LAB — BASIC METABOLIC PANEL WITH GFR
BUN/Creatinine Ratio: 18 (calc) (ref 6–22)
BUN: 26 mg/dL — ABNORMAL HIGH (ref 7–25)
CO2: 27 mmol/L (ref 20–32)
Calcium: 10.1 mg/dL (ref 8.6–10.3)
Chloride: 102 mmol/L (ref 98–110)
Creat: 1.42 mg/dL — ABNORMAL HIGH (ref 0.70–1.18)
GFR, Est African American: 56 mL/min/{1.73_m2} — ABNORMAL LOW (ref >=60)
GFR, Est Non African American: 49 mL/min/{1.73_m2} — ABNORMAL LOW (ref >=60)
Glucose, Bld: 349 mg/dL — ABNORMAL HIGH (ref 65–99)
Potassium: 5.1 mmol/L (ref 3.5–5.3)
Sodium: 139 mmol/L (ref 135–146)

## 2019-08-19 ENCOUNTER — Ambulatory Visit: Payer: Medicare Other | Admitting: Urology

## 2019-08-27 NOTE — Progress Notes (Signed)
08/29/2019 2:16 PM   Garrett Yoder 1945-10-22 [id number]  Referring provider: Hubbard Yoder, Garrett Yoder,  [place] [address] Chief Complaint  Patient presents with  . Follow-up    Urologic history: 1.  BPH with lower urinary tract symptoms -Long-term tamsulosin  -Finasteride added 12/2016 for worsening frequency/urgency with marked improvement in symptoms   HPI: Garrett Yoder. is a 74 y.o. male who present today for a 1 year follow up for BPH.  -Myrbetriq started last year visit for increased frequency -Due to cost stopped Myrbetriq -Remains on finasteride and tamsulosin. -Trying to drink 1 cup of coffee per day. Currently drinking 2 cups. -Denies dysuria, gross hematuria -Denies flank, abdominal or pelvic pain   PMH: Past Medical History:  Diagnosis Date  . Anemia 08/14/2015  . Anxiety   . Aortic atherosclerosis ([place]) 11/25/2016   Chest CT Oct 2018  . Aorto-iliac atherosclerosis ([place]) 11/25/2016   Abd/pelvic CT Oct 2018  . Coronary artery calcification seen on CAT scan 11/25/2016   Chest CT Oct 2018; refer to Dr. Fletcher Anon  . DDD (degenerative disc disease), cervical 08/21/2016  . Depression   . Diabetes mellitus without complication ([place])   . Facet arthropathy, cervical 08/21/2016  . GERD (gastroesophageal reflux disease)   . Heart murmur    As child  . Hyperlipidemia   . Hypertension   . Hypertrophy of prostate with urinary obstruction 11/25/2016   Pelvic CT October 2018  . Incidental lung nodule, > 52m and < 863m10/10/2016   5 mm nodule LLL, chest CT Oct 2018  . Thrombocytosis ([facility]/27/2017    Surgical History: Past Surgical History:  Procedure Laterality Date  . ANTERIOR CERVICAL DECOMP/DISCECTOMY FUSION N/A 09/24/2016   Procedure: ANTERIOR CERVICAL DECOMPRESSION/DISCECTOMY FUSION 3 LEVELS-C4-7;  Surgeon: YaMeade MawMD;  Location: [facility] ORS;  Service: Neurosurgery;  Laterality: N/A;  . CATARACT EXTRACTION    .  ELECTROMAGNETIC NAVIGATION BROCHOSCOPY Right 04/07/2018   Procedure: ELECTROMAGNETIC NAVIGATION BRONCHOSCOPY;  Surgeon: KaFlora LippsMD;  Location: [facility] ORS;  Service: Cardiopulmonary;  Laterality: Right;  . EYE SURGERY Right    Cataract Extraction with IOL  . RETINAL LASER PROCEDURE      Home Medications:  Allergies as of 08/29/2019      Reactions   Codeine Nausea Only   Other reaction(s): makes him sick to his stomach Other reaction(s): makes him sick to his stomach      Medication List       Accurate as of August 29, 2019  2:16 PM. If you have any questions, ask your nurse or doctor.        acetaminophen 500 MG tablet Commonly known as: TYLENOL Take 1,000 mg by mouth 2 (two) times daily as needed.   aspirin EC 81 MG tablet Take 81 mg by mouth daily.   atorvastatin 40 MG tablet Commonly known as: LIPITOR TAKE 1 TABLET BY MOUTH AT  BEDTIME FOR CHOLESTEROL   bimatoprost 0.01 % Soln Commonly known as: LUMIGAN Place 1 drop into both eyes at bedtime.   finasteride 5 MG tablet Commonly known as: PROSCAR Take 1 tablet (5 mg total) by mouth daily.   insulin detemir 100 UNIT/ML FlexPen Commonly known as: Levemir FlexTouch Inject 30 Units into the skin daily after supper.   insulin lispro 100 UNIT/ML KwikPen Commonly known as: HUMALOG Take 8 units before breakfast. Take 12 units before the mid day meal.   irbesartan 150 MG tablet Commonly known as:  AVAPRO Take 1 tablet (150 mg total) by mouth daily.   metFORMIN 1000 MG tablet Commonly known as: GLUCOPHAGE Take 0.5 tablets (500 mg total) by mouth daily with breakfast.   Multi-Vitamins Tabs Take 1 tablet by mouth 2 (two) times daily.   ONE TOUCH LANCETS Misc Check fingerstick blood sugars four times a day; LON 99 months; type 2 diabetes on insulin E11.9, fluctuating sugars   ONE TOUCH ULTRA 2 w/Device Kit OneTouch Ultra 2 device; Dx E11.9, insulin-dependent, LON 99 months; check fingerstick blood sugars four times a  day, fluctuating, needs more frequent monitoring   OneTouch Ultra test strip Generic drug: glucose blood USE TO TEST BLOOD SUGARS  THREE TIMES DAILY   Pen Needles 31G X 6 MM Misc For use with pen device to inject insulin once a day; LON 99 months, E11.9, Z79.4   pioglitazone 15 MG tablet Commonly known as: ACTOS Take 15 mg by mouth daily.   Simbrinza 1-0.2 % Susp Generic drug: Brinzolamide-Brimonidine Place 1 drop into both eyes 3 (three) times daily.   tamsulosin 0.4 MG Caps capsule Commonly known as: FLOMAX TAKE 1 CAPSULE BY MOUTH  DAILY       Allergies:  Allergies  Allergen Reactions  . Codeine Nausea Only    Other reaction(s): makes him sick to his stomach Other reaction(s): makes him sick to his stomach    Family History: Family History  Problem Relation Age of Onset  . Stroke Mother   . Diabetes Mellitus II Father   . Heart Problems Father   . Heart disease Father   . Diabetes Sister   . Cancer Maternal Grandfather   . Stroke Paternal Grandmother   . Prostate cancer Neg Hx   . Bladder Cancer Neg Hx   . Kidney cancer Neg Hx     Social History:  reports that he quit smoking about 20 years ago. His smoking use included cigarettes. He has a 30.00 pack-year smoking history. He has never used smokeless tobacco. He reports that he does not drink alcohol and does not use drugs.   Physical Exam: BP 125/74   Pulse (!) 103   Ht 6' (1.829 m)   Wt 135 lb (61.2 kg)   BMI 18.31 kg/m   Constitutional:  Alert and oriented, No acute distress. HEENT: [place] AT, moist mucus membranes.  Trachea midline, no masses. Cardiovascular: No clubbing, cyanosis, or edema. Respiratory: Normal respiratory effort, no increased work of breathing. Skin: No rashes, bruises or suspicious lesions. Neurologic: Grossly intact, no focal deficits, moving all 4 extremities. Psychiatric: Normal mood and affect.  Laboratory Data:  Lab Results  Component Value Date   CREATININE 1.42 (H)  08/17/2019     Assessment & Plan:   1. BPH with LUTS  -No bothersome symptoms.  -Stopped Myrbetriq due to cost.  -Remains on finasteride and tamsulosin. -Patinet declined any surgical intervention at this time.  Follow up in 1 year.   [facility] [address] [place], [place] [address] [phone]  I, Selena Batten, am acting as a scribe for Dr. Nicki Reaper C. Eilidh Marcano,  I have reviewed the above documentation for accuracy and completeness, and I agree with the above.   Abbie Sons, MD

## 2019-08-29 ENCOUNTER — Other Ambulatory Visit: Payer: Self-pay

## 2019-08-29 ENCOUNTER — Ambulatory Visit (INDEPENDENT_AMBULATORY_CARE_PROVIDER_SITE_OTHER): Payer: Medicare Other | Admitting: Urology

## 2019-08-29 ENCOUNTER — Encounter: Payer: Self-pay | Admitting: Urology

## 2019-08-29 VITALS — BP 125/74 | HR 103 | Ht 72.0 in | Wt 135.0 lb

## 2019-08-29 DIAGNOSIS — N401 Enlarged prostate with lower urinary tract symptoms: Secondary | ICD-10-CM

## 2019-10-25 ENCOUNTER — Other Ambulatory Visit: Payer: Self-pay | Admitting: Family Medicine

## 2019-11-09 ENCOUNTER — Ambulatory Visit
Admission: RE | Admit: 2019-11-09 | Discharge: 2019-11-09 | Disposition: A | Discharge status: Home / Self Care | Payer: Medicare Other | Source: Ambulatory Visit | Attending: Internal Medicine | Admitting: Internal Medicine

## 2019-11-09 ENCOUNTER — Other Ambulatory Visit: Payer: Self-pay

## 2019-11-09 DIAGNOSIS — R918 Other nonspecific abnormal finding of lung field: Secondary | ICD-10-CM

## 2019-11-22 ENCOUNTER — Ambulatory Visit: Payer: Medicare Other | Admitting: Internal Medicine

## 2019-11-22 ENCOUNTER — Other Ambulatory Visit: Payer: Self-pay

## 2019-11-22 ENCOUNTER — Encounter: Payer: Self-pay | Admitting: Internal Medicine

## 2019-11-22 VITALS — BP 130/60 | HR 85 | Temp 97.3°F | Ht 68.0 in | Wt 134.2 lb

## 2019-11-22 DIAGNOSIS — R918 Other nonspecific abnormal finding of lung field: Secondary | ICD-10-CM | POA: Diagnosis not present

## 2019-11-22 NOTE — Patient Instructions (Signed)
Follow-up CT chest 1 year

## 2019-11-22 NOTE — Progress Notes (Signed)
Name: Garrett Yoder. MRN: [id number] DOB: 03/09/1945     CONSULTATION DATE:  11/22/2019 REFERRING MD : Genevive Bi  CHIEF COMPLAINT:  Follow-up abnormal CT chest Right lower lobe groundglass opacity   STUDIES:   11/09/2019 CT chest independently reviewed by me today Right lower lobe groundglass opacification has unchanged over the last several years There is no pleural effusion. There are 2 stable subpleural ground-glass nodules in the right lower lobe, measuring 1.8 x 1.1 cm on image 111/3 and 5 mm on image 108/3. These nodules are unchanged in appearance, without increasing soft tissue components. No new or enlarging pulmonary nodules.      12/14/2017 CT chest Independently reviewed by Me Right lower lobe groundglass opacification 17 x 6 mm  CT chest 2013 independently reviewed by me today Right lower lobe groundglass opacification approximately 6 mm  SYNOPSIS Follow-up abnormal CT chest Patient had slow-growing right lower lobe groundglass opacification over the last 6 years Patient underwent ENB February 2020 Biopsies showed reactive cells no malignancy  seen   Patient is a former smoker quit in 2001 Smoked 1 pack a day for 30 years   HISTORY OF PRESENT ILLNESS: Follow-up CT chest September 2021 No significant growth the right lower lobe groundglass opacification Findings discussed only to the patient Patient satisfied with findings  No exacerbation at this time No evidence of heart failure at this time No evidence or signs of infection at this time No respiratory distress No fevers, chills, nausea, vomiting, diarrhea No evidence of lower extremity edema No evidence hemoptysis     Review of Systems:  Gen:  Denies  fever, sweats, chills weight loss  HEENT: Denies blurred vision, double vision, ear pain, eye pain, hearing loss, nose bleeds, sore throat Cardiac:  No dizziness, chest pain or heaviness, chest tightness,edema, No JVD Resp:   No cough,  -sputum production, -shortness of breath,-wheezing, -hemoptysis,  Gi: Denies swallowing difficulty, stomach pain, nausea or vomiting, diarrhea, constipation, bowel incontinence Gu:  Denies bladder incontinence, burning urine Ext:   Denies Joint pain, stiffness or swelling Skin: Denies  skin rash, easy bruising or bleeding or hives Endoc:  Denies polyuria, polydipsia , polyphagia or weight change Psych:   Denies depression, insomnia or hallucinations  Other:  All other systems negative  BP 130/60 (BP Location: Left Arm, Patient Position: Sitting, Cuff Size: Normal)   Pulse 85   Temp (!) 97.3 F (36.3 C) (Temporal)   Ht 5\' 8"  (1.727 m)   Wt 134 lb 3.2 oz (60.9 kg)   SpO2 100%   BMI 20.41 kg/m   Physical Examination:   General Appearance: No distress  Neuro:without focal findings,  speech normal,  HEENT: PERRLA, EOM intact.   Pulmonary: normal breath sounds, No wheezing.  CardiovascularNormal S1,S2.  No m/r/g.   Abdomen: Benign, Soft, non-tender. Renal:  No costovertebral tenderness  GU:  Not performed at this time. Endoc: No evident thyromegaly Skin:   warm, no rashes, no ecchymosis  Extremities: normal, no cyanosis, clubbing. PSYCHIATRIC: Mood, affect within normal limits.   ALL OTHER ROS ARE NEGATIVE   ASSESSMENT / PLAN:  Abnormal CT chest with groundglass opacity in the right lower lobe findings suggest benign etiology with inflammation Status post EMB did not show any type of malignant cells There have been no significant changes over the last several years Consider annual CT scans  Due to previous smoking history patient is at risk for malignancy and this will need to be followed Recommend follow-up CT chest  in 1 year    COVID-19 EDUCATION: The signs and symptoms of COVID-19 were discussed with the patient and how to seek care for testing.  The importance of social distancing was discussed today. Hand Washing Techniques and avoid touching face was  advised.     MEDICATION ADJUSTMENTS/LABS AND TESTS ORDERED: Ct chest in 1 year   CURRENT MEDICATIONS REVIEWED AT LENGTH WITH PATIENT TODAY   Patient satisfied with Plan of action and management. All questions answered  Follow up in 1 year  TOTAL TIME SPENT 21 mins   Renesmae Donahey Patricia Pesa, M.D.  [facility]  Medical Director [place] Director [facility] Cardio-Pulmonary Department

## 2019-12-07 ENCOUNTER — Ambulatory Visit: Payer: Medicare Other | Admitting: Internal Medicine

## 2020-01-17 ENCOUNTER — Ambulatory Visit: Payer: Medicare Other | Admitting: Internal Medicine

## 2020-01-18 ENCOUNTER — Telehealth: Payer: Self-pay | Admitting: Oncology

## 2020-01-18 NOTE — Telephone Encounter (Signed)
Pt left voicemail to reschedule appointment, I called and was unable to reach him. Left VM for pt to return call

## 2020-01-18 NOTE — Telephone Encounter (Signed)
Pt called to have appointment for 12/6 rescheduled. I called and left a VM to return the call.

## 2020-01-20 ENCOUNTER — Other Ambulatory Visit: Payer: Medicare Other

## 2020-01-20 ENCOUNTER — Ambulatory Visit: Payer: Medicare Other | Admitting: Oncology

## 2020-01-23 ENCOUNTER — Inpatient Hospital Stay: Payer: Medicare Other | Admitting: Oncology

## 2020-01-23 ENCOUNTER — Inpatient Hospital Stay: Payer: Medicare Other

## 2020-02-21 ENCOUNTER — Ambulatory Visit: Payer: Medicare Other | Admitting: Internal Medicine

## 2020-03-26 ENCOUNTER — Inpatient Hospital Stay: Payer: Medicare Other | Admitting: Oncology

## 2020-03-26 ENCOUNTER — Inpatient Hospital Stay: Payer: Medicare Other

## 2020-04-03 ENCOUNTER — Ambulatory Visit: Payer: Medicare Other

## 2020-05-23 ENCOUNTER — Other Ambulatory Visit: Payer: Self-pay | Admitting: *Deleted

## 2020-05-23 ENCOUNTER — Telehealth: Payer: Self-pay | Admitting: Internal Medicine

## 2020-05-23 DIAGNOSIS — D75839 Thrombocytosis, unspecified: Secondary | ICD-10-CM

## 2020-05-23 NOTE — Telephone Encounter (Signed)
Copied from [place] [id number]. Topic: Medicare AWV >> May 23, 2020  2:27 PM Cher Nakai R wrote: Reason for CRM:  No answer unable to leave a message for patient to call back and schedule Medicare Annual Wellness Visit (AWV) in office.   If unable to come into the office for AWV,  please offer to do virtually or by telephone.  Last AWV:  03/31/2019  Schedule anytime with [place].  40 minute appointment  Any questions, please contact me at [phone]

## 2020-05-23 NOTE — Telephone Encounter (Signed)
Copied from [place] [id number]. Topic: Medicare AWV >> May 23, 2020  2:27 PM Cher Nakai R wrote: Reason for CRM:  No answer unable to leave a message for patient to call back and schedule Medicare Annual Wellness Visit (AWV) in office.   If unable to come into the office for AWV,  please offer to do virtually or by telephone.  Last AWV:  03/31/2019  Schedule anytime with [place].  40 minute appointment  Any questions, please contact me at [phone]

## 2020-05-24 ENCOUNTER — Inpatient Hospital Stay: Payer: Medicare Other | Admitting: Oncology

## 2020-05-24 ENCOUNTER — Other Ambulatory Visit: Payer: Self-pay | Admitting: *Deleted

## 2020-05-24 ENCOUNTER — Inpatient Hospital Stay: Payer: Medicare Other | Attending: Oncology

## 2020-05-24 ENCOUNTER — Encounter: Payer: Self-pay | Admitting: Oncology

## 2020-05-24 DIAGNOSIS — D631 Anemia in chronic kidney disease: Secondary | ICD-10-CM | POA: Diagnosis not present

## 2020-05-24 DIAGNOSIS — Z87891 Personal history of nicotine dependence: Secondary | ICD-10-CM | POA: Diagnosis not present

## 2020-05-24 DIAGNOSIS — N183 Chronic kidney disease, stage 3 unspecified: Secondary | ICD-10-CM | POA: Insufficient documentation

## 2020-05-24 DIAGNOSIS — D649 Anemia, unspecified: Secondary | ICD-10-CM

## 2020-05-24 DIAGNOSIS — D75839 Thrombocytosis, unspecified: Secondary | ICD-10-CM

## 2020-05-24 LAB — CBC WITH DIFFERENTIAL/PLATELET
Abs Immature Granulocytes: 0.02 10*3/uL (ref 0.00–0.07)
Basophils Absolute: 0 10*3/uL (ref 0.0–0.1)
Basophils Relative: 1 %
Eosinophils Absolute: 0 10*3/uL (ref 0.0–0.5)
Eosinophils Relative: 0 %
HCT: 36.5 % — ABNORMAL LOW (ref 39.0–52.0)
Hemoglobin: 11.7 g/dL — ABNORMAL LOW (ref 13.0–17.0)
Immature Granulocytes: 0 %
Lymphocytes Relative: 24 %
Lymphs Abs: 1.6 10*3/uL (ref 0.7–4.0)
MCH: 31.4 pg (ref 26.0–34.0)
MCHC: 32.1 g/dL (ref 30.0–36.0)
MCV: 97.9 fL (ref 80.0–100.0)
Monocytes Absolute: 0.4 10*3/uL (ref 0.1–1.0)
Monocytes Relative: 6 %
Neutro Abs: 4.8 10*3/uL (ref 1.7–7.7)
Neutrophils Relative %: 69 %
Platelets: 431 10*3/uL — ABNORMAL HIGH (ref 150–400)
RBC: 3.73 MIL/uL — ABNORMAL LOW (ref 4.22–5.81)
RDW: 12.7 % (ref 11.5–15.5)
WBC: 7 10*3/uL (ref 4.0–10.5)
nRBC: 0 % (ref 0.0–0.2)

## 2020-05-24 NOTE — Progress Notes (Signed)
Pt has son and step daughter that checks on him. He has a dog that gives him lots of love and they walk a lot. He has a sister that checks on him. He did get a little sad when he spoke that he has grief pamphlets that he reads every day and he talks to his wife by picture every morning.

## 2020-05-25 NOTE — Progress Notes (Signed)
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 Anemia likely due to chronic disease  Chief complaint/ Reason for visit-routine follow-up of anemia and thrombocytosis  Heme/Onc history: patient is a75 year old Caucasian male with a past medical history significant for diabetes, GERD, hypertension hyperlipidemia anxiety among other medical problems he has been referred to [place] for thrombocytosis. Of note patient has always had thrombocytosis even dating back to 2013 when his platelet count was 484. Since then his platelet count had been ranging between 400's to 500s. In July 2018 his platelet count was 522 and then gradually rose to 566 seconds 728 and then 748 recently. White count has always been normal. He is also had a long-standing normocytic anemia and his hemoglobin ranges around 11. Most recent CBC from 11/14/2016 showed white count of 6.5, H&H of 10.2/31.7 with an MCV of 95 and a platelet count of 748. Iron studies show normal ferritin of 112 and iron saturation of 23% and TIBC was normal at 300  Patient has had a 12 pound weight loss over the last 3 months but states that most of it happened over last 2 months after his disk surgery 8 weeks ago. He states that his appetite is coming back and he is feeling better. He has seen Dr. Ruthell Rummage our practice in the past for iron deficiency anemia  Results of blood work from 11/25/2016 were as follows: CBC showed white count of 5.6, H&H of 11/32.8 and a platelet count of 542. B12 was normal at 257 and folate was normal. Haptoglobin levels  were normal. Reticulocyte count was mildly low at 0.9%. Jak 2 And MPL Were Negative. ESR Was Normal. Peripheral Smear Review Showed Normal Morphology of WBCs RBCs and Platelets. CMP Was within Normal Limits. BCR Abl Testing Was Negative. Multiple Myeloma Panel Revealed No M Protein with Immunofixation Revealed Free Lambda Light Chains  Bone marrow biopsy on 03/17/2017 showed normocellular bone marrow with trilineage hematopoiesis. Numerous lymphoid aggregates and normocytic normochromic anemia. Bone marrow did not show any morphologic features of myeloproliferative or myelodysplastic neoplasm. He did have numerous lymphoid aggregates. Flow cytometry analysis showed a minor lymphoid population of B cells (22%)which were less than 5% of all cells and were coexpressing CD5 and CD20. 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 Cytogenetics and FISH studies were normal. Plasma cells represented 2% of the cells and showed polyclonal staining pattern for kappa and lambda light chains and no evidence of plasma cell neoplasm in the bone marrow  Interval history-patient's wife asked him being January 2022. He is grieving her loss presently.  Health wise he is doing okay.  Reports some ongoing fatigue which is chronic.  Appetite and weight have remained stable  ECOG PS- 1 Pain scale- 0   Review of systems- Review of Systems  Constitutional: Negative for chills, fever, malaise/fatigue and weight loss.  HENT: Negative for congestion, ear discharge and nosebleeds.   Eyes: Negative for blurred vision.  Respiratory: Negative for cough, hemoptysis, sputum production, shortness of breath and wheezing.   Cardiovascular: Negative for chest pain, palpitations, orthopnea and claudication.  Gastrointestinal: Negative for abdominal pain, blood in stool, constipation, diarrhea, heartburn, melena,  nausea and vomiting.  Genitourinary: Negative for dysuria, flank pain, frequency,  hematuria and urgency.  Musculoskeletal: Negative for back pain, joint pain and myalgias.  Skin: Negative for rash.  Neurological: Negative for dizziness, tingling, focal weakness, seizures, weakness and headaches.  Endo/Heme/Allergies: Does not bruise/bleed easily.  Psychiatric/Behavioral: Negative for depression and suicidal ideas. The patient does not have insomnia.        Allergies  Allergen Reactions  . Codeine Nausea Only    Other reaction(s): makes him sick to his stomach Other reaction(s): makes him sick to his stomach     Past Medical History:  Diagnosis Date  . Anemia 08/14/2015  . Anxiety   . Aortic atherosclerosis ([place]) 11/25/2016   Chest CT Oct 2018  . Aorto-iliac atherosclerosis ([place]) 11/25/2016   Abd/pelvic CT Oct 2018  . Coronary artery calcification seen on CAT scan 11/25/2016   Chest CT Oct 2018; refer to Dr. Fletcher Anon  . DDD (degenerative disc disease), cervical 08/21/2016  . Depression   . Diabetes mellitus without complication ([place])   . Facet arthropathy, cervical 08/21/2016  . GERD (gastroesophageal reflux disease)   . Heart murmur    As child  . Hyperlipidemia   . Hypertension   . Hypertrophy of prostate with urinary obstruction 11/25/2016   Pelvic CT October 2018  . Incidental lung nodule, > 5m and < 827m10/10/2016   5 mm nodule LLL, chest CT Oct 2018  . Thrombocytosis 08/14/2015     Past Surgical History:  Procedure Laterality Date  . ANTERIOR CERVICAL DECOMP/DISCECTOMY FUSION N/A 09/24/2016   Procedure: ANTERIOR CERVICAL DECOMPRESSION/DISCECTOMY FUSION 3 LEVELS-C4-7;  Surgeon: YaMeade MawMD;  Location: [facility] ORS;  Service: Neurosurgery;  Laterality: N/A;  . CATARACT EXTRACTION    . ELECTROMAGNETIC NAVIGATION BROCHOSCOPY Right 04/07/2018   Procedure: ELECTROMAGNETIC NAVIGATION BRONCHOSCOPY;  Surgeon: KaFlora LippsMD;  Location: [facility] ORS;  Service: Cardiopulmonary;  Laterality: Right;  . EYE SURGERY Right    Cataract Extraction with IOL  . RETINAL  LASER PROCEDURE      Social History   Socioeconomic History  . Marital status: Married    Spouse name: Not on file  . Number of children: Not on file  . Years of education: Not on file  . Highest education level: Not on file  Occupational History  . Occupation: retired  Tobacco Use  . Smoking status: Former Smoker    Packs/day: 1.00    Years: 30.00    Pack years: 30.00    Types: Cigarettes    Quit date: 07/19/1999    Years since quitting: 20.8  . Smokeless tobacco: Never Used  Vaping Use  . Vaping Use: Never used  Substance and Sexual Activity  . Alcohol use: No    Alcohol/week: 0.0 standard drinks  . Drug use: No  . Sexual activity: Yes    Partners: Female  Other Topics Concern  . Not on file  Social History Narrative  . Not on file   Social Determinants of Health   Financial Resource Strain: Not on file  Food Insecurity: Not on file  Transportation Needs: Not on file  Physical Activity: Not on file  Stress: Not on file  Social Connections: Not on file  Intimate Partner Violence: Not on file    Family History  Problem Relation Age of Onset  . Stroke Mother   . Diabetes Mellitus II Father   . Heart Problems Father   . Heart disease Father   . Diabetes Sister   . Cancer  Maternal Grandfather   . Stroke Paternal Grandmother   . Prostate cancer Neg Hx   . Bladder Cancer Neg Hx   . Kidney cancer Neg Hx      Current Outpatient Medications:  .  acetaminophen (TYLENOL) 500 MG tablet, Take 1,000 mg by mouth 2 (two) times daily as needed. , Disp: , Rfl:  .  aspirin EC 81 MG tablet, Take 81 mg by mouth daily., Disp: , Rfl:  .  atorvastatin (LIPITOR) 40 MG tablet, TAKE 1 TABLET BY MOUTH AT  BEDTIME FOR CHOLESTEROL, Disp: 90 tablet, Rfl: 3 .  bimatoprost (LUMIGAN) 0.01 % SOLN, Place 1 drop into both eyes at bedtime. , Disp: , Rfl:  .  Blood Glucose Monitoring Suppl (ONE TOUCH ULTRA 2) w/Device KIT, OneTouch Ultra 2 device; Dx E11.9, insulin-dependent, LON 99 months;  check fingerstick blood sugars four times a day, fluctuating, needs more frequent monitoring, Disp: 1 each, Rfl: 0 .  finasteride (PROSCAR) 5 MG tablet, Take 1 tablet (5 mg total) by mouth daily., Disp: 90 tablet, Rfl: 3 .  glucose blood (ONETOUCH ULTRA) test strip, USE TO TEST BLOOD SUGARS  THREE TIMES DAILY, Disp: , Rfl:  .  Insulin Detemir (LEVEMIR FLEXTOUCH) 100 UNIT/ML Pen, Inject 30 Units into the skin daily after supper. (Patient taking differently: Inject 20 Units into the skin daily after supper.), Disp: , Rfl:  .  insulin lispro (HUMALOG) 100 UNIT/ML KwikPen, Inject 6 Units into the skin in the morning and at bedtime., Disp: , Rfl:  .  Insulin Pen Needle (PEN NEEDLES) 31G X 6 MM MISC, For use with pen device to inject insulin once a day; LON 99 months, E11.9, Z79.4, Disp: 100 each, Rfl: 3 .  irbesartan (AVAPRO) 150 MG tablet, Take 1 tablet (150 mg total) by mouth daily., Disp: 90 tablet, Rfl: 3 .  metFORMIN (GLUCOPHAGE) 1000 MG tablet, Take 0.5 tablets (500 mg total) by mouth daily with breakfast., Disp: , Rfl:  .  Multiple Vitamin (MULTI-VITAMINS) TABS, Take 1 tablet by mouth 2 (two) times daily. , Disp: , Rfl:  .  ONE TOUCH LANCETS MISC, Check fingerstick blood sugars four times a day; LON 99 months; type 2 diabetes on insulin E11.9, fluctuating sugars, Disp: 200 each, Rfl: 11 .  pioglitazone (ACTOS) 15 MG tablet, Take 15 mg by mouth daily., Disp: , Rfl:  .  SIMBRINZA 1-0.2 % SUSP, Place 1 drop into both eyes 3 (three) times daily. , Disp: , Rfl:  .  tamsulosin (FLOMAX) 0.4 MG CAPS capsule, TAKE 1 CAPSULE BY MOUTH  DAILY, Disp: 90 capsule, Rfl: 3  Physical exam: There were no vitals filed for this visit. Physical Exam Cardiovascular:     Rate and Rhythm: Normal rate and regular rhythm.     Heart sounds: Normal heart sounds.  Pulmonary:     Effort: Pulmonary effort is normal.     Breath sounds: Normal breath sounds.  Abdominal:     General: Bowel sounds are normal.     Palpations:  Abdomen is soft.  Skin:    General: Skin is warm and dry.  Neurological:     Mental Status: He is alert and oriented to person, place, and time.      CMP Latest Ref Rng & Units 08/17/2019  Glucose 65 - 99 mg/dL 349(H)  BUN 7 - 25 mg/dL 26(H)  Creatinine 0.70 - 1.18 mg/dL 1.42(H)  Sodium 135 - 146 mmol/L 139  Potassium 3.5 - 5.3 mmol/L 5.1  Chloride 98 -  110 mmol/L 102  CO2 20 - 32 mmol/L 27  Calcium 8.6 - 10.3 mg/dL 10.1  Total Protein 6.5 - 8.1 g/dL -  Total Bilirubin 0.3 - 1.2 mg/dL -  Alkaline Phos 38 - 126 U/L -  AST 15 - 41 U/L -  ALT 0 - 44 U/L -   CBC Latest Ref Rng & Units 05/24/2020  WBC 4.0 - 10.5 K/uL 7.0  Hemoglobin 13.0 - 17.0 g/dL 11.7(L)  Hematocrit 39.0 - 52.0 % 36.5(L)  Platelets 150 - 400 K/uL 431(H)    Assessment and plan- Patient is a 75 y.o. male who is here for follow-up of following issues  1.  Thrombocytosis: Platelet counts fluctuate between 400-500 without a clear increasing trend.  Myeloproliferative work-up including a bone marrow biopsy has been negative in the past.  Continue to monitor  2.  Normocytic anemia: Presently his hemoglobin is stable between 11-12.  He also has an element of chronic kidney disease with a creatinine of 1.6.  He does not require any EPO at this time.  Repeat CBC ferritin and iron studies in 6 months in 1 year and I will see him back in 1 year   Visit Diagnosis 1. Normocytic anemia   2. Anemia of chronic kidney failure, stage 3 (moderate) ([place])   3. Thrombocytosis      Dr. Randa Evens, MD, MPH [facility] at [facility] [phone] 05/25/2020 2:11 PM

## 2020-06-27 ENCOUNTER — Encounter: Payer: Self-pay | Admitting: Internal Medicine

## 2020-07-05 ENCOUNTER — Ambulatory Visit: Payer: Medicare Other

## 2020-07-12 ENCOUNTER — Other Ambulatory Visit: Payer: Self-pay

## 2020-07-12 ENCOUNTER — Ambulatory Visit (INDEPENDENT_AMBULATORY_CARE_PROVIDER_SITE_OTHER): Payer: Medicare Other

## 2020-07-12 VITALS — BP 120/68 | HR 94 | Temp 98.1°F | Resp 16 | Ht 68.0 in | Wt 132.1 lb

## 2020-07-12 DIAGNOSIS — Z0000 Encounter for general adult medical examination without abnormal findings: Secondary | ICD-10-CM

## 2020-07-12 NOTE — Progress Notes (Signed)
Subjective:   Garrett Yoder. is a 75 y.o. male who presents for Medicare Annual/Subsequent preventive examination.  Review of Systems     Cardiac Risk Factors include: advanced age (>34mn, >>34women);diabetes mellitus;dyslipidemia;male gender;hypertension     Objective:    Today's Vitals   07/12/20 1601  BP: 120/68  Pulse: 94  Resp: 16  Temp: 98.1 F (36.7 C)  TempSrc: Oral  SpO2: 99%  Weight: 132 lb 1.6 oz (59.9 kg)  Height: 5' 8"  (1.727 m)   Body mass index is 20.09 kg/m.  Advanced Directives 07/12/2020 05/24/2020 03/31/2019 04/07/2018 03/29/2018 12/06/2017 12/06/2017  Does Patient Have a Medical Advance Directive? No No No No No No No  Would patient like information on creating a medical advance directive? No - Patient declined Yes (MAU/Ambulatory/Procedural Areas - Information given) Yes (MAU/Ambulatory/Procedural Areas - Information given) No - Patient declined No - Patient declined No - Patient declined -    Current Medications (verified) Outpatient Encounter Medications as of 07/12/2020  Medication Sig  . acetaminophen (TYLENOL) 500 MG tablet Take 1,000 mg by mouth 2 (two) times daily as needed.   .Marland Kitchenaspirin EC 81 MG tablet Take 81 mg by mouth daily.  .Marland Kitchenatorvastatin (LIPITOR) 40 MG tablet TAKE 1 TABLET BY MOUTH AT  BEDTIME FOR CHOLESTEROL  . bimatoprost (LUMIGAN) 0.01 % SOLN Place 1 drop into both eyes at bedtime.   . Blood Glucose Monitoring Suppl (ONE TOUCH ULTRA 2) w/Device KIT OneTouch Ultra 2 device; Dx E11.9, insulin-dependent, LON 99 months; check fingerstick blood sugars four times a day, fluctuating, needs more frequent monitoring  . finasteride (PROSCAR) 5 MG tablet Take 1 tablet (5 mg total) by mouth daily.  .Marland Kitchenglucose blood (ONETOUCH ULTRA) test strip USE TO TEST BLOOD SUGARS  THREE TIMES DAILY  . insulin detemir (LEVEMIR FLEXTOUCH) 100 UNIT/ML FlexPen Inject 26 Units into the skin every evening.  . insulin lispro (HUMALOG) 100 UNIT/ML KwikPen Inject 6  Units into the skin daily. Pt taking 6 units in AM, 10 units at noon & 20 units at supper  . Insulin Pen Needle (PEN NEEDLES) 31G X 6 MM MISC For use with pen device to inject insulin once a day; LON 99 months, E11.9, Z79.4  . irbesartan (AVAPRO) 150 MG tablet Take 1 tablet (150 mg total) by mouth daily.  . metFORMIN (GLUCOPHAGE) 1000 MG tablet Take 0.5 tablets (500 mg total) by mouth daily with breakfast.  . Multiple Vitamin (MULTI-VITAMINS) TABS Take 1 tablet by mouth 2 (two) times daily.   . ONE TOUCH LANCETS MISC Check fingerstick blood sugars four times a day; LON 99 months; type 2 diabetes on insulin E11.9, fluctuating sugars  . pioglitazone (ACTOS) 15 MG tablet Take 15 mg by mouth daily.  .Marland KitchenSIMBRINZA 1-0.2 % SUSP Place 1 drop into both eyes 3 (three) times daily.   . tamsulosin (FLOMAX) 0.4 MG CAPS capsule TAKE 1 CAPSULE BY MOUTH  DAILY  . [DISCONTINUED] Insulin Detemir (LEVEMIR FLEXTOUCH) 100 UNIT/ML Pen Inject 30 Units into the skin daily after supper. (Patient taking differently: Inject 20 Units into the skin daily after supper.)   No facility-administered encounter medications on file as of 07/12/2020.    Allergies (verified) Codeine   History: Past Medical History:  Diagnosis Date  . Anemia 08/14/2015  . Anxiety   . [place]ortic atherosclerosis ([place] 11/25/2016   Chest CT Oct 2018  . A[place]ac atherosclerosis ([facility] 11/25/2016   Abd/pelvic CT Oct 2018  . Coronary artery calcification seen  on CAT scan 11/25/2016   Chest CT Oct 2018; refer to Dr. Fletcher Anon  . DDD (degenerative disc disease), cervical 08/21/2016  . Depression   . Diabetes mellitus without complication ([place])   . Facet arthropathy, cervical 08/21/2016  . GERD (gastroesophageal reflux disease)   . Heart murmur    As child  . Hyperlipidemia   . Hypertension   . Hypertrophy of prostate with urinary obstruction 11/25/2016   Pelvic CT October 2018  . Incidental lung nodule, > 3m and < 848m10/10/2016   5 mm nodule LLL, chest CT  Oct 2018  . Thrombocytosis 08/14/2015   Past Surgical History:  Procedure Laterality Date  . ANTERIOR CERVICAL DECOMP/DISCECTOMY FUSION N/A 09/24/2016   Procedure: ANTERIOR CERVICAL DECOMPRESSION/DISCECTOMY FUSION 3 LEVELS-C4-7;  Surgeon: YaMeade MawMD;  Location: [facility] ORS;  Service: Neurosurgery;  Laterality: N/A;  . CATARACT EXTRACTION    . ELECTROMAGNETIC NAVIGATION BROCHOSCOPY Right 04/07/2018   Procedure: ELECTROMAGNETIC NAVIGATION BRONCHOSCOPY;  Surgeon: KaFlora LippsMD;  Location: [facility] ORS;  Service: Cardiopulmonary;  Laterality: Right;  . EYE SURGERY Right    Cataract Extraction with IOL  . RETINAL LASER PROCEDURE     Family History  Problem Relation Age of Onset  . Stroke Mother   . Diabetes Mellitus II Father   . Heart Problems Father   . Heart disease Father   . Diabetes Sister   . Cancer Maternal Grandfather   . Stroke Paternal Grandmother   . Prostate cancer Neg Hx   . Bladder Cancer Neg Hx   . Kidney cancer Neg Hx    Social History   Socioeconomic History  . Marital status: Widowed    Spouse name: Not on file  . Number of children: Not on file  . Years of education: Not on file  . Highest education level: Not on file  Occupational History  . Occupation: retired  Tobacco Use  . Smoking status: Former Smoker    Packs/day: 1.00    Years: 30.00    Pack years: 30.00    Types: Cigarettes    Quit date: 07/19/1999    Years since quitting: 20.9  . Smokeless tobacco: Never Used  Vaping Use  . Vaping Use: Never used  Substance and Sexual Activity  . Alcohol use: No    Alcohol/week: 0.0 standard drinks  . Drug use: No  . Sexual activity: Yes    Partners: Female  Other Topics Concern  . Not on file  Social History Narrative  . Not on file   Social Determinants of Health   Financial Resource Strain: Low Risk   . Difficulty of Paying Living Expenses: Not hard at all  Food Insecurity: No Food Insecurity  . Worried About RuCharity fundraisern the Last  Year: Never true  . Ran Out of Food in the Last Year: Never true  Transportation Needs: No Transportation Needs  . Lack of Transportation (Medical): No  . Lack of Transportation (Non-Medical): No  Physical Activity: Sufficiently Active  . Days of Exercise per Week: 7 days  . Minutes of Exercise per Session: 30 min  Stress: Stress Concern Present  . Feeling of Stress : To some extent  Social Connections: Moderately Isolated  . Frequency of Communication with Friends and Family: More than three times a week  . Frequency of Social Gatherings with Friends and Family: Twice a week  . Attends Religious Services: More than 4 times per year  . Active Member of Clubs or Organizations: No  .  Attends Archivist Meetings: Never  . Marital Status: Widowed    Tobacco Counseling Counseling given: Not Answered   Clinical Intake:  Pre-visit preparation completed: Yes  Pain : No/denies pain     BMI - recorded: 20.09 Nutritional Status: BMI of 19-24  Normal Nutritional Risks: None Diabetes: Yes CBG done?: No Did pt. bring in CBG monitor from home?: No  How often do you need to have someone help you when you read instructions, pamphlets, or other written materials from your doctor or pharmacy?: 1 - Never  Nutrition Risk Assessment:  Has the patient had any N/V/D within the last 2 months?  No  Does the patient have any non-healing wounds?  No  Has the patient had any unintentional weight loss or weight gain?  No   Diabetes:  Is the patient diabetic?  Yes  If diabetic, was a CBG obtained today?  No  Did the patient bring in their glucometer from home?  No  How often do you monitor your CBG's? 1-2 times daily.   Financial Strains and Diabetes Management:  Are you having any financial strains with the device, your supplies or your medication? No .  Does the patient want to be seen by Chronic Care Management for management of their diabetes?  No  Would the patient like to be  referred to a Nutritionist or for Diabetic Management?  No   Diabetic Exams:  Diabetic Eye Exam: Completed 06/27/20.   Diabetic Foot Exam: Completed 04/25/20.  Interpreter Needed?: No  Information entered by :: Clemetine Marker LPN   Activities of Daily Living In your present state of health, do you have any difficulty performing the following activities: 07/12/2020 08/17/2019  Hearing? N Y  Comment declines hearing aids -  Vision? N N  Difficulty concentrating or making decisions? N N  Walking or climbing stairs? N N  Dressing or bathing? N N  Doing errands, shopping? N N  Preparing Food and eating ? N -  Using the Toilet? N -  In the past six months, have you accidently leaked urine? N -  Do you have problems with loss of bowel control? N -  Managing your Medications? N -  Managing your Finances? N -  Housekeeping or managing your Housekeeping? N -  Some recent data might be hidden    Patient Care Team: [place] as PCP - General (Family Medicine) Solum, Betsey Holiday, MD as Physician Assistant (Endocrinology) Sindy Guadeloupe, MD as Consulting Physician (Oncology) Abbie Sons, MD (Urology)  Indicate any recent Medical Services you may have received from other than [facility] providers in the past year (date may be approximate).     Assessment:   This is a routine wellness examination for Jasiyah.  Hearing/Vision screen  Hearing Screening   125Hz  250Hz  500Hz  1000Hz  2000Hz  3000Hz  4000Hz  6000Hz  8000Hz   Right ear:           Left ear:           Comments: Pt reports mild hearing difficulty at times. Declines hearing aids  Vision Screening Comments: [facility] screen[facility] Dr. Michelene Heady  Dietary issues and exercise activities discussed: Current Exercise Habits: Home exercise routine, Type of exercise: walking, Time (Minutes): 30, Frequency (Times/Week): 7, Weekly Exercise (Minutes/Week): 210, Intensity: Mild, Exercise limited by: None  identified  Goals Addressed   None    Depression Screen PHQ 2/9 Scores 07/12/2020 08/17/2019 06/07/2019 03/31/2019 11/29/2018 05/28/2018 01/26/2018  PHQ -  2 Score 2 0 0 0 0 0 0  PHQ- 9 Score 3 0 0 - 0 0 0    Fall Risk Fall Risk  07/12/2020 08/17/2019 06/07/2019 03/31/2019 11/29/2018  Falls in the past year? 0 0 0 0 0  Number falls in past yr: 0 0 0 0 0  Injury with Fall? 0 0 0 0 0  Risk for fall due to : No Fall Risks - - No Fall Risks -  Follow up Falls prevention discussed - - Falls prevention discussed Falls evaluation completed    FALL RISK PREVENTION PERTAINING TO THE HOME:  Any stairs in or around the home? Yes  If so, are there any without handrails? Yes  2 steps outside Home free of loose throw rugs in walkways, pet beds, electrical cords, etc? Yes  Adequate lighting in your home to reduce risk of falls? Yes   ASSISTIVE DEVICES UTILIZED TO PREVENT FALLS:  Life alert? No  Use of a cane, walker or w/c? No  Grab bars in the bathroom? Yes  Shower chair or bench in shower? Yes  Elevated toilet seat or a handicapped toilet? No   TIMED UP AND GO:  Was the test performed? Yes .  Length of time to ambulate 10 feet: 5 sec.   Gait steady and fast without use of assistive device  Cognitive Function: Normal cognitive status assessed by direct observation by this Nurse Health Advisor. No abnormalities found.       6CIT Screen 03/31/2019  What Year? 0 points  What month? 0 points  What time? 0 points  Count back from 20 0 points  Months in reverse 0 points  Repeat phrase 0 points  Total Score 0    Immunizations Immunization History  Administered Date(s) Administered  . Fluad Quad(high Dose 65+) 11/29/2018  . Influenza, High Dose Seasonal PF 11/20/2014, 01/17/2016, 11/25/2016, 01/26/2018  . Influenza,inj,Quad PF,6+ Mos 12/26/2013  . Influenza-Unspecified 01/18/2008, 11/28/2008, 01/14/2010, 12/10/2010, 02/02/2012, 02/01/2013, 12/26/2013  . PFIZER(Purple Top)SARS-COV-2  Vaccination 05/13/2019, 06/03/2019  . Pneumococcal Conjugate-13 04/21/2013  . Pneumococcal Polysaccharide-23 08/27/2011  . Td 06/30/2006  . Zoster, Live 02/02/2012    TDAP status: Due, Education has been provided regarding the importance of this vaccine. Advised may receive this vaccine at local pharmacy or Health Dept. Aware to provide a copy of the vaccination record if obtained from local pharmacy or Health Dept. Verbalized acceptance and understanding.  Flu Vaccine status: Due, Education has been provided regarding the importance of this vaccine. Advised may receive this vaccine at local pharmacy or Health Dept. Aware to provide a copy of the vaccination record if obtained from local pharmacy or Health Dept. Verbalized acceptance and understanding.  Pneumococcal vaccine status: Up to date  Covid-19 vaccine status: Completed vaccines  Qualifies for Shingles Vaccine? Yes   Zostavax completed Yes   Shingrix Completed?: No.    Education has been provided regarding the importance of this vaccine. Patient has been advised to call insurance company to determine out of pocket expense if they have not yet received this vaccine. Advised may also receive vaccine at local pharmacy or Health Dept. Verbalized acceptance and understanding.  Screening Tests Health Maintenance  Topic Date Due  . Hepatitis C Screening  Never done  . Zoster Vaccines- Shingrix (1 of 2) Never done  . COVID-19 Vaccine (3 - Pfizer risk 4-dose series) 07/01/2019  . TETANUS/TDAP  02/17/2021 (Originally 06/29/2016)  . INFLUENZA VACCINE  09/17/2020  . HEMOGLOBIN A1C  10/26/2020  . FOOT  EXAM  04/25/2021  . OPHTHALMOLOGY EXAM  06/21/2021  . COLONOSCOPY (Pts 45-69yr Insurance coverage will need to be confirmed)  09/06/2022  . PNA vac Low Risk Adult  Completed  . HPV VACCINES  Aged Out    Health Maintenance  Health Maintenance Due  Topic Date Due  . Hepatitis C Screening  Never done  . Zoster Vaccines- Shingrix (1 of 2)  Never done  . COVID-19 Vaccine (3 - Pfizer risk 4-dose series) 07/01/2019    Colorectal cancer screening: Type of screening: Colonoscopy. Completed 09/05/12. Repeat every 10 years  Lung Cancer Screening: (Low Dose CT Chest recommended if Age 74-80years, 30 pack-year currently smoking OR have quit w/in 15years.) does not qualify.   Additional Screening:  Hepatitis C Screening: does qualify; postponed  Vision Screening: Recommended annual ophthalmology exams for early detection of glaucoma and other disorders of the eye. Is the patient up to date with their annual eye exam?  Yes  Who is the provider or what is the name of the [facility] which the [facility]   Dental Screening: Recommended annual dental exams for proper oral hygiene  Community Resource Referral / Chronic Care Management: CRR required this visit?  No   CCM required this visit?  No      Plan:     I have personally reviewed and noted the following in the patient's chart:   . Medical and social history . Use of alcohol, tobacco or illicit drugs  . Current medications and supplements including opioid prescriptions. Patient is not currently taking opioid prescriptions. . Functional ability and status . Nutritional status . Physical activity . Advanced directives . List of other physicians . Hospitalizations, surgeries, and ER visits in previous 12 months . Vitals . Screenings to include cognitive, depression, and falls . Referrals and appointments  In addition, I have reviewed and discussed with patient certain preventive protocols, quality metrics, and best practice recommendations. A written personalized care plan for preventive services as well as general preventive health recommendations were provided to patient.     KClemetine Marker LPN   [id number]/07/[id number]  Nurse Notes: pt advised due for labs and OV; [facility]

## 2020-07-12 NOTE — Patient Instructions (Signed)
Garrett Yoder , Thank you for taking time to come for your Medicare Wellness Visit. I appreciate your ongoing commitment to your health goals. Please review the following plan we discussed and let me know if I can assist you in the future.   Screening recommendations/referrals: Colonoscopy: done 09/05/12. Repeat in 2024.  Recommended yearly ophthalmology/optometry visit for glaucoma screening and checkup Recommended yearly dental visit for hygiene and checkup  Vaccinations: Influenza vaccine: postponed Pneumococcal vaccine: done 04/21/13 Tdap vaccine: due Shingles vaccine: Shingrix discussed. Please contact your pharmacy for coverage information.  Covid-19:  Done 05/13/19 & 06/03/19  Advanced directives: Please bring a copy of your health care power of attorney and living will to the office at your convenience once you have completed those documents.   Conditions/risks identified: Recommend healthy eating and goal to lower A1c to less than 7%  Next appointment: Follow up in one year for your annual wellness visit.   Preventive Care 18 Years and Older, Male Preventive care refers to lifestyle choices and visits with your health care provider that can promote health and wellness. What does preventive care include?  A yearly physical exam. This is also called an annual well check.  Dental exams once or twice a year.  Routine eye exams. Ask your health care provider how often you should have your eyes checked.  Personal lifestyle choices, including:  Daily care of your teeth and gums.  Regular physical activity.  Eating a healthy diet.  Avoiding tobacco and drug use.  Limiting alcohol use.  Practicing safe sex.  Taking low doses of aspirin every day.  Taking vitamin and mineral supplements as recommended by your health care provider. What happens during an annual well check? The services and screenings done by your health care provider during your annual well check will depend  on your age, overall health, lifestyle risk factors, and family history of disease. Counseling  Your health care provider may ask you questions about your:  Alcohol use.  Tobacco use.  Drug use.  Emotional well-being.  Home and relationship well-being.  Sexual activity.  Eating habits.  History of falls.  Memory and ability to understand (cognition).  Work and work Statistician. Screening  You may have the following tests or measurements:  Height, weight, and BMI.  Blood pressure.  Lipid and cholesterol levels. These may be checked every 5 years, or more frequently if you are over 30 years old.  Skin check.  Lung cancer screening. You may have this screening every year starting at age 71 if you have a 30-pack-year history of smoking and currently smoke or have quit within the past 15 years.  Fecal occult blood test (FOBT) of the stool. You may have this test every year starting at age 49.  Flexible sigmoidoscopy or colonoscopy. You may have a sigmoidoscopy every 5 years or a colonoscopy every 10 years starting at age 68.  Prostate cancer screening. Recommendations will vary depending on your family history and other risks.  Hepatitis C blood test.  Hepatitis B blood test.  Sexually transmitted disease (STD) testing.  Diabetes screening. This is done by checking your blood sugar (glucose) after you have not eaten for a while (fasting). You may have this done every 1-3 years.  Abdominal aortic aneurysm (AAA) screening. You may need this if you are a current or former smoker.  Osteoporosis. You may be screened starting at age 69 if you are at high risk. Talk with your health care provider about your test results,  treatment options, and if necessary, the need for more tests. Vaccines  Your health care provider may recommend certain vaccines, such as:  Influenza vaccine. This is recommended every year.  Tetanus, diphtheria, and acellular pertussis (Tdap, Td)  vaccine. You may need a Td booster every 10 years.  Zoster vaccine. You may need this after age 40.  Pneumococcal 13-valent conjugate (PCV13) vaccine. One dose is recommended after age 35.  Pneumococcal polysaccharide (PPSV23) vaccine. One dose is recommended after age 54. Talk to your health care provider about which screenings and vaccines you need and how often you need them. This information is not intended to replace advice given to you by your health care provider. Make sure you discuss any questions you have with your health care provider. Document Released: 03/02/2015 Document Revised: 10/24/2015 Document Reviewed: 12/05/2014 Elsevier Interactive Patient Education  2017 [employer] Prevention in the Home Falls can cause injuries. They can happen to people of all ages. There are many things you can do to make your home safe and to help prevent falls. What can I do on the outside of my home?  Regularly fix the edges of walkways and driveways and fix any cracks.  Remove anything that might make you trip as you walk through a door, such as a raised step or threshold.  Trim any bushes or trees on the path to your home.  Use bright outdoor lighting.  Clear any walking paths of anything that might make someone trip, such as rocks or tools.  Regularly check to see if handrails are loose or broken. Make sure that both sides of any steps have handrails.  Any raised decks and porches should have guardrails on the edges.  Have any leaves, snow, or ice cleared regularly.  Use sand or salt on walking paths during winter.  Clean up any spills in your garage right away. This includes oil or grease spills. What can I do in the bathroom?  Use night lights.  Install grab bars by the toilet and in the tub and shower. Do not use towel bars as grab bars.  Use non-skid mats or decals in the tub or shower.  If you need to sit down in the shower, use a plastic, non-slip  stool.  Keep the floor dry. Clean up any water that spills on the floor as soon as it happens.  Remove soap buildup in the tub or shower regularly.  Attach bath mats securely with double-sided non-slip rug tape.  Do not have throw rugs and other things on the floor that can make you trip. What can I do in the bedroom?  Use night lights.  Make sure that you have a light by your bed that is easy to reach.  Do not use any sheets or blankets that are too big for your bed. They should not hang down onto the floor.  Have a firm chair that has side arms. You can use this for support while you get dressed.  Do not have throw rugs and other things on the floor that can make you trip. What can I do in the kitchen?  Clean up any spills right away.  Avoid walking on wet floors.  Keep items that you use a lot in easy-to-reach places.  If you need to reach something above you, use a strong step stool that has a grab bar.  Keep electrical cords out of the way.  Do not use floor polish or wax that makes floors  slippery. If you must use wax, use non-skid floor wax.  Do not have throw rugs and other things on the floor that can make you trip. What can I do with my stairs?  Do not leave any items on the stairs.  Make sure that there are handrails on both sides of the stairs and use them. Fix handrails that are broken or loose. Make sure that handrails are as long as the stairways.  Check any carpeting to make sure that it is firmly attached to the stairs. Fix any carpet that is loose or worn.  Avoid having throw rugs at the top or bottom of the stairs. If you do have throw rugs, attach them to the floor with carpet tape.  Make sure that you have a light switch at the top of the stairs and the bottom of the stairs. If you do not have them, ask someone to add them for you. What else can I do to help prevent falls?  Wear shoes that:  Do not have high heels.  Have rubber bottoms.  Are  comfortable and fit you well.  Are closed at the toe. Do not wear sandals.  If you use a stepladder:  Make sure that it is fully opened. Do not climb a closed stepladder.  Make sure that both sides of the stepladder are locked into place.  Ask someone to hold it for you, if possible.  Clearly mark and make sure that you can see:  Any grab bars or handrails.  First and last steps.  Where the edge of each step is.  Use tools that help you move around (mobility aids) if they are needed. These include:  Canes.  Walkers.  Scooters.  Crutches.  Turn on the lights when you go into a dark area. Replace any light bulbs as soon as they burn out.  Set up your furniture so you have a clear path. Avoid moving your furniture around.  If any of your floors are uneven, fix them.  If there are any pets around you, be aware of where they are.  Review your medicines with your doctor. Some medicines can make you feel dizzy. This can increase your chance of falling. Ask your doctor what other things that you can do to help prevent falls. This information is not intended to replace advice given to you by your health care provider. Make sure you discuss any questions you have with your health care provider. Document Released: 11/30/2008 Document Revised: 07/12/2015 Document Reviewed: 03/10/2014 Elsevier Interactive Patient Education  2017 [employer].

## 2020-07-27 ENCOUNTER — Other Ambulatory Visit: Payer: Self-pay

## 2020-07-27 NOTE — Telephone Encounter (Signed)
Lvm that patient needs an appt with a provider for med refills

## 2020-07-27 NOTE — Telephone Encounter (Signed)
PT does not have any upcoming appts and has not been seen since

## 2020-07-27 NOTE — Telephone Encounter (Signed)
Pt needs to schedule appt before refills can be sent in

## 2020-08-01 ENCOUNTER — Telehealth: Payer: Self-pay

## 2020-08-01 MED ORDER — IRBESARTAN 150 MG PO TABS: 150.0000 mg | ORAL_TABLET | Freq: Every day | ORAL | 0 refills | Status: DC

## 2020-08-01 MED ORDER — ATORVASTATIN CALCIUM 40 MG PO TABS: ORAL_TABLET | ORAL | 0 refills | Status: DC

## 2020-08-01 NOTE — Telephone Encounter (Signed)
Pt is scheduled °

## 2020-08-01 NOTE — Telephone Encounter (Signed)
Pt needs appt for further refills. 

## 2020-08-16 ENCOUNTER — Ambulatory Visit (INDEPENDENT_AMBULATORY_CARE_PROVIDER_SITE_OTHER): Payer: Medicare Other | Admitting: Unknown Physician Specialty

## 2020-08-16 ENCOUNTER — Encounter: Payer: Self-pay | Admitting: Unknown Physician Specialty

## 2020-08-16 ENCOUNTER — Other Ambulatory Visit: Payer: Self-pay

## 2020-08-16 DIAGNOSIS — N1832 Chronic kidney disease, stage 3b: Secondary | ICD-10-CM | POA: Diagnosis not present

## 2020-08-16 DIAGNOSIS — D649 Anemia, unspecified: Secondary | ICD-10-CM

## 2020-08-16 MED ORDER — IRBESARTAN 150 MG PO TABS: 150.0000 mg | ORAL_TABLET | Freq: Every day | ORAL | 0 refills | Status: DC

## 2020-08-16 MED ORDER — TAMSULOSIN HCL 0.4 MG PO CAPS: 0.4000 mg | ORAL_CAPSULE | Freq: Every day | ORAL | 3 refills | Status: DC

## 2020-08-16 MED ORDER — ATORVASTATIN CALCIUM 40 MG PO TABS: ORAL_TABLET | ORAL | 0 refills | Status: DC

## 2020-08-16 NOTE — Progress Notes (Signed)
BP 136/72   Pulse 88   Temp 98.8 F (37.1 C)   Resp 16   Ht 5\' 5"  (1.651 m)   Wt 132 lb (59.9 kg)   SpO2 99%   BMI 21.97 kg/m    Subjective:    Patient ID: Garrett Yoder., male    DOB: 08-02-1945, 75 y.o.   MRN: [id number]  HPI: Garrett Yoder. is a 75 y.o. male  Chief Complaint  Patient presents with   Follow-up   Diabetes- Followed by Dr. Gabriel Carina  Last Hgb A1C was 7.7.  This is improving as he was struggling with wife's passing about 6 month ago  Hypertension Using medications without difficulty Average home BPs SBP 115 or less   No problems or lightheadedness No chest pain with exertion or shortness of breath No Edema  Hyperlipidemia Lipids checked 6/21 with lipids 68 Using medications without problems: No Muscle aches  Diet compliance: No special diet Exercise: walks with dog every AM  BPH Taking on Flomax and Finesteride.  States working well.  Seeing Urology  CKD Last GFR was 42 on 6/21.  Does not see Nephrology  Relevant past medical, surgical, family and social history reviewed and updated as indicated. Interim medical history since our last visit reviewed. Allergies and medications reviewed and updated.  Review of Systems  Per HPI unless specifically indicated above     Objective:    BP 136/72   Pulse 88   Temp 98.8 F (37.1 C)   Resp 16   Ht 5\' 5"  (1.651 m)   Wt 132 lb (59.9 kg)   SpO2 99%   BMI 21.97 kg/m   Wt Readings from Last 3 Encounters:  08/16/20 132 lb (59.9 kg)  07/12/20 132 lb 1.6 oz (59.9 kg)  11/22/19 134 lb 3.2 oz (60.9 kg)    Physical Exam Constitutional:      General: He is not in acute distress.    Appearance: Normal appearance. He is well-developed.  HENT:     Head: Normocephalic and atraumatic.  Eyes:     General: Lids are normal. No scleral icterus.       Right eye: No discharge.        Left eye: No discharge.     Conjunctiva/sclera: Conjunctivae normal.  Neck:     Vascular: No carotid bruit or JVD.   Cardiovascular:     Rate and Rhythm: Normal rate and regular rhythm.     Heart sounds: Normal heart sounds.  Pulmonary:     Effort: Pulmonary effort is normal. No respiratory distress.     Breath sounds: Normal breath sounds.  Abdominal:     Palpations: There is no hepatomegaly or splenomegaly.  Musculoskeletal:        General: Normal range of motion.     Cervical back: Normal range of motion and neck supple.  Skin:    General: Skin is warm and dry.     Coloration: Skin is not pale.     Findings: No rash.  Neurological:     Mental Status: He is alert and oriented to person, place, and time.  Psychiatric:        Behavior: Behavior normal.        Thought Content: Thought content normal.        Judgment: Judgment normal.    Results for orders placed or performed in visit on 05/24/20  CBC with Differential/Platelet  Result Value Ref Range   WBC 7.0 4.0 -  10.5 K/uL   RBC 3.73 (L) 4.22 - 5.81 MIL/uL   Hemoglobin 11.7 (L) 13.0 - 17.0 g/dL   HCT 36.5 (L) 39.0 - 52.0 %   MCV 97.9 80.0 - 100.0 fL   MCH 31.4 26.0 - 34.0 pg   MCHC 32.1 30.0 - 36.0 g/dL   RDW 12.7 11.5 - 15.5 %   Platelets 431 (H) 150 - 400 K/uL   nRBC 0.0 0.0 - 0.2 %   Neutrophils Relative % 69 %   Neutro Abs 4.8 1.7 - 7.7 K/uL   Lymphocytes Relative 24 %   Lymphs Abs 1.6 0.7 - 4.0 K/uL   Monocytes Relative 6 %   Monocytes Absolute 0.4 0.1 - 1.0 K/uL   Eosinophils Relative 0 %   Eosinophils Absolute 0.0 0.0 - 0.5 K/uL   Basophils Relative 1 %   Basophils Absolute 0.0 0.0 - 0.1 K/uL   Immature Granulocytes 0 %   Abs Immature Granulocytes 0.02 0.00 - 0.07 K/uL      Assessment & Plan:   Problem List Items Addressed This Visit       Unprioritized   Anemia    Anemia of chronic disease.  Labs in June show this is stable       Chronic kidney disease, stage 3b ([place])    Pt with GFR mid 40s.  Has not seen a nephrologist and wants to defer for now.  Counseled to avoid NSAIDs.  On an ARB         Follow  up plan: Return in about 3 months (around 11/16/2020), or With a new permanent provider.

## 2020-08-16 NOTE — Assessment & Plan Note (Signed)
Anemia of chronic disease.  Labs in June show this is stable

## 2020-08-16 NOTE — Assessment & Plan Note (Signed)
Pt with GFR mid 40s.  Has not seen a nephrologist and wants to defer for now.  Counseled to avoid NSAIDs.  On an ARB

## 2020-08-17 ENCOUNTER — Other Ambulatory Visit: Payer: Self-pay | Admitting: Urology

## 2020-08-29 ENCOUNTER — Encounter: Payer: Self-pay | Admitting: Urology

## 2020-08-29 ENCOUNTER — Other Ambulatory Visit: Payer: Self-pay

## 2020-08-29 ENCOUNTER — Ambulatory Visit (INDEPENDENT_AMBULATORY_CARE_PROVIDER_SITE_OTHER): Payer: Medicare Other | Admitting: Urology

## 2020-08-29 VITALS — BP 133/61 | HR 139 | Ht 68.0 in | Wt 135.0 lb

## 2020-08-29 DIAGNOSIS — R351 Nocturia: Secondary | ICD-10-CM | POA: Diagnosis not present

## 2020-08-29 DIAGNOSIS — N401 Enlarged prostate with lower urinary tract symptoms: Secondary | ICD-10-CM

## 2020-08-29 LAB — MICROSCOPIC EXAMINATION
Bacteria, UA: NONE SEEN
RBC, Urine: NONE SEEN /hpf (ref 0–2)
WBC, UA: NONE SEEN /hpf (ref 0–5)

## 2020-08-29 LAB — URINALYSIS, COMPLETE
Bilirubin, UA: NEGATIVE
Leukocytes,UA: NEGATIVE
Nitrite, UA: NEGATIVE
RBC, UA: NEGATIVE
Specific Gravity, UA: 1.025 (ref 1.005–1.030)
Urobilinogen, Ur: 0.2 mg/dL (ref 0.2–1.0)
pH, UA: 6 (ref 5.0–7.5)

## 2020-08-29 LAB — BLADDER SCAN AMB NON-IMAGING: Scan Result: 11

## 2020-08-29 NOTE — Progress Notes (Signed)
08/29/2020 7:22 AM   Garrett Yoder 1945/12/03 [id number]  Referring provider: Towanda Malkin, MD [address] [place],  [place] [address]  Chief Complaint  Patient presents with   Benign Prostatic Hypertrophy    Urologic history: 1.  BPH with lower urinary tract symptoms -Long-term tamsulosin -Finasteride added 12/2016 for worsening frequency/urgency with marked improvement in symptoms   HPI: 75 y.o. male presents for annual follow-up.  No significant complaints since last years visit Remains on tamsulosin and finasteride States over the last year his nocturia has significantly improved and is minimal Denies dysuria, gross hematuria Denies flank, abdominal or pelvic pain IPSS: 6/35     PMH: Past Medical History:  Diagnosis Date   Anemia 08/14/2015   Anxiety    Aortic atherosclerosis ([place]) 11/25/2016   Chest CT Oct 2018   Aorto-iliac atherosclerosis ([place]) 11/25/2016   Abd/pelvic CT Oct 2018   Coronary artery calcification seen on CAT scan 11/25/2016   Chest CT Oct 2018; refer to Dr. 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   GERD (gastroesophageal reflux disease)    Heart murmur    As child   Hyperlipidemia    Hypertension    Hypertrophy of prostate with urinary obstruction 11/25/2016   Pelvic CT October 2018   Incidental lung nodule, > 69m and < 847m10/10/2016   5 mm nodule LLL, chest CT Oct 2018   Thrombocytosis 08/14/2015    Surgical History: Past Surgical History:  Procedure Laterality Date   ANTERIOR CERVICAL DECOMP/DISCECTOMY FUSION N/A 09/24/2016   Procedure: ANTERIOR CERVICAL DECOMPRESSION/DISCECTOMY FUSION 3 LEVELS-C4-7;  Surgeon: YaMeade MawMD;  Location: [facility] ORS;  Service: Neurosurgery;  Laterality: N/A;   CATARACT EXTRACTION     ELECTROMAGNETIC NAVIGATION BROCHOSCOPY Right 04/07/2018   Procedure:  ELECTROMAGNETIC NAVIGATION BRONCHOSCOPY;  Surgeon: KaFlora LippsMD;  Location: [facility] ORS;  Service: Cardiopulmonary;  Laterality: Right;   EYE SURGERY Right    Cataract Extraction with IOL   RETINAL LASER PROCEDURE      Home Medications:  Allergies as of 08/29/2020       Reactions   Codeine Nausea Only   Other reaction(s): makes him sick to his stomach Other reaction(s): makes him sick to his stomach        Medication List        Accurate as of August 29, 2020 11:59 PM. If you have any questions, ask your nurse or doctor.          acetaminophen 500 MG tablet Commonly known as: TYLENOL Take 1,000 mg by mouth 2 (two) times daily as needed.   aspirin EC 81 MG tablet Take 81 mg by mouth daily.   atorvastatin 40 MG tablet Commonly known as: LIPITOR TAKE 1 TABLET BY MOUTH AT  BEDTIME FOR CHOLESTEROL   bimatoprost 0.01 % Soln Commonly known as: LUMIGAN Place 1 drop into both eyes at bedtime.   finasteride 5 MG tablet Commonly known as: PROSCAR TAKE 1 TABLET BY MOUTH  DAILY   insulin lispro 100 UNIT/ML KwikPen Commonly known as: HUMALOG Inject 6 Units into the skin daily. Pt taking 6 units in AM, 10 units at noon & 20 units at supper   irbesartan 150 MG tablet Commonly known as: AVAPRO Take 1 tablet (150 mg total) by mouth daily.   Levemir FlexTouch 100 UNIT/ML FlexPen Generic drug: insulin detemir Inject 26 Units  into the skin every evening.   metFORMIN 1000 MG tablet Commonly known as: GLUCOPHAGE Take 0.5 tablets (500 mg total) by mouth daily with breakfast.   Multi-Vitamins Tabs Take 1 tablet by mouth 2 (two) times daily.   ONE TOUCH LANCETS Misc Check fingerstick blood sugars four times a day; LON 99 months; type 2 diabetes on insulin E11.9, fluctuating sugars   ONE TOUCH ULTRA 2 w/Device Kit OneTouch Ultra 2 device; Dx E11.9, insulin-dependent, LON 99 months; check fingerstick blood sugars four times a day, fluctuating, needs more frequent monitoring    OneTouch Ultra test strip Generic drug: glucose blood USE TO TEST BLOOD SUGARS  THREE TIMES DAILY   Pen Needles 31G X 6 MM Misc For use with pen device to inject insulin once a day; LON 99 months, E11.9, Z79.4   pioglitazone 15 MG tablet Commonly known as: ACTOS Take 15 mg by mouth daily.   Simbrinza 1-0.2 % Susp Generic drug: Brinzolamide-Brimonidine Place 1 drop into both eyes 3 (three) times daily.   tamsulosin 0.4 MG Caps capsule Commonly known as: FLOMAX Take 1 capsule (0.4 mg total) by mouth daily.        Allergies:  Allergies  Allergen Reactions   Codeine Nausea Only    Other reaction(s): makes him sick to his stomach Other reaction(s): makes him sick to his stomach    Family History: Family History  Problem Relation Age of Onset   Stroke Mother    Diabetes Mellitus II Father    Heart Problems Father    Heart disease Father    Diabetes Sister    Cancer Maternal Grandfather    Stroke Paternal Grandmother    Prostate cancer Neg Hx    Bladder Cancer Neg Hx    Kidney cancer Neg Hx     Social History:  reports that he quit smoking about 21 years ago. His smoking use included cigarettes. He has a 30.00 pack-year smoking history. He has never used smokeless tobacco. He reports that he does not drink alcohol and does not use drugs.   Physical Exam: BP 133/61   Pulse (!) 139   Ht _0  (1.727 m)   Wt 135 lb (61.2 kg)   BMI 20.53 kg/m   Constitutional:  Alert and oriented, No acute distress. HEENT: [place] AT, moist mucus membranes.  Trachea midline, no masses. Cardiovascular: No clubbing, cyanosis, or edema. Respiratory: Normal respiratory effort, no increased work of breathing.   Assessment & Plan:    1. Benign prostatic hyperplasia with lower urinary tract symptoms, symptom details unspecified Stable lower urinary tract symptoms on combination therapy Continue annual follow-up  2.  Nocturia Significant improvement over the last year   Abbie Sons, MD  [facility] [address] [place], [place] [address] [phone]

## 2020-09-01 ENCOUNTER — Encounter: Payer: Self-pay | Admitting: Urology

## 2020-10-17 ENCOUNTER — Other Ambulatory Visit: Payer: Self-pay | Admitting: Unknown Physician Specialty

## 2020-10-17 NOTE — Telephone Encounter (Signed)
Last seen 6.30.2022  no upcoming appt

## 2020-10-17 NOTE — Telephone Encounter (Signed)
Requested medication (s) are due for refill today: Yes  Requested medication (s) are on the active medication list: Yes  Last refill:  08/16/20  Future visit scheduled: No  Notes to clinic:  Unable to refill per protocol due to failed labs, no updated results since 07/2019      Requested Prescriptions  Pending Prescriptions Disp Refills   atorvastatin (LIPITOR) 40 MG tablet [Pharmacy Med Name: Atorvastatin Calcium 40 MG Oral Tablet] 90 tablet 3    Sig: TAKE 1 TABLET BY MOUTH AT  BEDTIME FOR CHOLESTEROL     Cardiovascular:  Antilipid - Statins Failed - 10/17/2020  7:05 AM      Failed - Total Cholesterol in normal range and within 360 days    Cholesterol, Total  Date Value Ref Range Status  07/24/2016 155 100 - 199 mg/dL Final   Cholesterol  Date Value Ref Range Status  06/07/2019 148 <200 mg/dL Final          Failed - LDL in normal range and within 360 days    LDL Cholesterol (Calc)  Date Value Ref Range Status  06/07/2019 71 mg/dL (calc) Final    Comment:    Reference range: <100 . Desirable range <100 mg/dL for primary prevention;   <70 mg/dL for patients with CHD or diabetic patients  with > or = 2 CHD risk factors. Marland Kitchen LDL-C is now calculated using the Martin-Hopkins  calculation, which is a validated novel method providing  better accuracy than the Friedewald equation in the  estimation of LDL-C.  Cresenciano Genre et al. Annamaria Helling. [id number]): 2061-2068  (http://education.[facility].com/faq/FAQ164)           Failed - HDL in normal range and within 360 days    HDL  Date Value Ref Range Status  06/07/2019 47 > OR = 40 mg/dL Final  07/24/2016 62 >39 mg/dL Final          Failed - Triglycerides in normal range and within 360 days    Triglycerides  Date Value Ref Range Status  06/07/2019 210 (H) <150 mg/dL Final    Comment:    . If a non-fasting specimen was collected, consider repeat triglyceride testing on a fasting specimen if clinically indicated.   Yates Decamp et al. J. of Clin. Lipidol. [id number]. Marland Kitchen           Passed - Patient is not pregnant      Passed - Valid encounter within last 12 months    Recent Outpatient Visits           2 months ago Chronic kidney disease, stage 3b [facility])   [facility] Kathrine Haddock, NP   1 year ago Hyperkalemia   [facility] Lebron Conners D, MD   1 year ago Type 2 diabetes mellitus with hyperglycemia, with long-term current use of insulin [facility])   [facility] Lebron Conners D, MD   1 year ago Type 2 diabetes mellitus with hyperglycemia, with long-term current use of insulin [facility])   [place], FNP   2 years ago Mixed hyperlipidemia   [place], Satira Anis, MD       Future Appointments             In 9 months  [place]   In 10 months Stoioff, Ronda Fairly, MD [facility]             irbesartan (AVAPRO) 150 MG  tablet [Pharmacy Med Name: Irbesartan 150 MG Oral Tablet] 90 tablet 3    Sig: TAKE 1 TABLET BY MOUTH  DAILY     Cardiovascular:  Angiotensin Receptor Blockers Failed - 10/17/2020  7:05 AM      Failed - Cr in normal range and within 180 days    Creat  Date Value Ref Range Status  08/17/2019 1.42 (H) 0.70 - 1.18 mg/dL Final    Comment:    For patients >54 years of age, the reference limit for Creatinine is approximately 13% higher for people identified as African-American. .    Creatinine, Urine  Date Value Ref Range Status  07/17/2017 123 20 - 320 mg/dL Final          Failed - K in normal range and within 180 days    Potassium  Date Value Ref Range Status  08/17/2019 5.1 3.5 - 5.3 mmol/L Final          Passed - Patient is not pregnant      Passed - Last BP in normal range    BP Readings from Last 1 Encounters:  08/29/20 133/61          Passed - Valid encounter within last 6  months    Recent Outpatient Visits           2 months ago Chronic kidney disease, stage 3b [facility])   [facility] Kathrine Haddock, NP   1 year ago Hyperkalemia   [facility] Lebron Conners D, MD   1 year ago Type 2 diabetes mellitus with hyperglycemia, with long-term current use of insulin [facility])   [facility] Lebron Conners D, MD   1 year ago Type 2 diabetes mellitus with hyperglycemia, with long-term current use of insulin [facility])   [place], [place], FNP   2 years ago Mixed hyperlipidemia   [place], Satira Anis, MD       Future Appointments             In 9 months  [facility], [place]   In 10 months [place], Ronda Fairly, [place]

## 2020-11-23 ENCOUNTER — Inpatient Hospital Stay: Payer: Medicare Other

## 2020-11-27 ENCOUNTER — Telehealth: Payer: Self-pay | Admitting: Internal Medicine

## 2020-11-27 DIAGNOSIS — R911 Solitary pulmonary nodule: Secondary | ICD-10-CM

## 2020-11-27 NOTE — Telephone Encounter (Signed)
CT has been ordered.  Rodena Piety, please schedule prior to 01/02/2021. Thanks

## 2020-11-27 NOTE — Telephone Encounter (Signed)
Per last OV note, patient is to have CT prior to OV. CT was not ordered.  Lm for patient.

## 2020-11-28 NOTE — Telephone Encounter (Signed)
I have left a message for the patient to call me back to get this CT scheduled

## 2020-12-06 ENCOUNTER — Inpatient Hospital Stay: Payer: Medicare Other

## 2020-12-18 ENCOUNTER — Other Ambulatory Visit: Payer: Self-pay

## 2020-12-18 ENCOUNTER — Ambulatory Visit
Admission: RE | Admit: 2020-12-18 | Discharge: 2020-12-18 | Disposition: A | Discharge status: Home / Self Care | Payer: Medicare Other | Source: Ambulatory Visit | Attending: Internal Medicine | Admitting: Internal Medicine

## 2020-12-18 DIAGNOSIS — R911 Solitary pulmonary nodule: Secondary | ICD-10-CM | POA: Diagnosis not present

## 2020-12-26 ENCOUNTER — Other Ambulatory Visit: Payer: Self-pay | Admitting: Family Medicine

## 2020-12-27 ENCOUNTER — Inpatient Hospital Stay: Payer: Medicare Other | Attending: Oncology

## 2020-12-27 ENCOUNTER — Other Ambulatory Visit: Payer: Self-pay

## 2020-12-27 DIAGNOSIS — D631 Anemia in chronic kidney disease: Secondary | ICD-10-CM | POA: Diagnosis present

## 2020-12-27 DIAGNOSIS — D75839 Thrombocytosis, unspecified: Secondary | ICD-10-CM

## 2020-12-27 DIAGNOSIS — N183 Chronic kidney disease, stage 3 unspecified: Secondary | ICD-10-CM | POA: Insufficient documentation

## 2020-12-27 DIAGNOSIS — D649 Anemia, unspecified: Secondary | ICD-10-CM

## 2020-12-27 LAB — CBC WITH DIFFERENTIAL/PLATELET
Abs Immature Granulocytes: 0.02 10*3/uL (ref 0.00–0.07)
Basophils Absolute: 0.1 10*3/uL (ref 0.0–0.1)
Basophils Relative: 1 %
Eosinophils Absolute: 0.1 10*3/uL (ref 0.0–0.5)
Eosinophils Relative: 1 %
HCT: 35.7 % — ABNORMAL LOW (ref 39.0–52.0)
Hemoglobin: 11.5 g/dL — ABNORMAL LOW (ref 13.0–17.0)
Immature Granulocytes: 0 %
Lymphocytes Relative: 25 %
Lymphs Abs: 1.8 10*3/uL (ref 0.7–4.0)
MCH: 30.8 pg (ref 26.0–34.0)
MCHC: 32.2 g/dL (ref 30.0–36.0)
MCV: 95.7 fL (ref 80.0–100.0)
Monocytes Absolute: 0.6 10*3/uL (ref 0.1–1.0)
Monocytes Relative: 9 %
Neutro Abs: 4.5 10*3/uL (ref 1.7–7.7)
Neutrophils Relative %: 64 %
Platelets: 408 10*3/uL — ABNORMAL HIGH (ref 150–400)
RBC: 3.73 MIL/uL — ABNORMAL LOW (ref 4.22–5.81)
RDW: 12.4 % (ref 11.5–15.5)
WBC: 7.1 10*3/uL (ref 4.0–10.5)
nRBC: 0 % (ref 0.0–0.2)

## 2020-12-27 LAB — IRON AND TIBC
Iron: 75 ug/dL (ref 45–182)
Saturation Ratios: 18 % (ref 17.9–39.5)
TIBC: 410 ug/dL (ref 250–450)
UIBC: 335 ug/dL

## 2020-12-27 LAB — FERRITIN: Ferritin: 25 ng/mL (ref 24–336)

## 2020-12-31 NOTE — Telephone Encounter (Signed)
Pt is scheduled with Serafina Royals FNP for 01/03/21

## 2021-01-01 NOTE — Telephone Encounter (Signed)
He seemed to be ok until his app

## 2021-01-02 ENCOUNTER — Ambulatory Visit: Payer: Medicare Other | Admitting: Internal Medicine

## 2021-01-02 ENCOUNTER — Other Ambulatory Visit: Payer: Self-pay

## 2021-01-02 ENCOUNTER — Encounter: Payer: Self-pay | Admitting: Internal Medicine

## 2021-01-02 VITALS — BP 120/58 | HR 76 | Temp 98.0°F | Ht 68.0 in | Wt 136.2 lb

## 2021-01-02 DIAGNOSIS — R918 Other nonspecific abnormal finding of lung field: Secondary | ICD-10-CM

## 2021-01-02 NOTE — Progress Notes (Signed)
Name: Garrett Yoder. MRN: [id number] DOB: 1945-11-03     CONSULTATION DATE:  01/02/2021 REFERRING MD : Genevive Bi  CHIEF COMPLAINT:  Follow-up abnormal CT chest Right lower lobe groundglass opacity   STUDIES:   11/09/2019 CT chest independently reviewed by me today Right lower lobe groundglass opacification has unchanged over the last several years There is no pleural effusion. There are 2 stable subpleural ground-glass nodules in the right lower lobe, measuring 1.8 x 1.1 cm on image 111/3 and 5 mm on image 108/3. These nodules are unchanged in appearance, without increasing soft tissue components. No new or enlarging pulmonary nodules.      12/14/2017 CT chest Independently reviewed by Me Right lower lobe groundglass opacification 17 x 6 mm  CT chest 2013 independently reviewed by me today Right lower lobe groundglass opacification approximately 6 mm  SYNOPSIS Follow-up abnormal CT chest Patient had slow-growing right lower lobe groundglass opacification over the last 6 years Patient underwent ENB February 2020 Biopsies showed reactive cells no malignancy  seen   Patient is a former smoker quit in 2001 Smoked 1 pack a day for 30 years   HISTORY OF PRESENT ILLNESS: CT chest September 2021 No significant growth the right lower lobe groundglass opacification Findings discussed only to the patient Patient satisfied with findings   CT CHEST 12/2020 No significant growth the right lower lobe groundglass opacification Findings discussed only to the patient  Findings discussed in detail with patient No exacerbation at this time No evidence of heart failure at this time No evidence or signs of infection at this time No respiratory distress No fevers, chills, nausea, vomiting, diarrhea No evidence of lower extremity edema No evidence hemoptysis      Review of Systems:  Gen:  Denies  fever, sweats, chills weight loss  HEENT: Denies blurred vision, double  vision, ear pain, eye pain, hearing loss, nose bleeds, sore throat Cardiac:  No dizziness, chest pain or heaviness, chest tightness,edema, No JVD Resp:   No cough, -sputum production, -shortness of breath,-wheezing, -hemoptysis,  Other:  All other systems negative   BP (!) 120/58 (BP Location: Left Arm, Patient Position: Sitting, Cuff Size: Normal)   Pulse 76   Temp 98 F (36.7 C) (Oral)   Ht 5\' 8"  (1.727 m)   Wt 136 lb 3.2 oz (61.8 kg)   SpO2 98%   BMI 20.71 kg/m    Physical Examination:   General Appearance: No distress  EYES PERRLA, EOM intact.   NECK Supple, No JVD Pulmonary: normal breath sounds, No wheezing.  ALL OTHER ROS ARE NEGATIVE    ASSESSMENT / PLAN:  Abnormal CT chest with groundglass opacity in the right lower lobe findings suggest benign etiology with inflammation Status post ENB did not show any type of malignant cells There have been no significant changes over the last several years Consider annual CT scans  Due to previous smoking history patient is at risk for malignancy and this will need to be followed Recommend follow-up CT chest in 1 year     MEDICATION ADJUSTMENTS/LABS AND TESTS ORDERED: Ct chest in 1 year   CURRENT MEDICATIONS REVIEWED AT [place]   Patient satisfied with Plan of action and management. All questions answered  Follow up in 1 year  TOTAL TIME SPENT 21 mins   Deyja Sochacki Patricia Pesa, M.D.  [facility]  Medical Director [place] Director [facility] Cardio-Pulmonary Department

## 2021-01-02 NOTE — Patient Instructions (Signed)
Follow up CT chest in 1 year

## 2021-01-03 ENCOUNTER — Ambulatory Visit (INDEPENDENT_AMBULATORY_CARE_PROVIDER_SITE_OTHER): Payer: Medicare Other | Admitting: Nurse Practitioner

## 2021-01-03 ENCOUNTER — Encounter: Payer: Self-pay | Admitting: Nurse Practitioner

## 2021-01-03 ENCOUNTER — Other Ambulatory Visit: Payer: Self-pay

## 2021-01-03 VITALS — BP 132/70 | HR 85 | Temp 98.1°F | Resp 16 | Ht 65.0 in | Wt 135.2 lb

## 2021-01-03 DIAGNOSIS — Z794 Long term (current) use of insulin: Secondary | ICD-10-CM

## 2021-01-03 DIAGNOSIS — Z23 Encounter for immunization: Secondary | ICD-10-CM

## 2021-01-03 DIAGNOSIS — R918 Other nonspecific abnormal finding of lung field: Secondary | ICD-10-CM

## 2021-01-03 DIAGNOSIS — N1832 Chronic kidney disease, stage 3b: Secondary | ICD-10-CM | POA: Diagnosis not present

## 2021-01-03 DIAGNOSIS — I10 Essential (primary) hypertension: Secondary | ICD-10-CM | POA: Diagnosis not present

## 2021-01-03 DIAGNOSIS — E1165 Type 2 diabetes mellitus with hyperglycemia: Secondary | ICD-10-CM | POA: Diagnosis not present

## 2021-01-03 DIAGNOSIS — E782 Mixed hyperlipidemia: Secondary | ICD-10-CM

## 2021-01-03 DIAGNOSIS — R351 Nocturia: Secondary | ICD-10-CM

## 2021-01-03 DIAGNOSIS — I700 Atherosclerosis of aorta: Secondary | ICD-10-CM

## 2021-01-03 DIAGNOSIS — D649 Anemia, unspecified: Secondary | ICD-10-CM

## 2021-01-03 DIAGNOSIS — N401 Enlarged prostate with lower urinary tract symptoms: Secondary | ICD-10-CM

## 2021-01-03 MED ORDER — ATORVASTATIN CALCIUM 40 MG PO TABS: 40.0000 mg | ORAL_TABLET | Freq: Every day | ORAL | 0 refills | Status: DC

## 2021-01-03 MED ORDER — IRBESARTAN 150 MG PO TABS: 150.0000 mg | ORAL_TABLET | Freq: Every day | ORAL | 0 refills | Status: DC

## 2021-01-03 NOTE — Progress Notes (Addendum)
BP 132/70   Pulse 85   Temp 98.1 F (36.7 C) (Oral)   Resp 16   Ht 5' 5"  (1.651 m)   Wt 135 lb 3.2 oz (61.3 kg)   SpO2 99%   BMI 22.50 kg/m    Subjective:    Patient ID: Garrett Spikes., male    DOB: 02/25/1945, 75 y.o.   MRN: [id number]  HPI: Garrett Emert. is a 75 y.o. male, here alone  Chief Complaint  Patient presents with   Diabetes   Hyperlipidemia   Hypertension    5 month follow up   HTN: He says his blood pressure has been in the 120s/70s.  He denies any chest pain, shortness of breath, headaches, dizziness, or blurred vision.  He currently takes irbesartan 150 mg daily.    Hyperlipidemia: He is currently taking atorvastatin 40 mg daily.  He denies any myalgia.  His last LDL was 68.   DM: His diabetes is managed by Dr. Gabriel Carina.  Last seen on 08/14/20.  Last A1C was 7.7.  He says his fasting blood sugars have been around 110. He denies any polyuria, polydipsia or polyphagia.  He is currently on levemir, humalog, metform and actos.  Next appointment is 02/20/2021. He says he will get his labs done then.  CKD: His last eGFR was 42 on 08/07/20.  He does not see a nephrologist and he is not interested at this time.  Discussed avoiding NSADs.  He is currently on an ARB  Aortic atherosclerosis: Noted on Ct chest in 2019.  He is currently on a atorvastatin 40 mg daily.  Anemia: He is currently under the treatment of Dr. Janese Banks (oncology).  Last seen on 05/24/2020. Stable  BPH:  Treated by Dr. Bernardo Heater (urology). Last visit was 08/29/20. Stable  Right lower lobe groundglass opacity: Followed by Dr. Mortimer Fries.  Saw him yesterday. History of smoking.  He gets annual ct scans.   Relevant past medical, surgical, family and social history reviewed and updated as indicated. Interim medical history since our last visit reviewed. Allergies and medications reviewed and updated.  Review of Systems  Constitutional: Negative for fever or weight change.  Respiratory: Negative for cough and  shortness of breath.   Cardiovascular: Negative for chest pain or palpitations.  Gastrointestinal: Negative for abdominal pain, no bowel changes.  Musculoskeletal: Negative for gait problem or joint swelling.  Skin: Negative for rash.  Neurological: Negative for dizziness or headache.  No other specific complaints in a complete review of systems (except as listed in HPI above).      Objective:    BP 132/70   Pulse 85   Temp 98.1 F (36.7 C) (Oral)   Resp 16   Ht 5' 5"  (1.651 m)   Wt 135 lb 3.2 oz (61.3 kg)   SpO2 99%   BMI 22.50 kg/m   Wt Readings from Last 3 Encounters:  01/03/21 135 lb 3.2 oz (61.3 kg)  01/02/21 136 lb 3.2 oz (61.8 kg)  08/29/20 135 lb (61.2 kg)    Physical Exam  Constitutional: Patient appears well-developed and well-nourished. No distress.  HEENT: head atraumatic, normocephalic, pupils equal and reactive to light, neck supple Cardiovascular: Normal rate, regular rhythm and normal heart sounds.  No murmur heard. No BLE edema. Pulmonary/Chest: Effort normal and breath sounds normal. No respiratory distress. Abdominal: Soft.  There is no tenderness. Psychiatric: Patient has a normal mood and affect. behavior is normal. Judgment and thought content normal.  Results for orders placed or performed in visit on 12/27/20  Iron and TIBC  Result Value Ref Range   Iron 75 45 - 182 ug/dL   TIBC 410 250 - 450 ug/dL   Saturation Ratios 18 17.9 - 39.5 %   UIBC 335 ug/dL  Ferritin  Result Value Ref Range   Ferritin 25 24 - 336 ng/mL  CBC with Differential/Platelet  Result Value Ref Range   WBC 7.1 4.0 - 10.5 K/uL   RBC 3.73 (L) 4.22 - 5.81 MIL/uL   Hemoglobin 11.5 (L) 13.0 - 17.0 g/dL   HCT 35.7 (L) 39.0 - 52.0 %   MCV 95.7 80.0 - 100.0 fL   MCH 30.8 26.0 - 34.0 pg   MCHC 32.2 30.0 - 36.0 g/dL   RDW 12.4 11.5 - 15.5 %   Platelets 408 (H) 150 - 400 K/uL   nRBC 0.0 0.0 - 0.2 %   Neutrophils Relative % 64 %   Neutro Abs 4.5 1.7 - 7.7 K/uL   Lymphocytes  Relative 25 %   Lymphs Abs 1.8 0.7 - 4.0 K/uL   Monocytes Relative 9 %   Monocytes Absolute 0.6 0.1 - 1.0 K/uL   Eosinophils Relative 1 %   Eosinophils Absolute 0.1 0.0 - 0.5 K/uL   Basophils Relative 1 %   Basophils Absolute 0.1 0.0 - 0.1 K/uL   Immature Granulocytes 0 %   Abs Immature Granulocytes 0.02 0.00 - 0.07 K/uL      Assessment & Plan:   1. Essential hypertension, benign -continue current treatment - irbesartan (AVAPRO) 150 MG tablet; Take 1 tablet (150 mg total) by mouth daily.  Dispense: 90 tablet; Refill: 0 - COMPLETE METABOLIC PANEL WITH GFR 2. Mixed hyperlipidemia -continue current treatment - atorvastatin (LIPITOR) 40 MG tablet; Take 1 tablet (40 mg total) by mouth daily.  Dispense: 90 tablet; Refill: 0 - COMPLETE METABOLIC PANEL WITH GFR - Lipid panel 3. Type 2 diabetes mellitus with hyperglycemia, with long-term current use of insulin ([place]) -continue current treatment -keep appointment with endocrinologist -get labs at your next appointment 4. Chronic kidney disease, stage 3b ([place]) -avoid NSAIDs - COMPLETE METABOLIC PANEL WITH GFR 5. Aortic atherosclerosis ([place]) -continue current treatment  6. Anemia, unspecified type -keep follow-up with oncology  7. Need for influenza vaccination  - Flu Vaccine QUAD High Dose(Fluad)  8. Benign prostatic hyperplasia with nocturia -keep follow-up with urology  9. Abnormal findings on diagnostic imaging of lung -keep follow-ups with pulmonology  Follow up plan: Return in about 6 months (around 07/03/2021) for follow up.

## 2021-01-03 NOTE — Addendum Note (Signed)
Addended by: Serafina Royals F on: 01/03/2021 01:53 PM   Modules accepted: Orders

## 2021-02-27 ENCOUNTER — Other Ambulatory Visit: Payer: Self-pay | Admitting: Nurse Practitioner

## 2021-02-27 DIAGNOSIS — E782 Mixed hyperlipidemia: Secondary | ICD-10-CM

## 2021-02-27 DIAGNOSIS — I10 Essential (primary) hypertension: Secondary | ICD-10-CM

## 2021-02-27 NOTE — Telephone Encounter (Signed)
Requested medications are due for refill today.  yes  Requested medications are on the active medications list.  yes  Last refill. 01/03/2021 for both  Future visit scheduled.   yes  Notes to clinic.  Refills failed protocol d/t expired labs.    Requested Prescriptions  Pending Prescriptions Disp Refills   irbesartan (AVAPRO) 150 MG tablet [Pharmacy Med Name: Irbesartan 150 MG Oral Tablet] 90 tablet 3    Sig: TAKE 1 TABLET BY MOUTH DAILY     Cardiovascular:  Angiotensin Receptor Blockers Failed - 02/27/2021  6:12 AM      Failed - Cr in normal range and within 180 days    Creat  Date Value Ref Range Status  08/17/2019 1.42 (H) 0.70 - 1.18 mg/dL Final    Comment:    For patients >45 years of age, the reference limit for Creatinine is approximately 13% higher for people identified as African-American. .    Creatinine, Urine  Date Value Ref Range Status  07/17/2017 123 20 - 320 mg/dL Final          Failed - K in normal range and within 180 days    Potassium  Date Value Ref Range Status  08/17/2019 5.1 3.5 - 5.3 mmol/L Final          Passed - Patient is not pregnant      Passed - Last BP in normal range    BP Readings from Last 1 Encounters:  01/03/21 132/70          Passed - Valid encounter within last 6 months    Recent Outpatient Visits           1 month ago Essential hypertension, benign   [facility] Serafina Royals F, FNP   6 months ago Chronic kidney disease, stage 3b [facility])   [facility] Kathrine Haddock, NP   1 year ago Hyperkalemia   [facility] Lebron Conners D, MD   1 year ago Type 2 diabetes mellitus with hyperglycemia, with long-term current use of insulin [facility])   [facility] Lebron Conners D, MD   2 years ago Type 2 diabetes mellitus with hyperglycemia, with long-term current use of insulin [facility])   [place], Astrid Divine, [place]        Future Appointments             In 4 months Reece Packer, Myna Hidalgo, [facility], [place]   In 4 months  [facility], [place]   In 6 months Stoioff, Ronda Fairly, MD [facility]             atorvastatin (LIPITOR) 40 MG tablet [Pharmacy Med Name: Atorvastatin Calcium 40 MG Oral Tablet] 90 tablet 3    Sig: TAKE 1 TABLET BY MOUTH DAILY     Cardiovascular:  Antilipid - Statins Failed - 02/27/2021  6:12 AM      Failed - Total Cholesterol in normal range and within 360 days    Cholesterol, Total  Date Value Ref Range Status  07/24/2016 155 100 - 199 mg/dL Final   Cholesterol  Date Value Ref Range Status  06/07/2019 148 <200 mg/dL Final          Failed - LDL in normal range and within 360 days    LDL Cholesterol (Calc)  Date Value Ref Range Status  06/07/2019 71 mg/dL (calc) Final    Comment:    Reference  range: <100 . Desirable range <100 mg/dL for primary prevention;   <70 mg/dL for patients with CHD or diabetic patients  with > or = 2 CHD risk factors. Marland Kitchen LDL-C is now calculated using the Martin-Hopkins  calculation, which is a validated novel method providing  better accuracy than the Friedewald equation in the  estimation of LDL-C.  Cresenciano Genre et al. Annamaria Helling. 3614;431(54): 2061-2068  (http://education.[facility].com/faq/FAQ164)           Failed - HDL in normal range and within 360 days    HDL  Date Value Ref Range Status  06/07/2019 47 > OR = 40 mg/dL Final  07/24/2016 62 >39 mg/dL Final          Failed - Triglycerides in normal range and within 360 days    Triglycerides  Date Value Ref Range Status  06/07/2019 210 (H) <150 mg/dL Final    Comment:    . If a non-fasting specimen was collected, consider repeat triglyceride testing on a fasting specimen if clinically indicated.  Yates Decamp et al. J. of Clin. Lipidol. 0086;7:619-509. Marland Kitchen           Passed - Patient is not pregnant      Passed - Valid  encounter within last 12 months    Recent Outpatient Visits           1 month ago Essential hypertension, benign   [facility] Serafina Royals F, FNP   6 months ago Chronic kidney disease, stage 3b [facility])   [facility] Kathrine Haddock, NP   1 year ago Hyperkalemia   [facility] Lebron Conners D, MD   1 year ago Type 2 diabetes mellitus with hyperglycemia, with long-term current use of insulin [facility])   [facility] Lebron Conners D, MD   2 years ago Type 2 diabetes mellitus with hyperglycemia, with long-term current use of insulin [facility])   [place], [place], FNP       Future Appointments             In 4 months Reece Packer, Myna Hidalgo, [facility], [place]   In 4 months  [facility], [place]   In 6 months [place], Ronda Fairly, MD [facility]

## 2021-03-19 ENCOUNTER — Other Ambulatory Visit: Payer: Self-pay

## 2021-03-19 DIAGNOSIS — Z1211 Encounter for screening for malignant neoplasm of colon: Secondary | ICD-10-CM

## 2021-03-22 ENCOUNTER — Other Ambulatory Visit: Payer: Self-pay | Admitting: Nurse Practitioner

## 2021-03-22 DIAGNOSIS — I10 Essential (primary) hypertension: Secondary | ICD-10-CM

## 2021-03-22 NOTE — Telephone Encounter (Signed)
Requested medications are due for refill today.  yes  Requested medications are on the active medications list.  yes  Last refill. 01/03/2021 #90 /0  Future visit scheduled.   yes  Notes to clinic.  Failed protocol d/t expired labs.    Requested Prescriptions  Pending Prescriptions Disp Refills   irbesartan (AVAPRO) 150 MG tablet [Pharmacy Med Name: Irbesartan 150 MG Oral Tablet] 90 tablet 3    Sig: TAKE 1 TABLET BY MOUTH DAILY     Cardiovascular:  Angiotensin Receptor Blockers Failed - 03/22/2021  4:58 AM      Failed - Cr in normal range and within 180 days    Creat  Date Value Ref Range Status  08/17/2019 1.42 (H) 0.70 - 1.18 mg/dL Final    Comment:    For patients >58 years of age, the reference limit for Creatinine is approximately 13% higher for people identified as African-American. .    Creatinine, Urine  Date Value Ref Range Status  07/17/2017 123 20 - 320 mg/dL Final          Failed - K in normal range and within 180 days    Potassium  Date Value Ref Range Status  08/17/2019 5.1 3.5 - 5.3 mmol/L Final          Passed - Patient is not pregnant      Passed - Last BP in normal range    BP Readings from Last 1 Encounters:  01/03/21 132/70          Passed - Valid encounter within last 6 months    Recent Outpatient Visits           2 months ago Essential hypertension, benign   [facility] Bo Merino, FNP   7 months ago Chronic kidney disease, stage 3b [facility])   [facility] Kathrine Haddock, NP   1 year ago Hyperkalemia   [facility] Lebron Conners D, MD   1 year ago Type 2 diabetes mellitus with hyperglycemia, with long-term current use of insulin [facility])   [facility] Lebron Conners D, MD   2 years ago Type 2 diabetes mellitus with hyperglycemia, with long-term current use of insulin [facility])   [place], Astrid Divine, FNP       Future  Appointments             In 3 months Reece Packer, Myna Hidalgo, [facility], [place]   In 3 months  [facility], [place]   In 5 months [place], Ronda Fairly, MD [facility]

## 2021-03-26 ENCOUNTER — Other Ambulatory Visit: Payer: Self-pay

## 2021-04-01 NOTE — Telephone Encounter (Signed)
PT is calling to state that [facility] did not receive a response on this medication. And has canceled the order. Apt was scheduled for the pt on Wednesday 04/03/21

## 2021-04-03 ENCOUNTER — Encounter: Payer: Self-pay | Admitting: Nurse Practitioner

## 2021-04-03 ENCOUNTER — Other Ambulatory Visit: Payer: Self-pay

## 2021-04-03 ENCOUNTER — Ambulatory Visit (INDEPENDENT_AMBULATORY_CARE_PROVIDER_SITE_OTHER): Payer: Medicare Other | Admitting: Nurse Practitioner

## 2021-04-03 VITALS — BP 128/76 | HR 99 | Temp 98.2°F | Resp 16 | Ht 65.0 in | Wt 138.3 lb

## 2021-04-03 DIAGNOSIS — N401 Enlarged prostate with lower urinary tract symptoms: Secondary | ICD-10-CM

## 2021-04-03 DIAGNOSIS — E782 Mixed hyperlipidemia: Secondary | ICD-10-CM

## 2021-04-03 DIAGNOSIS — N1832 Chronic kidney disease, stage 3b: Secondary | ICD-10-CM | POA: Diagnosis not present

## 2021-04-03 DIAGNOSIS — R351 Nocturia: Secondary | ICD-10-CM

## 2021-04-03 DIAGNOSIS — I10 Essential (primary) hypertension: Secondary | ICD-10-CM | POA: Diagnosis not present

## 2021-04-03 DIAGNOSIS — D649 Anemia, unspecified: Secondary | ICD-10-CM

## 2021-04-03 DIAGNOSIS — Z794 Long term (current) use of insulin: Secondary | ICD-10-CM

## 2021-04-03 DIAGNOSIS — I700 Atherosclerosis of aorta: Secondary | ICD-10-CM

## 2021-04-03 DIAGNOSIS — R911 Solitary pulmonary nodule: Secondary | ICD-10-CM

## 2021-04-03 DIAGNOSIS — E1165 Type 2 diabetes mellitus with hyperglycemia: Secondary | ICD-10-CM

## 2021-04-03 MED ORDER — ATORVASTATIN CALCIUM 40 MG PO TABS: 40.0000 mg | ORAL_TABLET | Freq: Every day | ORAL | 3 refills | Status: DC

## 2021-04-03 MED ORDER — IRBESARTAN 150 MG PO TABS: 150.0000 mg | ORAL_TABLET | Freq: Every day | ORAL | 3 refills | Status: DC

## 2021-04-03 NOTE — Progress Notes (Signed)
There were no vitals taken for this visit.   Subjective:    Patient ID: Garrett Spikes., male    DOB: 1945-08-17, 76 y.o.   MRN: [id number]  HPI: Garrett Sisney. is a 76 y.o. male  No chief complaint on file.   HTN: He is currently taking irbesartan 150 mg daily.  He says his blood pressure has been running 120s/70s. His blood pressure today is 128/76. He denies any shortness of breath, headaches, dizziness, chest pain or blurred vision.     Hyperlipidemia: His last LDL was 68 on 08/07/2020.  He says he is taking his atorvastatin 40 mg daily.  He denies any myalgia.    DM: He last saw Dr. Gabriel Carina who manages his diabetes on 02/20/2021.  His last A1C was 7.6.  He says his fasting blood sugars have been around 110. He denies any polyuria, polydipsia or polyphagia.  He is currently on levemir, humalog, metform and actos.  Changes from last visit with Dr. Gabriel Carina were adjust levemir to 30 units every night, adjust humalog to 6 units before breakfast, 10 units before supper.  He was encouraged to follow a low carb diet, continue ARB and statin and follow up in 6 months.    CKD: He is currently on an ARB irbesartan.  His last GFR was 42 on 08/07/2020. He does not see a nephrologist and he is not interested at this time.  Discussed avoiding NSAIDs.    Aortic atherosclerosis: Noted on Ct chest in 2019.  He is currently on a atorvastatin 40 mg daily. No changes   Anemia: He is currently under the treatment of Dr. Janese Banks (oncology).  Last seen on 05/24/2020. No changes   BPH:  Treated by Dr. Bernardo Heater (urology). Last visit was 08/29/20. No changes   Right lower lobe groundglass opacity: Followed by Dr. Mortimer Fries.  Saw him 01/02/2021. History of smoking.  He gets annual ct scans. No changes  Relevant past medical, surgical, family and social history reviewed and updated as indicated. Interim medical history since our last visit reviewed. Allergies and medications reviewed and updated.  Review of  Systems  Constitutional: Negative for fever or weight change.  Respiratory: Negative for cough and shortness of breath.   Cardiovascular: Negative for chest pain or palpitations.  Gastrointestinal: Negative for abdominal pain, no bowel changes.  Musculoskeletal: Negative for gait problem or joint swelling.  Skin: Negative for rash.  Neurological: Negative for dizziness or headache.  No other specific complaints in a complete review of systems (except as listed in HPI above).      Objective:    There were no vitals taken for this visit.  Wt Readings from Last 3 Encounters:  01/03/21 135 lb 3.2 oz (61.3 kg)  01/02/21 136 lb 3.2 oz (61.8 kg)  08/29/20 135 lb (61.2 kg)    Physical Exam  Constitutional: Patient appears well-developed and well-nourished.  No distress.  HEENT: head atraumatic, normocephalic, pupils equal and reactive to light, neck supple Cardiovascular: Normal rate, regular rhythm and normal heart sounds.  No murmur heard. No BLE edema. Pulmonary/Chest: Effort normal and breath sounds normal. No respiratory distress. Abdominal: Soft.  There is no tenderness. Psychiatric: Patient has a normal mood and affect. behavior is normal. Judgment and thought content normal.   Results for orders placed or performed in visit on 12/27/20  Iron and TIBC  Result Value Ref Range   Iron 75 45 - 182 ug/dL   TIBC 410 250 - 450  ug/dL   Saturation Ratios 18 17.9 - 39.5 %   UIBC 335 ug/dL  Ferritin  Result Value Ref Range   Ferritin 25 24 - 336 ng/mL  CBC with Differential/Platelet  Result Value Ref Range   WBC 7.1 4.0 - 10.5 K/uL   RBC 3.73 (L) 4.22 - 5.81 MIL/uL   Hemoglobin 11.5 (L) 13.0 - 17.0 g/dL   HCT 35.7 (L) 39.0 - 52.0 %   MCV 95.7 80.0 - 100.0 fL   MCH 30.8 26.0 - 34.0 pg   MCHC 32.2 30.0 - 36.0 g/dL   RDW 12.4 11.5 - 15.5 %   Platelets 408 (H) 150 - 400 K/uL   nRBC 0.0 0.0 - 0.2 %   Neutrophils Relative % 64 %   Neutro Abs 4.5 1.7 - 7.7 K/uL   Lymphocytes  Relative 25 %   Lymphs Abs 1.8 0.7 - 4.0 K/uL   Monocytes Relative 9 %   Monocytes Absolute 0.6 0.1 - 1.0 K/uL   Eosinophils Relative 1 %   Eosinophils Absolute 0.1 0.0 - 0.5 K/uL   Basophils Relative 1 %   Basophils Absolute 0.1 0.0 - 0.1 K/uL   Immature Granulocytes 0 %   Abs Immature Granulocytes 0.02 0.00 - 0.07 K/uL      Assessment & Plan:   1. Essential hypertension, benign -continue current treatment - irbesartan (AVAPRO) 150 MG tablet; Take 1 tablet (150 mg total) by mouth daily.  Dispense: 90 tablet; Refill: 3  2. Mixed hyperlipidemia -continue current treatment -will get labs at next appointment - atorvastatin (LIPITOR) 40 MG tablet; Take 1 tablet (40 mg total) by mouth daily.  Dispense: 90 tablet; Refill: 3  3. Type 2 diabetes mellitus with hyperglycemia, with long-term current use of insulin ([place]) -continue current treatment  4. Chronic kidney disease, stage 3b ([place]) -continue ARB -avoid NSAIDs  5. Aortic atherosclerosis (HCC) -continue atorvastatin  6. Anemia, unspecified type -continue follow ups with Dr. Janese Banks  7. Benign prostatic hyperplasia with nocturia -continue follow ups with Dr. Bernardo Heater   8. Incidental lung nodule, > 42mm and < 61mm -continue follow ups with Dr. Mortimer Fries   Follow up plan: Has appointment scheduled in May

## 2021-04-24 ENCOUNTER — Other Ambulatory Visit: Payer: Self-pay | Admitting: Urology

## 2021-04-30 ENCOUNTER — Other Ambulatory Visit: Payer: Self-pay | Admitting: Unknown Physician Specialty

## 2021-04-30 NOTE — Telephone Encounter (Signed)
Requested medication (s) are due for refill today no ? ?Requested medication (s) are on the active medication list -yes ? ?Future visit scheduled -yes ? ?Last refill: 08/16/20 #90 3RF ? ?Notes to clinic: Request RF: too soon, fails lab protocol ? ?Requested Prescriptions  ?Pending Prescriptions Disp Refills  ? tamsulosin (FLOMAX) 0.4 MG CAPS capsule [Pharmacy Med Name: Tamsulosin HCl 0.4 MG Oral Capsule] 90 capsule 3  ?  Sig: TAKE 1 CAPSULE BY MOUTH  DAILY  ?  ? Urology: Alpha-Adrenergic Blocker Failed - 04/30/2021  5:37 AM  ?  ?  Failed - PSA in normal range and within 360 days  ?  Prostate Specific Ag, Serum  ?Date Value Ref Range Status  ?11/14/2016 2.6 0.0 - 4.0 ng/mL Final  ?  Comment:  ?  Roche ECLIA methodology. ?According to the American Urological Association, Serum PSA should ?decrease and remain at undetectable levels after radical ?prostatectomy. The AUA defines biochemical recurrence as an initial ?PSA value 0.2 ng/mL or greater followed by a subsequent confirmatory ?PSA value 0.2 ng/mL or greater. ?Values obtained with different assay methods or kits cannot be used ?interchangeably. Results cannot be interpreted as absolute evidence ?of the presence or absence of malignant disease. ?  ?  ?  ?  ?  Passed - Last BP in normal range  ?  BP Readings from Last 1 Encounters:  ?04/03/21 128/76  ?  ?  ?  ?  Passed - Valid encounter within last 12 months  ?  Recent Outpatient Visits   ? ?      ? 3 weeks ago Essential hypertension, benign  ? [place], FNP  ? 3 months ago Essential hypertension, benign  ? [facility] Bo Merino, FNP  ? 8 months ago Chronic kidney disease, stage 3b ([place])  ? [facility] Kathrine Haddock, NP  ? 1 year ago Hyperkalemia  ? [facility] Lebron Conners D, MD  ? 1 year ago Type 2 diabetes mellitus with hyperglycemia, with long-term current use of insulin ([place])  ? [facility] Towanda Malkin, MD  ? ?  ?  ?Future Appointments   ? ?        ? In 2 months Reece Packer, Myna Hidalgo, [facility], [place]  ? In 2 months  [place]  ? In 4 months Stoioff, Ronda Fairly, MD [place]  ? ?  ? ?  ?  ?  ? ? ? ?Requested Prescriptions  ?Pending Prescriptions Disp Refills  ? tamsulosin (FLOMAX) 0.4 MG CAPS capsule [Pharmacy Med Name: Tamsulosin HCl 0.4 MG Oral Capsule] 90 capsule 3  ?  Sig: TAKE 1 CAPSULE BY MOUTH  DAILY  ?  ? Urology: Alpha-Adrenergic Blocker Failed - 04/30/2021  5:37 AM  ?  ?  Failed - PSA in normal range and within 360 days  ?  Prostate Specific Ag, Serum  ?Date Value Ref Range Status  ?11/14/2016 2.6 0.0 - 4.0 ng/mL Final  ?  Comment:  ?  Roche ECLIA methodology. ?According to the American Urological Association, Serum PSA should ?decrease and remain at undetectable levels after radical ?prostatectomy. The AUA defines biochemical recurrence as an initial ?PSA value 0.2 ng/mL or greater followed by a subsequent confirmatory ?PSA value 0.2 ng/mL or greater. ?Values obtained with different assay methods or kits cannot be used ?interchangeably. Results cannot be interpreted as absolute evidence ?of  the presence or absence of malignant disease. ?  ?  ?  ?  ?  Passed - Last BP in normal range  ?  BP Readings from Last 1 Encounters:  ?04/03/21 128/76  ?  ?  ?  ?  Passed - Valid encounter within last 12 months  ?  Recent Outpatient Visits   ? ?      ? 3 weeks ago Essential hypertension, benign  ? [place], FNP  ? 3 months ago Essential hypertension, benign  ? [facility] Bo Merino, FNP  ? 8 months ago Chronic kidney disease, stage 3b ([place])  ? [facility] Kathrine Haddock, NP  ? 1 year ago Hyperkalemia  ? [facility] Lebron Conners D, MD  ? 1 year ago Type 2 diabetes mellitus with hyperglycemia, with  long-term current use of insulin ([place])  ? [facility] Towanda Malkin, MD  ? ?  ?  ?Future Appointments   ? ?        ? In 2 months Reece Packer, Myna Hidalgo, [facility], [place]  ? In 2 months  [place]  ? In 4 months Stoioff, Ronda Fairly, MD [place]  ? ?  ? ?  ?  ?  ? ? ? ?

## 2021-05-01 ENCOUNTER — Other Ambulatory Visit: Payer: Self-pay

## 2021-05-14 ENCOUNTER — Other Ambulatory Visit: Payer: Self-pay | Admitting: *Deleted

## 2021-05-14 DIAGNOSIS — D649 Anemia, unspecified: Secondary | ICD-10-CM

## 2021-05-24 ENCOUNTER — Inpatient Hospital Stay: Payer: Medicare Other | Admitting: Nurse Practitioner

## 2021-05-24 ENCOUNTER — Inpatient Hospital Stay: Payer: Medicare Other

## 2021-05-27 ENCOUNTER — Inpatient Hospital Stay: Payer: Medicare Other | Attending: Nurse Practitioner

## 2021-05-27 ENCOUNTER — Inpatient Hospital Stay: Payer: Medicare Other | Admitting: Nurse Practitioner

## 2021-05-27 ENCOUNTER — Encounter: Payer: Self-pay | Admitting: Nurse Practitioner

## 2021-05-27 VITALS — BP 125/74 | HR 81 | Temp 98.7°F | Resp 20 | Wt 142.9 lb

## 2021-05-27 DIAGNOSIS — I10 Essential (primary) hypertension: Secondary | ICD-10-CM | POA: Diagnosis not present

## 2021-05-27 DIAGNOSIS — D75839 Thrombocytosis, unspecified: Secondary | ICD-10-CM

## 2021-05-27 DIAGNOSIS — D649 Anemia, unspecified: Secondary | ICD-10-CM

## 2021-05-27 DIAGNOSIS — N189 Chronic kidney disease, unspecified: Secondary | ICD-10-CM | POA: Diagnosis not present

## 2021-05-27 DIAGNOSIS — E119 Type 2 diabetes mellitus without complications: Secondary | ICD-10-CM | POA: Diagnosis not present

## 2021-05-27 LAB — IRON AND TIBC
Iron: 69 ug/dL (ref 45–182)
Saturation Ratios: 17 % — ABNORMAL LOW (ref 17.9–39.5)
TIBC: 412 ug/dL (ref 250–450)
UIBC: 343 ug/dL

## 2021-05-27 LAB — CBC WITH DIFFERENTIAL/PLATELET
Abs Immature Granulocytes: 0.05 10*3/uL (ref 0.00–0.07)
Basophils Absolute: 0.1 10*3/uL (ref 0.0–0.1)
Basophils Relative: 1 %
Eosinophils Absolute: 0.1 10*3/uL (ref 0.0–0.5)
Eosinophils Relative: 1 %
HCT: 36.7 % — ABNORMAL LOW (ref 39.0–52.0)
Hemoglobin: 11.8 g/dL — ABNORMAL LOW (ref 13.0–17.0)
Immature Granulocytes: 1 %
Lymphocytes Relative: 22 %
Lymphs Abs: 1.7 10*3/uL (ref 0.7–4.0)
MCH: 30.8 pg (ref 26.0–34.0)
MCHC: 32.2 g/dL (ref 30.0–36.0)
MCV: 95.8 fL (ref 80.0–100.0)
Monocytes Absolute: 0.7 10*3/uL (ref 0.1–1.0)
Monocytes Relative: 9 %
Neutro Abs: 5.1 10*3/uL (ref 1.7–7.7)
Neutrophils Relative %: 66 %
Platelets: 469 10*3/uL — ABNORMAL HIGH (ref 150–400)
RBC: 3.83 MIL/uL — ABNORMAL LOW (ref 4.22–5.81)
RDW: 13.4 % (ref 11.5–15.5)
WBC: 7.7 10*3/uL (ref 4.0–10.5)
nRBC: 0 % (ref 0.0–0.2)

## 2021-05-27 LAB — FERRITIN: Ferritin: 16 ng/mL — ABNORMAL LOW (ref 24–336)

## 2021-05-27 NOTE — Progress Notes (Signed)
?Hematology/Oncology Consult Note ?[place]  ?[phone] [phone] ? ?Patient Care Team: ?Bo Merino, FNP as PCP - General (Nurse Practitioner) ?Judi Cong, MD as Physician Assistant (Endocrinology) ?Sindy Guadeloupe, MD as Consulting Physician (Oncology) ?Stoioff, Ronda Fairly, MD (Urology)  ? ?Name of the patient: Garrett Yoder  ?[id number]  ?08-06-1945  ? ?Date of visit: 05/27/21 ? ?Diagnosis- 1. 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 Anemia likely due to chronic disease ? ?Chief complaint/ Reason for visit-routine follow-up of anemia and thrombocytosis ? ?Heme/Onc history: patient is a 76 year old Caucasian male with a past medical history significant for diabetes, GERD, hypertension hyperlipidemia anxiety among other medical problems he has been referred to [place] for thrombocytosis. Of note patient has always had thrombocytosis even dating back to 2013 when his platelet count was 484. Since then his platelet count had been ranging between 400's to 500s. In July 2018 his platelet count was 522 and then gradually rose to 566 seconds 728 and then 748 recently. White count has always been normal. He is also had a long-standing normocytic anemia and his hemoglobin ranges around 11. Most recent CBC from 11/14/2016 showed white count of 6.5, H&H of 10.2/31.7 with an MCV of 95 and a platelet count of 748. Iron studies show normal ferritin of 112 and iron saturation of 23% and TIBC was normal at 300 ?  ?Patient has had a 12 pound weight loss over the last 3 months but states that most of it happened over last 2 months after his disk surgery 8 weeks ago. He states that his appetite is coming back and he is feeling better. He has seen Dr. Ma Hillock from our practice in the past for iron deficiency anemia ?  ?Results of blood work from 11/25/2016 were as follows: CBC showed white count of 5.6, H&H of 11/32.8 and a platelet count of 542. B12 was normal at 257 and folate was normal.  Haptoglobin levels were normal. Reticulocyte count was mildly low at 0.9%. Jak 2 And MPL Were Negative. ESR Was Normal. Peripheral Smear Review Showed Normal Morphology of WBCs RBCs and Platelets. CMP Was within Normal Limits. BCR Abl Testing Was Negative. Multiple Myeloma Panel Revealed No M Protein with Immunofixation Revealed Free Lambda Light Chains ?  ?Bone marrow biopsy on 03/17/2017 showed normocellular bone marrow with trilineage hematopoiesis.  Numerous lymphoid aggregates and normocytic normochromic anemia.  Bone marrow did not show any morphologic features of myeloproliferative or myelodysplastic neoplasm.  He did have numerous lymphoid aggregates.  Flow cytometry analysis showed a minor lymphoid population of B cells (22%) which were less than 5% of all cells and were coexpressing CD5 and CD20.  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  Cytogenetics and FISH studies were normal.  Plasma cells represented 2% of the cells and showed polyclonal staining pattern for kappa and lambda light chains and no evidence of plasma cell neoplasm in the bone marrow ?  ?Interval history- Patient is 76 y.o.male with above history of chronic anemia who returns to clinic for follow-up. He continues to feel at baseline. Fatigue is stable. Appetite and weight have improved.  ? ?ECOG PS- 1 ?Pain scale- 0 ? ?Review of systems- Review of Systems  ?Constitutional:  Negative for chills, fever, malaise/fatigue and weight loss.  ?HENT:  Negative for congestion, ear discharge and nosebleeds.   ?Eyes:  Negative for blurred vision.  ?Respiratory:  Negative for cough, hemoptysis, sputum production, shortness of breath and wheezing.   ?Cardiovascular:  Negative  for chest pain, palpitations, orthopnea and claudication.  ?Gastrointestinal:  Negative for abdominal pain, blood in stool, constipation, diarrhea, heartburn, melena, nausea and vomiting.  ?Genitourinary:  Negative for dysuria, flank pain,  frequency, hematuria and urgency.  ?Musculoskeletal:  Negative for back pain, joint pain and myalgias.  ?Skin:  Negative for rash.  ?Neurological:  Negative for dizziness, tingling, focal weakness, seizures, weakness and headaches.  ?Endo/Heme/Allergies:  Does not bruise/bleed easily.  ?Psychiatric/Behavioral:  Negative for depression and suicidal ideas. The patient does not have insomnia.    ? ? ?Allergies  ?Allergen Reactions  ? Codeine Nausea Only  ?  Other reaction(s): makes him sick to his stomach ?Other reaction(s): makes him sick to his stomach  ? ? ?Past Medical History:  ?Diagnosis Date  ? Anemia 08/14/2015  ? Anxiety   ? Aortic atherosclerosis ([place]) 11/25/2016  ? Chest CT Oct 2018  ? Aorto-iliac atherosclerosis ([place]) 11/25/2016  ? Abd/pelvic CT Oct 2018  ? Coronary artery calcification seen on CAT scan 11/25/2016  ? Chest CT Oct 2018; refer to Dr. Fletcher Anon  ? DDD (degenerative disc disease), cervical 08/21/2016  ? Depression   ? Diabetes mellitus without complication ([place])   ? Facet arthropathy, cervical 08/21/2016  ? GERD (gastroesophageal reflux disease)   ? Heart murmur   ? As child  ? Hyperlipidemia   ? Hypertension   ? Hypertrophy of prostate with urinary obstruction 11/25/2016  ? Pelvic CT October 2018  ? Incidental lung nodule, > 78m and < 839m10/10/2016  ? 5 mm nodule LLL, chest CT Oct 2018  ? Thrombocytosis 08/14/2015  ? ? ?Past Surgical History:  ?Procedure Laterality Date  ? ANTERIOR CERVICAL DECOMP/DISCECTOMY FUSION N/A 09/24/2016  ? Procedure: ANTERIOR CERVICAL DECOMPRESSION/DISCECTOMY FUSION 3 LEVELS-C4-7;  Surgeon: YaMeade MawMD;  Location: [facility] ORS;  Service: Neurosurgery;  Laterality: N/A;  ? CATARACT EXTRACTION    ? ELECTROMAGNETIC NAVIGATION BROCHOSCOPY Right 04/07/2018  ? Procedure: ELECTROMAGNETIC NAVIGATION BRONCHOSCOPY;  Surgeon: KaFlora LippsMD;  Location: [facility] ORS;  Service: Cardiopulmonary;  Laterality: Right;  ? EYE SURGERY Right   ? Cataract Extraction with IOL  ? RETINAL LASER  PROCEDURE    ? ? ?Social History  ? ?Socioeconomic History  ? Marital status: Widowed  ?  Spouse name: Not on file  ? Number of children: Not on file  ? Years of education: Not on file  ? Highest education level: Not on file  ?Occupational History  ? Occupation: retired  ?Tobacco Use  ? Smoking status: Former  ?  Packs/day: 1.00  ?  Years: 30.00  ?  Pack years: 30.00  ?  Types: Cigarettes  ?  Quit date: 07/19/1999  ?  Years since quitting: 21.8  ? Smokeless tobacco: Never  ?Vaping Use  ? Vaping Use: Never used  ?Substance and Sexual Activity  ? Alcohol use: No  ?  Alcohol/week: 0.0 standard drinks  ? Drug use: No  ? Sexual activity: Yes  ?  Partners: Female  ?Other Topics Concern  ? Not on file  ?Social History Narrative  ? Not on file  ? ?Social Determinants of Health  ? ?Financial Resource Strain: Low Risk   ? Difficulty of Paying Living Expenses: Not hard at all  ?Food Insecurity: No Food Insecurity  ? Worried About RuCharity fundraisern the Last Year: Never true  ? Ran Out of Food in the Last Year: Never true  ?Transportation Needs: No Transportation Needs  ? Lack of Transportation (Medical): No  ?  Lack of Transportation (Non-Medical): No  ?Physical Activity: Sufficiently Active  ? Days of Exercise per Week: 7 days  ? Minutes of Exercise per Session: 30 min  ?Stress: Stress Concern Present  ? Feeling of Stress : To some extent  ?Social Connections: Moderately Isolated  ? Frequency of Communication with Friends and Family: More than three times a week  ? Frequency of Social Gatherings with Friends and Family: Twice a week  ? Attends Religious Services: More than 4 times per year  ? Active Member of Clubs or Organizations: No  ? Attends Archivist Meetings: Never  ? Marital Status: Widowed  ?Intimate Partner Violence: Not At Risk  ? Fear of Current or Ex-Partner: No  ? Emotionally Abused: No  ? Physically Abused: No  ? Sexually Abused: No  ? ? ?Family History  ?Problem Relation Age of Onset  ? Stroke  Mother   ? Diabetes Mellitus II Father   ? Heart Problems Father   ? Heart disease Father   ? Diabetes Sister   ? Cancer Maternal Grandfather   ? Stroke Paternal Grandmother   ? Prostate cancer Neg Hx   ? Bladder Cancer Neg Hx

## 2021-05-28 ENCOUNTER — Telehealth: Payer: Self-pay | Admitting: Emergency Medicine

## 2021-05-28 NOTE — Telephone Encounter (Signed)
Spoke to [place] at [place] is scheduled for Colonoscopy consultation on April 18.  ?

## 2021-07-02 ENCOUNTER — Encounter: Payer: Self-pay | Admitting: Nurse Practitioner

## 2021-07-02 ENCOUNTER — Other Ambulatory Visit: Payer: Self-pay

## 2021-07-02 ENCOUNTER — Ambulatory Visit (INDEPENDENT_AMBULATORY_CARE_PROVIDER_SITE_OTHER): Payer: Medicare Other | Admitting: Nurse Practitioner

## 2021-07-02 VITALS — BP 124/78 | HR 87 | Temp 98.1°F | Resp 16 | Ht 65.0 in | Wt 143.7 lb

## 2021-07-02 DIAGNOSIS — I10 Essential (primary) hypertension: Secondary | ICD-10-CM

## 2021-07-02 DIAGNOSIS — Z794 Long term (current) use of insulin: Secondary | ICD-10-CM

## 2021-07-02 DIAGNOSIS — E1165 Type 2 diabetes mellitus with hyperglycemia: Secondary | ICD-10-CM

## 2021-07-02 DIAGNOSIS — E782 Mixed hyperlipidemia: Secondary | ICD-10-CM

## 2021-07-02 DIAGNOSIS — D649 Anemia, unspecified: Secondary | ICD-10-CM

## 2021-07-02 DIAGNOSIS — I700 Atherosclerosis of aorta: Secondary | ICD-10-CM

## 2021-07-02 DIAGNOSIS — N1832 Chronic kidney disease, stage 3b: Secondary | ICD-10-CM | POA: Diagnosis not present

## 2021-07-02 NOTE — Assessment & Plan Note (Signed)
Last A1c was 7.6 on February 20, 2021 getting labs today.  Sees Dr. Gabriel Carina in June.  He is currently taking an ARB and a statin.  He has been taking Levemir 30 units every night Humalog 6 units before breakfast and 10 units before supper.  He is also taking 15 mg of Actos and metformin. ?

## 2021-07-02 NOTE — Progress Notes (Signed)
? ?BP 124/78   Pulse 87   Temp 98.1 ?F (36.7 ?C) (Oral)   Resp 16   Ht '5\' 5"'$  (1.651 m)   Wt 143 lb 11.2 oz (65.2 kg)   SpO2 93%   BMI 23.91 kg/m?   ? ?Subjective:  ? ? Patient ID: Garrett Spikes., male    DOB: November 26, 1945, 76 y.o.   MRN: [id number] ? ?HPI: ?Garrett Nguyen. is a 76 y.o. male ? ?Chief Complaint  ?Patient presents with  ? Diabetes  ? Hypertension  ? Hyperlipidemia  ?  6 month follow up  ? ?Type 2 diabetes: Last A1c was 7.6, on February 20, 2021.  He sees Dr. Gabriel Carina manages his diabetes.  He says his fasting blood sugars have been running around 107-123.  Denies any polyuria, polyphagia or polydipsia.  He is currently on Levemir, Humalog, metformin and Actos.  Last visit with Dr. Gabriel Carina was on February 20, 2021.  He has been taking Levemir 30 units every night, adjust Humalog to 6 units before breakfast and 10 units before supper.  He has been following a low-carb diet and continued ARB and statin supposed to follow-up with Dr. Gabriel Carina in June. ? ?Hyperlipidemia: His last LDL was 68 on 08/07/2020.  He says he is taking his atorvastatin 40 mg daily.  He denies any myalgia.  We will get labs today. ? ?Hypertension: He is currently taking irbesartan 150 mg daily.  Says his blood pressures have been running about 116/69-109/72.  His blood pressure today is 124/78.  He denies having any chest pain, shortness of breath, headaches or blurred vision.  We will continue with current treatment. ? ?Chronic kidney disease: He is on an ARB irbesartan.  Last GFR was 42 on 08/07/2020.  He does not see a nephrologist and he is not interested at this time.  He is aware that he needs to avoid NSAIDs. ? ?Aortic atherosclerosis: It was noted on his CT chest in 2019.  He does take atorvastatin 40 mg daily.  He denies any myalgia. ? ?Anemia: He is currently seeing Dr. Janese Yoder from hematology/ oncology.  Last CBC was done 05/27/2021 hemoglobin was 11.8 hematocrit 36.,  Iron was 69, TIBC was 412, saturation ratio was 17, U IBC  343.  Last saw hematology on 05/27/2021.  No changes in treatment plan. ? ?Relevant past medical, surgical, family and social history reviewed and updated as indicated. Interim medical history since our last visit reviewed. ?Allergies and medications reviewed and updated. ? ?Review of Systems ? ?Constitutional: Negative for fever or weight change.  ?Respiratory: Negative for cough and shortness of breath.   ?Cardiovascular: Negative for chest pain or palpitations.  ?Gastrointestinal: Negative for abdominal pain, no bowel changes.  ?Musculoskeletal: Negative for gait problem or joint swelling.  ?Skin: Negative for rash.  ?Neurological: Negative for dizziness or headache.  ?No other specific complaints in a complete review of systems (except as listed in HPI above).  ? ?   ?Objective:  ?  ?BP 124/78   Pulse 87   Temp 98.1 ?F (36.7 ?C) (Oral)   Resp 16   Ht '5\' 5"'$  (1.651 m)   Wt 143 lb 11.2 oz (65.2 kg)   SpO2 93%   BMI 23.91 kg/m?   ?Wt Readings from Last 3 Encounters:  ?07/02/21 143 lb 11.2 oz (65.2 kg)  ?05/27/21 142 lb 14.4 oz (64.8 kg)  ?04/03/21 138 lb 4.8 oz (62.7 kg)  ?  ?Physical Exam ? ?Constitutional: Patient appears  well-developed and well-nourished.  No distress.  ?HEENT: head atraumatic, normocephalic, pupils equal and reactive to light,  neck supple ?Cardiovascular: Normal rate, regular rhythm and normal heart sounds.  No murmur heard. No BLE edema. ?Pulmonary/Chest: Effort normal and breath sounds normal. No respiratory distress. ?Abdominal: Soft.  There is no tenderness. ?Psychiatric: Patient has a normal mood and affect. behavior is normal. Judgment and thought content normal.  ?Diabetic Foot Exam - Simple   ?Simple Foot Form ?Diabetic Foot exam was performed with the following findings: Yes 07/02/2021  1:36 PM  ?Visual Inspection ?No deformities, no ulcerations, no other skin breakdown bilaterally: Yes ?Sensation Testing ?Intact to touch and monofilament testing bilaterally: Yes ?Pulse  Check ?Posterior Tibialis and Dorsalis pulse intact bilaterally: Yes ?Comments ?  ?  ?Results for orders placed or performed in visit on 05/27/21  ?Ferritin  ?Result Value Ref Range  ? Ferritin 16 (L) 24 - 336 ng/mL  ?CBC with Differential  ?Result Value Ref Range  ? WBC 7.7 4.0 - 10.5 K/uL  ? RBC 3.83 (L) 4.22 - 5.81 MIL/uL  ? Hemoglobin 11.8 (L) 13.0 - 17.0 g/dL  ? HCT 36.7 (L) 39.0 - 52.0 %  ? MCV 95.8 80.0 - 100.0 fL  ? MCH 30.8 26.0 - 34.0 pg  ? MCHC 32.2 30.0 - 36.0 g/dL  ? RDW 13.4 11.5 - 15.5 %  ? Platelets 469 (H) 150 - 400 K/uL  ? nRBC 0.0 0.0 - 0.2 %  ? Neutrophils Relative % 66 %  ? Neutro Abs 5.1 1.7 - 7.7 K/uL  ? Lymphocytes Relative 22 %  ? Lymphs Abs 1.7 0.7 - 4.0 K/uL  ? Monocytes Relative 9 %  ? Monocytes Absolute 0.7 0.1 - 1.0 K/uL  ? Eosinophils Relative 1 %  ? Eosinophils Absolute 0.1 0.0 - 0.5 K/uL  ? Basophils Relative 1 %  ? Basophils Absolute 0.1 0.0 - 0.1 K/uL  ? Immature Granulocytes 1 %  ? Abs Immature Granulocytes 0.05 0.00 - 0.07 K/uL  ?Iron and TIBC([facility])  ?Result Value Ref Range  ? Iron 69 45 - 182 ug/dL  ? TIBC 412 250 - 450 ug/dL  ? Saturation Ratios 17 (L) 17.9 - 39.5 %  ? UIBC 343 ug/dL  ? ?   ?Assessment & Plan:  ? ?Problem List Items Addressed This Visit   ? ?  ? Cardiovascular and Mediastinum  ? Essential hypertension, benign  ?  Blood pressure at goal.  Continue taking irbesartan 150 mg daily.  ? ?  ?  ? Relevant Orders  ? CBC with Differential/Platelet  ? COMPLETE METABOLIC PANEL WITH GFR  ? Aortic atherosclerosis ([place])  ?  Seen on CT chest in 2019.  Continue taking atorvastatin 40 mg daily. ? ?  ?  ? Relevant Orders  ? Lipid panel  ?  ? Endocrine  ? Type 2 diabetes mellitus with hyperglycemia, with long-term current use of insulin (HCC) - Primary  ?  Last A1c was 7.6 on February 20, 2021 getting labs today.  Sees Dr. Gabriel Carina in June.  He is currently taking an ARB and a statin.  He has been taking Levemir 30 units every night Humalog 6 units before breakfast and 10  units before supper.  He is also taking 15 mg of Actos and metformin. ? ?  ?  ? Relevant Orders  ? HM Diabetes Foot Exam (Completed)  ? COMPLETE METABOLIC PANEL WITH GFR  ? Hemoglobin A1c  ?  ? Genitourinary  ? Chronic  kidney disease, stage 3b ([place])  ?  Last GFR was 42 on 08/07/2020.  We will get labs today.  Patient has not seen a nephrologist and is not interested in one right now.  He is aware to avoid NSAIDs. ? ?  ?  ? Relevant Orders  ? COMPLETE METABOLIC PANEL WITH GFR  ?  ? Other  ? Hyperlipidemia  ? Relevant Orders  ? Lipid panel  ? COMPLETE METABOLIC PANEL WITH GFR  ? Anemia  ?  He is currently under the care of of heme-onc, Dr. Janese Yoder.  He last saw hematology on 05/27/2021.  No changes in treatment plan. ? ?  ?  ? Relevant Orders  ? CBC with Differential/Platelet  ?  ? ?Follow up plan: ?Return in about 6 months (around 01/02/2022) for follow up. ? ? ? ? ? ?

## 2021-07-02 NOTE — Assessment & Plan Note (Signed)
Blood pressure at goal.  Continue taking irbesartan 150 mg daily.  ?

## 2021-07-02 NOTE — Assessment & Plan Note (Signed)
Seen on CT chest in 2019.  Continue taking atorvastatin 40 mg daily. ?

## 2021-07-02 NOTE — Assessment & Plan Note (Signed)
He is currently under the care of of heme-onc, Dr. Janese Banks.  He last saw hematology on 05/27/2021.  No changes in treatment plan. ?

## 2021-07-02 NOTE — Assessment & Plan Note (Signed)
Last GFR was 42 on 08/07/2020.  We will get labs today.  Patient has not seen a nephrologist and is not interested in one right now.  He is aware to avoid NSAIDs. ?

## 2021-07-03 LAB — CBC WITH DIFFERENTIAL/PLATELET
Absolute Monocytes: 618 cells/uL (ref 200–950)
Basophils Absolute: 26 cells/uL (ref 0–200)
Basophils Relative: 0.3 %
Eosinophils Absolute: 26 cells/uL (ref 15–500)
Eosinophils Relative: 0.3 %
HCT: 36.3 % — ABNORMAL LOW (ref 38.5–50.0)
Hemoglobin: 12 g/dL — ABNORMAL LOW (ref 13.2–17.1)
Lymphs Abs: 1314 cells/uL (ref 850–3900)
MCH: 31.1 pg (ref 27.0–33.0)
MCHC: 33.1 g/dL (ref 32.0–36.0)
MCV: 94 fL (ref 80.0–100.0)
MPV: 8.7 fL (ref 7.5–12.5)
Monocytes Relative: 7.1 %
Neutro Abs: 6716 cells/uL (ref 1500–7800)
Neutrophils Relative %: 77.2 %
Platelets: 452 10*3/uL — ABNORMAL HIGH (ref 140–400)
RBC: 3.86 10*6/uL — ABNORMAL LOW (ref 4.20–5.80)
RDW: 12.6 % (ref 11.0–15.0)
Total Lymphocyte: 15.1 %
WBC: 8.7 10*3/uL (ref 3.8–10.8)

## 2021-07-03 LAB — COMPLETE METABOLIC PANEL WITH GFR
AG Ratio: 1.9 (calc) (ref 1.0–2.5)
ALT: 24 U/L (ref 9–46)
AST: 18 U/L (ref 10–35)
Albumin: 4.5 g/dL (ref 3.6–5.1)
Alkaline phosphatase (APISO): 95 U/L (ref 35–144)
BUN/Creatinine Ratio: 15 (calc) (ref 6–22)
BUN: 25 mg/dL (ref 7–25)
CO2: 27 mmol/L (ref 20–32)
Calcium: 9.5 mg/dL (ref 8.6–10.3)
Chloride: 102 mmol/L (ref 98–110)
Creat: 1.66 mg/dL — ABNORMAL HIGH (ref 0.70–1.28)
Globulin: 2.4 g/dL (calc) (ref 1.9–3.7)
Glucose, Bld: 179 mg/dL — ABNORMAL HIGH (ref 65–99)
Potassium: 4.8 mmol/L (ref 3.5–5.3)
Sodium: 139 mmol/L (ref 135–146)
Total Bilirubin: 0.4 mg/dL (ref 0.2–1.2)
Total Protein: 6.9 g/dL (ref 6.1–8.1)
eGFR: 43 mL/min/{1.73_m2} — ABNORMAL LOW (ref >=60)

## 2021-07-03 LAB — LIPID PANEL
Cholesterol: 138 mg/dL (ref <200)
HDL: 60 mg/dL (ref >=40)
LDL Cholesterol (Calc): 57 mg/dL (calc)
Non-HDL Cholesterol (Calc): 78 mg/dL (calc) (ref <130)
Total CHOL/HDL Ratio: 2.3 (calc) (ref <5.0)
Triglycerides: 127 mg/dL (ref <150)

## 2021-07-03 LAB — HEMOGLOBIN A1C
Hgb A1c MFr Bld: 7.1 % of total Hgb — ABNORMAL HIGH (ref <5.7)
Mean Plasma Glucose: 157 mg/dL
eAG (mmol/L): 8.7 mmol/L

## 2021-07-16 ENCOUNTER — Ambulatory Visit: Payer: Medicare Other

## 2021-07-30 ENCOUNTER — Ambulatory Visit: Payer: Medicare Other

## 2021-08-29 ENCOUNTER — Ambulatory Visit: Payer: Self-pay | Admitting: Urology

## 2021-09-24 ENCOUNTER — Ambulatory Visit (INDEPENDENT_AMBULATORY_CARE_PROVIDER_SITE_OTHER): Payer: Medicare Other

## 2021-09-24 ENCOUNTER — Other Ambulatory Visit: Payer: Self-pay

## 2021-09-24 VITALS — Ht 65.0 in | Wt 142.0 lb

## 2021-09-24 DIAGNOSIS — Z1211 Encounter for screening for malignant neoplasm of colon: Secondary | ICD-10-CM

## 2021-09-24 DIAGNOSIS — Z0000 Encounter for general adult medical examination without abnormal findings: Secondary | ICD-10-CM | POA: Diagnosis not present

## 2021-09-24 NOTE — Progress Notes (Unsigned)
I connected with  [name] on 09/24/21 by a audio enabled telemedicine application and verified that I am speaking with the correct person using two identifiers.  Patient Location: Home  Provider Location: Office/Clinic  I discussed the limitations of evaluation and management by telemedicine. The patient expressed understanding and agreed to proceed.   Subjective    Garrett Yoder. is a 76 y.o. male who presents for Medicare Annual/Subsequent preventive examination.  Review of Systems    Per HPI unless specifically indicated below Cardiac Risk Factors include: advanced age (>29mn, >>37women);male gender          Objective:        09/24/2021    8:36 AM 07/02/2021   12:59 PM 05/27/2021    2:17 PM  Vitals with BMI  Height '5\' 5"'$  '5\' 5"'$    Weight 142 lbs 143 lbs 11 oz 142 lbs 14 oz  BMI 238.18229.93  Systolic  [id number] Diastolic  78 74  Pulse  87 81    Today's Vitals   09/24/21 0836  Weight: 142 lb (64.4 kg)  Height: '5\' 5"'$  (1.651 m)   Body mass index is 23.63 kg/m.     05/27/2021    2:20 PM 07/12/2020    4:12 PM 05/24/2020    1:50 PM 03/31/2019   11:28 AM 04/07/2018    9:07 AM 03/29/2018   10:27 AM 12/06/2017   10:00 PM  Advanced Directives  Does Patient Have a Medical Advance Directive? No No No No No No No  Would patient like information on creating a medical advance directive? No - Patient declined No - Patient declined Yes (MAU/Ambulatory/Procedural Areas - Information given) Yes (MAU/Ambulatory/Procedural Areas - Information given) No - Patient declined No - Patient declined No - Patient declined    Current Medications (verified) Outpatient Encounter Medications as of 09/24/2021  Medication Sig   acetaminophen (TYLENOL) 500 MG tablet Take 1,000 mg by mouth 2 (two) times daily as needed.    aspirin EC 81 MG tablet Take 81 mg by mouth daily.   atorvastatin (LIPITOR) 40 MG tablet Take 1 tablet (40 mg total) by mouth daily.   bimatoprost (LUMIGAN) 0.01  % SOLN Place 1 drop into both eyes at bedtime.    finasteride (PROSCAR) 5 MG tablet TAKE 1 TABLET BY MOUTH  DAILY   insulin detemir (LEVEMIR FLEXTOUCH) 100 UNIT/ML FlexPen Inject 26 Units into the skin every evening.   insulin lispro (HUMALOG) 100 UNIT/ML KwikPen Inject 6 Units into the skin daily. Pt taking 6 units in AM, 10 units at noon & 20 units at supper   irbesartan (AVAPRO) 150 MG tablet Take 1 tablet (150 mg total) by mouth daily.   metFORMIN (GLUCOPHAGE) 1000 MG tablet Take by mouth.   Multiple Vitamin (MULTI-VITAMINS) TABS Take 1 tablet by mouth 2 (two) times daily.    pioglitazone (ACTOS) 15 MG tablet Take 15 mg by mouth daily.   SIMBRINZA 1-0.2 % SUSP Place 1 drop into both eyes 3 (three) times daily.    tamsulosin (FLOMAX) 0.4 MG CAPS capsule TAKE 1 CAPSULE BY MOUTH  DAILY   No facility-administered encounter medications on file as of 09/24/2021.    Allergies (verified) Codeine   History: Past Medical History:  Diagnosis Date   Anemia 08/14/2015   Anxiety    Aortic atherosclerosis ([place] 11/25/2016   Chest CT Oct 2018   Aorto-iliac atherosclerosis ([facility] 11/25/2016   Abd/pelvic CT Oct 2018  Coronary artery calcification seen on CAT scan 11/25/2016   Chest CT Oct 2018; refer to Dr. Fletcher Anon   DDD (degenerative disc disease), cervical 08/21/2016   Depression    Diabetes mellitus without complication ([place])    Facet arthropathy, cervical 08/21/2016   GERD (gastroesophageal reflux disease)    Heart murmur    As child   Hyperlipidemia    Hypertension    Hypertrophy of prostate with urinary obstruction 11/25/2016   Pelvic CT October 2018   Incidental lung nodule, > 34m and < 825m10/10/2016   5 mm nodule LLL, chest CT Oct 2018   Thrombocytosis 08/14/2015   Past Surgical History:  Procedure Laterality Date   ANTERIOR CERVICAL DECOMP/DISCECTOMY FUSION N/A 09/24/2016   Procedure: ANTERIOR CERVICAL DECOMPRESSION/DISCECTOMY FUSION 3 LEVELS-C4-7;  Surgeon: YaMeade MawMD;   Location: [facility] ORS;  Service: Neurosurgery;  Laterality: N/A;   CATARACT EXTRACTION     ELECTROMAGNETIC NAVIGATION BROCHOSCOPY Right 04/07/2018   Procedure: ELECTROMAGNETIC NAVIGATION BRONCHOSCOPY;  Surgeon: KaFlora LippsMD;  Location: [facility] ORS;  Service: Cardiopulmonary;  Laterality: Right;   EYE SURGERY Right    Cataract Extraction with IOL   RETINAL LASER PROCEDURE     Family History  Problem Relation Age of Onset   Stroke Mother    Diabetes Mellitus II Father    Heart Problems Father    Heart disease Father    Diabetes Sister    Cancer Maternal Grandfather    Stroke Paternal Grandmother    Prostate cancer Neg Hx    Bladder Cancer Neg Hx    Kidney cancer Neg Hx    Social History   Socioeconomic History   Marital status: Widowed    Spouse name: JuBethena Roys Number of children: Not on file   Years of education: Not on file   Highest education level: Not on file  Occupational History   Occupation: retired  Tobacco Use   Smoking status: Former    Packs/day: 1.00    Years: 30.00    Total pack years: 30.00    Types: Cigarettes    Quit date: 07/19/1999    Years since quitting: 22.2   Smokeless tobacco: Never  Vaping Use   Vaping Use: Never used  Substance and Sexual Activity   Alcohol use: No    Alcohol/week: 0.0 standard drinks of alcohol   Drug use: No   Sexual activity: Yes    Partners: Female  Other Topics Concern   Not on file  Social History Narrative   Not on file   Social Determinants of Health   Financial Resource Strain: Low Risk  (09/24/2021)   Overall Financial Resource Strain (CARDIA)    Difficulty of Paying Living Expenses: Not hard at all  Food Insecurity: No Food Insecurity (09/24/2021)   Hunger Vital Sign    Worried About Running Out of Food in the Last Year: Never true    [place] the Last Year: Never true  Transportation Needs: No Transportation Needs (09/24/2021)   PRAPARE - TrHydrologistMedical): No    Lack of  Transportation (Non-Medical): No  Physical Activity: Insufficiently Active (09/24/2021)   Exercise Vital Sign    Days of Exercise per Week: 3 days    Minutes of Exercise per Session: 20 min  Stress: No Stress Concern Present (09/24/2021)   [place]  Feeling of Stress : Only a little  Social Connections: Moderately  Isolated (09/24/2021)   Social Connection and Isolation Panel [NHANES]    Frequency of Communication with Friends and Family: Twice a week    Frequency of Social Gatherings with Friends and Family: Twice a week    Attends Religious Services: 1 to 4 times per year    Active Member of [employer] or Organizations: Not on file    Attends Archivist Meetings: Never    Marital Status: Widowed    Tobacco Counseling Does not qualify for counseling   Clinical Intake:  Pre-visit preparation completed: Yes  Pain : No/denies pain     Nutritional Status: BMI of 19-24  Normal Nutritional Risks: None Diabetes: Yes CBG done?: No Did pt. bring in CBG monitor from home?: No  How often do you need to have someone help you when you read instructions, pamphlets, or other written materials from your doctor or pharmacy?: 1 - Never  Diabetic?Nutrition Risk Assessment:  Has the patient had any N/V/D within the last 2 months?  No  Does the patient have any non-healing wounds?  No  Has the patient had any unintentional weight loss or weight gain?  No   Diabetes:  Is the patient diabetic?  Yes  If diabetic, was a CBG obtained today?  No  Did the patient bring in their glucometer from home?   Virtual appt, pt reports his blood sugar was 198 , non-fasting blood sugar. How often do you monitor your CBG's? daily.   Financial Strains and Diabetes Management:  Are you having any financial strains with the device, your supplies or your medication? No .  Does the patient want to be seen by Chronic Care Management for  management of their diabetes?  No  Would the patient like to be referred to a Nutritionist or for Diabetic Management?  No   Diabetic Exams:  Diabetic Eye Exam: Completed 09/12/2021 Diabetic Foot Exam: Completed 07/02/2021    Interpreter Needed?: No  Information entered by :: Donnie Mesa, CMA   Activities of Daily Living    09/24/2021    8:31 AM 07/02/2021    1:00 PM  In your present state of health, do you have any difficulty performing the following activities:  Hearing? 0 0  Vision? 0 0  Difficulty concentrating or making decisions? 0 0  Walking or climbing stairs? 0 0  Dressing or bathing? 0 0  Doing errands, shopping? 0 0    Patient Care Team: Bo Merino, FNP as PCP - General (Nurse Practitioner) Judi Cong, MD as Physician Assistant (Endocrinology) Sindy Guadeloupe, MD as Consulting Physician (Oncology) Abbie Sons, MD (Urology)  Indicate any recent Medical Services you may have received from other than [facility] providers in the past year (date may be approximate).  No hospitalization in the past 12 months.    Assessment:   This is a routine wellness examination for Garrett Yoder.  Hearing/Vision screen Pt denies any hearing issues. [facility] Ex[facility].   Dietary issues and exercise activities discussed: Current Exercise Habits: Structured exercise class, Type of exercise: walking, Time (Minutes): 15, Frequency (Times/Week): 4, Weekly Exercise (Minutes/Week): 60, Intensity: Moderate   Goals Addressed             This Visit's Progress    Stay Active and Independent         Depression Screen    09/24/2021    8:31 AM 07/02/2021    1:00 PM 04/03/2021    9:36 AM 01/03/2021  1:06 PM 08/16/2020    2:15 PM 07/12/2020    4:09 PM 08/17/2019    8:44 AM  PHQ 2/9 Scores  PHQ - 2 Score 0 0 0 0 0 2 0  PHQ- 9 Score     0 3 0    Fall Risk    09/24/2021    1:03 PM 09/24/2021    8:31 AM 07/02/2021    1:00 PM 04/03/2021    9:35 AM 01/03/2021     1:06 PM  Fall Risk   Falls in the past year? 0 0 0 0 0  Number falls in past yr:  0 0 0 0  Injury with Fall?  0 0 0 0  Follow up  Falls evaluation completed Falls evaluation completed Falls evaluation completed Falls evaluation completed    [place]:  Any stairs in or around the home? Yes  If so, are there any without handrails? Yes  Home free of loose throw rugs in walkways, pet beds, electrical cords, etc? Yes  Adequate lighting in your home to reduce risk of falls? Yes   ASSISTIVE DEVICES UTILIZED TO PREVENT FALLS:  Life alert? Yes  Use of a cane, walker or w/c? No  Grab bars in the bathroom? Yes  Shower chair or bench in shower? Yes  Elevated toilet seat or a handicapped toilet? No   TIMED UP AND GO:  Was the test performed?  Unable to perform, virtual appointment  .     Cognitive Function:        09/24/2021    1:04 PM 03/31/2019   11:33 AM  6CIT Screen  What Year? 0 points 0 points  What month? 0 points 0 points  What time? 0 points 0 points  Count back from 20 0 points 0 points  Months in reverse 0 points 0 points  Repeat phrase 0 points 0 points  Total Score 0 points 0 points    Immunizations Immunization History  Administered Date(s) Administered   Fluad Quad(high Dose 65+) 11/29/2018, 01/03/2021   Influenza, High Dose Seasonal PF 11/20/2014, 01/17/2016, 11/25/2016, 01/26/2018   Influenza,inj,Quad PF,6+ Mos 12/26/2013   Influenza-Unspecified 01/18/2008, 11/28/2008, 01/14/2010, 12/10/2010, 02/02/2012, 02/01/2013, 12/26/2013   PFIZER Comirnaty(Gray Top)Covid-19 Tri-Sucrose Vaccine 05/13/2019, 06/03/2019   PFIZER(Purple Top)SARS-COV-2 Vaccination 05/13/2019, 06/03/2019   Pneumococcal Conjugate-13 04/21/2013   Pneumococcal Polysaccharide-23 08/27/2011   Td 06/30/2006   Zoster, Live 02/02/2012    TDAP status: Due, Education has been provided regarding the importance of this vaccine. Advised may receive this vaccine at  local pharmacy or Health Dept. Aware to provide a copy of the vaccination record if obtained from local pharmacy or Health Dept. Verbalized acceptance and understanding.  Flu Vaccine status: Due, Education has been provided regarding the importance of this vaccine. Advised may receive this vaccine at local pharmacy or Health Dept. Aware to provide a copy of the vaccination record if obtained from local pharmacy or Health Dept. Verbalized acceptance and understanding.  Pneumococcal vaccine status: Completed during today's visit.  Covid-19 vaccine status: Completed vaccines  Qualifies for Shingles Vaccine? Yes   Zostavax completed No   Shingrix Completed?: No.    Education has been provided regarding the importance of this vaccine. Patient has been advised to call insurance company to determine out of pocket expense if they have not yet received this vaccine. Advised may also receive vaccine at local pharmacy or Health Dept. Verbalized acceptance and understanding.  Screening Tests Health Maintenance  Topic Date Due  INFLUENZA VACCINE  09/17/2021   Zoster Vaccines- Shingrix (1 of 2) 10/02/2021 (Originally 05/22/1996)   COVID-19 Vaccine (5 - Pfizer series) 10/10/2021 (Originally 07/29/2019)   TETANUS/TDAP  04/03/2022 (Originally 06/29/2016)   HEMOGLOBIN A1C  01/02/2022   FOOT EXAM  07/03/2022   COLONOSCOPY (Pts 45-99yr Insurance coverage will need to be confirmed)  09/06/2022   OPHTHALMOLOGY EXAM  09/13/2022   Pneumonia Vaccine 75 Years old  Completed   HPV VACCINES  Aged Out   Hepatitis C Screening  Discontinued    Health Maintenance  Health Maintenance Due  Topic Date Due   INFLUENZA VACCINE  09/17/2021    Colorectal Screening: Pt would like to get a colon cancer screening, but concern that he will not have anyone to pick him up post the colonoscopy.   Lung Cancer Screening: (Low Dose CT Chest recommended if Age 75-80years, 30 pack-year currently smoking OR have quit w/in  15years.) does not qualify.   Lung Cancer Screening Referral: does not qualify   Additional Screening:  Hepatitis C Screening: does qualify; pt has not had a Hepatitis C vaccine   Vision Screening: Recommended annual ophthalmology exams for early detection of glaucoma and other disorders of the eye. Is the patient up to date with their annual eye exam?  Yes  Who is the provider or what is the name of the office in which the patient attends annual [facility] exams[facility] If pt is not established with a provider, would they like to be referred to a provider to establish care? No .   Dental Screening: Recommended annual dental exams for proper oral hygiene  Community Resource Referral / Chronic Care Management: CRR required this visit?  No   CCM required this visit?  No      Plan:     I have personally reviewed and noted the following in the patient's chart:   Medical and social history Use of alcohol, tobacco or illicit drugs  Current medications and supplements including opioid prescriptions. Patient is not currently taking opioid prescriptions. Functional ability and status Nutritional status Physical activity Advanced directives List of other physicians Hospitalizations, surgeries, and ER visits in previous 12 months Vitals Screenings to include cognitive, depression, and falls Referrals and appointments  In addition, I have reviewed and discussed with patient certain preventive protocols, quality metrics, and best practice recommendations. A written personalized care plan for preventive services as well as general preventive health recommendations were provided to patient.    Garrett Yoder, Thank you for taking time to come for your Medicare Wellness Visit. I appreciate your ongoing commitment to your health goals. Please review the following plan we discussed and let me know if I can assist you in the future.   These are the goals we discussed:  Goals       Patient Stated     Pt states he would like to remain active and healthy over the next year     Stay Active and Independent        This is a list of the screening recommended for you and due dates:  Health Maintenance  Topic Date Due   Flu Shot  09/17/2021   Zoster (Shingles) Vaccine (1 of 2) 10/02/2021*   COVID-19 Vaccine (5 - Pfizer series) 10/10/2021*   Tetanus Vaccine  04/03/2022*   Hemoglobin A1C  01/02/2022   Complete foot exam   07/03/2022   Colon Cancer Screening  09/06/2022   Eye exam for diabetics  09/13/2022   Pneumonia Vaccine  Completed   HPV Vaccine  Aged Out   Hepatitis C Screening: USPSTF Recommendation to screen - Ages 73-79 yo.  Discontinued  *Topic was postponed. The date shown is not the original due date.     Wilson Singer, [place]   09/24/2021   Nurse Notes: Non-face-to-face 40 minutes

## 2021-09-24 NOTE — Patient Instructions (Signed)
Health Maintenance, Male Adopting a healthy lifestyle and getting preventive care are important in promoting health and wellness. Ask your health care provider about: The right schedule for you to have regular tests and exams. Things you can do on your own to prevent diseases and keep yourself healthy. What should I know about diet, weight, and exercise? Eat a healthy diet  Eat a diet that includes plenty of vegetables, fruits, low-fat dairy products, and lean protein. Do not eat a lot of foods that are high in solid fats, added sugars, or sodium. Maintain a healthy weight Body mass index (BMI) is a measurement that can be used to identify possible weight problems. It estimates body fat based on height and weight. Your health care provider can help determine your BMI and help you achieve or maintain a healthy weight. Get regular exercise Get regular exercise. This is one of the most important things you can do for your health. Most adults should: Exercise for at least 150 minutes each week. The exercise should increase your heart rate and make you sweat (moderate-intensity exercise). Do strengthening exercises at least twice a week. This is in addition to the moderate-intensity exercise. Spend less time sitting. Even light physical activity can be beneficial. Watch cholesterol and blood lipids Have your blood tested for lipids and cholesterol at 75 years of age, then have this test every 5 years. You may need to have your cholesterol levels checked more often if: Your lipid or cholesterol levels are high. You are older than 75 years of age. You are at high risk for heart disease. What should I know about cancer screening? Many types of cancers can be detected early and may often be prevented. Depending on your health history and family history, you may need to have cancer screening at various ages. This may include screening for: Colorectal cancer. Prostate cancer. Skin cancer. Lung  cancer. What should I know about heart disease, diabetes, and high blood pressure? Blood pressure and heart disease High blood pressure causes heart disease and increases the risk of stroke. This is more likely to develop in people who have high blood pressure readings or are overweight. Talk with your health care provider about your target blood pressure readings. Have your blood pressure checked: Every 3-5 years if you are 18-39 years of age. Every year if you are 40 years old or older. If you are between the ages of 65 and 75 and are a current or former smoker, ask your health care provider if you should have a one-time screening for abdominal aortic aneurysm (AAA). Diabetes Have regular diabetes screenings. This checks your fasting blood sugar level. Have the screening done: Once every three years after age 45 if you are at a normal weight and have a low risk for diabetes. More often and at a younger age if you are overweight or have a high risk for diabetes. What should I know about preventing infection? Hepatitis B If you have a higher risk for hepatitis B, you should be screened for this virus. Talk with your health care provider to find out if you are at risk for hepatitis B infection. Hepatitis C Blood testing is recommended for: Everyone born from 1945 through 1965. Anyone with known risk factors for hepatitis C. Sexually transmitted infections (STIs) You should be screened each year for STIs, including gonorrhea and chlamydia, if: You are sexually active and are younger than 75 years of age. You are older than 75 years of age and your   health care provider tells you that you are at risk for this type of infection. Your sexual activity has changed since you were last screened, and you are at increased risk for chlamydia or gonorrhea. Ask your health care provider if you are at risk. Ask your health care provider about whether you are at high risk for HIV. Your health care provider  may recommend a prescription medicine to help prevent HIV infection. If you choose to take medicine to prevent HIV, you should first get tested for HIV. You should then be tested every 3 months for as long as you are taking the medicine. Follow these instructions at home: Alcohol use Do not drink alcohol if your health care provider tells you not to drink. If you drink alcohol: Limit how much you have to 0-2 drinks a day. Know how much alcohol is in your drink. In the [place] one drink equals one 12 oz bottle of beer (355 mL), one 5 oz glass of wine (148 mL), or one 1 oz glass of hard liquor (44 mL). Lifestyle Do not use any products that contain nicotine or tobacco. These products include cigarettes, chewing tobacco, and vaping devices, such as e-cigarettes. If you need help quitting, ask your health care provider. Do not use street drugs. Do not share needles. Ask your health care provider for help if you need support or information about quitting drugs. General instructions Schedule regular health, dental, and eye exams. Stay current with your vaccines. Tell your health care provider if: You often feel depressed. You have ever been abused or do not feel safe at home. Summary Adopting a healthy lifestyle and getting preventive care are important in promoting health and wellness. Follow your health care provider's instructions about healthy diet, exercising, and getting tested or screened for diseases. Follow your health care provider's instructions on monitoring your cholesterol and blood pressure. This information is not intended to replace advice given to you by your health care provider. Make sure you discuss any questions you have with your health care provider. Document Revised: 06/25/2020 Document Reviewed: 06/25/2020 Elsevier Patient Education  2023 Elsevier Inc.  

## 2021-09-26 ENCOUNTER — Encounter: Payer: Self-pay | Admitting: Nurse Practitioner

## 2021-09-27 ENCOUNTER — Encounter: Payer: Self-pay | Admitting: Nurse Practitioner

## 2021-10-02 ENCOUNTER — Ambulatory Visit: Payer: Medicare Other | Admitting: Urology

## 2021-10-18 ENCOUNTER — Ambulatory Visit: Payer: Medicare Other | Admitting: Urology

## 2021-11-06 ENCOUNTER — Ambulatory Visit: Payer: Medicare Other | Admitting: Urology

## 2021-11-20 ENCOUNTER — Other Ambulatory Visit: Payer: Self-pay | Admitting: Nurse Practitioner

## 2021-11-20 DIAGNOSIS — E782 Mixed hyperlipidemia: Secondary | ICD-10-CM

## 2021-11-20 DIAGNOSIS — I10 Essential (primary) hypertension: Secondary | ICD-10-CM

## 2021-11-20 NOTE — Telephone Encounter (Signed)
Requested Prescriptions  Pending Prescriptions Disp Refills  . atorvastatin (LIPITOR) 40 MG tablet [Pharmacy Med Name: Atorvastatin Calcium 40 MG Oral Tablet] 100 tablet 1    Sig: TAKE 1 TABLET BY MOUTH DAILY     Cardiovascular:  Antilipid - Statins Failed - 11/20/2021  8:23 AM      Failed - Lipid Panel in normal range within the last 12 months    Cholesterol, Total  Date Value Ref Range Status  07/24/2016 155 100 - 199 mg/dL Final   Cholesterol  Date Value Ref Range Status  07/02/2021 138 <200 mg/dL Final   LDL Cholesterol (Calc)  Date Value Ref Range Status  07/02/2021 57 mg/dL (calc) Final    Comment:    Reference range: <100 . Desirable range <100 mg/dL for primary prevention;   <70 mg/dL for patients with CHD or diabetic patients  with > or = 2 CHD risk factors. Marland Kitchen LDL-C is now calculated using the Martin-Hopkins  calculation, which is a validated novel method providing  better accuracy than the Friedewald equation in the  estimation of LDL-C.  Cresenciano Genre et al. Annamaria Helling. 1308;657(84): 2061-2068  (http://education.[employer].com/faq/FAQ164)    HDL  Date Value Ref Range Status  07/02/2021 60 > OR = 40 mg/dL Final  07/24/2016 62 >39 mg/dL Final   Triglycerides  Date Value Ref Range Status  07/02/2021 127 <150 mg/dL Final         Passed - Patient is not pregnant      Passed - Valid encounter within last 12 months    Recent Outpatient Visits          4 months ago Type 2 diabetes mellitus with hyperglycemia, with long-term current use of insulin [facility])   [place], FNP   7 months ago Essential hypertension, benign   [facility] Bo Merino, FNP   10 months ago Essential hypertension, benign   [facility] Bo Merino, FNP   1 year ago Chronic kidney disease, stage 3b [facility])   [facility] Kathrine Haddock, NP   2 years ago Hyperkalemia   [place], MD      Future Appointments            In 1 week [place], MD [place]   In 1 month Reece Packer, Myna Hidalgo, [facility], [place]   In 10 months Reece Packer, Myna Hidalgo, [facility], [place]           . irbesartan (AVAPRO) 150 MG tablet [Pharmacy Med Name: Irbesartan 150 MG Oral Tablet] 100 tablet 2    Sig: TAKE 1 TABLET BY MOUTH DAILY     Cardiovascular:  Angiotensin Receptor Blockers Failed - 11/20/2021  8:23 AM      Failed - Cr in normal range and within 180 days    Creat  Date Value Ref Range Status  07/02/2021 1.66 (H) 0.70 - 1.28 mg/dL Final   Creatinine, Urine  Date Value Ref Range Status  07/17/2017 123 20 - 320 mg/dL Final         Passed - K in normal range and within 180 days    Potassium  Date Value Ref Range Status  07/02/2021 4.8 3.5 - 5.3 mmol/L Final         Passed - Patient is not pregnant      Passed - Last BP in normal  range    BP Readings from Last 1 Encounters:  07/02/21 124/78         Passed - Valid encounter within last 6 months    Recent Outpatient Visits          4 months ago Type 2 diabetes mellitus with hyperglycemia, with long-term current use of insulin [facility])   [place], FNP   7 months ago Essential hypertension, benign   [facility] Bo Merino, FNP   10 months ago Essential hypertension, benign   [facility] Bo Merino, FNP   1 year ago Chronic kidney disease, stage 3b [facility])   [facility] Kathrine Haddock, NP   2 years ago Hyperkalemia   [place], MD      Future Appointments            In 1 week [place], MD [place]   In 1 month Reece Packer, Myna Hidalgo, [facility], [place]   In 10 months Reece Packer, Myna Hidalgo, [facility],  [facility]

## 2021-11-27 ENCOUNTER — Ambulatory Visit: Payer: Medicare Other | Admitting: Urology

## 2021-12-02 ENCOUNTER — Other Ambulatory Visit: Payer: Self-pay | Admitting: Urology

## 2021-12-02 ENCOUNTER — Other Ambulatory Visit: Payer: Medicare Other

## 2021-12-02 ENCOUNTER — Ambulatory Visit: Payer: Medicare Other | Admitting: Oncology

## 2021-12-20 ENCOUNTER — Ambulatory Visit: Payer: Medicare Other | Admitting: Urology

## 2022-01-02 ENCOUNTER — Ambulatory Visit (INDEPENDENT_AMBULATORY_CARE_PROVIDER_SITE_OTHER): Payer: Medicare Other | Admitting: Nurse Practitioner

## 2022-01-02 ENCOUNTER — Other Ambulatory Visit: Payer: Self-pay

## 2022-01-02 ENCOUNTER — Ambulatory Visit: Payer: Medicare Other | Attending: Cardiovascular Disease | Admitting: Cardiovascular Disease

## 2022-01-02 ENCOUNTER — Encounter: Payer: Self-pay | Admitting: Nurse Practitioner

## 2022-01-02 ENCOUNTER — Encounter: Payer: Self-pay | Admitting: Cardiovascular Disease

## 2022-01-02 ENCOUNTER — Other Ambulatory Visit
Admission: RE | Admit: 2022-01-02 | Discharge: 2022-01-02 | Disposition: A | Discharge status: Home / Self Care | Payer: Medicare Other | Source: Ambulatory Visit | Attending: Cardiovascular Disease | Admitting: Cardiovascular Disease

## 2022-01-02 VITALS — BP 118/62 | HR 107 | Ht 68.0 in | Wt 146.5 lb

## 2022-01-02 VITALS — BP 120/80 | HR 98 | Temp 98.1°F | Resp 14 | Ht 65.0 in | Wt 146.9 lb

## 2022-01-02 DIAGNOSIS — I10 Essential (primary) hypertension: Secondary | ICD-10-CM

## 2022-01-02 DIAGNOSIS — I700 Atherosclerosis of aorta: Secondary | ICD-10-CM

## 2022-01-02 DIAGNOSIS — N1832 Chronic kidney disease, stage 3b: Secondary | ICD-10-CM

## 2022-01-02 DIAGNOSIS — Z23 Encounter for immunization: Secondary | ICD-10-CM | POA: Diagnosis not present

## 2022-01-02 DIAGNOSIS — I4891 Unspecified atrial fibrillation: Secondary | ICD-10-CM

## 2022-01-02 DIAGNOSIS — E782 Mixed hyperlipidemia: Secondary | ICD-10-CM

## 2022-01-02 DIAGNOSIS — E1165 Type 2 diabetes mellitus with hyperglycemia: Secondary | ICD-10-CM

## 2022-01-02 DIAGNOSIS — E785 Hyperlipidemia, unspecified: Secondary | ICD-10-CM | POA: Diagnosis not present

## 2022-01-02 DIAGNOSIS — D649 Anemia, unspecified: Secondary | ICD-10-CM

## 2022-01-02 DIAGNOSIS — I499 Cardiac arrhythmia, unspecified: Secondary | ICD-10-CM

## 2022-01-02 DIAGNOSIS — D75839 Thrombocytosis, unspecified: Secondary | ICD-10-CM

## 2022-01-02 DIAGNOSIS — Z794 Long term (current) use of insulin: Secondary | ICD-10-CM

## 2022-01-02 LAB — COMPREHENSIVE METABOLIC PANEL
ALT: 23 U/L (ref 0–44)
AST: 23 U/L (ref 15–41)
Albumin: 4.1 g/dL (ref 3.5–5.0)
Alkaline Phosphatase: 101 U/L (ref 38–126)
Anion gap: 9 (ref 5–15)
BUN: 22 mg/dL (ref 8–23)
CO2: 25 mmol/L (ref 22–32)
Calcium: 9.1 mg/dL (ref 8.9–10.3)
Chloride: 102 mmol/L (ref 98–111)
Creatinine, Ser: 1.35 mg/dL — ABNORMAL HIGH (ref 0.61–1.24)
GFR, Estimated: 55 mL/min — ABNORMAL LOW (ref >60)
Glucose, Bld: 261 mg/dL — ABNORMAL HIGH (ref 70–99)
Potassium: 4.6 mmol/L (ref 3.5–5.1)
Sodium: 136 mmol/L (ref 135–145)
Total Bilirubin: 0.6 mg/dL (ref 0.3–1.2)
Total Protein: 6.7 g/dL (ref 6.5–8.1)

## 2022-01-02 LAB — CBC
HCT: 34.4 % — ABNORMAL LOW (ref 39.0–52.0)
Hemoglobin: 11.2 g/dL — ABNORMAL LOW (ref 13.0–17.0)
MCH: 30.3 pg (ref 26.0–34.0)
MCHC: 32.6 g/dL (ref 30.0–36.0)
MCV: 93 fL (ref 80.0–100.0)
Platelets: 475 10*3/uL — ABNORMAL HIGH (ref 150–400)
RBC: 3.7 MIL/uL — ABNORMAL LOW (ref 4.22–5.81)
RDW: 12.6 % (ref 11.5–15.5)
WBC: 7.4 10*3/uL (ref 4.0–10.5)
nRBC: 0 % (ref 0.0–0.2)

## 2022-01-02 LAB — TSH: TSH: 2.829 u[IU]/mL (ref 0.350–4.500)

## 2022-01-02 MED ORDER — METOPROLOL TARTRATE 25 MG PO TABS: 25.0000 mg | ORAL_TABLET | Freq: Two times a day (BID) | ORAL | 1 refills | Status: DC

## 2022-01-02 MED ORDER — APIXABAN 5 MG PO TABS: 5.0000 mg | ORAL_TABLET | Freq: Two times a day (BID) | ORAL | 11 refills | Status: DC

## 2022-01-02 MED ORDER — IRBESARTAN 75 MG PO TABS: 75.0000 mg | ORAL_TABLET | Freq: Every day | ORAL | 1 refills | Status: DC

## 2022-01-02 NOTE — Assessment & Plan Note (Signed)
He currently takes irbesartan 150 mg daily.

## 2022-01-02 NOTE — Progress Notes (Signed)
Cardiology Office Note   Date:  01/02/2022   ID:  Garrett Spikes., DOB 02/19/1945, MRN [id number]  PCP:  Bo Merino, FNP  Cardiologist:   Kathlyn Sacramento, MD   Chief Complaint  Patient presents with   Other    F/u Tachycardia. Meds reviewed verbally with pt.      History of Present Illness: Garrett Yoder. is a 76 y.o. male who was referred by Garrett Yoder for evaluation of atrial fibrillation. He has known history of type 2 diabetes, chronic kidney disease, anemia, previous tobacco use, hyperlipidemia, aortic/coronary calcifications and arthritis.  He has family history of coronary artery disease. He was seen by me in 2018 for aortic and coronary calcifications noted on CT scan. He underwent a treadmill nuclear stress test in December 2018 which showed no evidence of ischemia with normal ejection fraction.  He was noted to have poor exercise capacity at that time and was helped to stage I. He went for a routine visit with his primary care provider today and was noted to be tachycardic.  An EKG showed atrial fibrillation with rapid ventricular response.  This is a new diagnosis for him.  The patient is completely asymptomatic and denies chest pain, shortness of breath, palpitations or dizziness.  No previous history of stroke.  Past Medical History:  Diagnosis Date   Anemia 08/14/2015   Anxiety    Aortic atherosclerosis ([place]) 11/25/2016   Chest CT Oct 2018   Aorto-iliac atherosclerosis ([place]) 11/25/2016   Abd/pelvic CT Oct 2018   Coronary artery calcification seen on CAT scan 11/25/2016   Chest CT Oct 2018; refer to Dr. Fletcher Anon   DDD (degenerative disc disease), cervical 08/21/2016   Depression    Diabetes mellitus without complication ([place])    Facet arthropathy, cervical 08/21/2016   GERD (gastroesophageal reflux disease)    Heart murmur    As child   Hyperlipidemia    Hypertension    Hypertrophy of prostate with urinary obstruction 11/25/2016   Pelvic CT October 2018    Incidental lung nodule, > 61m and < 866m10/10/2016   5 mm nodule LLL, chest CT Oct 2018   Thrombocytosis 08/14/2015    Past Surgical History:  Procedure Laterality Date   ANTERIOR CERVICAL DECOMP/DISCECTOMY FUSION N/A 09/24/2016   Procedure: ANTERIOR CERVICAL DECOMPRESSION/DISCECTOMY FUSION 3 LEVELS-C4-7;  Surgeon: YaMeade MawMD;  Location: [facility] ORS;  Service: Neurosurgery;  Laterality: N/A;   CATARACT EXTRACTION     ELECTROMAGNETIC NAVIGATION BROCHOSCOPY Right 04/07/2018   Procedure: ELECTROMAGNETIC NAVIGATION BRONCHOSCOPY;  Surgeon: KaFlora LippsMD;  Location: [facility] ORS;  Service: Cardiopulmonary;  Laterality: Right;   EYE SURGERY Right    Cataract Extraction with IOL   RETINAL LASER PROCEDURE       Current Outpatient Medications  Medication Sig Dispense Refill   acetaminophen (TYLENOL) 500 MG tablet Take 1,000 mg by mouth 2 (two) times daily as needed.      atorvastatin (LIPITOR) 40 MG tablet TAKE 1 TABLET BY MOUTH DAILY 100 tablet 1   bimatoprost (LUMIGAN) 0.01 % SOLN Place 1 drop into both eyes at bedtime.      finasteride (PROSCAR) 5 MG tablet TAKE 1 TABLET BY MOUTH DAILY 100 tablet 2   insulin detemir (LEVEMIR FLEXTOUCH) 100 UNIT/ML FlexPen Inject 26 Units into the skin every evening.     insulin lispro (HUMALOG) 100 UNIT/ML KwikPen Inject 6 Units into the skin daily. Pt taking 6 units in AM, 10 units at noon &  20 units at supper     irbesartan (AVAPRO) 150 MG tablet TAKE 1 TABLET BY MOUTH DAILY 100 tablet 0   metFORMIN (GLUCOPHAGE) 1000 MG tablet Take 500 mg by mouth daily with breakfast.     Multiple Vitamin (MULTI-VITAMINS) TABS Take 1 tablet by mouth 2 (two) times daily.      pioglitazone (ACTOS) 15 MG tablet Take 15 mg by mouth daily.     SIMBRINZA 1-0.2 % SUSP Place 1 drop into both eyes 3 (three) times daily.      tamsulosin (FLOMAX) 0.4 MG CAPS capsule TAKE 1 CAPSULE BY MOUTH  DAILY 90 capsule 3   No current facility-administered medications for this visit.     Allergies:   Codeine    Social History:  The patient  reports that he quit smoking about 22 years ago. His smoking use included cigarettes. He has a 30.00 pack-year smoking history. He has never used smokeless tobacco. He reports that he does not drink alcohol and does not use drugs.   Family History:  The patient's family history includes Cancer in his maternal grandfather; Diabetes in his sister; Diabetes Mellitus II in his father; Heart Problems in his father; Heart disease in his father; Stroke in his mother and paternal grandmother.    ROS:  Please see the history of present illness.   Otherwise, review of systems are positive for none.   All other systems are reviewed and negative.    PHYSICAL EXAM: VS:  BP 118/62 (BP Location: Right Arm, Patient Position: Sitting, Cuff Size: Normal)   Pulse (!) 107   Ht '5\' 8"'$  (1.727 m)   Wt 146 lb 8 oz (66.5 kg)   SpO2 99%   BMI 22.28 kg/m  , BMI Body mass index is 22.28 kg/m. GEN: Well nourished, well developed, in no acute distress  HEENT: normal  Neck: no JVD, carotid bruits, or masses Cardiac: Irregularly irregular and tachycardic; no murmurs, rubs, or gallops,no edema  Respiratory:  clear to auscultation bilaterally, normal work of breathing GI: soft, nontender, nondistended, + BS MS: no deformity or atrophy  Skin: warm and dry, no rash Neuro:  Strength and sensation are intact Psych: euthymic mood, full affect   EKG:  EKG is not ordered today. I reviewed the EKG done at his primary care physician's office with atrial fibrillation with rapid ventricular response.   Recent Labs: 07/02/2021: ALT 24; BUN 25; Creat 1.66; Hemoglobin 12.0; Platelets 452; Potassium 4.8; Sodium 139    Lipid Panel    Component Value Date/Time   CHOL 138 07/02/2021 1350   CHOL 155 07/24/2016 0829   TRIG 127 07/02/2021 1350   HDL 60 07/02/2021 1350   HDL 62 07/24/2016 0829   CHOLHDL 2.3 07/02/2021 1350   VLDL 22 08/14/2015 1532   LDLCALC 57  07/02/2021 1350      Wt Readings from Last 3 Encounters:  01/02/22 146 lb 8 oz (66.5 kg)  01/02/22 146 lb 14.4 oz (66.6 kg)  09/24/21 142 lb (64.4 kg)          01/02/2022    3:18 PM 02/02/2017    1:29 PM  PAD Screen  Previous PAD dx? No No  Previous surgical procedure? No No  Pain with walking? No No  Feet/toe relief with dangling? No No  Painful, non-healing ulcers? No No  Extremities discolored? No No      ASSESSMENT AND PLAN:  1.  Atrial fibrillation: The exact onset is not entirely clear as he seems  to be asymptomatic.  I requested routine labs today including TSH.  In order to control his rate, I added metoprolol tartrate 25 mg twice daily.  CHA2DS2-VASc Score = 5   :1} This indicates a 7.2% annual risk of stroke. The patient's score is based upon: CHF History: 0 HTN History: 1 Diabetes History: 1 Stroke History: 0 Vascular Disease History: 1 Age Score: 2 Gender Score: 0 Thus, I added Eliquis 5 mg twice daily.  Discontinue aspirin. I requested an echocardiogram. Follow-up in 1 month.  If he remains in atrial fibrillation, consider cardioversion after 4 weeks of anticoagulation.  2.  Essential hypertension: Given the addition of metoprolol, I elected to decrease irbesartan to 75 mg once daily to avoid hypotension.  3.  Hyperlipidemia: Currently on atorvastatin 40 mg once daily.  Most recent lipid profile showed an LDL of 57 which is at target.  4.  Aortic and coronary calcifications: Currently with no anginal symptoms or claudication.  5.  Chronic kidney disease: Renal function has been stable with most recent creatinine of 1.5.    Disposition:   FU in 1 month.  Signed,  Kathlyn Sacramento, MD  01/02/2022 3:38 PM    [place]

## 2022-01-02 NOTE — Assessment & Plan Note (Signed)
Continue follow up appointment with hematology

## 2022-01-02 NOTE — Patient Instructions (Signed)
Medication Instructions:  STOP the Aspirin  START Eliquis 5 mg twice daily START Metoprolol Tartrate 25 mg twice daily  DECREASE the Irbesartan to 75 mg once daily  *If you need a refill on your cardiac medications before your next appointment, please call your pharmacy*   Lab Work: Your provider would like for you to have the following labs drawn: CBC, CMET, TSH.   Please go to the [facility] entrance and check in at the front desk.  You do not need an appointment.  They are open from 7am-6 pm.  You will not need to be fasting.  If you have labs (blood work) drawn today and your tests are completely normal, you will receive your results only by: [place] (if you have MyChart) OR A paper copy in the mail If you have any lab test that is abnormal or we need to change your treatment, we will call you to review the results.   Testing/Procedures: Your physician has requested that you have an echocardiogram. Echocardiography is a painless test that uses sound waves to create images of your heart. It provides your doctor with information about the size and shape of your heart and how well your heart's chambers and valves are working.   You may receive an ultrasound enhancing agent through an IV if needed to better visualize your heart during the echo. This procedure takes approximately one hour.  There are no restrictions for this procedure.  This will take place at [place] ([place]) [id number], [place]    Follow-Up: At [facility], you and your health needs are our priority.  As part of our continuing mission to provide you with exceptional heart care, we have created designated Provider Care Teams.  These Care Teams include your primary Cardiologist (physician) and Advanced Practice Providers (APPs -  Physician Assistants and Nurse Practitioners) who all work together to provide you with the care you need, when you need it.  We  recommend signing up for the patient portal called "MyChart".  Sign up information is provided on this After Visit Summary.  MyChart is used to connect with patients for Virtual Visits (Telemedicine).  Patients are able to view lab/test results, encounter notes, upcoming appointments, etc.  Non-urgent messages can be sent to your provider as well.   To learn more about what you can do with MyChart, go to [email or web address].    Your next appointment:   Follow up with APP in one month

## 2022-01-02 NOTE — Assessment & Plan Note (Signed)
He is currently taking atorvastatin 40 mg daily.

## 2022-01-02 NOTE — Progress Notes (Signed)
BP 120/80   Pulse 98   Temp 98.1 F (36.7 C) (Oral)   Resp 14   Ht 5' 5" (1.651 m)   Wt 146 lb 14.4 oz (66.6 kg)   SpO2 99%   BMI 24.45 kg/m    Subjective:    Patient ID: Garrett Spikes., male    DOB: 1945/06/22, 76 y.o.   MRN: [id number]  HPI: Garrett Colpitts. is a 76 y.o. male  Chief Complaint  Patient presents with   Hypertension   Diabetes   Hyperlipidemia    6 month follow up   Type 2 diabetes: Patient's last A1C was 7.5 on 08/29/2021. He currently sees Dr. Gabriel Carina for his diabetes.  He reports his blood sugars have been running around 140s. He says he is doing better checking his sugar.   He denies any polyuria, polydipsia or polyphagia. He says he has had a couple low blood sugar readings in the 60s. Prior to his last visit with Dr. Gabriel Carina but has not had any since.  His last visit with Dr. Gabriel Carina was 09/10/2021. No changes to his current treatment plan were done at that time.  He is currently taking Levemir 30 units every night, adjust Humalog to 6 units before breakfast and 10 units before supper, he is also on actos 150 mg daily, metformin 1000 mg daily.   Hyperlipidemia: His last LDL was 57 on 07/02/2021.  He is currently taking atorvastatin 40 mg daily.  He denies any myalgia. Continue with current treatment.   Hypertension: His blood pressure today is 120/80.  He currently takes irbesartan 150 mg daily.  He denies any chest pain, headaches, blurred vision or shortness of breath.  Will continue with current treatment.   Chronic kidney disease: He is currently on an ARB.  His last GFR was 46 on 08/29/2021.  He does not see a nephrologist and would prefer not to.  He is aware that he needs to avoid NSAIDs.  Aortic atherosclerosis:  This ws noted on his CT chest in 2019.  He does currently take atorvastatin 40 mg daily.    Anemia: He last saw oncology/hematology on 05/27/2021. Plan from hem/onc was 1.  Thrombocytosis: Platelet counts fluctuate between 400-500 without a  clear increasing trend.  Myeloproliferative work-up including a bone marrow biopsy has been negative in the past.  Continue to monitor. 2.  Normocytic anemia: Presently his hemoglobin is stable between 11-12.  He also has an element of chronic kidney disease with a creatinine of 1.6.  He does not require any EPO at this time. Consider if hemoglobin <10.   Irregular heart rhythm:  upon assessment noted patient had irregular heart beat. Did EKG and EKG shows atrial fibrillation at rate of 123. Patient denies any chest pain, shortness of breath.  He last saw cardiology back in 2018 and that was with Dr. Fletcher Anon.  Contacted his office and they are going to work him in today. Patient given copy of EKG to take to appointment.    Relevant past medical, surgical, family and social history reviewed and updated as indicated. Interim medical history since our last visit reviewed. Allergies and medications reviewed and updated.  Review of Systems  Constitutional: Negative for fever or weight change.  Respiratory: Negative for cough and shortness of breath.   Cardiovascular: Negative for chest pain or palpitations.  Gastrointestinal: Negative for abdominal pain, no bowel changes.  Musculoskeletal: Negative for gait problem or joint swelling.  Skin: Negative for rash.  Neurological: Negative for dizziness or headache.  No other specific complaints in a complete review of systems (except as listed in HPI above).      Objective:    BP 120/80   Pulse 98   Temp 98.1 F (36.7 C) (Oral)   Resp 14   Ht 5' 5" (1.651 m)   Wt 146 lb 14.4 oz (66.6 kg)   SpO2 99%   BMI 24.45 kg/m   Wt Readings from Last 3 Encounters:  01/02/22 146 lb 14.4 oz (66.6 kg)  09/24/21 142 lb (64.4 kg)  07/02/21 143 lb 11.2 oz (65.2 kg)    Physical Exam  Constitutional: Patient appears well-developed and well-nourished.  No distress.  HEENT: head atraumatic, normocephalic, pupils equal and reactive to light,  neck  supple Cardiovascular: tachycardic rate, irregular rhythm and abnormal heart sounds.  No murmur heard. No BLE edema. Pulmonary/Chest: Effort normal and breath sounds normal. No respiratory distress. Abdominal: Soft.  There is no tenderness. Psychiatric: Patient has a normal mood and affect. behavior is normal. Judgment and thought content normal.  Diabetic Foot Exam - Simple   No data filed     Results for orders placed or performed in visit on 07/02/21  Lipid panel  Result Value Ref Range   Cholesterol 138 <200 mg/dL   HDL 60 > OR = 40 mg/dL   Triglycerides 127 <150 mg/dL   LDL Cholesterol (Calc) 57 mg/dL (calc)   Total CHOL/HDL Ratio 2.3 <5.0 (calc)   Non-HDL Cholesterol (Calc) 78 <130 mg/dL (calc)  CBC with Differential/Platelet  Result Value Ref Range   WBC 8.7 3.8 - 10.8 Thousand/uL   RBC 3.86 (L) 4.20 - 5.80 Million/uL   Hemoglobin 12.0 (L) 13.2 - 17.1 g/dL   HCT 36.3 (L) 38.5 - 50.0 %   MCV 94.0 80.0 - 100.0 fL   MCH 31.1 27.0 - 33.0 pg   MCHC 33.1 32.0 - 36.0 g/dL   RDW 12.6 11.0 - 15.0 %   Platelets 452 (H) 140 - 400 Thousand/uL   MPV 8.7 7.5 - 12.5 fL   Neutro Abs 6,716 1,500 - 7,800 cells/uL   Lymphs Abs 1,314 850 - 3,900 cells/uL   Absolute Monocytes 618 200 - 950 cells/uL   Eosinophils Absolute 26 15 - 500 cells/uL   Basophils Absolute 26 0 - 200 cells/uL   Neutrophils Relative % 77.2 %   Total Lymphocyte 15.1 %   Monocytes Relative 7.1 %   Eosinophils Relative 0.3 %   Basophils Relative 0.3 %  COMPLETE METABOLIC PANEL WITH GFR  Result Value Ref Range   Glucose, Bld 179 (H) 65 - 99 mg/dL   BUN 25 7 - 25 mg/dL   Creat 1.66 (H) 0.70 - 1.28 mg/dL   eGFR 43 (L) > OR = 60 mL/min/1.64m   BUN/Creatinine Ratio 15 6 - 22 (calc)   Sodium 139 135 - 146 mmol/L   Potassium 4.8 3.5 - 5.3 mmol/L   Chloride 102 98 - 110 mmol/L   CO2 27 20 - 32 mmol/L   Calcium 9.5 8.6 - 10.3 mg/dL   Total Protein 6.9 6.1 - 8.1 g/dL   Albumin 4.5 3.6 - 5.1 g/dL   Globulin 2.4 1.9 -  3.7 g/dL (calc)   AG Ratio 1.9 1.0 - 2.5 (calc)   Total Bilirubin 0.4 0.2 - 1.2 mg/dL   Alkaline phosphatase (APISO) 95 35 - 144 U/L   AST 18 10 - 35 U/L   ALT 24 9 - 46 U/L  Hemoglobin A1c  Result Value Ref Range   Hgb A1c MFr Bld 7.1 (H) <5.7 % of total Hgb   Mean Plasma Glucose 157 mg/dL   eAG (mmol/L) 8.7 mmol/L      Assessment & Plan:   Problem List Items Addressed This Visit       Cardiovascular and Mediastinum   Essential hypertension, benign    He currently takes irbesartan 150 mg daily.      Relevant Orders   EKG 12-Lead (Completed)   Aortic atherosclerosis ([place])     He is currently taking atorvastatin 40 mg daily.       Relevant Orders   EKG 12-Lead (Completed)     Endocrine   Type 2 diabetes mellitus with hyperglycemia, with long-term current use of insulin ([place])    He is currently taking Levemir 30 units every night, adjust Humalog to 6 units before breakfast and 10 units before supper, he is also on actos 150 mg daily, metformin 1000 mg daily.         Genitourinary   Chronic kidney disease, stage 3b ([place])    He currently takes irbesartan 150 mg daily.        Hematopoietic and Hemostatic   Thrombocytosis     Other   Hyperlipidemia - Primary     He is currently taking atorvastatin 40 mg daily.       Anemia    Continue follow up appointment with hematology      Other Visit Diagnoses     Need for influenza vaccination       Relevant Orders   Flu Vaccine QUAD High Dose(Fluad) (Completed)   Irregular heart rhythm       EKG perfomred, looks like atrial fibrillation, sending to cardiology this afternoon        Follow up plan: Return in about 6 months (around 07/03/2022) for follow up.

## 2022-01-02 NOTE — Assessment & Plan Note (Signed)
He is currently taking Levemir 30 units every night, adjust Humalog to 6 units before breakfast and 10 units before supper, he is also on actos 150 mg daily, metformin 1000 mg daily.

## 2022-01-13 ENCOUNTER — Other Ambulatory Visit: Payer: Self-pay

## 2022-01-13 DIAGNOSIS — D649 Anemia, unspecified: Secondary | ICD-10-CM

## 2022-01-14 ENCOUNTER — Inpatient Hospital Stay: Payer: Medicare Other | Attending: Oncology

## 2022-01-14 DIAGNOSIS — D649 Anemia, unspecified: Secondary | ICD-10-CM | POA: Diagnosis not present

## 2022-01-14 LAB — IRON AND TIBC
Iron: 102 ug/dL (ref 45–182)
Saturation Ratios: 23 % (ref 17.9–39.5)
TIBC: 440 ug/dL (ref 250–450)
UIBC: 338 ug/dL

## 2022-01-14 LAB — BASIC METABOLIC PANEL
Anion gap: 10 (ref 5–15)
BUN: 22 mg/dL (ref 8–23)
CO2: 24 mmol/L (ref 22–32)
Calcium: 9.3 mg/dL (ref 8.9–10.3)
Chloride: 98 mmol/L (ref 98–111)
Creatinine, Ser: 1.47 mg/dL — ABNORMAL HIGH (ref 0.61–1.24)
GFR, Estimated: 49 mL/min — ABNORMAL LOW (ref >60)
Glucose, Bld: 330 mg/dL — ABNORMAL HIGH (ref 70–99)
Potassium: 4.2 mmol/L (ref 3.5–5.1)
Sodium: 132 mmol/L — ABNORMAL LOW (ref 135–145)

## 2022-01-14 LAB — FERRITIN: Ferritin: 26 ng/mL (ref 24–336)

## 2022-01-14 LAB — CBC WITH DIFFERENTIAL/PLATELET
Abs Immature Granulocytes: 0.05 10*3/uL (ref 0.00–0.07)
Basophils Absolute: 0.1 10*3/uL (ref 0.0–0.1)
Basophils Relative: 1 %
Eosinophils Absolute: 0.1 10*3/uL (ref 0.0–0.5)
Eosinophils Relative: 1 %
HCT: 38.7 % — ABNORMAL LOW (ref 39.0–52.0)
Hemoglobin: 12.5 g/dL — ABNORMAL LOW (ref 13.0–17.0)
Immature Granulocytes: 1 %
Lymphocytes Relative: 17 %
Lymphs Abs: 1.7 10*3/uL (ref 0.7–4.0)
MCH: 30.9 pg (ref 26.0–34.0)
MCHC: 32.3 g/dL (ref 30.0–36.0)
MCV: 95.6 fL (ref 80.0–100.0)
Monocytes Absolute: 0.7 10*3/uL (ref 0.1–1.0)
Monocytes Relative: 7 %
Neutro Abs: 7.3 10*3/uL (ref 1.7–7.7)
Neutrophils Relative %: 73 %
Platelets: 487 10*3/uL — ABNORMAL HIGH (ref 150–400)
RBC: 4.05 MIL/uL — ABNORMAL LOW (ref 4.22–5.81)
RDW: 12.6 % (ref 11.5–15.5)
WBC: 9.8 10*3/uL (ref 4.0–10.5)
nRBC: 0 % (ref 0.0–0.2)

## 2022-01-30 ENCOUNTER — Ambulatory Visit: Payer: Medicare Other | Attending: Cardiovascular Disease

## 2022-01-30 DIAGNOSIS — I4891 Unspecified atrial fibrillation: Secondary | ICD-10-CM | POA: Diagnosis not present

## 2022-01-30 LAB — ECHOCARDIOGRAM COMPLETE
AR max vel: 3.2 cm2
AV Area VTI: 2.9 cm2
AV Area mean vel: 2.91 cm2
AV Mean grad: 1 mmHg
AV Peak grad: 2 mmHg
Ao pk vel: 0.7 m/s
Area-P 1/2: 3.82 cm2
S' Lateral: 2.9 cm

## 2022-01-30 NOTE — Progress Notes (Signed)
Cardiology Clinic Note   Patient Name: Garrett Yoder. Date of Encounter: 01/31/2022  Primary Care Provider:  Bo Merino, FNP Primary Cardiologist:  Garrett [place], MD  Patient Profile    76 year old male with past medical history of type 2 diabetes, CKD, anemia, previous tobacco use, hyperlipidemia, aortic/coronary calcifications, arthritis, and new onset atrial fibrillation, who is here today for follow-up.  Past Medical History    Past Medical History:  Diagnosis Date   Anemia 08/14/2015   Anxiety    Aortic atherosclerosis ([place]) 11/25/2016   Chest CT Oct 2018   Aorto-iliac atherosclerosis ([place]) 11/25/2016   Abd/pelvic CT Oct 2018   Coronary artery calcification seen on CAT scan 11/25/2016   Chest CT Oct 2018; refer to Dr. Fletcher Yoder   DDD (degenerative disc disease), cervical 08/21/2016   Depression    Diabetes mellitus without complication ([place])    Facet arthropathy, cervical 08/21/2016   GERD (gastroesophageal reflux disease)    Heart murmur    As child   Hyperlipidemia    Hypertension    Hypertrophy of prostate with urinary obstruction 11/25/2016   Pelvic CT October 2018   Incidental lung nodule, > 63m and < 865m10/10/2016   5 mm nodule LLL, chest CT Oct 2018   Thrombocytosis 08/14/2015   Past Surgical History:  Procedure Laterality Date   ANTERIOR CERVICAL DECOMP/DISCECTOMY FUSION N/A 09/24/2016   Procedure: ANTERIOR CERVICAL DECOMPRESSION/DISCECTOMY FUSION 3 LEVELS-C4-7;  Surgeon: Garrett MawMD;  Location: [facility] ORS;  Service: Neurosurgery;  Laterality: N/A;   CATARACT EXTRACTION     ELECTROMAGNETIC NAVIGATION BROCHOSCOPY Right 04/07/2018   Procedure: ELECTROMAGNETIC NAVIGATION BRONCHOSCOPY;  Surgeon: Garrett LippsMD;  Location: [facility] ORS;  Service: Cardiopulmonary;  Laterality: Right;   EYE SURGERY Right    Cataract Extraction with IOL   RETINAL LASER PROCEDURE      Allergies  Allergies  Allergen Reactions   Codeine Nausea Only    Other  reaction(s): makes him sick to his stomach Other reaction(s): makes him sick to his stomach    History of Present Illness    Garrett Yoder, [name] [age][place] old male with previously mentioned past medical history of type 2 diabetes, CKD, anemia, previous tobacco use, hyperlipidemia, aortic/coronary calcifications noted on CT, arthritis, and new onset atrial fibrillation.  He had aortic and coronary calcifications noted on CT scan in 2018.  He underwent treadmill nuclear stress testing in December 2018 which showed no evidence of ischemia with normal ejection fraction.  He was noted to have poor exercise capacity at that time and was helped to stage I.  He was last evaluated in clinic on 01/02/2022 by Dr. ArFletcher Anont the request of his primary care provider for new onset atrial fibrillation.  He had an EKG that showed atrial fibrillation with RVR and his routine yearly exam.  He was completely asymptomatic denying chest pain, shortness of breath, palpitations or dizziness.  There was also no previous history of stroke noted.  At his appointment labs requested including TSH, he was started on metoprolol tartrate 25 mg twice daily as well as apixaban 5 mg twice daily with the discontinuation of aspirin, and his irbesartan was decreased to 75 mg daily to avoid hypotension with initiation of beta-blocker to control rate.  He was also scheduled for an echocardiogram.  He would return in 1 month and if he remained in atrial fibrillation that we consider cardioversion after 4 weeks of uninterrupted anticoagulation.  Echocardiogram completed 01/30/2022 revealed  LVEF 50-55%, no regional wall motion abnormalities, right atrial size was mildly dilated, mild mitral valve regurgitation, moderate tricuspid regurgitation.  He returns to clinic today stating that he has been feeling well.  He feels better now than the prior to his first visit when he was started on metoprolol.  He denies any chest pain, shortness of  breath, palpitations, or peripheral edema.  He states that he is able to walk his dog now without any issues that he had before.  He has been compliant with his medication and has not missed any doses of his apixaban.  Denies any hospitalizations or visits to the emergency department.  Home Medications    Current Outpatient Medications  Medication Sig Dispense Refill   acetaminophen (TYLENOL) 500 MG tablet Take 1,000 mg by mouth 2 (two) times daily as needed.      apixaban (ELIQUIS) 5 MG TABS tablet Take 1 tablet (5 mg total) by mouth 2 (two) times daily. 60 tablet 11   atorvastatin (LIPITOR) 40 MG tablet TAKE 1 TABLET BY MOUTH DAILY 100 tablet 1   bimatoprost (LUMIGAN) 0.01 % SOLN Place 1 drop into both eyes at bedtime.      finasteride (PROSCAR) 5 MG tablet TAKE 1 TABLET BY MOUTH DAILY 100 tablet 2   insulin detemir (LEVEMIR FLEXTOUCH) 100 UNIT/ML FlexPen Inject 26 Units into the skin every evening.     insulin lispro (HUMALOG) 100 UNIT/ML KwikPen Inject 6 Units into the skin daily. Pt taking 6 units in AM, 10 units at noon & 20 units at supper     irbesartan (AVAPRO) 75 MG tablet Take 1 tablet (75 mg total) by mouth daily. 90 tablet 1   metFORMIN (GLUCOPHAGE) 1000 MG tablet Take 500 mg by mouth daily with breakfast.     Multiple Vitamin (MULTI-VITAMINS) TABS Take 1 tablet by mouth 2 (two) times daily.      pioglitazone (ACTOS) 15 MG tablet Take 15 mg by mouth daily.     SIMBRINZA 1-0.2 % SUSP Place 1 drop into both eyes 3 (three) times daily.      tamsulosin (FLOMAX) 0.4 MG CAPS capsule TAKE 1 CAPSULE BY MOUTH  DAILY 90 capsule 3   metoprolol tartrate (LOPRESSOR) 50 MG tablet Take 1 tablet (50 mg total) by mouth 2 (two) times daily. 180 tablet 0   No current facility-administered medications for this visit.     Family History    Family History  Problem Relation Age of Onset   Stroke Mother    Diabetes Mellitus II Father    Heart Problems Father    Heart disease Father    Diabetes  Sister    Cancer Maternal Grandfather    Stroke Paternal Grandmother    Prostate cancer Neg Hx    Bladder Cancer Neg Hx    Kidney cancer Neg Hx    He indicated that his mother is deceased. He indicated that his father is deceased. He indicated that his sister is alive. He indicated that his maternal grandmother is deceased. He indicated that his maternal grandfather is deceased. He indicated that his paternal grandmother is deceased. He indicated that his paternal grandfather is deceased. He indicated that his son is alive. He indicated that the status of his neg hx is unknown.  Social History    Social History   Socioeconomic History   Marital status: Widowed    Spouse name: Bethena Roys   Number of children: Not on file   Years of education: Not on file  Highest education level: Not on file  Occupational History   Occupation: retired  Tobacco Use   Smoking status: Former    Packs/day: 1.00    Years: 30.00    Total pack years: 30.00    Types: Cigarettes    Quit date: 07/19/1999    Years since quitting: 22.5   Smokeless tobacco: Never  Vaping Use   Vaping Use: Never used  Substance and Sexual Activity   Alcohol use: No    Alcohol/week: 0.0 standard drinks of alcohol   Drug use: No   Sexual activity: Yes    Partners: Female  Other Topics Concern   Not on file  Social History Narrative   Not on file   Social Determinants of Health   Financial Resource Strain: Low Risk  (09/24/2021)   Overall Financial Resource Strain (CARDIA)    Difficulty of Paying Living Expenses: Not hard at all  Food Insecurity: No Food Insecurity (09/24/2021)   Hunger Vital Sign    Worried About Running Out of Food in the Last Year: Never true    Ran Out of Food in the Last Year: Never true  Transportation Needs: No Transportation Needs (09/24/2021)   PRAPARE - Hydrologist (Medical): No    Lack of Transportation (Non-Medical): No  Physical Activity: Insufficiently Active  (09/24/2021)   Exercise Vital Sign    Days of Exercise per Week: 3 days    Minutes of Exercise per Session: 20 min  Stress: No Stress Concern Present (09/24/2021)   [place]    Feeling of Stress : Only a little  Social Connections: Moderately Isolated (09/24/2021)   Social Connection and Isolation Panel [NHANES]    Frequency of Communication with Friends and Family: Twice a week    Frequency of Social Gatherings with Friends and Family: Twice a week    Attends Religious Services: 1 to 4 times per year    Active Member of [employer] or Organizations: Not on file    Attends Archivist Meetings: Never    Marital Status: Widowed  Intimate Partner Violence: Not At Risk (09/24/2021)   Humiliation, Afraid, Rape, and Kick questionnaire    Fear of Current or Ex-Partner: No    Emotionally Abused: No    Physically Abused: No    Sexually Abused: No     Review of Systems    General:  No chills, fever, night sweats or weight changes.  Cardiovascular:  No chest pain, dyspnea on exertion, edema, orthopnea, palpitations, paroxysmal nocturnal dyspnea. Dermatological: No rash, lesions/masses Respiratory: No cough, dyspnea Urologic: No hematuria, dysuria Abdominal:   No nausea, vomiting, diarrhea, bright red blood per rectum, melena, or hematemesis Neurologic:  No visual changes, wkns, changes in mental status. All other systems reviewed and are otherwise negative except as noted above.   Physical Exam    VS:  BP 121/73 (BP Location: Left Arm, Patient Position: Sitting, Cuff Size: Normal)   Pulse (!) 123   Ht '5\' 8"'$  (1.727 m)   Wt 144 lb 6.4 oz (65.5 kg)   SpO2 99%   BMI 21.96 kg/m  , BMI Body mass index is 21.96 kg/m.     GEN: Well nourished, well developed, in no acute distress. HEENT: normal.  Glasses on Neck: Supple, no JVD, carotid bruits, or masses. Cardiac: Irregularly irregular and tachycardic, no murmurs, rubs, or  gallops. No clubbing, cyanosis, edema.  Radials 2+/PT 2+ and equal bilaterally.  Respiratory:  Respirations regular and unlabored, clear to auscultation bilaterally. GI: Soft, nontender, nondistended, BS + x 4. MS: no deformity or atrophy. Skin: warm and dry, no rash. Neuro:  Strength and sensation are intact. Psych: Normal affect.  Accessory Clinical Findings    ECG personally reviewed by me today-atrial fibrillation with a rate of 100 210 with PVCs or aberrantly conducted complexes noted- No acute changes  Lab Results  Component Value Date   WBC 9.8 01/14/2022   HGB 12.5 (L) 01/14/2022   HCT 38.7 (L) 01/14/2022   MCV 95.6 01/14/2022   PLT 487 (H) 01/14/2022   Lab Results  Component Value Date   CREATININE 1.47 (H) 01/14/2022   BUN 22 01/14/2022   NA 132 (L) 01/14/2022   K 4.2 01/14/2022   CL 98 01/14/2022   CO2 24 01/14/2022   Lab Results  Component Value Date   ALT 23 01/02/2022   AST 23 01/02/2022   ALKPHOS 101 01/02/2022   BILITOT 0.6 01/02/2022   Lab Results  Component Value Date   CHOL 138 07/02/2021   HDL 60 07/02/2021   LDLCALC 57 07/02/2021   TRIG 127 07/02/2021   CHOLHDL 2.3 07/02/2021    Lab Results  Component Value Date   HGBA1C 7.1 (H) 07/02/2021    Assessment & Plan   1.  Atrial fibrillation that was recently found by PCP.  Exact onset is not clear but he continues to remain asymptomatic.  Previously TSH was normal.  He was started on metoprolol 25 mg twice daily and apixaban 5 mg twice daily for CHA2DS2-VASc score of at least 5.  He has had no missed doses of his apixaban and has noted no issues with bleeding.  Rate continues to be elevated today even though he remains asymptomatic.  His metoprolol has been increased to 50 mg twice daily.  We did discuss elective cardioversion procedure today.  He was given the risk and benefits.  At this time he would like to try and increase the dose of the medication and return with the determination whether he  would like to undergo the cardioversion procedure or not.  He was also given the option of being evaluated by atrial fibrillation clinic as well.  2.  Essential hypertension with blood pressure 121/73.  He is continued on metoprolol tartrate 50 mg twice daily and irbesartan 75 mg daily.  He is also been encouraged to continue to monitor his blood pressure at home.  3.  Hyperlipidemia with an LDL of 57 which is at goal of 70 or less.  He is continued on atorvastatin 40 mg daily.  This continues to be monitored by his PCP.  4.  Aortic and coronary calcifications noted on CT without any current symptoms of angina.  No ischemic changes noted on EKG.  No further testing ordered at this time.  5.  Chronic kidney disease with a serum creatinine of 1.47 on 01/14/2022.  This continues to be monitored by his PCP.  6.  Disposition patient return to clinic to see MD/APP in 1 month or sooner if needed.  At that time he will determine if he would like to undergo elective cardioversion procedure.   CHA2DS2-VASc Score = 5   This indicates a 7.2% annual risk of stroke. The patient's score is based upon: CHF History: 0 HTN History: 1 Diabetes History: 1 Stroke History: 0 Vascular Disease History: 1 Age Score: 2 Gender Score: 0      Saahas Hidrogo, NP 01/31/2022, 5:00  PM

## 2022-01-31 ENCOUNTER — Ambulatory Visit: Payer: Medicare Other | Attending: Cardiology | Admitting: Cardiology

## 2022-01-31 ENCOUNTER — Encounter: Payer: Self-pay | Admitting: Cardiology

## 2022-01-31 VITALS — BP 121/73 | HR 123 | Ht 68.0 in | Wt 144.4 lb

## 2022-01-31 DIAGNOSIS — I4891 Unspecified atrial fibrillation: Secondary | ICD-10-CM

## 2022-01-31 DIAGNOSIS — E785 Hyperlipidemia, unspecified: Secondary | ICD-10-CM

## 2022-01-31 DIAGNOSIS — I10 Essential (primary) hypertension: Secondary | ICD-10-CM | POA: Diagnosis not present

## 2022-01-31 DIAGNOSIS — I700 Atherosclerosis of aorta: Secondary | ICD-10-CM | POA: Diagnosis not present

## 2022-01-31 DIAGNOSIS — N1832 Chronic kidney disease, stage 3b: Secondary | ICD-10-CM

## 2022-01-31 MED ORDER — METOPROLOL TARTRATE 50 MG PO TABS: 50.0000 mg | ORAL_TABLET | Freq: Two times a day (BID) | ORAL | 0 refills | Status: DC

## 2022-01-31 NOTE — Patient Instructions (Signed)
Medication Instructions:  INCREASE the Metoprolol Tartrate to 50 mg twice daily  *If you need a refill on your cardiac medications before your next appointment, please call your pharmacy*   Lab Work: None ordered If you have labs (blood work) drawn today and your tests are completely normal, you will receive your results only by: [place] (if you have MyChart) OR A paper copy in the mail If you have any lab test that is abnormal or we need to change your treatment, we will call you to review the results.   Testing/Procedures: None ordered   Follow-Up: At [facility], you and your health needs are our priority.  As part of our continuing mission to provide you with exceptional heart care, we have created designated Provider Care Teams.  These Care Teams include your primary Cardiologist (physician) and Advanced Practice Providers (APPs -  Physician Assistants and Nurse Practitioners) who all work together to provide you with the care you need, when you need it.  We recommend signing up for the patient portal called "MyChart".  Sign up information is provided on this After Visit Summary.  MyChart is used to connect with patients for Virtual Visits (Telemedicine).  Patients are able to view lab/test results, encounter notes, upcoming appointments, etc.  Non-urgent messages can be sent to your provider as well.   To learn more about what you can do with MyChart, go to [email or web address].    Your next appointment:   2 week(s)  The format for your next appointment:   In Person  Provider:   You may see Kathlyn Sacramento, MD or one of the following Advanced Practice Providers on your designated Care Team:   Murray Hodgkins, NP Christell Faith, PA-C Cadence Kathlen Mody, PA-C Gerrie Nordmann, NP    Electrical Cardioversion Electrical cardioversion is the delivery of a jolt of electricity to restore a normal rhythm to the heart. A rhythm that is too fast or is not regular keeps the  heart from pumping well. There are two kinds of cardioversion. Pharmacologic (chemical) cardioversion is when your health care provider gives you one or more medicines to bring back your regular heartbeat. The second way to restore regular rhythms is by sending an electrical shock to the heart. This is called electrical cardioversion. Electrical cardioversion is done as a scheduled procedure for irregular or fast heart rhythms that are not immediately life-threatening. Electrical cardioversion may also be done in an emergency for sudden life-threatening arrhythmias. Tell a health care provider about: Any allergies you have. All medicines you are taking, including vitamins, herbs, eye drops, creams, and over-the-counter medicines. Any problems you or family members have had with anesthetic medicines. Any bleeding problems you have. Any surgeries you have had, including a pacemaker, defibrillator, or other implanted device. Any medical conditions you have. Whether you are pregnant or may be pregnant. What are the risks? Your health care provider will talk with you about risks. These include: Allergic reactions to medicines. Irritation to the skin on your chest or back where the electrodes were applied during electrical cardioversion. A blood clot that breaks free and travels to other parts of your body, such as your brain. The possible return of a worse abnormal heart rhythm that will need to be treated with medicines, a pacemaker, or an implantable cardioverter defibrillator (ICD). What happens before the procedure? Medicines Your health care provider may have you start taking: Blood-thinning medicines (anticoagulants) so your blood does not clot as easily. If your doctor gives you  this medicine, you may need to take it for 3 to 4 weeks before the procedure. Medicines to help stabilize your heart rate and rhythm. Ask your health care provider about: Changing or stopping your regular medicines.  These include any diabetes medicines or blood thinners you take. Taking medicines such as aspirin and ibuprofen. These medicines can thin your blood. Do not take them unless your health care provider tells you to. Taking over-the-counter medicines, vitamins, herbs, and supplements. General instructions Follow instructions from your health care provider about what you may eat and drink. Do not put any lotions, powders, or ointments on your chest and back for 24 hours before the procedure. They can cause problems with the paddles used to deliver electricity to your heart. Do not wear jewelry as this can interfere with delivering electricity to your heart. If you will be going home right after the procedure, plan to have a responsible adult: Take you home from the hospital or clinic. You will not be allowed to drive. Care for you for the time you are told. Tests You may have an exam or testing. A transesophageal echocardiogram (TEE) may be performed if necessary (or preferred) to rule out left atrial blood clot. What happens during the procedure?     An IV will be inserted into one of your veins. Sticky patches (electrodes) or metal paddles may be placed on your chest. The electrodes or paddles will be placed in one of these ways: One on your right chest, the other on the left ribs. One may be placed on your chest and the other on your back. Or both can be placed on your chest. You may be given a sedative. This helps you relax. An electrical shock will be delivered. The shock briefly stops (resets) your heart rhythm. Your health care provider will check to see if your heartbeat is regular. Some people need only one shock. Some need more to restore a regular heartbeat. The procedure may vary among health care providers and hospitals. What happens after the procedure? Your blood pressure, heart rate, breathing rate, and blood oxygen level will be monitored until you leave the hospital or  clinic. Your heart rhythm will be watched to make sure it does not change. Summary Electrical cardioversion is the delivery of a jolt of electricity to restore a normal rhythm to the heart. This procedure may be done as a scheduled procedure if the condition is not an emergency. You may have irritation to the skin on your chest or back where the electrodes were applied during the procedure. Your health care provider will check to see if your heartbeat is regular. Some people need only one shock. Some need more to restore a regular heartbeat. Your blood pressure, heart rate, breathing rate, and blood oxygen level will be monitored until you leave the hospital or clinic. This information is not intended to replace advice given to you by your health care provider. Make sure you discuss any questions you have with your health care provider. Document Revised: 05/28/2021 Document Reviewed: 05/30/2021 Elsevier Patient Education  [place].

## 2022-02-05 ENCOUNTER — Other Ambulatory Visit: Payer: Self-pay | Admitting: Nurse Practitioner

## 2022-02-05 DIAGNOSIS — I10 Essential (primary) hypertension: Secondary | ICD-10-CM

## 2022-02-05 NOTE — Telephone Encounter (Signed)
Requested medication (s) are due for refill today: last refill 01/02/22 #90  Requested medication (s) are on the active medication list: yes   Last refill:  01/02/22 #90 1 refills  Future visit scheduled: yes in 4 months   Notes to clinic:  To pharmacy: Please send a replace/new response with 100-Day Supply if appropriate to maximize member benefit. Requesting 1 year supply.   Do you want to refill Rx?     Requested Prescriptions  Pending Prescriptions Disp Refills   irbesartan (AVAPRO) 150 MG tablet [Pharmacy Med Name: Irbesartan 150 MG Oral Tablet] 100 tablet 2    Sig: TAKE 1 TABLET BY MOUTH DAILY     Cardiovascular:  Angiotensin Receptor Blockers Failed - 02/05/2022  6:24 AM      Failed - Cr in normal range and within 180 days    Creat  Date Value Ref Range Status  07/02/2021 1.66 (H) 0.70 - 1.28 mg/dL Final   Creatinine, Ser  Date Value Ref Range Status  01/14/2022 1.47 (H) 0.61 - 1.24 mg/dL Final   Creatinine, Urine  Date Value Ref Range Status  07/17/2017 123 20 - 320 mg/dL Final         Passed - K in normal range and within 180 days    Potassium  Date Value Ref Range Status  01/14/2022 4.2 3.5 - 5.1 mmol/L Final         Passed - Patient is not pregnant      Passed - Last BP in normal range    BP Readings from Last 1 Encounters:  01/31/22 121/73         Passed - Valid encounter within last 6 months    Recent Outpatient Visits           1 month ago Mixed hyperlipidemia   [facility] Bo Merino, FNP   7 months ago Type 2 diabetes mellitus with hyperglycemia, with long-term current use of insulin [facility])   [facility] Bo Merino, FNP   10 months ago Essential hypertension, benign   [facility] Bo Merino, FNP   1 year ago Essential hypertension, benign   [facility] Bo Merino, FNP   1 year ago Chronic kidney disease, stage 3b [facility])   [place] Kathrine Haddock, NP       Future Appointments             In 1 week Gerrie Nordmann, NP [place]. [place]   In 3 weeks Stoioff, Ronda Fairly, MD [place]   In 4 months Reece Packer, Myna Hidalgo, [facility], [place]   In 7 months Reece Packer, Myna Hidalgo, [facility], [facility]

## 2022-02-09 ENCOUNTER — Other Ambulatory Visit: Payer: Self-pay | Admitting: Nurse Practitioner

## 2022-02-12 NOTE — H&P (View-Only) (Signed)
Cardiology Clinic Note   Patient Name: Garrett Yoder. Date of Encounter: 02/14/2022  Primary Care Provider:  Bo Merino, FNP Primary Cardiologist:  Kathlyn Sacramento, MD  Patient Profile    Garrett Yoder. is a 76 y.o. male with a past medical history of PAF on anticoagulation, coronary calcification, aortic atherosclerosis, hypertension, hyperlipidemia, CKD stage IIIb, T2DM who presents to the clinic today for 2-week follow-up of afib.   Past Medical History    Past Medical History:  Diagnosis Date   Anemia 08/14/2015   Anxiety    Aortic atherosclerosis ([place]) 11/25/2016   Chest CT Oct 2018   Aorto-iliac atherosclerosis ([place]) 11/25/2016   Abd/pelvic CT Oct 2018   Coronary artery calcification seen on CAT scan 11/25/2016   Chest CT Oct 2018; refer to Garrett Yoder   DDD (degenerative disc disease), cervical 08/21/2016   Depression    Diabetes mellitus without complication ([place])    Facet arthropathy, cervical 08/21/2016   GERD (gastroesophageal reflux disease)    Heart murmur    As child   Hyperlipidemia    Hypertension    Hypertrophy of prostate with urinary obstruction 11/25/2016   Pelvic CT October 2018   Incidental lung nodule, > 71m and < 869m10/10/2016   5 mm nodule LLL, chest CT Oct 2018   Thrombocytosis 08/14/2015   Past Surgical History:  Procedure Laterality Date   ANTERIOR CERVICAL DECOMP/DISCECTOMY FUSION N/A 09/24/2016   Procedure: ANTERIOR CERVICAL DECOMPRESSION/DISCECTOMY FUSION 3 LEVELS-C4-7;  Surgeon: Garrett Yoder;  Location: [facility] ORS;  Service: Neurosurgery;  Laterality: N/A;   CATARACT EXTRACTION     ELECTROMAGNETIC NAVIGATION BROCHOSCOPY Right 04/07/2018   Procedure: ELECTROMAGNETIC NAVIGATION BRONCHOSCOPY;  Surgeon: Garrett Yoder;  Location: [facility] ORS;  Service: Cardiopulmonary;  Laterality: Right;   EYE SURGERY Right    Cataract Extraction with IOL   RETINAL LASER PROCEDURE      Allergies  Allergies  Allergen Reactions   Codeine  Nausea Only    Other reaction(s): makes him sick to his stomach Other reaction(s): makes him sick to his stomach    History of Present Illness    JaBufford Spikeshas a past medical history of: PAF. Echo 01/30/2022: EF 50-55%. Mildly dilated right atrium. Mild MR. Moderate TR. Aortic valve sclerosis/calcification without stenosis.  Coronary artery/aortic calcification.  CTA head and neck 08/28/2016: Mild calcific atherosclerosis of aortic arch, carotid and vertebral systems.  Hypertension.  Hyperlipidemia.  Lipid panel 07/02/2021: LDL 57, HDL 60, TG 127, total 138.  CKD, stage IIIb. T2DM.   Garrett Yoder first evaluated by Dr. ArFletcher Anonn 02/02/2017 for aortic atherosclerosis at the request of Garrett Yoder test was ordered secondary to patient complaint of mild exertional dyspnea and risk factors, which was a normal, low risk study. He was not seen again until 01/02/2022 when he was referred by PCP, Garrett Yoder for evaluation of asymptomatic new onset afib. EKG showed afib with RVR, HR 123. Patient was started on Eliquis and metoprolol. Irbesartan was decreased.  Echo December 2023 as above. Office follow-up with ShGerrie Yoder on 01/31/2022 showed poor rate control per EKG (afib with RVR, HR 123). Metoprolol was increased and patient requested returning to office for re-evaluation and to discuss cardioversion.   Today, patient reports he has been doing great since last visit. He is no longer experiencing fatigue and has resumed walking his dog 1 mile 1-2 times a day. Patient denies shortness of breath or dyspnea  on exertion. No chest pain, pressure, or tightness. Denies lower extremity edema, orthopnea, or PND. No palpitations. Discussed EKG today. Patient remains in afib with RVR, ventricular rate 139.    Home Medications    Current Meds  Medication Sig   acetaminophen (TYLENOL) 500 MG tablet Take 500 mg by mouth 2 (two) times daily as needed.   apixaban (ELIQUIS) 5 MG TABS  tablet Take 1 tablet (5 mg total) by mouth 2 (two) times daily.   atorvastatin (LIPITOR) 40 MG tablet TAKE 1 TABLET BY MOUTH DAILY   bimatoprost (LUMIGAN) 0.01 % SOLN Place 1 drop into both eyes at bedtime.    finasteride (PROSCAR) 5 MG tablet TAKE 1 TABLET BY MOUTH DAILY   insulin detemir (LEVEMIR FLEXTOUCH) 100 UNIT/ML FlexPen Inject 30 Units into the skin every evening.   insulin lispro (HUMALOG) 100 UNIT/ML KwikPen Inject into the skin daily. Pt taking 4 units in AM, 12 units at noon   irbesartan (AVAPRO) 75 MG tablet Take 1 tablet (75 mg total) by mouth daily.   metFORMIN (GLUCOPHAGE) 1000 MG tablet Take 500 mg by mouth daily with breakfast.   metoprolol tartrate (LOPRESSOR) 25 MG tablet Take 2 tablets by mouth 2 (two) times daily.   Multiple Vitamin (MULTI-VITAMINS) TABS Take 1 tablet by mouth 2 (two) times daily.    pioglitazone (ACTOS) 15 MG tablet Take 15 mg by mouth daily.   SIMBRINZA 1-0.2 % SUSP Place 1 drop into both eyes 3 (three) times daily.    tamsulosin (FLOMAX) 0.4 MG CAPS capsule TAKE 1 CAPSULE BY MOUTH  DAILY    Family History    Family History  Problem Relation Age of Onset   Stroke Mother    Diabetes Mellitus II Father    Heart Problems Father    Heart disease Father    Diabetes Sister    Cancer Maternal Grandfather    Stroke Paternal Grandmother    Prostate cancer Neg Hx    Bladder Cancer Neg Hx    Kidney cancer Neg Hx    He indicated that his mother is deceased. He indicated that his father is deceased. He indicated that his sister is alive. He indicated that his maternal grandmother is deceased. He indicated that his maternal grandfather is deceased. He indicated that his paternal grandmother is deceased. He indicated that his paternal grandfather is deceased. He indicated that his son is alive. He indicated that the status of his neg hx is unknown.   Social History    Social History   Socioeconomic History   Marital status: Widowed    Spouse name:  Bethena Roys   Number of children: Not on file   Years of education: Not on file   Highest education level: Not on file  Occupational History   Occupation: retired  Tobacco Use   Smoking status: Former    Packs/day: 1.00    Years: 30.00    Total pack years: 30.00    Types: Cigarettes    Quit date: 07/19/1999    Years since quitting: 22.5   Smokeless tobacco: Never  Vaping Use   Vaping Use: Never used  Substance and Sexual Activity   Alcohol use: No    Alcohol/week: 0.0 standard drinks of alcohol   Drug use: No   Sexual activity: Yes    Partners: Female  Other Topics Concern   Not on file  Social History Narrative   Not on file   Social Determinants of Health   Financial Resource Strain: Low  Risk  (09/24/2021)   Overall Financial Resource Strain (CARDIA)    Difficulty of Paying Living Expenses: Not hard at all  Food Insecurity: No Food Insecurity (09/24/2021)   Hunger Vital Sign    Worried About Running Out of Food in the Last Year: Never true    Ran Out of Food in the Last Year: Never true  Transportation Needs: No Transportation Needs (09/24/2021)   PRAPARE - Hydrologist (Medical): No    Lack of Transportation (Non-Medical): No  Physical Activity: Insufficiently Active (09/24/2021)   Exercise Vital Sign    Days of Exercise per Week: 3 days    Minutes of Exercise per Session: 20 min  Stress: No Stress Concern Present (09/24/2021)   [place]    Feeling of Stress : Only a little  Social Connections: Moderately Isolated (09/24/2021)   Social Connection and Isolation Panel [NHANES]    Frequency of Communication with Friends and Family: Twice a week    Frequency of Social Gatherings with Friends and Family: Twice a week    Attends Religious Services: 1 to 4 times per year    Active Member of [employer] or Organizations: Not on file    Attends Archivist Meetings: Never    Marital Status:  Widowed  Intimate Partner Violence: Not At Risk (09/24/2021)   Humiliation, Afraid, Rape, and Kick questionnaire    Fear of Current or Ex-Partner: No    Emotionally Abused: No    Physically Abused: No    Sexually Abused: No     Review of Systems    General:  No chills, fever, night sweats or weight changes.  Cardiovascular:  No chest pain, dyspnea on exertion, edema, orthopnea, palpitations, paroxysmal nocturnal dyspnea. Dermatological: No rash, lesions/masses Respiratory: No cough, dyspnea Urologic: No hematuria, dysuria Abdominal:   No nausea, vomiting, diarrhea, bright red blood per rectum, melena, or hematemesis Neurologic:  No visual changes, weakness, changes in mental status. All other systems reviewed and are otherwise negative except as noted above.  Physical Exam    VS:  BP 106/62 (BP Location: Left Arm, Patient Position: Sitting, Cuff Size: Normal)   Pulse (!) 139   Ht '5\' 8"'$  (1.727 m)   Wt 145 lb (65.8 kg)   SpO2 99%   BMI 22.05 kg/m  , BMI Body mass index is 22.05 kg/m. GEN:  Well nourished, well developed, in no acute distress. HEENT: Normal. Neck: Supple, no JVD, carotid bruits, or masses. Cardiac: Irregular rhythm, no murmurs, rubs, or gallops. No clubbing, cyanosis, edema.  Radials/DP/PT 2+ and equal bilaterally.  Respiratory:  Respirations regular and unlabored, clear to auscultation bilaterally. GI: Soft, nontender, nondistended. MS: No deformity or atrophy. Skin: Warm and dry, no rash. Neuro: Strength and sensation are intact. Psych: Normal affect.  Accessory Clinical Findings   Recent Labs: 01/02/2022: ALT 23; TSH 2.829 01/14/2022: BUN 22; Creatinine, Ser 1.47; Hemoglobin 12.5; Platelets 487; Potassium 4.2; Sodium 132   Recent Lipid Panel    Component Value Date/Time   CHOL 138 07/02/2021 1350   CHOL 155 07/24/2016 0829   TRIG 127 07/02/2021 1350   HDL 60 07/02/2021 1350   HDL 62 07/24/2016 0829   CHOLHDL 2.3 07/02/2021 1350   VLDL 22  08/14/2015 1532   LDLCALC 57 07/02/2021 1350         ECG personally reviewed by me today: Atrial fibrillation, ventricular rate 139.  No significant changes from 01/31/2022  CHA2DS2-VASc Score = 5   This indicates a 7.2% annual risk of stroke. The patient's score is based upon: CHF History: 0 HTN History: 1 Diabetes History: 1 Stroke History: 0 Vascular Disease History: 1 Age Score: 2 Gender Score: 0      Assessment & Plan   PAF. Seen on EKG from PCP. Onset is unknown secondary to patient being asymptomatic. He reports feeling good since increasing metoprolol. He is no longer experiencing fatigue and is able to walk his dog 1 mile 1-2 times a day. He denies chest pain, shortness of breath, DOE, lower extremity edema or palpitations. EKG today shows afib with RVR, rate 139. He has not missed any doses of Eliquis and has been taking it 4+ weeks. CBC, BMP and MG today.   Shared Decision Making/Informed Consent The risks (stroke, cardiac arrhythmias rarely resulting in the need for a temporary or permanent pacemaker, skin irritation or burns and complications associated with conscious sedation including aspiration, arrhythmia, respiratory failure and death), benefits (restoration of normal sinus rhythm) and alternatives of a direct current cardioversion were explained in detail to Mr. Doyel and he agrees to proceed.   Coronary artery/aortic calcification. Noted on CT in 2018. He denies chest pain or DOE. Continue metoprolol, atorvastatin and Eliquis.  Hypertension. BP today 106/62. Patient denies dizziness or headaches. Continue irbesartan and metoprolol.  Hyperlipidemia. LDL May 2023 57, at goal. Continue Lipitor.       Disposition: CBC, BMP, MG today. Cardioversion scheduled. Follow-up after procedure.    Justice Britain. Judithe Keetch, DNP, NP-C     02/14/2022, 2:28 PM [place] [address] Office [phone] Fax [phone]

## 2022-02-12 NOTE — Telephone Encounter (Signed)
Unable to refill per protocol, request is too soon to be refilled. Last refill 05/01/21 for 90 days and 3 refills. Will refuse.  Requested Prescriptions  Pending Prescriptions Disp Refills   tamsulosin (FLOMAX) 0.4 MG CAPS capsule [Pharmacy Med Name: Tamsulosin HCl 0.4 MG Oral Capsule] 100 capsule 2    Sig: TAKE 1 CAPSULE BY MOUTH DAILY     Urology: Alpha-Adrenergic Blocker Failed - 02/09/2022  5:57 AM      Failed - PSA in normal range and within 360 days    Prostate Specific Ag, Serum  Date Value Ref Range Status  11/14/2016 2.6 0.0 - 4.0 ng/mL Final    Comment:    Roche ECLIA methodology. According to the American Urological Association, Serum PSA should decrease and remain at undetectable levels after radical prostatectomy. The AUA defines biochemical recurrence as an initial PSA value 0.2 ng/mL or greater followed by a subsequent confirmatory PSA value 0.2 ng/mL or greater. Values obtained with different assay methods or kits cannot be used interchangeably. Results cannot be interpreted as absolute evidence of the presence or absence of malignant disease.          Passed - Last BP in normal range    BP Readings from Last 1 Encounters:  01/31/22 121/73         Passed - Valid encounter within last 12 months    Recent Outpatient Visits           1 month ago Mixed hyperlipidemia   [place], FNP   7 months ago Type 2 diabetes mellitus with hyperglycemia, with long-term current use of insulin [facility])   [facility] Bo Merino, FNP   10 months ago Essential hypertension, benign   [facility] Bo Merino, FNP   1 year ago Essential hypertension, benign   [facility] Bo Merino, FNP   1 year ago Chronic kidney disease, stage 3b [facility])   [facility] Kathrine Haddock, NP       Future Appointments             In 2 days Gerrie Nordmann, NP [place]. [place]   In 2 weeks Stoioff, Ronda Fairly, MD [facility]   In 4 months Reece Packer, Myna Hidalgo, [facility], [place]   In 7 months Reece Packer, Myna Hidalgo, [facility], [facility]

## 2022-02-12 NOTE — Progress Notes (Signed)
Cardiology Clinic Note   Patient Name: Garrett Yoder. Date of Encounter: 02/14/2022  Primary Care Provider:  Bo Merino, FNP Primary Cardiologist:  Kathlyn Sacramento, MD  Patient Profile    Garrett Yoder. is a 76 y.o. male with a past medical history of PAF on anticoagulation, coronary calcification, aortic atherosclerosis, hypertension, hyperlipidemia, CKD stage IIIb, T2DM who presents to the clinic today for 2-week follow-up of afib.   Past Medical History    Past Medical History:  Diagnosis Date   Anemia 08/14/2015   Anxiety    Aortic atherosclerosis ([place]) 11/25/2016   Chest CT Oct 2018   Aorto-iliac atherosclerosis ([place]) 11/25/2016   Abd/pelvic CT Oct 2018   Coronary artery calcification seen on CAT scan 11/25/2016   Chest CT Oct 2018; refer to Dr. Fletcher Anon   DDD (degenerative disc disease), cervical 08/21/2016   Depression    Diabetes mellitus without complication ([place])    Facet arthropathy, cervical 08/21/2016   GERD (gastroesophageal reflux disease)    Heart murmur    As child   Hyperlipidemia    Hypertension    Hypertrophy of prostate with urinary obstruction 11/25/2016   Pelvic CT October 2018   Incidental lung nodule, > 1m and < 838m10/10/2016   5 mm nodule LLL, chest CT Oct 2018   Thrombocytosis 08/14/2015   Past Surgical History:  Procedure Laterality Date   ANTERIOR CERVICAL DECOMP/DISCECTOMY FUSION N/A 09/24/2016   Procedure: ANTERIOR CERVICAL DECOMPRESSION/DISCECTOMY FUSION 3 LEVELS-C4-7;  Surgeon: YaMeade MawMD;  Location: [facility] ORS;  Service: Neurosurgery;  Laterality: N/A;   CATARACT EXTRACTION     ELECTROMAGNETIC NAVIGATION BROCHOSCOPY Right 04/07/2018   Procedure: ELECTROMAGNETIC NAVIGATION BRONCHOSCOPY;  Surgeon: KaFlora LippsMD;  Location: [facility] ORS;  Service: Cardiopulmonary;  Laterality: Right;   EYE SURGERY Right    Cataract Extraction with IOL   RETINAL LASER PROCEDURE      Allergies  Allergies  Allergen Reactions   Codeine  Nausea Only    Other reaction(s): makes him sick to his stomach Other reaction(s): makes him sick to his stomach    History of Present Illness    JaBufford Spikeshas a past medical history of: PAF. Echo 01/30/2022: EF 50-55%. Mildly dilated right atrium. Mild MR. Moderate TR. Aortic valve sclerosis/calcification without stenosis.  Coronary artery/aortic calcification.  CTA head and neck 08/28/2016: Mild calcific atherosclerosis of aortic arch, carotid and vertebral systems.  Hypertension.  Hyperlipidemia.  Lipid panel 07/02/2021: LDL 57, HDL 60, TG 127, total 138.  CKD, stage IIIb. T2DM.   Garrett Yoder first evaluated by Dr. ArFletcher Anonn 02/02/2017 for aortic atherosclerosis at the request of Dr. LaSanda KleinStress test was ordered secondary to patient complaint of mild exertional dyspnea and risk factors, which was a normal, low risk study. He was not seen again until 01/02/2022 when he was referred by PCP, JuSerafina RoyalsFNP for evaluation of asymptomatic new onset afib. EKG showed afib with RVR, HR 123. Patient was started on Eliquis and metoprolol. Irbesartan was decreased.  Echo December 2023 as above. Office follow-up with ShGerrie NordmannNP on 01/31/2022 showed poor rate control per EKG (afib with RVR, HR 123). Metoprolol was increased and patient requested returning to office for re-evaluation and to discuss cardioversion.   Today, patient reports he has been doing great since last visit. He is no longer experiencing fatigue and has resumed walking his dog 1 mile 1-2 times a day. Patient denies shortness of breath or dyspnea  on exertion. No chest pain, pressure, or tightness. Denies lower extremity edema, orthopnea, or PND. No palpitations. Discussed EKG today. Patient remains in afib with RVR, ventricular rate 139.    Home Medications    Current Meds  Medication Sig   acetaminophen (TYLENOL) 500 MG tablet Take 500 mg by mouth 2 (two) times daily as needed.   apixaban (ELIQUIS) 5 MG TABS  tablet Take 1 tablet (5 mg total) by mouth 2 (two) times daily.   atorvastatin (LIPITOR) 40 MG tablet TAKE 1 TABLET BY MOUTH DAILY   bimatoprost (LUMIGAN) 0.01 % SOLN Place 1 drop into both eyes at bedtime.    finasteride (PROSCAR) 5 MG tablet TAKE 1 TABLET BY MOUTH DAILY   insulin detemir (LEVEMIR FLEXTOUCH) 100 UNIT/ML FlexPen Inject 30 Units into the skin every evening.   insulin lispro (HUMALOG) 100 UNIT/ML KwikPen Inject into the skin daily. Pt taking 4 units in AM, 12 units at noon   irbesartan (AVAPRO) 75 MG tablet Take 1 tablet (75 mg total) by mouth daily.   metFORMIN (GLUCOPHAGE) 1000 MG tablet Take 500 mg by mouth daily with breakfast.   metoprolol tartrate (LOPRESSOR) 25 MG tablet Take 2 tablets by mouth 2 (two) times daily.   Multiple Vitamin (MULTI-VITAMINS) TABS Take 1 tablet by mouth 2 (two) times daily.    pioglitazone (ACTOS) 15 MG tablet Take 15 mg by mouth daily.   SIMBRINZA 1-0.2 % SUSP Place 1 drop into both eyes 3 (three) times daily.    tamsulosin (FLOMAX) 0.4 MG CAPS capsule TAKE 1 CAPSULE BY MOUTH  DAILY    Family History    Family History  Problem Relation Age of Onset   Stroke Mother    Diabetes Mellitus II Father    Heart Problems Father    Heart disease Father    Diabetes Sister    Cancer Maternal Grandfather    Stroke Paternal Grandmother    Prostate cancer Neg Hx    Bladder Cancer Neg Hx    Kidney cancer Neg Hx    He indicated that his mother is deceased. He indicated that his father is deceased. He indicated that his sister is alive. He indicated that his maternal grandmother is deceased. He indicated that his maternal grandfather is deceased. He indicated that his paternal grandmother is deceased. He indicated that his paternal grandfather is deceased. He indicated that his son is alive. He indicated that the status of his neg hx is unknown.   Social History    Social History   Socioeconomic History   Marital status: Widowed    Spouse name:  Garrett Yoder   Number of children: Not on file   Years of education: Not on file   Highest education level: Not on file  Occupational History   Occupation: retired  Tobacco Use   Smoking status: Former    Packs/day: 1.00    Years: 30.00    Total pack years: 30.00    Types: Cigarettes    Quit date: 07/19/1999    Years since quitting: 22.5   Smokeless tobacco: Never  Vaping Use   Vaping Use: Never used  Substance and Sexual Activity   Alcohol use: No    Alcohol/week: 0.0 standard drinks of alcohol   Drug use: No   Sexual activity: Yes    Partners: Female  Other Topics Concern   Not on file  Social History Narrative   Not on file   Social Determinants of Health   Financial Resource Strain: Low  Risk  (09/24/2021)   Overall Financial Resource Strain (CARDIA)    Difficulty of Paying Living Expenses: Not hard at all  Food Insecurity: No Food Insecurity (09/24/2021)   Hunger Vital Sign    Worried About Running Out of Food in the Last Year: Never true    Ran Out of Food in the Last Year: Never true  Transportation Needs: No Transportation Needs (09/24/2021)   PRAPARE - Hydrologist (Medical): No    Lack of Transportation (Non-Medical): No  Physical Activity: Insufficiently Active (09/24/2021)   Exercise Vital Sign    Days of Exercise per Week: 3 days    Minutes of Exercise per Session: 20 min  Stress: No Stress Concern Present (09/24/2021)   [place]    Feeling of Stress : Only a little  Social Connections: Moderately Isolated (09/24/2021)   Social Connection and Isolation Panel [NHANES]    Frequency of Communication with Friends and Family: Twice a week    Frequency of Social Gatherings with Friends and Family: Twice a week    Attends Religious Services: 1 to 4 times per year    Active Member of [employer] or Organizations: Not on file    Attends Archivist Meetings: Never    Marital Status:  Widowed  Intimate Partner Violence: Not At Risk (09/24/2021)   Humiliation, Afraid, Rape, and Kick questionnaire    Fear of Current or Ex-Partner: No    Emotionally Abused: No    Physically Abused: No    Sexually Abused: No     Review of Systems    General:  No chills, fever, night sweats or weight changes.  Cardiovascular:  No chest pain, dyspnea on exertion, edema, orthopnea, palpitations, paroxysmal nocturnal dyspnea. Dermatological: No rash, lesions/masses Respiratory: No cough, dyspnea Urologic: No hematuria, dysuria Abdominal:   No nausea, vomiting, diarrhea, bright red blood per rectum, melena, or hematemesis Neurologic:  No visual changes, weakness, changes in mental status. All other systems reviewed and are otherwise negative except as noted above.  Physical Exam    VS:  BP 106/62 (BP Location: Left Arm, Patient Position: Sitting, Cuff Size: Normal)   Pulse (!) 139   Ht '5\' 8"'$  (1.727 m)   Wt 145 lb (65.8 kg)   SpO2 99%   BMI 22.05 kg/m  , BMI Body mass index is 22.05 kg/m. GEN:  Well nourished, well developed, in no acute distress. HEENT: Normal. Neck: Supple, no JVD, carotid bruits, or masses. Cardiac: Irregular rhythm, no murmurs, rubs, or gallops. No clubbing, cyanosis, edema.  Radials/DP/PT 2+ and equal bilaterally.  Respiratory:  Respirations regular and unlabored, clear to auscultation bilaterally. GI: Soft, nontender, nondistended. MS: No deformity or atrophy. Skin: Warm and dry, no rash. Neuro: Strength and sensation are intact. Psych: Normal affect.  Accessory Clinical Findings   Recent Labs: 01/02/2022: ALT 23; TSH 2.829 01/14/2022: BUN 22; Creatinine, Ser 1.47; Hemoglobin 12.5; Platelets 487; Potassium 4.2; Sodium 132   Recent Lipid Panel    Component Value Date/Time   CHOL 138 07/02/2021 1350   CHOL 155 07/24/2016 0829   TRIG 127 07/02/2021 1350   HDL 60 07/02/2021 1350   HDL 62 07/24/2016 0829   CHOLHDL 2.3 07/02/2021 1350   VLDL 22  08/14/2015 1532   LDLCALC 57 07/02/2021 1350         ECG personally reviewed by me today: Atrial fibrillation, ventricular rate 139.  No significant changes from 01/31/2022  CHA2DS2-VASc Score = 5   This indicates a 7.2% annual risk of stroke. The patient's score is based upon: CHF History: 0 HTN History: 1 Diabetes History: 1 Stroke History: 0 Vascular Disease History: 1 Age Score: 2 Gender Score: 0      Assessment & Plan   PAF. Seen on EKG from PCP. Onset is unknown secondary to patient being asymptomatic. He reports feeling good since increasing metoprolol. He is no longer experiencing fatigue and is able to walk his dog 1 mile 1-2 times a day. He denies chest pain, shortness of breath, DOE, lower extremity edema or palpitations. EKG today shows afib with RVR, rate 139. He has not missed any doses of Eliquis and has been taking it 4+ weeks. CBC, BMP and MG today.   Shared Decision Making/Informed Consent The risks (stroke, cardiac arrhythmias rarely resulting in the need for a temporary or permanent pacemaker, skin irritation or burns and complications associated with conscious sedation including aspiration, arrhythmia, respiratory failure and death), benefits (restoration of normal sinus rhythm) and alternatives of a direct current cardioversion were explained in detail to Garrett Yoder and he agrees to proceed.   Coronary artery/aortic calcification. Noted on CT in 2018. He denies chest pain or DOE. Continue metoprolol, atorvastatin and Eliquis.  Hypertension. BP today 106/62. Patient denies dizziness or headaches. Continue irbesartan and metoprolol.  Hyperlipidemia. LDL May 2023 57, at goal. Continue Lipitor.       Disposition: CBC, BMP, MG today. Cardioversion scheduled. Follow-up after procedure.    Justice Britain. Giannina Bartolome, DNP, NP-C     02/14/2022, 2:28 PM [place] [address] Office [phone] Fax [phone]

## 2022-02-14 ENCOUNTER — Ambulatory Visit: Payer: Medicare Other | Attending: Cardiology | Admitting: Student

## 2022-02-14 ENCOUNTER — Encounter: Payer: Self-pay | Admitting: Cardiology

## 2022-02-14 VITALS — BP 106/62 | HR 139 | Ht 68.0 in | Wt 145.0 lb

## 2022-02-14 DIAGNOSIS — I10 Essential (primary) hypertension: Secondary | ICD-10-CM

## 2022-02-14 DIAGNOSIS — I4891 Unspecified atrial fibrillation: Secondary | ICD-10-CM

## 2022-02-14 NOTE — Patient Instructions (Signed)
Medication Instructions:  No changes *If you need a refill on your cardiac medications before your next appointment, please call your pharmacy*   Follow-Up: At [facility], you and your health needs are our priority.  As part of our continuing mission to provide you with exceptional heart care, we have created designated Provider Care Teams.  These Care Teams include your primary Cardiologist (physician) and Advanced Practice Providers (APPs -  Physician Assistants and Nurse Practitioners) who all work together to provide you with the care you need, when you need it.  We recommend signing up for the patient portal called "MyChart".  Sign up information is provided on this After Visit Summary.  MyChart is used to connect with patients for Virtual Visits (Telemedicine).  Patients are able to view lab/test results, encounter notes, upcoming appointments, etc.  Non-urgent messages can be sent to your provider as well.   To learn more about what you can do with MyChart, go to [email or web address].    Your next appointment:   4 week(s)  The format for your next appointment:   In Person  Provider:   You may see Kathlyn Sacramento, MD or one of the following Advanced Practice Providers on your designated Care Team:   Murray Hodgkins, NP Christell Faith, PA-C Cadence Kathlen Mody, [place] Gerrie Nordmann, NP    Other Instructions    You are scheduled for a Cardioversion on 02/27/2022 with Dr. Rockey Situ.  Please arrive at the [facility] [place], [place] at 6:30 am.  After entering the [place] please check-in at the registration desk (1st desk on your right) to receive your armband. (1 hour prior to procedure unless lab work is needed; if lab work is needed arrive 1.5 hours ahead)  DIET: Nothing to eat or drink after midnight except a sip of water with medications (see medication instructions below)  Medication Instructions: Hold all diabetic medication the morning of  the procedure Continue your anticoagulant: Eliquis If you miss a dose, please call us at [phone] You will need to continue your anticoagulant after your procedure until you are told by your provider that it is safe to stop.   Labs:  Your provider would like for you to return on January 5th to have the following labs drawn: BMET, CBC and Magnesium.   Please go to the [facility] entrance and check in at the front desk.  You do not need an appointment.  They are open from 7am-6 pm.  You will not need to be fasting.  FYI: For your safety, and to allow [place] to monitor your vital signs accurately during the surgery/procedure we request that if you have artificial nails, gel coating, SNS etc. Please have those removed prior to your surgery/procedure. Not having the nail coverings /polish removed may result in cancellation or delay of your surgery/procedure.  You must have a responsible person to drive you home and stay in the waiting area during your procedure. Failure to do so could result in cancellation.  Bring your insurance cards.  If you have any questions after you get home, please call the office at [phone]  *Special Note: Every effort is made to have your procedure done on time. Occasionally there are emergencies that occur at the hospital that may cause delays. Please be patient if a delay does occur.

## 2022-02-21 ENCOUNTER — Other Ambulatory Visit
Admission: RE | Admit: 2022-02-21 | Discharge: 2022-02-21 | Disposition: A | Discharge status: Home / Self Care | Payer: Medicare Other | Attending: Student | Admitting: Student

## 2022-02-21 DIAGNOSIS — I4891 Unspecified atrial fibrillation: Secondary | ICD-10-CM | POA: Insufficient documentation

## 2022-02-21 LAB — BASIC METABOLIC PANEL
Anion gap: 8 (ref 5–15)
BUN: 24 mg/dL — ABNORMAL HIGH (ref 8–23)
CO2: 25 mmol/L (ref 22–32)
Calcium: 9 mg/dL (ref 8.9–10.3)
Chloride: 100 mmol/L (ref 98–111)
Creatinine, Ser: 1.46 mg/dL — ABNORMAL HIGH (ref 0.61–1.24)
GFR, Estimated: 50 mL/min — ABNORMAL LOW (ref >60)
Glucose, Bld: 308 mg/dL — ABNORMAL HIGH (ref 70–99)
Potassium: 4.6 mmol/L (ref 3.5–5.1)
Sodium: 133 mmol/L — ABNORMAL LOW (ref 135–145)

## 2022-02-21 LAB — CBC
HCT: 36.9 % — ABNORMAL LOW (ref 39.0–52.0)
Hemoglobin: 11.9 g/dL — ABNORMAL LOW (ref 13.0–17.0)
MCH: 30.5 pg (ref 26.0–34.0)
MCHC: 32.2 g/dL (ref 30.0–36.0)
MCV: 94.6 fL (ref 80.0–100.0)
Platelets: 403 10*3/uL — ABNORMAL HIGH (ref 150–400)
RBC: 3.9 MIL/uL — ABNORMAL LOW (ref 4.22–5.81)
RDW: 12.8 % (ref 11.5–15.5)
WBC: 7.3 10*3/uL (ref 4.0–10.5)
nRBC: 0 % (ref 0.0–0.2)

## 2022-02-21 LAB — MAGNESIUM: Magnesium: 2.1 mg/dL (ref 1.7–2.4)

## 2022-02-26 ENCOUNTER — Ambulatory Visit: Payer: Medicare Other | Admitting: Urology

## 2022-02-26 ENCOUNTER — Other Ambulatory Visit: Payer: Self-pay | Admitting: Cardiovascular Disease

## 2022-02-27 ENCOUNTER — Ambulatory Visit
Admission: RE | Admit: 2022-02-27 | Discharge: 2022-02-27 | Disposition: A | Discharge status: Home / Self Care | Payer: Medicare Other | Attending: Cardiovascular Disease | Admitting: Cardiovascular Disease

## 2022-02-27 ENCOUNTER — Ambulatory Visit: Payer: Medicare Other | Admitting: Anesthesiology

## 2022-02-27 ENCOUNTER — Encounter
Admission: RE | Disposition: A | Discharge status: Home / Self Care | Payer: Self-pay | Source: Home / Self Care | Attending: Cardiovascular Disease

## 2022-02-27 ENCOUNTER — Encounter: Payer: Self-pay | Admitting: Cardiovascular Disease

## 2022-02-27 DIAGNOSIS — I2510 Atherosclerotic heart disease of native coronary artery without angina pectoris: Secondary | ICD-10-CM | POA: Insufficient documentation

## 2022-02-27 DIAGNOSIS — I700 Atherosclerosis of aorta: Secondary | ICD-10-CM | POA: Diagnosis not present

## 2022-02-27 DIAGNOSIS — D759 Disease of blood and blood-forming organs, unspecified: Secondary | ICD-10-CM | POA: Diagnosis not present

## 2022-02-27 DIAGNOSIS — Z79899 Other long term (current) drug therapy: Secondary | ICD-10-CM | POA: Diagnosis not present

## 2022-02-27 DIAGNOSIS — Z7901 Long term (current) use of anticoagulants: Secondary | ICD-10-CM | POA: Insufficient documentation

## 2022-02-27 DIAGNOSIS — Z87891 Personal history of nicotine dependence: Secondary | ICD-10-CM | POA: Insufficient documentation

## 2022-02-27 DIAGNOSIS — I129 Hypertensive chronic kidney disease with stage 1 through stage 4 chronic kidney disease, or unspecified chronic kidney disease: Secondary | ICD-10-CM | POA: Diagnosis not present

## 2022-02-27 DIAGNOSIS — E785 Hyperlipidemia, unspecified: Secondary | ICD-10-CM | POA: Insufficient documentation

## 2022-02-27 DIAGNOSIS — I4819 Other persistent atrial fibrillation: Secondary | ICD-10-CM | POA: Diagnosis not present

## 2022-02-27 DIAGNOSIS — I4891 Unspecified atrial fibrillation: Secondary | ICD-10-CM

## 2022-02-27 DIAGNOSIS — R0602 Shortness of breath: Secondary | ICD-10-CM

## 2022-02-27 DIAGNOSIS — D649 Anemia, unspecified: Secondary | ICD-10-CM | POA: Insufficient documentation

## 2022-02-27 DIAGNOSIS — E1122 Type 2 diabetes mellitus with diabetic chronic kidney disease: Secondary | ICD-10-CM | POA: Insufficient documentation

## 2022-02-27 DIAGNOSIS — N1832 Chronic kidney disease, stage 3b: Secondary | ICD-10-CM | POA: Diagnosis not present

## 2022-02-27 HISTORY — PX: CARDIOVERSION: SHX1299

## 2022-02-27 LAB — GLUCOSE, CAPILLARY: Glucose-Capillary: 260 mg/dL — ABNORMAL HIGH (ref 70–99)

## 2022-02-27 SURGERY — CARDIOVERSION
Anesthesia: General

## 2022-02-27 MED ORDER — PROPOFOL 10 MG/ML IV BOLUS
INTRAVENOUS | Status: AC
  Filled 2022-02-27: qty 20

## 2022-02-27 MED ORDER — SODIUM CHLORIDE 0.9 % IV SOLN: INTRAVENOUS | Status: DC

## 2022-02-27 MED ORDER — PROPOFOL 10 MG/ML IV BOLUS
INTRAVENOUS | Status: DC | PRN
  Administered 2022-02-27: 20 mg via INTRAVENOUS
  Administered 2022-02-27: 10 mg via INTRAVENOUS
  Administered 2022-02-27: 40 mg via INTRAVENOUS

## 2022-02-27 NOTE — Transfer of Care (Signed)
Immediate Anesthesia Transfer of Care Note  Patient: Garrett Yoder.  Procedure(s) Performed: CARDIOVERSION  Patient Location:  cardiology  Anesthesia Type:General  Level of Consciousness: sedated  Airway & Oxygen Therapy: Patient Spontanous Breathing and Patient connected to nasal cannula oxygen  Post-op Assessment: Report given to RN and Post -op Vital signs reviewed and stable  Post vital signs: Reviewed and stable  Last Vitals:  Vitals Value Taken Time  BP 104/64 02/27/22 0746  Temp    Pulse 60 02/27/22 0746  Resp 16 02/27/22 0746  SpO2 99 % 02/27/22 0746    Last Pain:  Vitals:   02/27/22 0647  TempSrc: Oral  PainSc: 0-No pain         Complications: No notable events documented.

## 2022-02-27 NOTE — CV Procedure (Signed)
Cardioversion procedure note For atrial fibrillation, persistent.  Procedure Details:  Consent: Risks of procedure as well as the alternatives and risks of each were explained to the (patient/caregiver).  Consent for procedure obtained.  Time Out: Verified patient identification, verified procedure, site/side was marked, verified correct patient position, special equipment/implants available, medications/allergies/relevent history reviewed, required imaging and test results available.  Performed  Patient placed on cardiac monitor, pulse oximetry, supplemental oxygen as necessary.   Sedation given: propofol IV, Dr. Wynetta Emery Pacer pads placed anterior and posterior chest.   Cardioverted 2 time(s).   Cardioverted at  150J, 200J. Synchronized biphasic Converted to NSR   Evaluation: Findings: Post procedure EKG shows: NSR Complications: None Patient did tolerate procedure well.  Time Spent Directly with the Patient:  7 minutes   Esmond Plants, M.D., Ph.D.

## 2022-02-27 NOTE — Anesthesia Preprocedure Evaluation (Addendum)
Anesthesia Evaluation  Patient identified by MRN, date of birth, ID band Patient awake    Reviewed: Allergy & Precautions, NPO status , Patient's Chart, lab work & pertinent test results, reviewed documented beta blocker date and time   Airway Mallampati: III  TM Distance: >3 FB Neck ROM: full    Dental  (+) Upper Dentures, Lower Dentures   Pulmonary former smoker   Pulmonary exam normal        Cardiovascular hypertension, Pt. on home beta blockers + dysrhythmias (PAF on anticoagulation)  Rhythm:Irregular Rate:Tachycardia - Systolic murmurs and - Peripheral Edema Aorto-iliac atherosclerosis  ECHO 01/2022: TR Moderate   Neuro/Psych  PSYCHIATRIC DISORDERS Anxiety     negative neurological ROS     GI/Hepatic Neg liver ROS,GERD  ,,  Endo/Other  diabetes, Type 2    Renal/GU Renal InsufficiencyRenal disease   Hypertrophy of prostate with urinary obstruction    Musculoskeletal  (+) Arthritis ,    Abdominal Normal abdominal exam  (+)   Peds  Hematology  (+) Blood dyscrasia, anemia   Anesthesia Other Findings Past Medical History: 08/14/2015: Anemia No date: Anxiety 11/25/2016: Aortic atherosclerosis [facility])     Comment:  Chest CT Oct 2018 11/25/2016: Aorto-iliac atherosclerosis ([place])     Comment:  Abd/pelvic CT Oct 2018 11/25/2016: Coronary artery calcification seen on CAT scan     Comment:  Chest CT Oct 2018; refer to Dr. Fletcher Anon 08/21/2016: DDD (degenerative disc disease), cervical No date: Depression No date: Diabetes mellitus without complication ([place]) 04/19/2023: Facet arthropathy, cervical No date: GERD (gastroesophageal reflux disease) No date: Heart murmur     Comment:  As child No date: Hyperlipidemia No date: Hypertension 11/25/2016: Hypertrophy of prostate with urinary obstruction     Comment:  Pelvic CT October 2018 11/25/2016: Incidental lung nodule, > 70m and < 824m    Comment:  5 mm nodule LLL, chest CT Oct  2018 08/14/2015: Thrombocytosis  Past Surgical History: 09/24/2016: ANTERIOR CERVICAL DECOMP/DISCECTOMY FUSION; N/A     Comment:  Procedure: ANTERIOR CERVICAL DECOMPRESSION/DISCECTOMY               FUSION 3 LEVELS-C4-7;  Surgeon: YaMeade MawMD;                Location: [facility] ORS;  Service: Neurosurgery;  Laterality:               N/A; No date: CATARACT EXTRACTION 04/07/2018: ELECTROMAGNETIC NAVIGATION BROCHOSCOPY; Right     Comment:  Procedure: ELECTROMAGNETIC NAVIGATION BRONCHOSCOPY;                Surgeon: KaFlora LippsMD;  Location: [facility] ORS;  Service:              Cardiopulmonary;  Laterality: Right; No date: EYE SURGERY; Right     Comment:  Cataract Extraction with IOL No date: RETINAL LASER PROCEDURE  BMI    Body Mass Index: 22.05 kg/m      Reproductive/Obstetrics negative OB ROS                             Anesthesia Physical Anesthesia Plan  ASA: 3  Anesthesia Plan: General   Post-op Pain Management: Minimal or no pain anticipated   Induction: Intravenous  PONV Risk Score and Plan: Propofol infusion and TIVA  Airway Management Planned: Natural Airway  Additional Equipment:   Intra-op Plan:   Post-operative Plan:   Informed Consent: I have reviewed the patients History  and Physical, chart, labs and discussed the procedure including the risks, benefits and alternatives for the proposed anesthesia with the patient or authorized representative who has indicated his/her understanding and acceptance.     Dental Advisory Given  Plan Discussed with: Anesthesiologist, CRNA and Surgeon  Anesthesia Plan Comments:         Anesthesia Quick Evaluation

## 2022-02-27 NOTE — Anesthesia Procedure Notes (Signed)
Date/Time: 02/27/2022 7:38 AM  Performed by: Johnna Acosta, CRNAPre-anesthesia Checklist: Patient identified, Timeout performed, Emergency Drugs available, Suction available and Patient being monitored Patient Re-evaluated:Patient Re-evaluated prior to induction Oxygen Delivery Method: Nasal cannula Preoxygenation: Pre-oxygenation with 100% oxygen Induction Type: IV induction

## 2022-02-28 NOTE — Anesthesia Postprocedure Evaluation (Signed)
Anesthesia Post Note  Patient: Garrett Yoder.  Procedure(s) Performed: CARDIOVERSION  Patient location during evaluation: PACU Anesthesia Type: General Level of consciousness: awake and alert Pain management: pain level controlled Vital Signs Assessment: post-procedure vital signs reviewed and stable Respiratory status: spontaneous breathing, nonlabored ventilation and respiratory function stable Cardiovascular status: blood pressure returned to baseline and stable Postop Assessment: no apparent nausea or vomiting Anesthetic complications: no   No notable events documented.   Last Vitals:  Vitals:   02/27/22 0815 02/27/22 0830  BP: (!) 101/55 (!) 97/50  Pulse: (!) 58 66  Resp: 18 12  Temp:    SpO2: (!) 88% 99%    Last Pain:  Vitals:   02/27/22 0830  TempSrc:   PainSc: 0-No pain                 [place] Ouch

## 2022-03-01 NOTE — H&P (Signed)
H&P Addendum, pre-cardioversion ° °Patient was seen and evaluated prior to -cardioversion procedure °Symptoms, prior testing details again confirmed with the patient °Patient examined, no significant change from prior exam °Lab work reviewed in detail personally by myself °Patient understands risk and benefit of the procedure,  °The risks (stroke, cardiac arrhythmias rarely resulting in the need for a temporary or permanent pacemaker, skin irritation or burns and complications associated with conscious sedation including aspiration, arrhythmia, respiratory failure and death), benefits (restoration of normal sinus rhythm) and alternatives of a direct current cardioversion were explained in detail °Patient willing to proceed. ° °Signed, °Tim Nathan Moctezuma, MD, Ph.D °[facility]  °

## 2022-03-01 NOTE — Interval H&P Note (Signed)
History and Physical Interval Note:  03/01/2022 6:12 PM  [name]  has presented today for surgery, with the diagnosis of Cardioversion   Afib.  The various methods of treatment have been discussed with the patient and family. After consideration of risks, benefits and other options for treatment, the patient has consented to  Procedure(s): CARDIOVERSION (N/A) as a surgical intervention.  The patient's history has been reviewed, patient examined, no change in status, stable for surgery.  I have reviewed the patient's chart and labs.  Questions were answered to the patient's satisfaction.     Garrett Yoder

## 2022-03-03 ENCOUNTER — Telehealth: Payer: Self-pay | Admitting: Cardiology

## 2022-03-03 ENCOUNTER — Other Ambulatory Visit: Payer: Self-pay

## 2022-03-03 DIAGNOSIS — I4891 Unspecified atrial fibrillation: Secondary | ICD-10-CM

## 2022-03-03 MED ORDER — APIXABAN 5 MG PO TABS: 5.0000 mg | ORAL_TABLET | Freq: Two times a day (BID) | ORAL | 3 refills | Status: DC

## 2022-03-03 NOTE — Telephone Encounter (Signed)
Pt c/o medication issue:  1. Name of Medication:   metoprolol tartrate (LOPRESSOR) 25 MG tablet    2. How are you currently taking this medication (dosage and times per day)?  50 mg daily   3. Are you having a reaction (difficulty breathing--STAT)?   4. What is your medication issue? Pt states he has been taking this medication 25 mg tablets twice daily, but he received a refill with [place], NP name on it for '50mg'$  tablets take once a day. Pt just wants to clarify if this is correct.

## 2022-03-03 NOTE — Telephone Encounter (Signed)
Prescription refill request for Eliquis received. Indication: Afib  Last office visit: 02/14/22 ([place])  Scr: 1.46 (02/21/22)  Age: 77 Weight: 65.8kg  Appropriate dose and refill sent to requested pharmacy.

## 2022-03-03 NOTE — Telephone Encounter (Signed)
Returned the call to the patient. He stated that he was taking the Metoprolol 50 mg twice daily (2 of his 25 mg twice daily to equal the 50 mg bid).  He received his new prescription bottle which states take one 50 mg tablet twice daily. He stated that he got confused but now realizes this is the same dose. He will stay on the one 50 mg tablet twice daily.

## 2022-03-06 ENCOUNTER — Telehealth: Payer: Self-pay | Admitting: Cardiovascular Disease

## 2022-03-06 ENCOUNTER — Encounter: Payer: Self-pay | Admitting: Cardiovascular Disease

## 2022-03-06 NOTE — Telephone Encounter (Signed)
Patient returning call.

## 2022-03-06 NOTE — Telephone Encounter (Signed)
Returned the call to the patient. He stated that he feels like he has been in afb since 1/16. He had a cardioversion 1/11. Starting 1/16, his heart rate has been from 100-120. He is asymptomatic.  Appointment made for 1/19 with APP for further assessment.  He is currently taking Metoprolol 50 mg bid and Eliquis 5 mg bid.

## 2022-03-06 NOTE — Telephone Encounter (Signed)
Patient c/o Palpitations:  High priority if patient c/o lightheadedness, shortness of breath, or chest pain  How long have you had palpitations/irregular HR/ Afib? Are you having the symptoms now? A couple days, yes checked at 6:30 am  Are you currently experiencing lightheadedness, SOB or CP? no  Do you have a history of afib (atrial fibrillation) or irregular heart rhythm? yes  Have you checked your BP or HR? (document readings if available): 112/65 HR 115  Are you experiencing any other symptoms? no  Patient thinks he is back in afib. He says his HR has been over 100 and that had happened when he was in afib in the past. He says he is not having any symptoms. He says he is supposed to be at [facility] at 9 am for labs and says he can come straight to the office afterward, if that is convenient.

## 2022-03-06 NOTE — Telephone Encounter (Signed)
Attempted to leave a message. Voicemail was full.

## 2022-03-06 NOTE — Telephone Encounter (Signed)
error 

## 2022-03-07 ENCOUNTER — Ambulatory Visit: Payer: Medicare Other | Attending: Nurse Practitioner | Admitting: Nurse Practitioner

## 2022-03-07 ENCOUNTER — Encounter: Payer: Self-pay | Admitting: Nurse Practitioner

## 2022-03-07 VITALS — BP 100/60 | HR 105 | Ht 68.0 in | Wt 139.5 lb

## 2022-03-07 DIAGNOSIS — I4819 Other persistent atrial fibrillation: Secondary | ICD-10-CM

## 2022-03-07 DIAGNOSIS — I10 Essential (primary) hypertension: Secondary | ICD-10-CM | POA: Diagnosis not present

## 2022-03-07 DIAGNOSIS — E782 Mixed hyperlipidemia: Secondary | ICD-10-CM

## 2022-03-07 DIAGNOSIS — N1832 Chronic kidney disease, stage 3b: Secondary | ICD-10-CM

## 2022-03-07 NOTE — Progress Notes (Signed)
Office Visit    Patient Name: Garrett Yoder. Date of Encounter: 03/07/2022  Primary Care Provider:  Bo Merino, FNP Primary Cardiologist:  Kathlyn Sacramento, MD  Chief Complaint    77 y/o ? w/ a h/o paroxysmal atrial fibrillation, coronary calcifications, aortic atherosclerosis, hypertension, hyperlipidemia, stage IIIb chronic kidney disease, and type 2 diabetes mellitus, who presents after recent cardioversion with recurrent afib.  Past Medical History    Past Medical History:  Diagnosis Date   Anemia 08/14/2015   Anxiety    Aortic atherosclerosis ([place]) 11/25/2016   Chest CT Oct 2018   Aorto-iliac atherosclerosis ([place]) 11/25/2016   Abd/pelvic CT Oct 2018   Coronary artery calcification seen on CAT scan 11/25/2016   Chest CT Oct 2018; refer to Dr. Fletcher Anon   DDD (degenerative disc disease), cervical 08/21/2016   Depression    Diabetes mellitus without complication (HCC)    Facet arthropathy, cervical 08/21/2016   GERD (gastroesophageal reflux disease)    Heart murmur    As child   History of echocardiogram    a. 01/2022 Echo: EF 50-55%, no rwma, nl RV fxn w/ RVSP 34.64mHg, Mildly dil RA. Mild MR. Mod TR. AoV sclerosis w/o stenosis.   History of stress test    a. 01/2017 Ex MV: Poor ex capacity - held in stage I. 1254mInflat ST depression. No ischemia/infarct. EF 55-65%.   Hyperlipidemia    Hypertension    Hypertrophy of prostate with urinary obstruction 11/25/2016   Pelvic CT October 2018   Incidental lung nodule, > 54m254mnd < 8mm754m/10/2016   5 mm nodule LLL, chest CT Oct 2018   Persistent atrial fibrillation (HCC)[place] a. 02/2022 Dx in 01/2022-->CHA2DS2VASc = 5-->Eliquis; b. 02/2022 s/p DCCV (150J->200J->RSR).   Thrombocytosis 08/14/2015   Past Surgical History:  Procedure Laterality Date   ANTERIOR CERVICAL DECOMP/DISCECTOMY FUSION N/A 09/24/2016   Procedure: ANTERIOR CERVICAL DECOMPRESSION/DISCECTOMY FUSION 3 LEVELS-C4-7;  Surgeon: YarbMeade Maw;   Location: [facility] ORS;  Service: Neurosurgery;  Laterality: N/A;   CARDIOVERSION N/A 02/27/2022   Procedure: CARDIOVERSION;  Surgeon: GollMinna Merritts;  Location: [facility] ORS;  Service: Cardiovascular;  Laterality: N/A;   CATARACT EXTRACTION     ELECTROMAGNETIC NAVIGATION BROCHOSCOPY Right 04/07/2018   Procedure: ELECTROMAGNETIC NAVIGATION BRONCHOSCOPY;  Surgeon: KasaFlora Lipps;  Location: [facility] ORS;  Service: Cardiopulmonary;  Laterality: Right;   EYE SURGERY Right    Cataract Extraction with IOL   RETINAL LASER PROCEDURE      Allergies  Allergies  Allergen Reactions   Codeine Nausea Only    Other reaction(s): makes him sick to his stomach Other reaction(s): makes him sick to his stomach    History of Present Illness    76 y27r old male with the above past medical history including paroxysmal atrial fibrillation, coronary calcifications, aortic atherosclerosis, hypertension, hyperlipidemia, stage III chronic kidney disease, type 2 diabetes mellitus.  He established with Dr. AridFletcher AnonDecember 2018 following imaging which incidentally showed aortic atherosclerosis.  He underwent stress testing in December 2018 in the setting of mild exertional dyspnea, which was low risk without ischemia or infarct with normal LV function.  He was referred back to our clinic in November 2023 in the setting of new onset atrial fibrillation diagnosed by primary care.  He was started on Eliquis and metoprolol.  Echocardiogram in December 2018 showed an EF of 50-55% without regional wall motion abnormalities, RVSP of 34.2 mmHg, mild MR, and moderate TR.  Has rates remained  poorly controlled on December 15 visit, metoprolol dose was increased and he subsequently underwent direct-current cardioversion on January 11, 2 biphasic shocks (150 J, 200 J), and restoration of sinus rhythm.  Beginning January 16, he noted recurrent tachycardia with heart rates in the 90-120 range.  He contacted our office was added on today.   Garrett Yoder says that he did not notice any difference in symptoms following his cardioversion.  He walks his dog for 1 mile twice a day and is able to do this without any symptoms or limitations, ever since being placed on beta-blocker therapy.  He did notice some dyspnea on exertion prior to being placed on beta-blocker therapy.  He denies chest pain, palpitations, PND, orthopnea, dizziness, syncope, edema, or early satiety.  Home Medications    Current Outpatient Medications  Medication Sig Dispense Refill   acetaminophen (TYLENOL) 500 MG tablet Take 500 mg by mouth 2 (two) times daily as needed.     apixaban (ELIQUIS) 5 MG TABS tablet Take 1 tablet (5 mg total) by mouth 2 (two) times daily. 180 tablet 3   atorvastatin (LIPITOR) 40 MG tablet TAKE 1 TABLET BY MOUTH DAILY 100 tablet 1   bimatoprost (LUMIGAN) 0.01 % SOLN Place 1 drop into both eyes at bedtime.      finasteride (PROSCAR) 5 MG tablet TAKE 1 TABLET BY MOUTH DAILY 100 tablet 2   insulin detemir (LEVEMIR FLEXTOUCH) 100 UNIT/ML FlexPen Inject 30 Units into the skin every evening.     insulin lispro (HUMALOG) 100 UNIT/ML KwikPen Inject into the skin daily. Pt taking 4 units in AM, 12 units at noon     irbesartan (AVAPRO) 75 MG tablet Take 1 tablet (75 mg total) by mouth daily. 90 tablet 1   metFORMIN (GLUCOPHAGE) 1000 MG tablet Take 500 mg by mouth daily with breakfast.     metoprolol tartrate (LOPRESSOR) 50 MG tablet Take 50 mg by mouth 2 (two) times daily.     Multiple Vitamin (MULTI-VITAMINS) TABS Take 1 tablet by mouth 2 (two) times daily.      pioglitazone (ACTOS) 15 MG tablet Take 15 mg by mouth daily.     SIMBRINZA 1-0.2 % SUSP Place 1 drop into both eyes 3 (three) times daily.      tamsulosin (FLOMAX) 0.4 MG CAPS capsule TAKE 1 CAPSULE BY MOUTH  DAILY 90 capsule 3   No current facility-administered medications for this visit.     Review of Systems    He denies chest pain, palpitations, dyspnea, pnd, orthopnea, n, v,  dizziness, syncope, edema, weight gain, or early satiety.  All other systems reviewed and are otherwise negative except as noted above.    Physical Exam    VS:  BP 100/60 (BP Location: Left Arm, Patient Position: Sitting, Cuff Size: Normal)   Pulse (!) 105   Ht '5\' 8"'$  (1.727 m)   Wt 139 lb 8 oz (63.3 kg)   SpO2 98%   BMI 21.21 kg/m  , BMI Body mass index is 21.21 kg/m.     GEN: Well nourished, well developed, in no acute distress. HEENT: normal. Neck: Supple, no JVD, carotid bruits, or masses. Cardiac: IR, IR, no murmurs, rubs, or gallops. No clubbing, cyanosis, edema.  Radials 2+/PT 2+ and equal bilaterally.  Respiratory:  Respirations regular and unlabored, clear to auscultation bilaterally. GI: Soft, nontender, nondistended, BS + x 4. MS: no deformity or atrophy. Skin: warm and dry, no rash. Neuro:  Strength and sensation are intact. Psych: Normal affect.  Accessory Clinical Findings    ECG personally reviewed by me today - Afib, 105, PVC, rightward axis, lateral infarct- no acute changes.  Lab Results  Component Value Date   WBC 7.3 02/21/2022   HGB 11.9 (L) 02/21/2022   HCT 36.9 (L) 02/21/2022   MCV 94.6 02/21/2022   PLT 403 (H) 02/21/2022   Lab Results  Component Value Date   CREATININE 1.46 (H) 02/21/2022   BUN 24 (H) 02/21/2022   NA 133 (L) 02/21/2022   K 4.6 02/21/2022   CL 100 02/21/2022   CO2 25 02/21/2022   Lab Results  Component Value Date   ALT 23 01/02/2022   AST 23 01/02/2022   ALKPHOS 101 01/02/2022   BILITOT 0.6 01/02/2022   Lab Results  Component Value Date   CHOL 138 07/02/2021   HDL 60 07/02/2021   LDLCALC 57 07/02/2021   TRIG 127 07/02/2021   CHOLHDL 2.3 07/02/2021    Lab Results  Component Value Date   HGBA1C 7.1 (H) 07/02/2021    Assessment & Plan    1.  Persistent atrial fibrillation: Diagnosed with atrial fibrillation in November 2023 with recent cardioversion on January 11, which was successful after 2 shocks.  Rates were  well-controlled following cardioversion, the patient says he did not experience any change in the way he felt, as he was asymptomatic while in A-fib.  Since January 16, heart rates have been elevated between the mid 90s and 120 bpm.  Patient suspected he was back in A-fib and called our office and was added on today.  He is back in A-fib at 105 bpm today and remains asymptomatic.  We discussed rate control versus rhythm control options.  His blood pressure is 100/60, thus limiting options to titrate beta-blocker therapy.  In the setting of stage III chronic kidney disease, he is not ideal candidate for digoxin for rate control.  I offered him referral to the A-fib clinic in [place] to discuss antiarrhythmic options however he prefers to be seen locally and instead will be seeing Dr. Quentin Ore within the next few weeks.  Continue metoprolol 50 mg twice daily and Eliquis 5 mg twice daily.  He understands to report if he develops symptoms.  2.  Essential hypertension: Stable to low on beta-blocker therapy.  3.  Coronary calcifications on CT: Prior imaging with coronary calcifications followed by stress testing in 2018, which was nonischemic.  He is active without chest pain or dyspnea with an LDL of 57 last May.  He remains on statin therapy.  4.  Stage IIIb chronic kidney disease: Creatinine stable at 1.46 on January 5.  5.  Hyperlipidemia: Remains on statin therapy with an LDL of 57 in May 2023 and normal LFTs in November 2023.  6.  Disposition: Patient to follow-up with electrophysiology within the next few weeks.   Murray Hodgkins, NP 03/07/2022, 11:47 AM

## 2022-03-07 NOTE — Patient Instructions (Signed)
Medication Instructions:  Your physician recommends that you continue on your current medications as directed. Please refer to the Current Medication list given to you today.  *If you need a refill on your cardiac medications before your next appointment, please call your pharmacy*   Lab Work: No labs ordered  If you have labs (blood work) drawn today and your tests are completely normal, you will receive your results only by: Robert Lee (if you have MyChart) OR A paper copy in the mail If you have any lab test that is abnormal or we need to change your treatment, we will call you to review the results.   Testing/Procedures: No testing ordered  Follow-Up: At [facility], you and your health needs are our priority.  As part of our continuing mission to provide you with exceptional heart care, we have created designated Provider Care Teams.  These Care Teams include your primary Cardiologist (physician) and Advanced Practice Providers (APPs -  Physician Assistants and Nurse Practitioners) who all work together to provide you with the care you need, when you need it.  We recommend signing up for the patient portal called "MyChart".  Sign up information is provided on this After Visit Summary.  MyChart is used to connect with patients for Virtual Visits (Telemedicine).  Patients are able to view lab/test results, encounter notes, upcoming appointments, etc.  Non-urgent messages can be sent to your provider as well.   To learn more about what you can do with MyChart, go to [email or web address].    Other Instructions You are being referred to see Dr. Quentin Ore for management of Atrial Fibrillation.

## 2022-03-18 ENCOUNTER — Ambulatory Visit: Payer: Medicare Other | Admitting: Cardiology

## 2022-03-25 ENCOUNTER — Telehealth: Payer: Self-pay | Admitting: Nurse Practitioner

## 2022-03-26 ENCOUNTER — Other Ambulatory Visit: Payer: Self-pay

## 2022-03-26 NOTE — Telephone Encounter (Signed)
Unable to refill per protocol, Rx request is too soon. Last refill 05/01/21 for 90 and 3 refills.  Requested Prescriptions  Pending Prescriptions Disp Refills   tamsulosin (FLOMAX) 0.4 MG CAPS capsule [Pharmacy Med Name: Tamsulosin HCl 0.4 MG Oral Capsule] 100 capsule 2    Sig: TAKE 1 CAPSULE BY MOUTH DAILY     Urology: Alpha-Adrenergic Blocker Failed - 03/25/2022  7:27 AM      Failed - PSA in normal range and within 360 days    Prostate Specific Ag, Serum  Date Value Ref Range Status  11/14/2016 2.6 0.0 - 4.0 ng/mL Final    Comment:    Roche ECLIA methodology. According to the American Urological Association, Serum PSA should decrease and remain at undetectable levels after radical prostatectomy. The AUA defines biochemical recurrence as an initial PSA value 0.2 ng/mL or greater followed by a subsequent confirmatory PSA value 0.2 ng/mL or greater. Values obtained with different assay methods or kits cannot be used interchangeably. Results cannot be interpreted as absolute evidence of the presence or absence of malignant disease.          Passed - Last BP in normal range    BP Readings from Last 1 Encounters:  03/07/22 100/60         Passed - Valid encounter within last 12 months    Recent Outpatient Visits           2 months ago Mixed hyperlipidemia   [place], FNP   8 months ago Type 2 diabetes mellitus with hyperglycemia, with long-term current use of insulin [facility])   [facility] Bo Merino, FNP   11 months ago Essential hypertension, benign   [facility] Bo Merino, FNP   1 year ago Essential hypertension, benign   [facility] Bo Merino, FNP   1 year ago Chronic kidney disease, stage 3b [facility])   [facility] Kathrine Haddock, NP       Future Appointments             In 1 week [place], MD [place]   In 3 months Reece Packer, Myna Hidalgo, [facility], [place]   In 6 months Reece Packer, Myna Hidalgo, [facility], [facility]

## 2022-03-27 MED ORDER — TAMSULOSIN HCL 0.4 MG PO CAPS: 0.4000 mg | ORAL_CAPSULE | Freq: Every day | ORAL | 3 refills | Status: DC

## 2022-04-01 NOTE — Progress Notes (Unsigned)
Electrophysiology Office Note:    Date:  04/02/2022   ID:  Garrett Spikes., DOB 1945/06/11, MRN [id number]  PCP:  Bo Merino, FNP  [facility] Cardiologist:  Kathlyn Sacramento, MD  [facility] Electrophysiologist:  Vickie Epley, MD   Referring MD: Wilder Glade*   Chief Complaint: pAF  History of Present Illness:    Garrett Thwing. is a 77 y.o. male who presents for an evaluation of pAF at the request of Ignacia Bayley, NP. Their medical history includes aortic athero, CAD, DM, GERD, HTN, pers AF on Eliquis.  Saw Gerald Stabs 03/07/2022. Diagnosed with AF November 2023. On Eliquis. Had DCCV 02/27/2022. Had recurrence of AF 03/04/2022 with rapid rates. According to notes from previous appt, his AF has been asymptomatic.  Today he again confirms he is asymptomatic with atrial fibrillation.  He walks several times per day, each time 1 mile with his dog.  No shortness of breath during his exercise.  No signs or symptoms of heart failure.     Past Medical History:  Diagnosis Date   Anemia 08/14/2015   Anxiety    Aortic atherosclerosis ([place]) 11/25/2016   Chest CT Oct 2018   Aorto-iliac atherosclerosis ([place]) 11/25/2016   Abd/pelvic CT Oct 2018   Coronary artery calcification seen on CAT scan 11/25/2016   Chest CT Oct 2018; refer to Dr. Fletcher Anon   DDD (degenerative disc disease), cervical 08/21/2016   Depression    Diabetes mellitus without complication (HCC)    Facet arthropathy, cervical 08/21/2016   GERD (gastroesophageal reflux disease)    Heart murmur    As child   History of echocardiogram    a. 01/2022 Echo: EF 50-55%, no rwma, nl RV fxn w/ RVSP 34.28mHg, Mildly dil RA. Mild MR. Mod TR. AoV sclerosis w/o stenosis.   History of stress test    a. 01/2017 Ex MV: Poor ex capacity - held in stage I. 174mInflat ST depression. No ischemia/infarct. EF 55-65%.   Hyperlipidemia    Hypertension    Hypertrophy of prostate with urinary obstruction 11/25/2016   Pelvic CT  October 2018   Incidental lung nodule, > 36m7mnd < 8mm68m/10/2016   5 mm nodule LLL, chest CT Oct 2018   Persistent atrial fibrillation (HCC)[place] a. 02/2022 Dx in 01/2022-->CHA2DS2VASc = 5-->Eliquis; b. 02/2022 s/p DCCV (150J->200J->RSR).   Thrombocytosis 08/14/2015    Past Surgical History:  Procedure Laterality Date   ANTERIOR CERVICAL DECOMP/DISCECTOMY FUSION N/A 09/24/2016   Procedure: ANTERIOR CERVICAL DECOMPRESSION/DISCECTOMY FUSION 3 LEVELS-C4-7;  Surgeon: YarbMeade Maw;  Location: [facility] ORS;  Service: Neurosurgery;  Laterality: N/A;   CARDIOVERSION N/A 02/27/2022   Procedure: CARDIOVERSION;  Surgeon: GollMinna Merritts;  Location: [facility] ORS;  Service: Cardiovascular;  Laterality: N/A;   CATARACT EXTRACTION     ELECTROMAGNETIC NAVIGATION BROCHOSCOPY Right 04/07/2018   Procedure: ELECTROMAGNETIC NAVIGATION BRONCHOSCOPY;  Surgeon: KasaFlora Lipps;  Location: [facility] ORS;  Service: Cardiopulmonary;  Laterality: Right;   EYE SURGERY Right    Cataract Extraction with IOL   RETINAL LASER PROCEDURE      Current Medications: Current Meds  Medication Sig   acetaminophen (TYLENOL) 500 MG tablet Take 500 mg by mouth 2 (two) times daily as needed.   apixaban (ELIQUIS) 5 MG TABS tablet Take 1 tablet (5 mg total) by mouth 2 (two) times daily.   atorvastatin (LIPITOR) 40 MG tablet TAKE 1 TABLET BY MOUTH DAILY   bimatoprost (LUMIGAN) 0.01 % SOLN Place  1 drop into both eyes at bedtime.    finasteride (PROSCAR) 5 MG tablet TAKE 1 TABLET BY MOUTH DAILY   insulin detemir (LEVEMIR FLEXTOUCH) 100 UNIT/ML FlexPen Inject 30 Units into the skin every evening.   insulin lispro (HUMALOG) 100 UNIT/ML KwikPen Inject into the skin daily. Pt taking 4 units in AM, 12 units at noon   irbesartan (AVAPRO) 75 MG tablet Take 1 tablet (75 mg total) by mouth daily.   metFORMIN (GLUCOPHAGE) 1000 MG tablet Take 500 mg by mouth daily with breakfast.   metoprolol tartrate (LOPRESSOR) 50 MG tablet Take 50 mg by mouth  2 (two) times daily.   Multiple Vitamin (MULTI-VITAMINS) TABS Take 1 tablet by mouth 2 (two) times daily.    pioglitazone (ACTOS) 15 MG tablet Take 15 mg by mouth daily.   SIMBRINZA 1-0.2 % SUSP Place 1 drop into both eyes 3 (three) times daily.    tamsulosin (FLOMAX) 0.4 MG CAPS capsule Take 1 capsule (0.4 mg total) by mouth daily.     Allergies:   Codeine   Social History   Socioeconomic History   Marital status: Widowed    Spouse name: Bethena Roys   Number of children: Not on file   Years of education: Not on file   Highest education level: Not on file  Occupational History   Occupation: retired  Tobacco Use   Smoking status: Former    Packs/day: 1.00    Years: 30.00    Total pack years: 30.00    Types: Cigarettes    Quit date: 07/19/1999    Years since quitting: 22.7   Smokeless tobacco: Never  Vaping Use   Vaping Use: Never used  Substance and Sexual Activity   Alcohol use: No    Alcohol/week: 0.0 standard drinks of alcohol   Drug use: No   Sexual activity: Yes    Partners: Female  Other Topics Concern   Not on file  Social History Narrative   Not on file   Social Determinants of Health   Financial Resource Strain: Low Risk  (09/24/2021)   Overall Financial Resource Strain (CARDIA)    Difficulty of Paying Living Expenses: Not hard at all  Food Insecurity: No Food Insecurity (09/24/2021)   Hunger Vital Sign    Worried About Running Out of Food in the Last Year: Never true    [place] in the Last Year: Never true  Transportation Needs: No Transportation Needs (09/24/2021)   PRAPARE - Hydrologist (Medical): No    Lack of Transportation (Non-Medical): No  Physical Activity: Insufficiently Active (09/24/2021)   Exercise Vital Sign    Days of Exercise per Week: 3 days    Minutes of Exercise per Session: 20 min  Stress: No Stress Concern Present (09/24/2021)   [place]     Feeling of Stress : Only a little  Social Connections: Moderately Isolated (09/24/2021)   Social Connection and Isolation Panel [NHANES]    Frequency of Communication with Friends and Family: Twice a week    Frequency of Social Gatherings with Friends and Family: Twice a week    Attends Religious Services: 1 to 4 times per year    Active Member of [employer] or Organizations: Not on file    Attends Archivist Meetings: Never    Marital Status: Widowed     Family History: The patient's family history includes Cancer in his maternal grandfather; Diabetes in  his sister; Diabetes Mellitus II in his father; Heart Problems in his father; Heart disease in his father; Stroke in his mother and paternal grandmother. There is no history of Prostate cancer, Bladder Cancer, or Kidney cancer.  ROS:   Please see the history of present illness.    All other systems reviewed and are negative.  EKGs/Labs/Other Studies Reviewed:    The following studies were reviewed today:  01/30/2022 Echo EF 50 RV normal Mild MR Moderate TR  03/07/2022 ECG shows AF w/ RVR    Recent Labs: 01/02/2022: ALT 23; TSH 2.829 02/21/2022: BUN 24; Creatinine, Ser 1.46; Hemoglobin 11.9; Magnesium 2.1; Platelets 403; Potassium 4.6; Sodium 133  Recent Lipid Panel    Component Value Date/Time   CHOL 138 07/02/2021 1350   CHOL 155 07/24/2016 0829   TRIG 127 07/02/2021 1350   HDL 60 07/02/2021 1350   HDL 62 07/24/2016 0829   CHOLHDL 2.3 07/02/2021 1350   VLDL 22 08/14/2015 1532   LDLCALC 57 07/02/2021 1350    Physical Exam:    VS:  BP 110/60   Pulse (!) 104   Ht 5' 8"$  (1.727 m)   Wt 145 lb (65.8 kg)   SpO2 99%   BMI 22.05 kg/m     Wt Readings from Last 3 Encounters:  04/02/22 145 lb (65.8 kg)  03/07/22 139 lb 8 oz (63.3 kg)  02/27/22 145 lb (65.8 kg)     GEN:  Well nourished, well developed in no acute distress CARDIAC: Irregularly irregular, tachycardic, no murmurs, rubs, gallops      ASSESSMENT:     1. Persistent atrial fibrillation ([place])   2. Primary hypertension   3. Aortic atherosclerosis (HCC)    PLAN:    In order of problems listed above:   #Pers AF Continue Eliquis Increase metoprolol  I discussed treatment options for his atrial fibrillation including rate control, rhythm control using antiarrhythmic drugs and rhythm control using catheter ablation.  He is inclined to pursue a conservative management strategy which I think is very reasonable given the asymptomatic nature of his atrial fibrillation and normal LV function.  I will increase his metoprolol to tartrate to 75 mg by mouth twice daily and have him follow-up with an APP in 3 months.  #Aortic Athero Cont atorvastatin  #Hypertension At goal today.  Recommend checking blood pressures 1-2 times per week at home and recording the values.  Recommend bringing these recordings to the primary care physician.   Follow-up 3 months with APP.  Medication Adjustments/Labs and Tests Ordered: Current medicines are reviewed at length with the patient today.  Concerns regarding medicines are outlined above.  No orders of the defined types were placed in this encounter.  No orders of the defined types were placed in this encounter.    Signed, Hilton Cork. Quentin Ore, MD, [facility], [facility] 04/02/2022 9:12 AM    Electrophysiology [facility]

## 2022-04-02 ENCOUNTER — Ambulatory Visit: Payer: Medicare Other | Admitting: Urology

## 2022-04-02 ENCOUNTER — Ambulatory Visit: Payer: Medicare Other | Attending: Cardiology | Admitting: Cardiology

## 2022-04-02 ENCOUNTER — Encounter: Payer: Self-pay | Admitting: Cardiology

## 2022-04-02 VITALS — BP 110/60 | HR 104 | Ht 68.0 in | Wt 145.0 lb

## 2022-04-02 DIAGNOSIS — I700 Atherosclerosis of aorta: Secondary | ICD-10-CM | POA: Diagnosis not present

## 2022-04-02 DIAGNOSIS — I10 Essential (primary) hypertension: Secondary | ICD-10-CM | POA: Diagnosis not present

## 2022-04-02 DIAGNOSIS — I4819 Other persistent atrial fibrillation: Secondary | ICD-10-CM

## 2022-04-02 MED ORDER — METOPROLOL TARTRATE 50 MG PO TABS: 75.0000 mg | ORAL_TABLET | Freq: Two times a day (BID) | ORAL | 3 refills | Status: DC

## 2022-04-02 NOTE — Patient Instructions (Signed)
Medication Instructions:  Your physician has recommended you make the following change in your medication:  1) INCREASE Lopressor (metoprolol tartrate) to 75 mg twice daily   *If you need a refill on your cardiac medications before your next appointment, please call your pharmacy*  Follow-Up: At [facility], you and your health needs are our priority.  As part of our continuing mission to provide you with exceptional heart care, we have created designated Provider Care Teams.  These Care Teams include your primary Cardiologist (physician) and Advanced Practice Providers (APPs -  Physician Assistants and Nurse Practitioners) who all work together to provide you with the care you need, when you need it.  Your next appointment:   3 month(s)  Provider:   You will see one of the following Advanced Practice Providers on your designated Care Team:   Tommye Standard, [place]" [place], [place], NP

## 2022-05-09 ENCOUNTER — Other Ambulatory Visit: Payer: Self-pay | Admitting: Nurse Practitioner

## 2022-05-09 DIAGNOSIS — E782 Mixed hyperlipidemia: Secondary | ICD-10-CM

## 2022-05-09 NOTE — Telephone Encounter (Signed)
Unable to refill per protocol, Rx request is too soon. Last refill 11/21/10 for 100 days and 1 refill.  Requested Prescriptions  Pending Prescriptions Disp Refills   atorvastatin (LIPITOR) 40 MG tablet [Pharmacy Med Name: Atorvastatin Calcium 40 MG Oral Tablet] 100 tablet 2    Sig: TAKE 1 TABLET BY MOUTH DAILY     Cardiovascular:  Antilipid - Statins Failed - 05/09/2022  5:17 AM      Failed - Lipid Panel in normal range within the last 12 months    Cholesterol, Total  Date Value Ref Range Status  07/24/2016 155 100 - 199 mg/dL Final   Cholesterol  Date Value Ref Range Status  07/02/2021 138 <200 mg/dL Final   LDL Cholesterol (Calc)  Date Value Ref Range Status  07/02/2021 57 mg/dL (calc) Final    Comment:    Reference range: <100 . Desirable range <100 mg/dL for primary prevention;   <70 mg/dL for patients with CHD or diabetic patients  with > or = 2 CHD risk factors. Marland Kitchen LDL-C is now calculated using the Martin-Hopkins  calculation, which is a validated novel method providing  better accuracy than the Friedewald equation in the  estimation of LDL-C.  Cresenciano Genre et al. Annamaria Helling. MU:[id number]): 2061-2068  (http://education.[facility].com/faq/FAQ164)    HDL  Date Value Ref Range Status  07/02/2021 60 > OR = 40 mg/dL Final  07/24/2016 62 >39 mg/dL Final   Triglycerides  Date Value Ref Range Status  07/02/2021 127 <150 mg/dL Final         Passed - Patient is not pregnant      Passed - Valid encounter within last 12 months    Recent Outpatient Visits           4 months ago Mixed hyperlipidemia   [facility] Serafina Royals F, FNP   10 months ago Type 2 diabetes mellitus with hyperglycemia, with long-term current use of insulin [facility])   [facility] Bo Merino, FNP   1 year ago Essential hypertension, benign   [facility] Bo Merino, FNP   1 year ago Essential hypertension, benign    [facility] Bo Merino, FNP   1 year ago Chronic kidney disease, stage 3b [facility])   [facility] Kathrine Haddock, NP       Future Appointments             In 1 month Mamie Levers, NP [place] [facility] [place], [place], [facility], [place]   In 4 months  [facility], [facility]

## 2022-05-27 ENCOUNTER — Other Ambulatory Visit: Payer: Self-pay

## 2022-05-27 DIAGNOSIS — D649 Anemia, unspecified: Secondary | ICD-10-CM

## 2022-05-28 ENCOUNTER — Inpatient Hospital Stay: Payer: Medicare Other | Admitting: Oncology

## 2022-05-28 ENCOUNTER — Inpatient Hospital Stay: Payer: Medicare Other

## 2022-06-16 ENCOUNTER — Other Ambulatory Visit: Payer: Self-pay | Admitting: Nurse Practitioner

## 2022-06-16 DIAGNOSIS — E782 Mixed hyperlipidemia: Secondary | ICD-10-CM

## 2022-06-17 NOTE — Telephone Encounter (Signed)
Requested Prescriptions  Pending Prescriptions Disp Refills   atorvastatin (LIPITOR) 40 MG tablet [Pharmacy Med Name: Atorvastatin Calcium 40 MG Oral Tablet] 100 tablet 1    Sig: TAKE 1 TABLET BY MOUTH DAILY     Cardiovascular:  Antilipid - Statins Failed - 06/16/2022  9:51 AM      Failed - Lipid Panel in normal range within the last 12 months    Cholesterol, Total  Date Value Ref Range Status  07/24/2016 155 100 - 199 mg/dL Final   Cholesterol  Date Value Ref Range Status  07/02/2021 138 <200 mg/dL Final   LDL Cholesterol (Calc)  Date Value Ref Range Status  07/02/2021 57 mg/dL (calc) Final    Comment:    Reference range: <100 . Desirable range <100 mg/dL for primary prevention;   <70 mg/dL for patients with CHD or diabetic patients  with > or = 2 CHD risk factors. Marland Kitchen LDL-C is now calculated using the Martin-Hopkins  calculation, which is a validated novel method providing  better accuracy than the Friedewald equation in the  estimation of LDL-C.  Horald Pollen et al. Lenox Ahr. 1610;960(45): 2061-2068  (http://education.[facility].com/faq/FAQ164)    HDL  Date Value Ref Range Status  07/02/2021 60 > OR = 40 mg/dL Final  40/98/1191 62 >47 mg/dL Final   Triglycerides  Date Value Ref Range Status  07/02/2021 127 <150 mg/dL Final         Passed - Patient is not pregnant      Passed - Valid encounter within last 12 months    Recent Outpatient Visits           5 months ago Mixed hyperlipidemia   [facility] Della Goo F, FNP   11 months ago Type 2 diabetes mellitus with hyperglycemia, with long-term current use of insulin [facility])   [place] [facility] Berniece Salines, FNP   1 year ago Essential hypertension, benign   [facility] Berniece Salines, FNP   1 year ago Essential hypertension, benign   [facility] Berniece Salines, FNP   1 year ago Chronic kidney disease, stage  3b [facility])   [place] [facility] Gabriel Cirri, NP       Future Appointments             In 2 weeks Sherie Don, NP [facility] [place]   In 2 weeks Zane Herald, Rudolpho Sevin, FNP [facility]   In 3 months  [facility], [facility]

## 2022-06-27 NOTE — Progress Notes (Unsigned)
Cardiology Office Note Date:  07/01/2022  Patient ID:  Garrett Bend., DOB 20-Sep-1945, MRN [id number] PCP:  Berniece Salines, FNP  Cardiologist:  Lorine Bears, MD Electrophysiologist: Lanier Prude, MD    Chief Complaint: Afib follow-up  History of Present Illness: Garrett Soliven. is a 77 y.o. male with PMH notable for persis AFib, CAD, HTN; seen today for Lanier Prude, MD for routine electrophysiology followup.  Diagnosed with afib 12/2021, DCCV 02/27/22 with return of AF on 1/16.   Saw Dr. Lalla Brothers 03/2022, was asymptomatic from an A-fib perspective. Discussed long-term management strategies patient preferred rate control given asymptomatic nature of A-fib and normal LV function.  His lopressor was increased to 75 mg twice daily.  Today, he tells me that he feels overall very well. 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 Denies SOB, palpitations, chest pain or pressure. No dizziness, syncope or presyncope. He does admit to getting tired a little more quickly than he used to several years ago, but has been a gradual slowing down.  He has a BP log with home readings, most readings are systolic 80-100, rare upper 70s, rare 110s.   He diligently takes eliquis, no missed doses, no bleeding concerns.   Past Medical History:  Diagnosis Date   Anemia 08/14/2015   Anxiety    Aortic atherosclerosis (HCC) 11/25/2016   Chest CT Oct 2018   Aorto-iliac atherosclerosis (HCC) 11/25/2016   Abd/pelvic CT Oct 2018   Coronary artery calcification seen on CAT scan 11/25/2016   Chest CT Oct 2018; refer to Dr. Kirke Corin   DDD (degenerative disc disease), cervical 08/21/2016   Depression    Diabetes mellitus without complication (HCC)    Facet arthropathy, cervical 08/21/2016   GERD (gastroesophageal reflux disease)    Heart murmur    As child   History of echocardiogram    a. 01/2022 Echo: EF 50-55%, no rwma, nl RV fxn w/ RVSP 34.66mmHg, Mildly dil RA. Mild MR. Mod  TR. AoV sclerosis w/o stenosis.   History of stress test    a. 01/2017 Ex MV: Poor ex capacity - held in stage I. 1mm Inflat ST depression. No ischemia/infarct. EF 55-65%.   Hyperlipidemia    Hypertension    Hypertrophy of prostate with urinary obstruction 11/25/2016   Pelvic CT October 2018   Incidental lung nodule, > 3mm and < 8mm 11/25/2016   5 mm nodule LLL, chest CT Oct 2018   Persistent atrial fibrillation (HCC)    a. 02/2022 Dx in 01/2022-->CHA2DS2VASc = 5-->Eliquis; b. 02/2022 s/p DCCV (150J->200J->RSR).   Thrombocytosis 08/14/2015    Past Surgical History:  Procedure Laterality Date   ANTERIOR CERVICAL DECOMP/DISCECTOMY FUSION N/A 09/24/2016   Procedure: ANTERIOR CERVICAL DECOMPRESSION/DISCECTOMY FUSION 3 LEVELS-C4-7;  Surgeon: Venetia Night, MD;  Location: [facility] ORS;  Service: Neurosurgery;  Laterality: N/A;   CARDIOVERSION N/A 02/27/2022   Procedure: CARDIOVERSION;  Surgeon: Antonieta Iba, MD;  Location: [facility] ORS;  Service: Cardiovascular;  Laterality: N/A;   CATARACT EXTRACTION     ELECTROMAGNETIC NAVIGATION BROCHOSCOPY Right 04/07/2018   Procedure: ELECTROMAGNETIC NAVIGATION BRONCHOSCOPY;  Surgeon: Erin Fulling, MD;  Location: [facility] ORS;  Service: Cardiopulmonary;  Laterality: Right;   EYE SURGERY Right    Cataract Extraction with IOL   RETINAL LASER PROCEDURE      Current Outpatient Medications  Medication Instructions   acetaminophen (TYLENOL) 500 mg, Oral, 2 times daily PRN   apixaban (ELIQUIS) 5 mg, Oral, 2 times daily  atorvastatin (LIPITOR) 40 mg, Oral, Daily   bimatoprost (LUMIGAN) 0.01 % SOLN 1 drop, Both Eyes, Daily at bedtime   finasteride (PROSCAR) 5 MG tablet TAKE 1 TABLET BY MOUTH DAILY   insulin lispro (HUMALOG) 100 UNIT/ML KwikPen Subcutaneous, Daily, Pt taking 4 units in AM, 12 units at noon    Levemir FlexTouch 30 Units, Subcutaneous, Every evening   metFORMIN (GLUCOPHAGE) 500 mg, Oral, Daily with breakfast   metoprolol succinate (TOPROL XL) 100  mg, Oral, 2 times daily, Take with or immediately following a meal.   Multiple Vitamin (MULTI-VITAMINS) TABS 1 tablet, Oral, 2 times daily   pioglitazone (ACTOS) 15 mg, Oral, Daily   SIMBRINZA 1-0.2 % SUSP 1 drop, Both Eyes, 3 times daily   tamsulosin (FLOMAX) 0.4 mg, Oral, Daily    Social History:  The patient  reports that he quit smoking about 22 years ago. His smoking use included cigarettes. He has a 30.00 pack-year smoking history. He has never used smokeless tobacco. He reports that he does not drink alcohol and does not use drugs.   Family History:   The patient's family history includes Cancer in his maternal grandfather; Diabetes in his sister; Diabetes Mellitus II in his father; Heart Problems in his father; Heart disease in his father; Stroke in his mother and paternal grandmother.  ROS:  Please see the history of present illness. All other systems are reviewed and otherwise negative.   PHYSICAL EXAM:  VS:  BP 130/72 (BP Location: Left Arm, Patient Position: Sitting, Cuff Size: Normal)   Pulse (!) 160   Ht 5\' 8"  (1.727 m)   Wt 135 lb (61.2 kg)   SpO2 99%   BMI 20.53 kg/m  BMI: Body mass index is 20.53 kg/m.  GEN- The patient is well appearing, alert and oriented x 3 today.   Lungs- Clear to ausculation bilaterally, normal work of breathing.  Heart- Irregularly irregular, tachycardic rhythm, no murmurs, rubs or gallops Extremities- No peripheral edema, warm, dry   EKG is ordered. Personal review of EKG from today shows:  Afib w RVR, rate 160 Rare PVC  Recent Labs: 01/02/2022: ALT 23; TSH 2.829 02/21/2022: BUN 24; Creatinine, Ser 1.46; Hemoglobin 11.9; Magnesium 2.1; Platelets 403; Potassium 4.6; Sodium 133  07/02/2021: Cholesterol 138; HDL 60; LDL Cholesterol (Calc) 57; Total CHOL/HDL Ratio 2.3; Triglycerides 127   CrCl cannot be calculated (Patient's most recent lab result is older than the maximum 21 days allowed.).   Wt Readings from Last 3 Encounters:  07/01/22  135 lb (61.2 kg)  04/02/22 145 lb (65.8 kg)  03/07/22 139 lb 8 oz (63.3 kg)     Additional studies reviewed include: Previous EP, cardiology notes.   TTE, 01/30/2022  1. Left ventricular ejection fraction, by estimation, is 50 to 55%. The left ventricle has low normal function. The left ventricle has no regional wall motion abnormalities. Left ventricular diastolic parameters are indeterminate.   2. Right ventricular systolic function is normal. The right ventricular size is normal. There is normal pulmonary artery systolic pressure. The estimated right ventricular systolic pressure is 34.2 mmHg.   3. Right atrial size was mildly dilated.   4. The mitral valve is normal in structure. Mild mitral valve regurgitation. No evidence of mitral stenosis.   5. Tricuspid valve regurgitation is moderate.   6. The aortic valve is normal in structure. Aortic valve regurgitation is not visualized. Aortic valve sclerosis/calcification is present, without any evidence of aortic stenosis.   7. The inferior vena cava is  normal in size with greater than 50% respiratory variability, suggesting right atrial pressure of 3 mmHg.   8. Rhythm is atrial fibrillation rate 90 to 130 bpm    ASSESSMENT AND PLAN:  #) persis AFib (? Perm) #) HTN Afib w RVR today in clinic but feels very well without symptoms Most recent DCCV maintained NSR for ~3-4 days per patient Per previous OV with Dr. Lalla Brothers, patient preferred rate-control strategy only Home BP readings are on the lower side, but query accuracy with Afib Stop lopressor and irbesartan Start toprol 100mg  BID Consider amiodarone if ventricular rates are not well-controlled Strict ER precautions reviewed Close follow-up scheduled   #) Hypercoag d/t AFib CHA2DS2-VASc Score = 5 [CHF History: 0, HTN History: 1, Diabetes History: 1, Stroke History: 0, Vascular Disease History: 1, Age Score: 2, Gender Score: 0].  Therefore, the patient's annual risk of stroke is  7.2 %. NOAC - eliquis 5mg  BID, appropriately dosed  No bleeding concerns      Current medicines are reviewed at length with the patient today.   The patient does not have concerns regarding his medicines.  The following changes were made today:   STOP lopressor STOP irbesartan START Toprol 100mg  BID  Labs/ tests ordered today include:  Orders Placed This Encounter  Procedures   EKG 12-Lead     Disposition: Follow up with EP APP in in 4 weeks   Signed, Sherie Don, NP  07/01/22  9:57 AM  Electrophysiology [facility]

## 2022-07-01 ENCOUNTER — Encounter: Payer: Self-pay | Admitting: Cardiology

## 2022-07-01 ENCOUNTER — Ambulatory Visit: Payer: Medicare Other | Attending: Cardiology | Admitting: Cardiology

## 2022-07-01 VITALS — BP 130/72 | HR 160 | Ht 68.0 in | Wt 135.0 lb

## 2022-07-01 DIAGNOSIS — I4811 Longstanding persistent atrial fibrillation: Secondary | ICD-10-CM

## 2022-07-01 DIAGNOSIS — D6869 Other thrombophilia: Secondary | ICD-10-CM

## 2022-07-01 DIAGNOSIS — I4819 Other persistent atrial fibrillation: Secondary | ICD-10-CM

## 2022-07-01 DIAGNOSIS — I10 Essential (primary) hypertension: Secondary | ICD-10-CM

## 2022-07-01 MED ORDER — METOPROLOL SUCCINATE ER 100 MG PO TB24: 100.0000 mg | ORAL_TABLET | Freq: Two times a day (BID) | ORAL | 0 refills | Status: DC

## 2022-07-01 NOTE — Patient Instructions (Addendum)
Medication Instructions:  STOP irbesartan STOP Lopressor (metoprolol tartrate) START Toprol XL (metoprolol succinate) 100 mg by mouth twice a day  *If you need a refill on your cardiac medications before your next appointment, please call your pharmacy*  Lab Work: No labs ordered  If you have labs (blood work) drawn today and your tests are completely normal, you will receive your results only by: MyChart Message (if you have MyChart) OR A paper copy in the mail If you have any lab test that is abnormal or we need to change your treatment, we will call you to review the results.   Testing/Procedures: No testing ordered  Follow-Up: At [facility], you and your health needs are our priority.  As part of our continuing mission to provide you with exceptional heart care, we have created designated Provider Care Teams.  These Care Teams include your primary Cardiologist (physician) and Advanced Practice Providers (APPs -  Physician Assistants and Nurse Practitioners) who all work together to provide you with the care you need, when you need it.  We recommend signing up for the patient portal called "MyChart".  Sign up information is provided on this After Visit Summary.  MyChart is used to connect with patients for Virtual Visits (Telemedicine).  Patients are able to view lab/test results, encounter notes, upcoming appointments, etc.  Non-urgent messages can be sent to your provider as well.   To learn more about what you can do with MyChart, go to [email or web address].    Your next appointment:   1 month(s)  Provider:   Sherie Don, NP

## 2022-07-03 ENCOUNTER — Ambulatory Visit (INDEPENDENT_AMBULATORY_CARE_PROVIDER_SITE_OTHER): Payer: Medicare Other | Admitting: Nurse Practitioner

## 2022-07-03 ENCOUNTER — Encounter: Payer: Self-pay | Admitting: Nurse Practitioner

## 2022-07-03 VITALS — BP 118/68 | HR 99 | Temp 97.9°F | Resp 16 | Ht 68.0 in | Wt 137.9 lb

## 2022-07-03 DIAGNOSIS — D649 Anemia, unspecified: Secondary | ICD-10-CM | POA: Diagnosis not present

## 2022-07-03 DIAGNOSIS — E1165 Type 2 diabetes mellitus with hyperglycemia: Secondary | ICD-10-CM

## 2022-07-03 DIAGNOSIS — I4819 Other persistent atrial fibrillation: Secondary | ICD-10-CM

## 2022-07-03 DIAGNOSIS — I10 Essential (primary) hypertension: Secondary | ICD-10-CM

## 2022-07-03 DIAGNOSIS — N1832 Chronic kidney disease, stage 3b: Secondary | ICD-10-CM

## 2022-07-03 DIAGNOSIS — D473 Essential (hemorrhagic) thrombocythemia: Secondary | ICD-10-CM

## 2022-07-03 DIAGNOSIS — E782 Mixed hyperlipidemia: Secondary | ICD-10-CM

## 2022-07-03 DIAGNOSIS — I708 Atherosclerosis of other arteries: Secondary | ICD-10-CM

## 2022-07-03 DIAGNOSIS — Z794 Long term (current) use of insulin: Secondary | ICD-10-CM

## 2022-07-03 DIAGNOSIS — I700 Atherosclerosis of aorta: Secondary | ICD-10-CM

## 2022-07-03 NOTE — Assessment & Plan Note (Signed)
He is followed by Dr. Tedd Sias, but has missed his follow up appointment. He has rescheduled it and it is not till June. Will get labs today. He currently takes Levemir 30 units every night, Humalog 6 units before breakfast and 10 units before supper he is also on Actos 15 mg daily and metformin 1000 mg daily.

## 2022-07-03 NOTE — Assessment & Plan Note (Signed)
Takes atorvastatin 40 mg daily.

## 2022-07-03 NOTE — Assessment & Plan Note (Signed)
He does currently take atorvastatin 40 mg daily. 

## 2022-07-03 NOTE — Progress Notes (Signed)
BP 118/68   Pulse 99   Temp 97.9 F (36.6 C) (Oral)   Resp 16   Ht 5\' 8"  (1.727 m)   Wt 137 lb 14.4 oz (62.6 kg)   SpO2 99%   BMI 20.97 kg/m    Subjective:    Patient ID: Garrett Camp., male    DOB: Dec 20, 1945, 77 y.o.   MRN: [id number]  HPI: Garrett Yoder. is a 77 y.o. male  Chief Complaint  Patient presents with   Diabetes   Hyperlipidemia   Hypertension   Type 2 diabetes: patient is followed by Dr. Tedd Sias for his diabetes.  He reports his blood sugars have been running 79-300.  His last A1C was 10.1 on 03/06/2022.  Denies any polyuria, polyphagia or polydipsia.  He currently takes Levemir 30 units every night, Humalog 6 units before breakfast and 10 units before supper he is also on Actos 15 mg daily and metformin 1000 mg daily. He had an appointment scheduled with endo but had to cancel. Encouraged him to reschedule. He says he has an appointment in June.  He is past due for labs, will get labs today.   Hyperlipidemia: Takes atorvastatin 40 mg daily.  He denies any myalgia.  His last LDL was 57 on 07/02/2021.  He is due for labs today.  Hypertension: Currently takes metoprolol 100 mg BID.  His blood pressure today is 118/68.  Denies any chest pain, blurred vision, shortness of breath or headaches.  Chronic kidney disease: Last GFR was 48 on 03/06/2022.  He does not see a nephrologist and prefers not to.  He is aware to that he needs to avoid NSAIDs.   Aortic atherosclerosis/aortic atherosclerosis/aortic-iliac atherosclerosis: As noted on CT chest in 2019.  He does currently take atorvastatin 40 mg daily.  Anemia/ thrombocythemia: He last saw oncology/hematology on 05/27/2021. Plan from hem/onc was 1.  Thrombocytosis: Platelet counts fluctuate between 400-500 without a clear increasing trend.  Myeloproliferative work-up including a bone marrow biopsy has been negative in the past.  Continue to monitor. 2.  Normocytic anemia: Presently his hemoglobin is stable between  11-12.  He also has an element of chronic kidney disease with a creatinine of 1.6.  He does not require any EPO at this time. Consider if hemoglobin <10. Patient reports no changes.   Afib: patient is established with cardiology. His last appointment was on 07/01/2022. He currently takes metoprolol 100 mg BID and eliquis 5 mg BID. He reports he is feeling good. He has another appointment with cardiology in two weeks.   Relevant past medical, surgical, family and social history reviewed and updated as indicated. Interim medical history since our last visit reviewed. Allergies and medications reviewed and updated.  Review of Systems  Constitutional: Negative for fever or weight change.  Respiratory: Negative for cough and shortness of breath.   Cardiovascular: Negative for chest pain or palpitations.  Gastrointestinal: Negative for abdominal pain, no bowel changes.  Musculoskeletal: Negative for gait problem or joint swelling.  Skin: Negative for rash.  Neurological: Negative for dizziness or headache.  No other specific complaints in a complete review of systems (except as listed in HPI above).      Objective:    BP 118/68   Pulse 99   Temp 97.9 F (36.6 C) (Oral)   Resp 16   Ht 5\' 8"  (1.727 m)   Wt 137 lb 14.4 oz (62.6 kg)   SpO2 99%   BMI 20.97 kg/m  Wt Readings from Last 3 Encounters:  07/03/22 137 lb 14.4 oz (62.6 kg)  07/01/22 135 lb (61.2 kg)  04/02/22 145 lb (65.8 kg)    Physical Exam  Constitutional: Patient appears well-developed and well-nourished.  No distress.  HEENT: head atraumatic, normocephalic, pupils equal and reactive to light,  neck supple Cardiovascular: tachycardic rate, irregular rhythm and abnormal heart sounds.  No murmur heard. No BLE edema. Pulmonary/Chest: Effort normal and breath sounds normal. No respiratory distress. Abdominal: Soft.  There is no tenderness. Psychiatric: Patient has a normal mood and affect. behavior is normal. Judgment and  thought content normal.  Diabetic Foot Exam - Simple   Simple Foot Form Diabetic Foot exam was performed with the following findings: Yes 07/03/2022  1:14 PM  Visual Inspection No deformities, no ulcerations, no other skin breakdown bilaterally: Yes Sensation Testing Intact to touch and monofilament testing bilaterally: Yes Pulse Check Posterior Tibialis and Dorsalis pulse intact bilaterally: Yes Comments       Results for orders placed or performed during the hospital encounter of 02/27/22  Glucose, capillary  Result Value Ref Range   Glucose-Capillary 260 (H) 70 - 99 mg/dL      Assessment & Plan:   Problem List Items Addressed This Visit       Cardiovascular and Mediastinum   Essential hypertension, benign    Currently takes metoprolol 100 mg BID.      Relevant Orders   CBC with Differential/Platelet   COMPLETE METABOLIC PANEL WITH GFR   Aortic atherosclerosis (HCC)     He does currently take atorvastatin 40 mg daily.      Relevant Orders   Lipid panel   Aorto-iliac atherosclerosis (HCC)     He does currently take atorvastatin 40 mg daily.      Persistent atrial fibrillation [facility])    patient is established with cardiology. His last appointment was on 07/01/2022. He currently takes metoprolol 100 mg BID and eliquis 5 mg BID. He reports he is feeling good. He has another appointment with cardiology in two weeks.         Endocrine   Type 2 diabetes mellitus with hyperglycemia, with long-term current use of insulin (HCC)    He is followed by Dr. Tedd Sias, but has missed his follow up appointment. He has rescheduled it and it is not till June. Will get labs today. He currently takes Levemir 30 units every night, Humalog 6 units before breakfast and 10 units before supper he is also on Actos 15 mg daily and metformin 1000 mg daily.      Relevant Orders   Hemoglobin A1c   HM DIABETES FOOT EXAM (Completed)   Microalbumin / creatinine urine ratio   COMPLETE METABOLIC  PANEL WITH GFR     Genitourinary   Chronic kidney disease, stage 3b (HCC)    Avoid nsaids, try and get blood sugar better controlled.       Relevant Orders   COMPLETE METABOLIC PANEL WITH GFR     Hematopoietic and Hemostatic   Essential (hemorrhagic) thrombocythemia (HCC)    Managed by hematology        Other   Hyperlipidemia - Primary    Takes atorvastatin 40 mg daily.       Relevant Orders   COMPLETE METABOLIC PANEL WITH GFR   Lipid panel   Anemia    Managed by hematology      Relevant Orders   CBC with Differential/Platelet     Follow up plan: Return in about  6 months (around 01/03/2023) for follow up.

## 2022-07-03 NOTE — Assessment & Plan Note (Signed)
Currently takes metoprolol 100 mg BID.

## 2022-07-03 NOTE — Assessment & Plan Note (Signed)
Managed by hematology 

## 2022-07-03 NOTE — Assessment & Plan Note (Signed)
He does currently take atorvastatin 40 mg daily.

## 2022-07-03 NOTE — Assessment & Plan Note (Signed)
Avoid nsaids, try and get blood sugar better controlled.

## 2022-07-03 NOTE — Assessment & Plan Note (Signed)
patient is established with cardiology. His last appointment was on 07/01/2022. He currently takes metoprolol 100 mg BID and eliquis 5 mg BID. He reports he is feeling good. He has another appointment with cardiology in two weeks.

## 2022-07-04 LAB — LIPID PANEL
Cholesterol: 124 mg/dL (ref <200)
HDL: 49 mg/dL (ref >=40)
LDL Cholesterol (Calc): 52 mg/dL (calc)
Non-HDL Cholesterol (Calc): 75 mg/dL (calc) (ref <130)
Total CHOL/HDL Ratio: 2.5 (calc) (ref <5.0)
Triglycerides: 146 mg/dL (ref <150)

## 2022-07-04 LAB — COMPLETE METABOLIC PANEL WITH GFR
AG Ratio: 2 (calc) (ref 1.0–2.5)
ALT: 23 U/L (ref 9–46)
AST: 18 U/L (ref 10–35)
Albumin: 4.8 g/dL (ref 3.6–5.1)
Alkaline phosphatase (APISO): 98 U/L (ref 35–144)
BUN/Creatinine Ratio: 20 (calc) (ref 6–22)
BUN: 29 mg/dL — ABNORMAL HIGH (ref 7–25)
CO2: 26 mmol/L (ref 20–32)
Calcium: 10 mg/dL (ref 8.6–10.3)
Chloride: 102 mmol/L (ref 98–110)
Creat: 1.46 mg/dL — ABNORMAL HIGH (ref 0.70–1.28)
Globulin: 2.4 g/dL (calc) (ref 1.9–3.7)
Glucose, Bld: 148 mg/dL — ABNORMAL HIGH (ref 65–99)
Potassium: 4.7 mmol/L (ref 3.5–5.3)
Sodium: 140 mmol/L (ref 135–146)
Total Bilirubin: 0.4 mg/dL (ref 0.2–1.2)
Total Protein: 7.2 g/dL (ref 6.1–8.1)
eGFR: 50 mL/min/{1.73_m2} — ABNORMAL LOW (ref >=60)

## 2022-07-04 LAB — CBC WITH DIFFERENTIAL/PLATELET
Absolute Monocytes: 608 cells/uL (ref 200–950)
Basophils Absolute: 31 cells/uL (ref 0–200)
Basophils Relative: 0.4 %
Eosinophils Absolute: 86 cells/uL (ref 15–500)
Eosinophils Relative: 1.1 %
HCT: 34.9 % — ABNORMAL LOW (ref 38.5–50.0)
Hemoglobin: 11.7 g/dL — ABNORMAL LOW (ref 13.2–17.1)
Lymphs Abs: 1755 cells/uL (ref 850–3900)
MCH: 31.1 pg (ref 27.0–33.0)
MCHC: 33.5 g/dL (ref 32.0–36.0)
MCV: 92.8 fL (ref 80.0–100.0)
MPV: 9.1 fL (ref 7.5–12.5)
Monocytes Relative: 7.8 %
Neutro Abs: 5320 cells/uL (ref 1500–7800)
Neutrophils Relative %: 68.2 %
Platelets: 485 10*3/uL — ABNORMAL HIGH (ref 140–400)
RBC: 3.76 10*6/uL — ABNORMAL LOW (ref 4.20–5.80)
RDW: 12.6 % (ref 11.0–15.0)
Total Lymphocyte: 22.5 %
WBC: 7.8 10*3/uL (ref 3.8–10.8)

## 2022-07-04 LAB — HEMOGLOBIN A1C
Hgb A1c MFr Bld: 8.8 % of total Hgb — ABNORMAL HIGH (ref <5.7)
Mean Plasma Glucose: 206 mg/dL
eAG (mmol/L): 11.4 mmol/L

## 2022-07-05 LAB — MICROALBUMIN / CREATININE URINE RATIO
Creatinine, Urine: 122 mg/dL (ref 20–320)
Microalb Creat Ratio: 13 mg/g{creat}
Microalb, Ur: 1.6 mg/dL

## 2022-07-10 NOTE — Progress Notes (Signed)
Cardiology Office Note Date:  07/10/2022  Patient ID:  Garrett Elmendorf., DOB [birth year], MRN [id number] PCP:  Berniece Salines, FNP  Cardiologist:  Lorine Bears, MD Electrophysiologist: Lanier Prude, MD    Chief Complaint: Afib follow-up  History of Present Illness: Garrett Kata. is a 77 y.o. male with PMH notable for persis AFib, CAD, HTN; seen today for Lanier Prude, MD for routine electrophysiology followup.  Diagnosed with afib 12/2021, DCCV 02/27/22 with return of AF on 1/16.  Last saw Dr. Lalla Brothers 03/2022, was asymptomatic from an A-fib perspective and so rate control was agreed upon.  I saw him ~2 weeks ago and he was noted to be in AFib w RVR up to 160, asymptomatic. BP readings borderline low at home, but normotensive in office. Losartan stopped, lopressor > toprol.   Today, he tells me that he continues to feel well. Has noticed some improvement in energy - does not feel quite as tired after walking [place].  Has home BP and pulse log. BPs running 100-110s/60-70s; HR 90-120s, mostly 100+ Diligently takes eliquis BID, no bleeding concerns  No CP, palpitations, chest pressure.  Appetite reduced from several months ago, but still eats well.  No swelling.   Past Medical History:  Diagnosis Date   Anemia 08/14/2015   Anxiety    Aortic atherosclerosis (HCC) 11/25/2016   Chest CT Oct 2018   Aorto-iliac atherosclerosis (HCC) 11/25/2016   Abd/pelvic CT Oct 2018   Coronary artery calcification seen on CAT scan 11/25/2016   Chest CT Oct 2018; refer to Dr. Kirke Corin   DDD (degenerative disc disease), cervical 08/21/2016   Depression    Diabetes mellitus without complication (HCC)    Facet arthropathy, cervical 08/21/2016   GERD (gastroesophageal reflux disease)    Heart murmur    As child   History of echocardiogram    a. 01/2022 Echo: EF 50-55%, no rwma, nl RV fxn w/ RVSP 34.60mmHg, Mildly dil RA. Mild MR. Mod TR. AoV sclerosis w/o stenosis.   History of stress  test    a. 01/2017 Ex MV: Poor ex capacity - held in stage I. 1mm Inflat ST depression. No ischemia/infarct. EF 55-65%.   Hyperlipidemia    Hypertension    Hypertrophy of prostate with urinary obstruction 11/25/2016   Pelvic CT October 2018   Incidental lung nodule, > 3mm and < 8mm 11/25/2016   5 mm nodule LLL, chest CT Oct 2018   Persistent atrial fibrillation (HCC)    a. 02/2022 Dx in 01/2022-->CHA2DS2VASc = 5-->Eliquis; b. 02/2022 s/p DCCV (150J->200J->RSR).   Thrombocytosis 08/14/2015    Past Surgical History:  Procedure Laterality Date   ANTERIOR CERVICAL DECOMP/DISCECTOMY FUSION N/A 09/24/2016   Procedure: ANTERIOR CERVICAL DECOMPRESSION/DISCECTOMY FUSION 3 LEVELS-C4-7;  Surgeon: Venetia Night, MD;  Location: [facility];  Service: Neurosurgery;  Laterality: N/A;   CARDIOVERSION N/A 02/27/2022   Procedure: CARDIOVERSION;  Surgeon: Antonieta Iba, MD;  Location: [facility];  Service: Cardiovascular;  Laterality: N/A;   CATARACT EXTRACTION     ELECTROMAGNETIC NAVIGATION BROCHOSCOPY Right 04/07/2018   Procedure: ELECTROMAGNETIC NAVIGATION BRONCHOSCOPY;  Surgeon: Erin Fulling, MD;  Location: [facility];  Service: Cardiopulmonary;  Laterality: Right;   EYE SURGERY Right    Cataract Extraction with IOL   RETINAL LASER PROCEDURE      Current Outpatient Medications  Medication Instructions   acetaminophen (TYLENOL) 500 mg, Oral, 2 times daily PRN   apixaban (ELIQUIS) 5 mg, Oral, 2 times daily   atorvastatin (LIPITOR)  40 mg, Oral, Daily   bimatoprost (LUMIGAN) 0.01 % SOLN 1 drop, Both Eyes, Daily at bedtime   finasteride (PROSCAR) 5 MG tablet TAKE 1 TABLET BY MOUTH DAILY   insulin lispro (HUMALOG) 100 UNIT/ML KwikPen Subcutaneous, Daily, Pt taking 4 units in AM, 12 units at noon    Levemir FlexTouch 30 Units, Subcutaneous, Every evening   metFORMIN (GLUCOPHAGE) 500 mg, Oral, Daily with breakfast   metoprolol succinate (TOPROL XL) 100 mg, Oral, 2 times daily, Take with or immediately  following a meal.   Multiple Vitamin (MULTI-VITAMINS) TABS 1 tablet, Oral, 2 times daily   pioglitazone (ACTOS) 15 mg, Oral, Daily   SIMBRINZA 1-0.2 % SUSP 1 drop, Both Eyes, 3 times daily   tamsulosin (FLOMAX) 0.4 mg, Oral, Daily    Social History:  The patient  reports that he quit smoking about 22 years ago. His smoking use included cigarettes. He has a 30.00 pack-year smoking history. He has never used smokeless tobacco. He reports that he does not drink alcohol and does not use drugs.   Family History:   The patient's family history includes Cancer in his maternal grandfather; Diabetes in his sister; Diabetes Mellitus II in his father; Heart Problems in his father; Heart disease in his father; Stroke in his mother and paternal grandmother.  ROS:  Please see the history of present illness. All other systems are reviewed and otherwise negative.   PHYSICAL EXAM:  VS:  There were no vitals taken for this visit. BMI: There is no height or weight on file to calculate BMI.  GEN- The patient is well appearing, alert and oriented x 3 today.   Lungs- Clear to ausculation bilaterally, normal work of breathing.  Heart- Irregularly irregular, tachycardic rhythm, no murmurs, rubs or gallops Extremities- No peripheral edema, warm, dry   EKG is ordered. Personal review of EKG from today shows: AFib w RVR, rate 117, rare PVC  Recent Labs: 01/02/2022: TSH 2.829 02/21/2022: Magnesium 2.1 07/03/2022: ALT 23; BUN 29; Creat 1.46; Hemoglobin 11.7; Platelets 485; Potassium 4.7; Sodium 140  07/03/2022: Cholesterol 124; HDL 49; LDL Cholesterol (Calc) 52; Total CHOL/HDL Ratio 2.5; Triglycerides 146   Estimated Creatinine Clearance: 38.1 mL/min (A) (by C-G formula based on SCr of 1.46 mg/dL (H)).   Wt Readings from Last 3 Encounters:  07/03/22 137 lb 14.4 oz (62.6 kg)  07/01/22 135 lb (61.2 kg)  04/02/22 145 lb (65.8 kg)     Additional studies reviewed include: Previous EP, cardiology notes.   TTE,  01/30/2022  1. Left ventricular ejection fraction, by estimation, is 50 to 55%. The left ventricle has low normal function. The left ventricle has no regional wall motion abnormalities. Left ventricular diastolic parameters are indeterminate.   2. Right ventricular systolic function is normal. The right ventricular size is normal. There is normal pulmonary artery systolic pressure. The estimated right ventricular systolic pressure is 34.2 mmHg.   3. Right atrial size was mildly dilated.   4. The mitral valve is normal in structure. Mild mitral valve regurgitation. No evidence of mitral stenosis.   5. Tricuspid valve regurgitation is moderate.   6. The aortic valve is normal in structure. Aortic valve regurgitation is not visualized. Aortic valve sclerosis/calcification is present, without any evidence of aortic stenosis.   7. The inferior vena cava is normal in size with greater than 50% respiratory variability, suggesting right atrial pressure of 3 mmHg.   8. Rhythm is atrial fibrillation rate 90 to 130 bpm    ASSESSMENT  AND PLAN:  #) persis AFib (? Perm) #) HTN Remains in Afib w RVR, though considerably lower ventricular rate than previous OV Continues to feel well without symptoms BP well-controlled on home log off irbesartan, no plans to resume Increase toprol to 150mg  BID - consider amiodarone for rate control    #) Hypercoag d/t AFib CHA2DS2-VASc Score = 5 [CHF History: 0, HTN History: 1, Diabetes History: 1, Stroke History: 0, Vascular Disease History: 1, Age Score: 2, Gender Score: 0].  Therefore, the patient's annual risk of stroke is 7.2 %. NOAC - eliquis 5mg  BID, appropriately dosed  No bleeding concerns      Current medicines are reviewed at length with the patient today.   The patient does not have concerns regarding his medicines.  The following changes were made today:   INCREASE Toprol 150mg  BID  Labs/ tests ordered today include:  No orders of the defined types were  placed in this encounter.    Disposition: Follow up with EP APP in in 4 weeks   Signed, Sherie Don, NP  07/10/22  3:43 PM  Electrophysiology [facility]

## 2022-07-15 ENCOUNTER — Ambulatory Visit: Payer: Medicare Other | Attending: Cardiology | Admitting: Cardiology

## 2022-07-15 ENCOUNTER — Encounter: Payer: Self-pay | Admitting: Cardiology

## 2022-07-15 VITALS — BP 110/80 | HR 117 | Ht 68.0 in | Wt 135.0 lb

## 2022-07-15 DIAGNOSIS — D6869 Other thrombophilia: Secondary | ICD-10-CM

## 2022-07-15 DIAGNOSIS — I4811 Longstanding persistent atrial fibrillation: Secondary | ICD-10-CM | POA: Diagnosis not present

## 2022-07-15 DIAGNOSIS — I10 Essential (primary) hypertension: Secondary | ICD-10-CM | POA: Diagnosis not present

## 2022-07-15 DIAGNOSIS — I4819 Other persistent atrial fibrillation: Secondary | ICD-10-CM

## 2022-07-15 MED ORDER — METOPROLOL SUCCINATE ER 100 MG PO TB24: ORAL_TABLET | ORAL | 0 refills | Status: DC

## 2022-07-15 NOTE — Patient Instructions (Signed)
Medication Instructions:  INCREASE Toprol to 150 mg by mouth twice day  *If you need a refill on your cardiac medications before your next appointment, please call your pharmacy*   Lab Work: No labs ordered  If you have labs (blood work) drawn today and your tests are completely normal, you will receive your results only by: MyChart Message (if you have MyChart) OR A paper copy in the mail If you have any lab test that is abnormal or we need to change your treatment, we will call you to review the results.   Testing/Procedures: No testing ordered   Follow-Up: At [facility], you and your health needs are our priority.  As part of our continuing mission to provide you with exceptional heart care, we have created designated Provider Care Teams.  These Care Teams include your primary Cardiologist (physician) and Advanced Practice Providers (APPs -  Physician Assistants and Nurse Practitioners) who all work together to provide you with the care you need, when you need it.  We recommend signing up for the patient portal called "MyChart".  Sign up information is provided on this After Visit Summary.  MyChart is used to connect with patients for Virtual Visits (Telemedicine).  Patients are able to view lab/test results, encounter notes, upcoming appointments, etc.  Non-urgent messages can be sent to your provider as well.   To learn more about what you can do with MyChart, go to [email or web address].    Your next appointment:   4 week(s)  Provider:   Sherie Don, NP

## 2022-07-18 ENCOUNTER — Other Ambulatory Visit: Payer: Self-pay

## 2022-07-21 ENCOUNTER — Inpatient Hospital Stay: Payer: Medicare Other | Admitting: Oncology

## 2022-07-21 ENCOUNTER — Inpatient Hospital Stay: Payer: Medicare Other

## 2022-07-28 ENCOUNTER — Ambulatory Visit: Payer: Medicare Other | Admitting: Cardiology

## 2022-08-05 ENCOUNTER — Inpatient Hospital Stay: Payer: Medicare Other

## 2022-08-05 ENCOUNTER — Inpatient Hospital Stay: Payer: Medicare Other | Admitting: Oncology

## 2022-08-11 NOTE — Progress Notes (Unsigned)
Cardiology Office Note Date:  07/10/2022  Patient ID:  Garrett Yoder., DOB Sep 14, 1945, MRN [id number] PCP:  Berniece Salines, FNP  Cardiologist:  Lorine Bears, MD Electrophysiologist: Lanier Prude, MD    Chief Complaint: Afib follow-up  History of Present Illness: Garrett Yoder. is a 77 y.o. male with PMH notable for persis AFib, CAD, HTN; seen today for Lanier Prude, MD for routine electrophysiology followup.  Diagnosed with afib 12/2021, DCCV 02/27/22 with return of AF on 1/16.  Last saw Dr. Lalla Brothers 03/2022, was asymptomatic from an A-fib perspective and so rate control was agreed upon.  I saw him mid-May 2024 and he was noted to be in AFib w RVR up to 160, asymptomatic. BP readings borderline low at home, but normotensive in office. Losartan stopped, lopressor > toprol.  I again saw him late May, has some improvement in energy, remained tachycardic, so toprol further up-titrated.   He continues to feel well. Brings vital sign log from home.  BP readings 100-120s. Pulse 80-100s.  Denies chest pain, palpitations. Continues to feel well walking dog ([place]). Diligently takes eliquis BID, no bleeding concerns.    He denies chest pain, palpitations, dyspnea, PND, orthopnea, nausea, vomiting, dizziness, syncope, edema, weight gain, or early satiety.     Past Medical History:  Diagnosis Date   Anemia 08/14/2015   Anxiety    Aortic atherosclerosis (HCC) 11/25/2016   Chest CT Oct 2018   Aorto-iliac atherosclerosis (HCC) 11/25/2016   Abd/pelvic CT Oct 2018   Coronary artery calcification seen on CAT scan 11/25/2016   Chest CT Oct 2018; refer to Dr. Kirke Corin   DDD (degenerative disc disease), cervical 08/21/2016   Depression    Diabetes mellitus without complication (HCC)    Facet arthropathy, cervical 08/21/2016   GERD (gastroesophageal reflux disease)    Heart murmur    As child   History of echocardiogram    a. 01/2022 Echo: EF 50-55%, no rwma, nl RV fxn w/  RVSP 34.97mmHg, Mildly dil RA. Mild MR. Mod TR. AoV sclerosis w/o stenosis.   History of stress test    a. 01/2017 Ex MV: Poor ex capacity - held in stage I. 1mm Inflat ST depression. No ischemia/infarct. EF 55-65%.   Hyperlipidemia    Hypertension    Hypertrophy of prostate with urinary obstruction 11/25/2016   Pelvic CT October 2018   Incidental lung nodule, > 3mm and < 8mm 11/25/2016   5 mm nodule LLL, chest CT Oct 2018   Persistent atrial fibrillation (HCC)    a. 02/2022 Dx in 01/2022-->CHA2DS2VASc = 5-->Eliquis; b. 02/2022 s/p DCCV (150J->200J->RSR).   Thrombocytosis 08/14/2015    Past Surgical History:  Procedure Laterality Date   ANTERIOR CERVICAL DECOMP/DISCECTOMY FUSION N/A 09/24/2016   Procedure: ANTERIOR CERVICAL DECOMPRESSION/DISCECTOMY FUSION 3 LEVELS-C4-7;  Surgeon: Venetia Night, MD;  Location: [facility];  Service: Neurosurgery;  Laterality: N/A;   CARDIOVERSION N/A 02/27/2022   Procedure: CARDIOVERSION;  Surgeon: Antonieta Iba, MD;  Location: [facility];  Service: Cardiovascular;  Laterality: N/A;   CATARACT EXTRACTION     ELECTROMAGNETIC NAVIGATION BROCHOSCOPY Right 04/07/2018   Procedure: ELECTROMAGNETIC NAVIGATION BRONCHOSCOPY;  Surgeon: Erin Fulling, MD;  Location: [facility];  Service: Cardiopulmonary;  Laterality: Right;   EYE SURGERY Right    Cataract Extraction with IOL   RETINAL LASER PROCEDURE      Current Outpatient Medications  Medication Instructions   acetaminophen (TYLENOL) 500 mg, Oral, 2 times daily PRN   apixaban (ELIQUIS) 5  mg, Oral, 2 times daily   atorvastatin (LIPITOR) 40 mg, Oral, Daily   bimatoprost (LUMIGAN) 0.01 % SOLN 1 drop, Both Eyes, Daily at bedtime   finasteride (PROSCAR) 5 MG tablet TAKE 1 TABLET BY MOUTH DAILY   insulin lispro (HUMALOG) 100 UNIT/ML KwikPen Subcutaneous, Daily, Pt taking 4 units in AM, 12 units at noon    Levemir FlexTouch 30 Units, Subcutaneous, Every evening   metFORMIN (GLUCOPHAGE) 500 mg, Oral, Daily with  breakfast   metoprolol succinate (TOPROL XL) 100 mg, Oral, 2 times daily, Take with or immediately following a meal.   Multiple Vitamin (MULTI-VITAMINS) TABS 1 tablet, Oral, 2 times daily   pioglitazone (ACTOS) 15 mg, Oral, Daily   SIMBRINZA 1-0.2 % SUSP 1 drop, Both Eyes, 3 times daily   tamsulosin (FLOMAX) 0.4 mg, Oral, Daily    Social History:  The patient  reports that he quit smoking about 22 years ago. His smoking use included cigarettes. He has a 30.00 pack-year smoking history. He has never used smokeless tobacco. He reports that he does not drink alcohol and does not use drugs.   Family History:   The patient's family history includes Cancer in his maternal grandfather; Diabetes in his sister; Diabetes Mellitus II in his father; Heart Problems in his father; Heart disease in his father; Stroke in his mother and paternal grandmother.  ROS:  Please see the history of present illness. All other systems are reviewed and otherwise negative.   PHYSICAL EXAM:  VS:  BP 130/80 (BP Location: Left Arm, Patient Position: Sitting, Cuff Size: Normal)   Pulse 95   Ht 5\' 8"  (1.727 m)   Wt 133 lb (60.3 kg)   SpO2 98%   BMI 20.22 kg/m  BMI: There is no height or weight on file to calculate BMI.  GEN- The patient is well appearing, alert and oriented x 3 today.   Lungs- Clear to ausculation bilaterally, normal work of breathing.  Heart- Irregularly irregular, tachycardic rhythm, no murmurs, rubs or gallops Extremities- No peripheral edema, warm, dry   EKG is ordered. Personal review of EKG from today shows: AFib, rate 95bpm, rare PVC   Recent Labs: 01/02/2022: TSH 2.829 02/21/2022: Magnesium 2.1 07/03/2022: ALT 23; BUN 29; Creat 1.46; Hemoglobin 11.7; Platelets 485; Potassium 4.7; Sodium 140  07/03/2022: Cholesterol 124; HDL 49; LDL Cholesterol (Calc) 52; Total CHOL/HDL Ratio 2.5; Triglycerides 146   Estimated Creatinine Clearance: 38.1 mL/min (A) (by C-G formula based on SCr of 1.46 mg/dL  (H)).   Wt Readings from Last 3 Encounters:  07/03/22 137 lb 14.4 oz (62.6 kg)  07/01/22 135 lb (61.2 kg)  04/02/22 145 lb (65.8 kg)     Additional studies reviewed include: Previous EP, cardiology notes.   TTE, 01/30/2022  1. Left ventricular ejection fraction, by estimation, is 50 to 55%. The left ventricle has low normal function. The left ventricle has no regional wall motion abnormalities. Left ventricular diastolic parameters are indeterminate.   2. Right ventricular systolic function is normal. The right ventricular size is normal. There is normal pulmonary artery systolic pressure. The estimated right ventricular systolic pressure is 34.2 mmHg.   3. Right atrial size was mildly dilated.   4. The mitral valve is normal in structure. Mild mitral valve regurgitation. No evidence of mitral stenosis.   5. Tricuspid valve regurgitation is moderate.   6. The aortic valve is normal in structure. Aortic valve regurgitation is not visualized. Aortic valve sclerosis/calcification is present, without any evidence of aortic stenosis.  7. The inferior vena cava is normal in size with greater than 50% respiratory variability, suggesting right atrial pressure of 3 mmHg.   8. Rhythm is atrial fibrillation rate 90 to 130 bpm    ASSESSMENT AND PLAN:  #) perm AFib #) HTN Much better ventricular control, though still elevated at times Continues to feel well without symptoms BP well-controlled on home log off irbesartan, no plans to resume Continue toprol 150mg  BID   #) Hypercoag d/t AFib CHA2DS2-VASc Score = 5 [CHF History: 0, HTN History: 1, Diabetes History: 1, Stroke History: 0, Vascular Disease History: 1, Age Score: 2, Gender Score: 0].  Therefore, the patient's annual risk of stroke is 7.2 %. NOAC - eliquis 5mg  BID, appropriately dosed  No bleeding concerns      Current medicines are reviewed at length with the patient today.   The patient does not have concerns regarding his  medicines.  The following changes were made today:  none   Labs/ tests ordered today include:  Orders Placed This Encounter  Procedures   EKG 12-Lead     Disposition: Follow up with EP APP in 3 months   Signed, Sherie Don, NP  07/10/22  3:43 PM  Electrophysiology [facility]

## 2022-08-12 ENCOUNTER — Ambulatory Visit: Payer: Medicare Other | Attending: Cardiology | Admitting: Cardiology

## 2022-08-12 ENCOUNTER — Encounter: Payer: Self-pay | Admitting: Cardiology

## 2022-08-12 VITALS — BP 130/80 | HR 95 | Ht 68.0 in | Wt 133.0 lb

## 2022-08-12 DIAGNOSIS — I4819 Other persistent atrial fibrillation: Secondary | ICD-10-CM

## 2022-08-12 DIAGNOSIS — I4811 Longstanding persistent atrial fibrillation: Secondary | ICD-10-CM | POA: Diagnosis not present

## 2022-08-12 DIAGNOSIS — D6869 Other thrombophilia: Secondary | ICD-10-CM

## 2022-08-12 DIAGNOSIS — I10 Essential (primary) hypertension: Secondary | ICD-10-CM | POA: Diagnosis not present

## 2022-08-12 NOTE — Patient Instructions (Signed)
Medication Instructions:  Your physician recommends that you continue on your current medications as directed. Please refer to the Current Medication list given to you today.  *If you need a refill on your cardiac medications before your next appointment, please call your pharmacy*   Lab Work: No labs ordered  If you have labs (blood work) drawn today and your tests are completely normal, you will receive your results only by: MyChart Message (if you have MyChart) OR A paper copy in the mail If you have any lab test that is abnormal or we need to change your treatment, we will call you to review the results.   Testing/Procedures: No testing ordered  Follow-Up: At [facility] you and your health needs are our priority.  As part of our continuing mission to provide you with exceptional heart care, we have created designated Provider Care Teams.  These Care Teams include your primary Cardiologist (physician) and Advanced Practice Providers (APPs -  Physician Assistants and Nurse Practitioners) who all work together to provide you with the care you need, when you need it.  We recommend signing up for the patient portal called "MyChart".  Sign up information is provided on this After Visit Summary.  MyChart is used to connect with patients for Virtual Visits (Telemedicine).  Patients are able to view lab/test results, encounter notes, upcoming appointments, etc.  Non-urgent messages can be sent to your provider as well.   To learn more about what you can do with MyChart, go to [email or web address].    Your next appointment:   3-4 month(s)  Provider:   Sherie Don, NP

## 2022-08-24 ENCOUNTER — Other Ambulatory Visit: Payer: Self-pay | Admitting: Urology

## 2022-09-01 ENCOUNTER — Other Ambulatory Visit: Payer: Self-pay | Admitting: Cardiology

## 2022-09-20 ENCOUNTER — Other Ambulatory Visit: Payer: Self-pay | Admitting: Urology

## 2022-09-22 ENCOUNTER — Other Ambulatory Visit: Payer: Self-pay

## 2022-09-22 ENCOUNTER — Telehealth: Payer: Self-pay | Admitting: Cardiology

## 2022-09-22 MED ORDER — APIXABAN 5 MG PO TABS: 5.0000 mg | ORAL_TABLET | Freq: Two times a day (BID) | ORAL | 0 refills | Status: DC

## 2022-09-22 NOTE — Telephone Encounter (Signed)
Pt would like you to call and talk about med issues today. Eliquis 5 mg

## 2022-09-22 NOTE — Telephone Encounter (Signed)
Called patient, advised that he is in the donut hole for Eliquis. He states that he did speak with his insurance company and they can give him Xarelto cheaper, and Warfarin. We did discuss instructions for Warfarin- he did not want to do blood checks, he did question about the Xarelto, however I did explain this change would have to come from provider recommendations. He was provided with Eliquis samples, and patient assistance for Eliquis- I did advise patient to try to complete patient assistance if able, but would check about possibly switching if an option.   Patient verbalized understanding.

## 2022-09-23 NOTE — Telephone Encounter (Signed)
Patient is returning call. Transferred to [place], LPN.

## 2022-09-23 NOTE — Telephone Encounter (Signed)
Attempted to contact patient, phone died in the middle of the conversation. Attempted to contact patient after, but was unable to reach him. Will try again later.

## 2022-09-23 NOTE — Telephone Encounter (Signed)
Called patient, advised of message from NP.  Patient verbalized understanding. He is going to work on the patient assistance, use the samples provided, and will call with any questions/concerns. Aware if we need to switch medications he can, but he will update [place].

## 2022-09-26 ENCOUNTER — Ambulatory Visit: Payer: Medicare Other

## 2022-09-30 ENCOUNTER — Encounter: Payer: Medicare Other | Admitting: Nurse Practitioner

## 2022-10-01 ENCOUNTER — Other Ambulatory Visit: Payer: Self-pay | Admitting: Urology

## 2022-10-09 ENCOUNTER — Telehealth: Payer: Self-pay | Admitting: Nurse Practitioner

## 2022-10-09 NOTE — Telephone Encounter (Signed)
Copied from CRM [id number]. Topic: Medicare AWV >> Oct 09, 2022 11:30 AM Lennox Pippins wrote: Patient wanted his AWV over the telephone tomorrow 10/10/22, Patients # [phone], changed the status of the visit to telephone.

## 2022-10-10 ENCOUNTER — Ambulatory Visit (INDEPENDENT_AMBULATORY_CARE_PROVIDER_SITE_OTHER): Payer: Medicare Other

## 2022-10-10 VITALS — Ht 68.0 in | Wt 133.0 lb

## 2022-10-10 DIAGNOSIS — Z0000 Encounter for general adult medical examination without abnormal findings: Secondary | ICD-10-CM | POA: Diagnosis not present

## 2022-10-10 NOTE — Patient Instructions (Addendum)
Garrett Yoder , Thank you for taking time to come for your Medicare Wellness Visit. I appreciate your ongoing commitment to your health goals. Please review the following plan we discussed and let me know if I can assist you in the future.   Referrals/Orders/Follow-Ups/Clinician Recommendations: none  This is a list of the screening recommended for you and due dates:  Health Maintenance  Topic Date Due   Zoster (Shingles) Vaccine (1 of 2) 05/22/1965   DTaP/Tdap/Td vaccine (2 - Tdap) 06/29/2016   COVID-19 Vaccine (3 - Pfizer risk series) 07/01/2019   Eye exam for diabetics  09/13/2022   Flu Shot  09/18/2022   Hemoglobin A1C  01/03/2023   Yearly kidney function blood test for diabetes  07/03/2023   Complete foot exam   07/03/2023   Yearly kidney health urinalysis for diabetes  07/04/2023   Medicare Annual Wellness Visit  10/10/2023   Pneumonia Vaccine  Completed   HPV Vaccine  Aged Out   Colon Cancer Screening  Discontinued   Hepatitis C Screening  Discontinued    Advanced directives: (Provided) Advance directive discussed with you today. I have provided a copy for you to complete at home and have notarized. Once this is complete, please bring a copy in to our office so we can scan it into your chart. PT HAS THE PAPERWORK AND WILL COMPLETE SOON.  Next Medicare Annual Wellness Visit scheduled for next year: Yes 10/15/2023 @ 1PM telephone

## 2022-10-10 NOTE — Progress Notes (Signed)
Subjective:   Garrett Yoder. is a 77 y.o. male who presents for Medicare Annual/Subsequent preventive examination.  Visit Complete: Virtual  I connected with  [name]. on 10/10/22 by a audio enabled telemedicine application and verified that I am speaking with the correct person using two identifiers.  Patient Location: Home  Provider Location: Office/Clinic  I discussed the limitations of evaluation and management by telemedicine. The patient expressed understanding and agreed to proceed.  Vital Signs: Unable to obtain new vitals due to this being a telehealth visit.  Patient Medicare AWV questionnaire was completed by the patient on (not done); I have confirmed that all information answered by patient is correct and no changes since this date.  Review of Systems    Cardiac Risk Factors include: advanced age (>69men, >74 women);diabetes mellitus;dyslipidemia;hypertension;male gender    Objective:    Today's Vitals   10/10/22 1305  Weight: 133 lb (60.3 kg)  Height: 5\' 8"  (1.727 m)   Body mass index is 20.22 kg/m.     10/10/2022    1:19 PM 05/27/2021    2:20 PM 07/12/2020    4:12 PM 05/24/2020    1:50 PM 03/31/2019   11:28 AM 04/07/2018    9:07 AM 03/29/2018   10:27 AM  Advanced Directives  Does Patient Have a Medical Advance Directive? No No No No No No No  Would patient like information on creating a medical advance directive?  No - Patient declined No - Patient declined Yes (MAU/Ambulatory/Procedural Areas - Information given) Yes (MAU/Ambulatory/Procedural Areas - Information given) No - Patient declined No - Patient declined    Current Medications (verified) Outpatient Encounter Medications as of 10/10/2022  Medication Sig   acetaminophen (TYLENOL) 500 MG tablet Take 500 mg by mouth 2 (two) times daily as needed.   apixaban (ELIQUIS) 5 MG TABS tablet Take 1 tablet (5 mg total) by mouth 2 (two) times daily.   apixaban (ELIQUIS) 5 MG TABS tablet Take 1  tablet (5 mg total) by mouth 2 (two) times daily.   atorvastatin (LIPITOR) 40 MG tablet TAKE 1 TABLET BY MOUTH DAILY   bimatoprost (LUMIGAN) 0.01 % SOLN Place 1 drop into both eyes at bedtime.    finasteride (PROSCAR) 5 MG tablet TAKE 1 TABLET BY MOUTH DAILY   insulin detemir (LEVEMIR FLEXTOUCH) 100 UNIT/ML FlexPen Inject 30 Units into the skin every evening.   insulin lispro (HUMALOG) 100 UNIT/ML KwikPen Inject into the skin daily. Pt taking 4 units in AM, 12 units at noon   metFORMIN (GLUCOPHAGE) 1000 MG tablet Take 1,000 mg by mouth 2 (two) times daily with a meal.   metoprolol succinate (TOPROL-XL) 100 MG 24 hr tablet TAKE 1 TABLET BY MOUTH TWICE  DAILY . TAKE WITH OR IMMEDIATELY FOLLOWING A MEAL   Multiple Vitamin (MULTI-VITAMINS) TABS Take 1 tablet by mouth 2 (two) times daily.    pioglitazone (ACTOS) 15 MG tablet Take 15 mg by mouth daily.   SIMBRINZA 1-0.2 % SUSP Place 1 drop into both eyes 3 (three) times daily.    tamsulosin (FLOMAX) 0.4 MG CAPS capsule Take 1 capsule (0.4 mg total) by mouth daily.   No facility-administered encounter medications on file as of 10/10/2022.    Allergies (verified) Codeine   History: Past Medical History:  Diagnosis Date   Anemia 08/14/2015   Anxiety    Aortic atherosclerosis (HCC) 11/25/2016   Chest CT Oct 2018   Aorto-iliac atherosclerosis (HCC) 11/25/2016   Abd/pelvic CT Oct  2018   Coronary artery calcification seen on CAT scan 11/25/2016   Chest CT Oct 2018; refer to Dr. Kirke Corin   DDD (degenerative disc disease), cervical 08/21/2016   Depression    Diabetes mellitus without complication (HCC)    Facet arthropathy, cervical 08/21/2016   GERD (gastroesophageal reflux disease)    Heart murmur    As child   History of echocardiogram    a. 01/2022 Echo: EF 50-55%, no rwma, nl RV fxn w/ RVSP 34.6mmHg, Mildly dil RA. Mild MR. Mod TR. AoV sclerosis w/o stenosis.   History of stress test    a. 01/2017 Ex MV: Poor ex capacity - held in stage I.  1mm Inflat ST depression. No ischemia/infarct. EF 55-65%.   Hyperlipidemia    Hypertension    Hypertrophy of prostate with urinary obstruction 11/25/2016   Pelvic CT October 2018   Incidental lung nodule, > 3mm and < 8mm 11/25/2016   5 mm nodule LLL, chest CT Oct 2018   Persistent atrial fibrillation (HCC)    a. 02/2022 Dx in 01/2022-->CHA2DS2VASc = 5-->Eliquis; b. 02/2022 s/p DCCV (150J->200J->RSR).   Thrombocytosis 08/14/2015   Past Surgical History:  Procedure Laterality Date   ANTERIOR CERVICAL DECOMP/DISCECTOMY FUSION N/A 09/24/2016   Procedure: ANTERIOR CERVICAL DECOMPRESSION/DISCECTOMY FUSION 3 LEVELS-C4-7;  Surgeon: Venetia Night, MD;  Location: [facility];  Service: Neurosurgery;  Laterality: N/A;   CARDIOVERSION N/A 02/27/2022   Procedure: CARDIOVERSION;  Surgeon: Antonieta Iba, MD;  Location: [facility];  Service: Cardiovascular;  Laterality: N/A;   CATARACT EXTRACTION     ELECTROMAGNETIC NAVIGATION BROCHOSCOPY Right 04/07/2018   Procedure: ELECTROMAGNETIC NAVIGATION BRONCHOSCOPY;  Surgeon: Erin Fulling, MD;  Location: [facility];  Service: Cardiopulmonary;  Laterality: Right;   EYE SURGERY Right    Cataract Extraction with IOL   RETINAL LASER PROCEDURE     Family History  Problem Relation Age of Onset   Stroke Mother    Diabetes Mellitus II Father    Heart Problems Father    Heart disease Father    Diabetes Sister    Cancer Maternal Grandfather    Stroke Paternal Grandmother    Prostate cancer Neg Hx    Bladder Cancer Neg Hx    Kidney cancer Neg Hx    Social History   Socioeconomic History   Marital status: Widowed    Spouse name: [name]   Number of children: Not on file   Years of education: Not on file   Highest education level: Not on file  Occupational History   Occupation: retired  Tobacco Use   Smoking status: Former    Current packs/day: 0.00    Average packs/day: 1 pack/day for 30.0 years (30.0 ttl pk-yrs)    Types: Cigarettes    Start date: 07/18/1969     Quit date: 07/19/1999    Years since quitting: 23.2   Smokeless tobacco: Never  Vaping Use   Vaping status: Never Used  Substance and Sexual Activity   Alcohol use: No    Alcohol/week: 0.0 standard drinks of alcohol   Drug use: No   Sexual activity: Yes    Partners: Female  Other Topics Concern   Not on file  Social History Narrative   Not on file   Social Determinants of Health   Financial Resource Strain: Low Risk  (10/10/2022)   Overall Financial Resource Strain (CARDIA)    Difficulty of Paying Living Expenses: Not hard at all  Food Insecurity: No Food Insecurity (10/10/2022)   Hunger Vital Sign  Worried About Programme researcher, broadcasting/film/video in the Last Year: Never true    Ran Out of Food in the Last Year: Never true  Transportation Needs: No Transportation Needs (10/10/2022)   PRAPARE - Administrator, Civil Service (Medical): No    Lack of Transportation (Non-Medical): No  Physical Activity: Sufficiently Active (10/10/2022)   Exercise Vital Sign    Days of Exercise per Week: 5 days    Minutes of Exercise per Session: 60 min  Stress: No Stress Concern Present (10/10/2022)   [employer] of Occupational Health - Occupational Stress Questionnaire    Feeling of Stress : Not at all  Social Connections: Moderately Isolated (10/10/2022)   Social Connection and Isolation Panel [NHANES]    Frequency of Communication with Friends and Family: Twice a week    Frequency of Social Gatherings with Friends and Family: Twice a week    Attends Religious Services: More than 4 times per year    Active Member of [employer] or Organizations: No    Attends Banker Meetings: Never    Marital Status: Widowed    Tobacco Counseling Counseling given: Not Answered   Clinical Intake:  Pre-visit preparation completed: Yes  Pain : No/denies pain     BMI - recorded: 20.22 Nutritional Status: BMI of 19-24  Normal Nutritional Risks: None Diabetes: Yes CBG done?: Yes (BS 186  this am at home) CBG resulted in Enter/ Edit results?: No Did pt. bring in CBG monitor from home?: No  How often do you need to have someone help you when you read instructions, pamphlets, or other written materials from your doctor or pharmacy?: 1 - Never  Interpreter Needed?: No  Comments: lives alone Information entered by :: B.Ninah Moccio,LPN   Activities of Daily Living    10/10/2022    1:20 PM 07/03/2022   12:42 PM  In your present state of health, do you have any difficulty performing the following activities:  Hearing? 0 0  Vision? 0 0  Difficulty concentrating or making decisions? 0 0  Walking or climbing stairs? 0 0  Dressing or bathing? 0 0  Doing errands, shopping? 0 0  Preparing Food and eating ? N   Using the Toilet? N   In the past six months, have you accidently leaked urine? N   Do you have problems with loss of bowel control? N   Managing your Medications? N   Managing your Finances? N   Housekeeping or managing your Housekeeping? N     Patient Care Team: Berniece Salines, FNP as PCP - General (Nurse Practitioner) Iran Ouch, MD as PCP - Cardiology (Cardiology) Lanier Prude, MD as PCP - Electrophysiology (Cardiology) Tedd Sias Marlana Salvage, MD as Physician Assistant (Endocrinology) Creig Hines, MD as Consulting Physician (Oncology) Riki Altes, MD (Urology) Galen Manila, MD as Referring Physician (Ophthalmology)  Indicate any recent Medical Services you may have received from other than [facility] providers in the past year (date may be approximate).     Assessment:   This is a routine wellness examination for Alwaleed.  Hearing/Vision screen Hearing Screening - Comments:: Adequate hearing Vision Screening - Comments:: Adequate vision with glasses Dr Druscilla Brownie  Dietary issues and exercise activities discussed:     Goals Addressed             This Visit's Progress    Patient Stated   On track    Pt states he would like to remain active  and healthy  over the next year     Stay Active and Independent   On track      Depression Screen    10/10/2022    1:13 PM 07/03/2022   12:42 PM 01/02/2022    1:09 PM 09/24/2021    8:31 AM 07/02/2021    1:00 PM 04/03/2021    9:36 AM 01/03/2021    1:06 PM  PHQ 2/9 Scores  PHQ - 2 Score 0 1 0 0 0 0 0  PHQ- 9 Score  1         Fall Risk    10/10/2022    1:10 PM 07/03/2022   12:41 PM 01/02/2022    1:09 PM 09/24/2021    1:03 PM 09/24/2021    8:31 AM  Fall Risk   Falls in the past year? 0 0 0 0 0  Number falls in past yr: 0 0 0  0  Injury with Fall? 0 0 0  0  Risk for fall due to : No Fall Risks No Fall Risks     Follow up Education provided;Falls prevention discussed Falls prevention discussed;Education provided;Falls evaluation completed Falls evaluation completed  Falls evaluation completed    MEDICARE RISK AT HOME: Medicare Risk at Home Any stairs in or around the home?: No If so, are there any without handrails?: No Home free of loose throw rugs in walkways, pet beds, electrical cords, etc?: Yes Adequate lighting in your home to reduce risk of falls?: Yes Life alert?: No Use of a cane, walker or w/c?: No Grab bars in the bathroom?: Yes Shower chair or bench in shower?: Yes Elevated toilet seat or a handicapped toilet?: No  TIMED UP AND GO:  Was the test performed?  No    Cognitive Function:        10/10/2022    1:21 PM 09/24/2021    1:04 PM 03/31/2019   11:33 AM  6CIT Screen  What Year? 0 points 0 points 0 points  What month? 0 points 0 points 0 points  What time? 0 points 0 points 0 points  Count back from 20 0 points 0 points 0 points  Months in reverse 0 points 0 points 0 points  Repeat phrase 0 points 0 points 0 points  Total Score 0 points 0 points 0 points    Immunizations Immunization History  Administered Date(s) Administered   Fluad Quad(high Dose 65+) 11/29/2018, 01/03/2021, 01/02/2022   Influenza, High Dose Seasonal PF 11/20/2014, 01/17/2016,  11/25/2016, 01/26/2018   Influenza,inj,Quad PF,6+ Mos 12/26/2013   Influenza-Unspecified 01/18/2008, 11/28/2008, 01/14/2010, 12/10/2010, 02/02/2012, 02/01/2013, 12/26/2013   PFIZER(Purple Top)SARS-COV-2 Vaccination 05/13/2019, 06/03/2019   Pneumococcal Conjugate-13 04/21/2013   Pneumococcal Polysaccharide-23 08/27/2011   Td 06/30/2006   Zoster, Live 02/02/2012    TDAP status: Up to date  Flu Vaccine status: Up to date  Pneumococcal vaccine status: Up to date  Covid-19 vaccine status: Completed vaccines  Qualifies for Shingles Vaccine? Yes   Zostavax completed No   Shingrix Completed?: No.    Education has been provided regarding the importance of this vaccine. Patient has been advised to call insurance company to determine out of pocket expense if they have not yet received this vaccine. Advised may also receive vaccine at local pharmacy or Health Dept. Verbalized acceptance and understanding.  Screening Tests Health Maintenance  Topic Date Due   Zoster Vaccines- Shingrix (1 of 2) 05/22/1965   DTaP/Tdap/Td (2 - Tdap) 06/29/2016   COVID-19 Vaccine (3 - Pfizer risk series) 07/01/2019  OPHTHALMOLOGY EXAM  09/13/2022   INFLUENZA VACCINE  09/18/2022   HEMOGLOBIN A1C  01/03/2023   Diabetic kidney evaluation - eGFR measurement  07/03/2023   FOOT EXAM  07/03/2023   Diabetic kidney evaluation - Urine ACR  07/04/2023   Medicare Annual Wellness (AWV)  10/10/2023   Pneumonia Vaccine 71+ Years old  Completed   HPV VACCINES  Aged Out   Colonoscopy  Discontinued   Hepatitis C Screening  Discontinued    Health Maintenance  Health Maintenance Due  Topic Date Due   Zoster Vaccines- Shingrix (1 of 2) 05/22/1965   DTaP/Tdap/Td (2 - Tdap) 06/29/2016   COVID-19 Vaccine (3 - Pfizer risk series) 07/01/2019   OPHTHALMOLOGY EXAM  09/13/2022   INFLUENZA VACCINE  09/18/2022    Colorectal cancer screening: No longer required.   Lung Cancer Screening: (Low Dose CT Chest recommended if Age  77-80 years, 20 pack-year currently smoking OR have quit w/in 15years.) does not qualify.   Lung Cancer Screening Referral: no  Additional Screening:  Hepatitis C Screening: does not qualify; Completed yes  Vision Screening: Recommended annual ophthalmology exams for early detection of glaucoma and other disorders of the eye. Is the patient up to date with their annual eye exam?  Yes  Who is the provider or what is the name of the office in which the patient attends annual eye exams? Dr Druscilla Brownie If pt is not established with a provider, would they like to be referred to a provider to establish care? No .   Dental Screening: Recommended annual dental exams for proper oral hygiene  Diabetic Foot Exam: n/a  Community Resource Referral / Chronic Care Management: CRR required this visit?  No   CCM required this visit?  No     Plan:     I have personally reviewed and noted the following in the patient's chart:   Medical and social history Use of alcohol, tobacco or illicit drugs  Current medications and supplements including opioid prescriptions. Patient is not currently taking opioid prescriptions. Functional ability and status Nutritional status Physical activity Advanced directives List of other physicians Hospitalizations, surgeries, and ER visits in previous 12 months Vitals Screenings to include cognitive, depression, and falls Referrals and appointments  In addition, I have reviewed and discussed with patient certain preventive protocols, quality metrics, and best practice recommendations. A written personalized care plan for preventive services as well as general preventive health recommendations were provided to patient.     Sue Lush, LPN   05/26/8117   After Visit Summary: (MyChart) Due to this being a telephonic visit, the after visit summary with patients personalized plan was offered to patient via MyChart   Nurse Notes: The patient states he is doing  well and has no concerns or questions at this time. He does relay still coping with the loss of his wife two years ago but relays he has few "down" days now. Pt encourages to seek out help if he feels his coping mechanisms fail and needs therapy/help.

## 2022-11-06 ENCOUNTER — Other Ambulatory Visit: Payer: Self-pay | Admitting: Urology

## 2022-11-12 NOTE — Progress Notes (Unsigned)
Cardiology Office Note Date:  11/13/2022  Patient ID:  Garrett Yoder., DOB 01/19/1946, MRN [id number] PCP:  Berniece Salines, FNP  Cardiologist:  Lorine Bears, MD Electrophysiologist: Lanier Prude, MD    Chief Complaint: 3 mon Afib follow-up  History of Present Illness: Garrett Yoder. is a 77 y.o. male with PMH notable for persis AFib, CAD, HTN; seen today for Lanier Prude, MD for routine electrophysiology followup.  Diagnosed with afib 12/2021, DCCV 02/27/22 with return of AF on 1/16.  Last saw Dr. Lalla Brothers 03/2022, was asymptomatic from an A-fib perspective and so rate control was agreed upon.  I saw him mid-May 2024 and he was noted to be in AFib w RVR up to 160, BB steadily increased. I last saw him 07/2022 where ventricular rates were maintaining 80-100 on 100mg  toprol BID.   On follow-up today, he continues to feel well, no acute complaints. He brings a BP/pulse log. BPs are typically 90-110 systolics, never 120. Pulse is high 80s-110s.  He is unable to afford eliquis currently, has two weeks left at home. Brings in completed medication assistance paperwork.   He denies chest pain, palpitations, dyspnea, PND, orthopnea, nausea, vomiting, dizziness, syncope, edema, weight gain, or early satiety.     Past Medical History:  Diagnosis Date   Anemia 08/14/2015   Anxiety    Aortic atherosclerosis (HCC) 11/25/2016   Chest CT Oct 2018   Aorto-iliac atherosclerosis (HCC) 11/25/2016   Abd/pelvic CT Oct 2018   Coronary artery calcification seen on CAT scan 11/25/2016   Chest CT Oct 2018; refer to Dr. Kirke Corin   DDD (degenerative disc disease), cervical 08/21/2016   Depression    Diabetes mellitus without complication (HCC)    Facet arthropathy, cervical 08/21/2016   GERD (gastroesophageal reflux disease)    Heart murmur    As child   History of echocardiogram    a. 01/2022 Echo: EF 50-55%, no rwma, nl RV fxn w/ RVSP 34.18mmHg, Mildly dil RA. Mild MR. Mod TR. AoV  sclerosis w/o stenosis.   History of stress test    a. 01/2017 Ex MV: Poor ex capacity - held in stage I. 1mm Inflat ST depression. No ischemia/infarct. EF 55-65%.   Hyperlipidemia    Hypertension    Hypertrophy of prostate with urinary obstruction 11/25/2016   Pelvic CT October 2018   Incidental lung nodule, > 3mm and < 8mm 11/25/2016   5 mm nodule LLL, chest CT Oct 2018   Persistent atrial fibrillation (HCC)    a. 02/2022 Dx in 01/2022-->CHA2DS2VASc = 5-->Eliquis; b. 02/2022 s/p DCCV (150J->200J->RSR).   Thrombocytosis 08/14/2015    Past Surgical History:  Procedure Laterality Date   ANTERIOR CERVICAL DECOMP/DISCECTOMY FUSION N/A 09/24/2016   Procedure: ANTERIOR CERVICAL DECOMPRESSION/DISCECTOMY FUSION 3 LEVELS-C4-7;  Surgeon: Venetia Night, MD;  Location: [facility];  Service: Neurosurgery;  Laterality: N/A;   CARDIOVERSION N/A 02/27/2022   Procedure: CARDIOVERSION;  Surgeon: Antonieta Iba, MD;  Location: [facility];  Service: Cardiovascular;  Laterality: N/A;   CATARACT EXTRACTION     ELECTROMAGNETIC NAVIGATION BROCHOSCOPY Right 04/07/2018   Procedure: ELECTROMAGNETIC NAVIGATION BRONCHOSCOPY;  Surgeon: Erin Fulling, MD;  Location: [facility];  Service: Cardiopulmonary;  Laterality: Right;   EYE SURGERY Right    Cataract Extraction with IOL   RETINAL LASER PROCEDURE      Current Outpatient Medications  Medication Instructions   acetaminophen (TYLENOL) 500 mg, Oral, 2 times daily PRN   apixaban (ELIQUIS) 5 mg, Oral, 2 times  daily   atorvastatin (LIPITOR) 40 mg, Oral, Daily   bimatoprost (LUMIGAN) 0.01 % SOLN 1 drop, Both Eyes, Daily at bedtime   finasteride (PROSCAR) 5 MG tablet TAKE 1 TABLET BY MOUTH DAILY   insulin lispro (HUMALOG) 100 UNIT/ML KwikPen Subcutaneous, Daily, Pt taking 4 units in AM, 12 units at noon    Levemir FlexTouch 30 Units, Subcutaneous, Every evening   metFORMIN (GLUCOPHAGE) 1,000 mg, Oral, 2 times daily with meals   metoprolol succinate (TOPROL-XL) 100 MG  24 hr tablet TAKE 1 TABLET BY MOUTH TWICE  DAILY . TAKE WITH OR IMMEDIATELY FOLLOWING A MEAL   Multiple Vitamin (MULTI-VITAMINS) TABS 1 tablet, Oral, 2 times daily   pioglitazone (ACTOS) 15 mg, Oral, Daily   SIMBRINZA 1-0.2 % SUSP 1 drop, Both Eyes, 3 times daily   tamsulosin (FLOMAX) 0.4 mg, Oral, Daily    Social History:  The patient  reports that he quit smoking about 23 years ago. His smoking use included cigarettes. He started smoking about 53 years ago. He has a 30 pack-year smoking history. He has never used smokeless tobacco. He reports that he does not drink alcohol and does not use drugs.   Family History:   The patient's family history includes Cancer in his maternal grandfather; Diabetes in his sister; Diabetes Mellitus II in his father; Heart Problems in his father; Heart disease in his father; Stroke in his mother and paternal grandmother.  ROS:  Please see the history of present illness. All other systems are reviewed and otherwise negative.   PHYSICAL EXAM:  VS:  BP 120/70 (BP Location: Left Arm, Patient Position: Sitting, Cuff Size: Normal)   Pulse (!) 134   Ht 5\' 8"  (1.727 m)   Wt 134 lb 4 oz (60.9 kg)   SpO2 98%   BMI 20.41 kg/m  BMI: Body mass index is 20.41 kg/m.  GEN- The patient is well appearing, alert and oriented x 3 today.   Lungs- Clear to ausculation bilaterally, normal work of breathing.  Heart- Irregularly irregular marked tachycardic rhythm, no murmurs, rubs or gallops Extremities- No peripheral edema, warm, dry   EKG is ordered. Personal review of EKG from today shows: EKG Interpretation Date/Time:  Thursday November 13 2022 10:02:36 EDT Ventricular Rate:  134 PR Interval:    QRS Duration:  66 QT Interval:  322 QTC Calculation: 480 R Axis:   84  Text Interpretation: Atrial fibrillation with rapid ventricular response with premature ventricular or aberrantly conducted complexes Confirmed by Sherie Don [phone]) on 11/13/2022 10:06:31 AM     Recent Labs: 01/02/2022: TSH 2.829 02/21/2022: Magnesium 2.1 07/03/2022: ALT 23; BUN 29; Creat 1.46; Hemoglobin 11.7; Platelets 485; Potassium 4.7; Sodium 140  07/03/2022: Cholesterol 124; HDL 49; LDL Cholesterol (Calc) 52; Total CHOL/HDL Ratio 2.5; Triglycerides 146   CrCl cannot be calculated (Patient's most recent lab result is older than the maximum 21 days allowed.).   Wt Readings from Last 3 Encounters:  11/13/22 134 lb 4 oz (60.9 kg)  10/10/22 133 lb (60.3 kg)  08/12/22 133 lb (60.3 kg)     Additional studies reviewed include: Previous EP, cardiology notes.   TTE, 01/30/2022  1. Left ventricular ejection fraction, by estimation, is 50 to 55%. The left ventricle has low normal function. The left ventricle has no regional wall motion abnormalities. Left ventricular diastolic parameters are indeterminate.   2. Right ventricular systolic function is normal. The right ventricular size is normal. There is normal pulmonary artery systolic pressure. The estimated right ventricular  systolic pressure is 34.2 mmHg.   3. Right atrial size was mildly dilated.   4. The mitral valve is normal in structure. Mild mitral valve regurgitation. No evidence of mitral stenosis.   5. Tricuspid valve regurgitation is moderate.   6. The aortic valve is normal in structure. Aortic valve regurgitation is not visualized. Aortic valve sclerosis/calcification is present, without any evidence of aortic stenosis.   7. The inferior vena cava is normal in size with greater than 50% respiratory variability, suggesting right atrial pressure of 3 mmHg.   8. Rhythm is atrial fibrillation rate 90 to 130 bpm    ASSESSMENT AND PLAN:  #) perm AFib #) HTN Ventricular rates ok at home, very elevated in office today BP borderline on home readings Will start 0.0625mg  digoxin Decrease toprol to 100mg  daily Dig level in 1 week.    #) Hypercoag d/t AFib CHA2DS2-VASc Score = 5 [CHF History: 0, HTN History: 1, Diabetes  History: 1, Stroke History: 0, Vascular Disease History: 1, Age Score: 2, Gender Score: 0].  Therefore, the patient's annual risk of stroke is 7.2 %. NOAC - eliquis 5mg  BID, appropriately dosed  No bleeding concerns      Current medicines are reviewed at length with the patient today.   The patient does not have concerns regarding his medicines.  The following changes were made today:  none   Labs/ tests ordered today include:  Orders Placed This Encounter  Procedures   EKG 12-Lead     Disposition: Follow up with EP APP in 6 months   Signed, Sherie Don, NP  11/13/22  10:06 AM  Electrophysiology [facility]

## 2022-11-13 ENCOUNTER — Encounter: Payer: Self-pay | Admitting: Cardiology

## 2022-11-13 ENCOUNTER — Ambulatory Visit: Payer: Medicare Other | Attending: Cardiology | Admitting: Cardiology

## 2022-11-13 ENCOUNTER — Telehealth: Payer: Self-pay | Admitting: *Deleted

## 2022-11-13 VITALS — BP 120/70 | HR 134 | Ht 68.0 in | Wt 134.2 lb

## 2022-11-13 DIAGNOSIS — D6869 Other thrombophilia: Secondary | ICD-10-CM

## 2022-11-13 DIAGNOSIS — I4821 Permanent atrial fibrillation: Secondary | ICD-10-CM

## 2022-11-13 DIAGNOSIS — I10 Essential (primary) hypertension: Secondary | ICD-10-CM

## 2022-11-13 MED ORDER — METOPROLOL SUCCINATE ER 100 MG PO TB24: ORAL_TABLET | ORAL | 3 refills | Status: DC

## 2022-11-13 MED ORDER — APIXABAN 5 MG PO TABS: 5.0000 mg | ORAL_TABLET | Freq: Two times a day (BID) | ORAL | 3 refills | Status: DC

## 2022-11-13 MED ORDER — DIGOXIN 125 MCG PO TABS: 0.0625 mg | ORAL_TABLET | Freq: Every day | ORAL | 3 refills | Status: DC

## 2022-11-13 NOTE — Telephone Encounter (Signed)
Patient here in office today for appointment with provider. He requested assistance with his medication and application completed. Reviewed that he would need to bring in income and out of pocket expense report from his pharmacy. He will get one from [facility] but states that most all of his are now mail order. He requested assistance for me to call them request what is needed. Advised they may want him to call so inquired if 3 way call would be ok. He agreed and was very appreciative for the assistance.

## 2022-11-13 NOTE — Patient Instructions (Addendum)
Medication Instructions:  START Digoxin 0.0625 mg daily (0.5 tablet)  DECREASE Metoprolol to 100 mg daily   *If you need a refill on your cardiac medications before your next appointment, please call your pharmacy*   Lab Work:  Your provider would like for you to have following labs drawn next [facility] (10/02) Digoxin level. **on the morning of the blood work, please hold the AM digoxin, have lab work completed, and then resume taking the tablet AFTER the blood work is completed**  If you have labs (blood work) drawn today and your tests are completely normal, you will receive your results only by: [employer] (if you have MyChart) OR A paper copy in the mail If you have any lab test that is abnormal or we need to change your treatment, we will call you to review the results.  Follow-Up: At [facility], you and your health needs are our priority.  As part of our continuing mission to provide you with exceptional heart care, we have created designated Provider Care Teams.  These Care Teams include your primary Cardiologist (physician) and Advanced Practice Providers (APPs -  Physician Assistants and Nurse Practitioners) who all work together to provide you with the care you need, when you need it.  We recommend signing up for the patient portal called "MyChart".  Sign up information is provided on this After Visit Summary.  MyChart is used to connect with patients for Virtual Visits (Telemedicine).  Patients are able to view lab/test results, encounter notes, upcoming appointments, etc.  Non-urgent messages can be sent to your provider as well.   To learn more about what you can do with MyChart, go to [email or web address].    Your next appointment:   3 month(s)  Provider:   Sherie Don, NP    Call us if you have not heard back on patient assistance for Eliquis, when you are getting low and we can check for samples.

## 2022-11-13 NOTE — Telephone Encounter (Signed)
Spoke with pharmacy and they are not familiar or sure about how to get that report. Advised that it is a total amount that the patient has spent on prescriptions from January to now. Clint stated they will review information requested and will call back. He did provide me with another to try as well. [phone]

## 2022-11-13 NOTE — Telephone Encounter (Signed)
Called number provided. They can do the report but it has to be mailed to the patient. Advised that I would make patient aware of this report coming to him.

## 2022-11-20 LAB — DIGOXIN LEVEL: Digoxin, Serum: <0.4 ng/mL — ABNORMAL LOW (ref 0.5–0.9)

## 2022-11-25 ENCOUNTER — Telehealth: Payer: Self-pay | Admitting: Cardiovascular Disease

## 2022-11-25 ENCOUNTER — Other Ambulatory Visit: Payer: Self-pay

## 2022-11-25 MED ORDER — APIXABAN 5 MG PO TABS: 5.0000 mg | ORAL_TABLET | Freq: Two times a day (BID) | ORAL | 0 refills | Status: DC

## 2022-11-25 NOTE — Telephone Encounter (Signed)
Garrett Yoder from [employer] is requesting a callback regarding them missing some information on the application. Please advise

## 2022-11-25 NOTE — Telephone Encounter (Signed)
Application faxed to company on 11/20/22. Called and spoke with [employer]MS[employer] in regards to application. He stated it can take up to 4 days for review. Samples were given to patient and will monitor for update on application.

## 2022-11-28 ENCOUNTER — Other Ambulatory Visit: Payer: Self-pay | Admitting: Nurse Practitioner

## 2022-11-28 DIAGNOSIS — E782 Mixed hyperlipidemia: Secondary | ICD-10-CM

## 2022-11-28 NOTE — Telephone Encounter (Signed)
Requested Prescriptions  Pending Prescriptions Disp Refills   atorvastatin (LIPITOR) 40 MG tablet [Pharmacy Med Name: Atorvastatin Calcium 40 MG Oral Tablet] 100 tablet 2    Sig: TAKE 1 TABLET BY MOUTH DAILY     Cardiovascular:  Antilipid - Statins Failed - 11/28/2022  4:26 AM      Failed - Lipid Panel in normal range within the last 12 months    Cholesterol, Total  Date Value Ref Range Status  07/24/2016 155 100 - 199 mg/dL Final   Cholesterol  Date Value Ref Range Status  07/03/2022 124 <200 mg/dL Final   LDL Cholesterol (Calc)  Date Value Ref Range Status  07/03/2022 52 mg/dL (calc) Final    Comment:    Reference range: <100 . Desirable range <100 mg/dL for primary prevention;   <70 mg/dL for patients with CHD or diabetic patients  with > or = 2 CHD risk factors. Marland Kitchen LDL-C is now calculated using the Martin-Hopkins  calculation, which is a validated novel method providing  better accuracy than the Friedewald equation in the  estimation of LDL-C.  Horald Pollen et al. Lenox Ahr. 1610;960(45): 2061-2068  (http://education.[employer].com/faq/FAQ164)    HDL  Date Value Ref Range Status  07/03/2022 49 > OR = 40 mg/dL Final  40/98/1191 62 >47 mg/dL Final   Triglycerides  Date Value Ref Range Status  07/03/2022 146 <150 mg/dL Final         Passed - Patient is not pregnant      Passed - Valid encounter within last 12 months    Recent Outpatient Visits           4 months ago Mixed hyperlipidemia   [facility] Berniece Salines, FNP   11 months ago Mixed hyperlipidemia   [facility] Della Goo F, FNP   1 year ago Type 2 diabetes mellitus with hyperglycemia, with long-term current use of insulin [facility])   [facility] Berniece Salines, FNP   1 year ago Essential hypertension, benign   [facility] Berniece Salines, FNP   1 year ago Essential hypertension, benign   [facility] Berniece Salines, FNP       Future Appointments             In 1 month Zane Herald, Rudolpho Sevin, FNP [facility]   In 2 months Sherie Don, NP [facility]

## 2022-11-28 NOTE — Telephone Encounter (Signed)
Spoke with assistance program and they just needed the date changed. Pt put his DOB instead of current date. Application updated and faxed back to company

## 2022-12-18 ENCOUNTER — Telehealth: Payer: Self-pay | Admitting: *Deleted

## 2022-12-18 MED ORDER — APIXABAN 5 MG PO TABS: 5.0000 mg | ORAL_TABLET | Freq: Two times a day (BID) | ORAL | 0 refills | Status: DC

## 2022-12-18 NOTE — Telephone Encounter (Signed)
The patient walked in asking for one box of Eliquis samples until his assistance is finalized. One box has been given.

## 2023-01-05 ENCOUNTER — Other Ambulatory Visit: Payer: Self-pay

## 2023-01-05 ENCOUNTER — Ambulatory Visit (INDEPENDENT_AMBULATORY_CARE_PROVIDER_SITE_OTHER): Payer: Medicare Other | Admitting: Nurse Practitioner

## 2023-01-05 ENCOUNTER — Ambulatory Visit
Admission: RE | Admit: 2023-01-05 | Discharge: 2023-01-05 | Disposition: A | Discharge status: Home / Self Care | Payer: Medicare Other | Source: Ambulatory Visit | Attending: Nurse Practitioner | Admitting: Nurse Practitioner

## 2023-01-05 ENCOUNTER — Encounter: Payer: Self-pay | Admitting: Nurse Practitioner

## 2023-01-05 VITALS — BP 120/80 | HR 76 | Temp 97.9°F | Resp 16 | Ht 68.0 in | Wt 145.4 lb

## 2023-01-05 DIAGNOSIS — N1832 Chronic kidney disease, stage 3b: Secondary | ICD-10-CM | POA: Diagnosis not present

## 2023-01-05 DIAGNOSIS — I708 Atherosclerosis of other arteries: Secondary | ICD-10-CM

## 2023-01-05 DIAGNOSIS — E782 Mixed hyperlipidemia: Secondary | ICD-10-CM | POA: Diagnosis not present

## 2023-01-05 DIAGNOSIS — M79661 Pain in right lower leg: Secondary | ICD-10-CM | POA: Diagnosis present

## 2023-01-05 DIAGNOSIS — E1165 Type 2 diabetes mellitus with hyperglycemia: Secondary | ICD-10-CM | POA: Diagnosis not present

## 2023-01-05 DIAGNOSIS — D649 Anemia, unspecified: Secondary | ICD-10-CM

## 2023-01-05 DIAGNOSIS — R600 Localized edema: Secondary | ICD-10-CM | POA: Diagnosis present

## 2023-01-05 DIAGNOSIS — I10 Essential (primary) hypertension: Secondary | ICD-10-CM

## 2023-01-05 DIAGNOSIS — M79662 Pain in left lower leg: Secondary | ICD-10-CM

## 2023-01-05 DIAGNOSIS — I4819 Other persistent atrial fibrillation: Secondary | ICD-10-CM

## 2023-01-05 DIAGNOSIS — D75839 Thrombocytosis, unspecified: Secondary | ICD-10-CM

## 2023-01-05 DIAGNOSIS — Z794 Long term (current) use of insulin: Secondary | ICD-10-CM

## 2023-01-05 DIAGNOSIS — I700 Atherosclerosis of aorta: Secondary | ICD-10-CM

## 2023-01-05 NOTE — Assessment & Plan Note (Addendum)
Managed by endo continue   Lantus 30 units every night, Humalog 6 units before breakfast and 10 units before supper he is also on Actos 15 mg daily and metformin 1000 mg daily.

## 2023-01-05 NOTE — Assessment & Plan Note (Signed)
Continue metoprolol 100 mg BID

## 2023-01-05 NOTE — Assessment & Plan Note (Signed)
Continue to work on DM control.  Getting labs today

## 2023-01-05 NOTE — Assessment & Plan Note (Signed)
Continue metoprolol 100 mg BID and eliquis 5 mg BID, digoxin 0.125 mg daily.

## 2023-01-05 NOTE — Assessment & Plan Note (Signed)
noted on ct scan -Medications: atorvastatin 40 mg daily

## 2023-01-05 NOTE — Assessment & Plan Note (Signed)
-  Medications: atorvastatin 40 mg daily

## 2023-01-05 NOTE — Progress Notes (Signed)
BP 120/80   Pulse 76   Temp 97.9 F (36.6 C) (Oral)   Resp 16   Ht 5\' 8"  (1.727 m)   Wt 145 lb 6.4 oz (66 kg)   SpO2 98%   BMI 22.11 kg/m    Subjective:    Patient ID: Garrett Yoder., male    DOB: Mar 08, 1945, 77 y.o.   MRN: [id number]  HPI: Garrett Yoder. is a 78 y.o. male  Chief Complaint  Patient presents with   Medical Management of Chronic Issues   Diabetes, Type 2: -managed by endocrinology -Last A1c 8.6 -Medications:  Lantus 30 units every night, Humalog 6 units before breakfast and 10 units before supper he is also on Actos 15 mg daily and metformin 1000 mg daily.  -Patient is compliant with the above medications and reports no side effects.  -Checking BG at home: yes 100-170s -Diet: reduce sugar and processed foods in your diet  -Exercise: recommend 150 min of physical activity weekly   -Eye exam: 06/2022, [facility] -Foot exam: utd -Microalbumin: utd -Statin: yes -PNA vaccine: yes -Denies symptoms of hypoglycemia, polyuria, polydipsia, numbness extremities, foot ulcers/trauma.   HLD/Aortic atherosclerosis/aortic atherosclerosis/aortic-iliac atherosclerosis: - noted on ct scan -Medications: atorvastatin 40 mg daily -Patient is compliant with above medications and reports no side effects.  -Last lipid panel:  Lipid Panel     Component Value Date/Time   CHOL 124 07/03/2022 1325   CHOL 155 07/24/2016 0829   TRIG 146 07/03/2022 1325   HDL 49 07/03/2022 1325   HDL 62 07/24/2016 0829   CHOLHDL 2.5 07/03/2022 1325   VLDL 22 08/14/2015 1532   LDLCALC 52 07/03/2022 1325   LABVLDL 14 07/24/2016 0829    Hypertension:  -Medications: metoprolol 100 mg BID -Patient is compliant with above medications and reports no side effects. -Checking BP at home (average): 120s -Denies any SOB, CP, vision changes, LE edema or symptoms of hypotension -Diet: recommend DASH diet  -Exercise: recommend 150 min of physical activity weekly        01/05/2023    1:35  PM 11/13/2022   10:02 AM 10/10/2022    1:05 PM  Vitals with BMI  Height 5\' 8"  5\' 8"  5\' 8"   Weight 145 lbs 6 oz 134 lbs 4 oz 133 lbs  BMI 22.11 20.42 20.23  Systolic 120 120 --  Diastolic 80 70 --  Pulse 76 134      Chronic kidney disease:  He does not see a nephrologist and prefers not to.  He is aware to that he needs to avoid NSAIDs. No changes CMP     Component Value Date/Time   NA 140 07/03/2022 1325   NA 140 07/24/2016 0829   K 4.7 07/03/2022 1325   CL 102 07/03/2022 1325   CO2 26 07/03/2022 1325   GLUCOSE 148 (H) 07/03/2022 1325   BUN 29 (H) 07/03/2022 1325   BUN 26 07/24/2016 0829   CREATININE 1.46 (H) 07/03/2022 1325   CALCIUM 10.0 07/03/2022 1325   PROT 7.2 07/03/2022 1325   PROT 7.2 07/24/2016 0829   ALBUMIN 4.1 01/02/2022 1610   ALBUMIN 4.9 (H) 07/24/2016 0829   AST 18 07/03/2022 1325   ALT 23 07/03/2022 1325   ALKPHOS 101 01/02/2022 1610   BILITOT 0.4 07/03/2022 1325   BILITOT 0.3 07/24/2016 0829   EGFR 50 (L) 07/03/2022 1325   GFRNONAA 50 (L) 02/21/2022 0943   GFRNONAA 49 (L) 08/17/2019 0916     Anemia/ thrombocythemia:  He last saw oncology/hematology on 05/27/2021. Plan from hem/onc was 1.  Thrombocytosis: Platelet counts fluctuate between 400-500 without a clear increasing trend.  Myeloproliferative work-up including a bone marrow biopsy has been negative in the past.  Continue to monitor. 2.  Normocytic anemia: Presently his hemoglobin is stable between 11-12.  He also has an element of chronic kidney disease with a creatinine of 1.6.  He does not require any EPO at this time. Consider if hemoglobin <10. Patient reports no changes.     Latest Ref Rng & Units 07/03/2022    1:25 PM 02/21/2022    9:43 AM 01/14/2022   10:58 AM  CBC  WBC 3.8 - 10.8 Thousand/uL 7.8  7.3  9.8   Hemoglobin 13.2 - 17.1 g/dL 09.8  11.9  14.7   Hematocrit 38.5 - 50.0 % 34.9  36.9  38.7   Platelets 140 - 400 Thousand/uL 485  403  487      Afib: patient is established with  cardiology. Last appointment was 11/13/2022. He currently takes metoprolol 100 mg BID and eliquis 5 mg BID, digoxin 0.125 mg daily. He reports he is feeling good.   Lower extremity edema: patient reports a couple days ago he was walking his dog and he developed severe calf pain. He says it eventually went away and then the swelling started.  He does have bilateral calf pain.  He has a hx of atrial fibrillation putting him at increased risk for DVT. He is on eliquis.  Will get stat ultrasound, and get labs to check BNP and kidney function.   Relevant past medical, surgical, family and social history reviewed and updated as indicated. Interim medical history since our last visit reviewed. Allergies and medications reviewed and updated.  Review of Systems  Constitutional: Negative for fever or weight change.  Respiratory: Negative for cough and shortness of breath.   Cardiovascular: Negative for chest pain or palpitations.  Gastrointestinal: Negative for abdominal pain, no bowel changes.  Musculoskeletal: Negative for gait problem or joint swelling. Positive for calf pain Skin: Negative for rash.  Neurological: Negative for dizziness or headache.  No other specific complaints in a complete review of systems (except as listed in HPI above).      Objective:    BP 120/80   Pulse 76   Temp 97.9 F (36.6 C) (Oral)   Resp 16   Ht 5\' 8"  (1.727 m)   Wt 145 lb 6.4 oz (66 kg)   SpO2 98%   BMI 22.11 kg/m   Wt Readings from Last 3 Encounters:  01/05/23 145 lb 6.4 oz (66 kg)  11/13/22 134 lb 4 oz (60.9 kg)  10/10/22 133 lb (60.3 kg)    Physical Exam  Constitutional: Patient appears well-developed and well-nourished.  No distress.  HEENT: head atraumatic, normocephalic, pupils equal and reactive to light,  neck supple Cardiovascular: tachycardic rate, irregular rhythm and abnormal heart sounds.  No murmur heard. 2 + BLE edema. Pulmonary/Chest: Effort normal and breath sounds normal. No  respiratory distress. Abdominal: Soft.  There is no tenderness. Lower extremity: 2 + pitting edema,  calf tenderness Psychiatric: Patient has a normal mood and affect. behavior is normal. Judgment and thought content normal.     Results for orders placed or performed in visit on 11/13/22  Digoxin level  Result Value Ref Range   Digoxin, Serum <0.4 (L) 0.5 - 0.9 ng/mL      Assessment & Plan:   Problem List Items Addressed This Visit  Cardiovascular and Mediastinum   Essential hypertension, benign - Primary    Continue metoprolol 100 mg BID      Relevant Orders   COMPLETE METABOLIC PANEL WITH GFR   CBC with Differential/Platelet   Aortic atherosclerosis (HCC)     noted on ct scan -Medications: atorvastatin 40 mg daily      Aorto-iliac atherosclerosis (HCC)     noted on ct scan -Medications: atorvastatin 40 mg daily      Persistent atrial fibrillation (HCC)    Continue metoprolol 100 mg BID and eliquis 5 mg BID, digoxin 0.125 mg daily.         Endocrine   Type 2 diabetes mellitus with hyperglycemia, with long-term current use of insulin (HCC)    Managed by endo continue   Lantus 30 units every night, Humalog 6 units before breakfast and 10 units before supper he is also on Actos 15 mg daily and metformin 1000 mg daily.         Genitourinary   Chronic kidney disease, stage 3b (HCC)    Continue to work on DM control.  Getting labs today      Relevant Orders   COMPLETE METABOLIC PANEL WITH GFR     Other   Hyperlipidemia    -Medications: atorvastatin 40 mg daily      Anemia    Getting labs      Relevant Orders   CBC with Differential/Platelet   Other Visit Diagnoses     Thrombocytosis       getting labs   Lower extremity edema       bilateral lower extremity ultrasound and labs.   Relevant Orders   US Venous Img Lower Bilateral (DVT)   Brain natriuretic peptide   COMPLETE METABOLIC PANEL WITH GFR   Bilateral calf pain       bilateral lower  extremity ultrasound and labs.   Relevant Orders   US Venous Img Lower Bilateral (DVT)   Brain natriuretic peptide   COMPLETE METABOLIC PANEL WITH GFR         Follow up plan: Return in about 6 months (around 07/05/2023) for follow up.

## 2023-01-05 NOTE — Assessment & Plan Note (Signed)
Getting labs 

## 2023-01-06 LAB — BRAIN NATRIURETIC PEPTIDE: Brain Natriuretic Peptide: 184 pg/mL — ABNORMAL HIGH (ref <100)

## 2023-01-06 LAB — CBC WITH DIFFERENTIAL/PLATELET
Absolute Lymphocytes: 1607 {cells}/uL (ref 850–3900)
Absolute Monocytes: 741 {cells}/uL (ref 200–950)
Basophils Absolute: 47 {cells}/uL (ref 0–200)
Basophils Relative: 0.6 %
Eosinophils Absolute: 47 {cells}/uL (ref 15–500)
Eosinophils Relative: 0.6 %
HCT: 33.6 % — ABNORMAL LOW (ref 38.5–50.0)
Hemoglobin: 10.9 g/dL — ABNORMAL LOW (ref 13.2–17.1)
MCH: 30.5 pg (ref 27.0–33.0)
MCHC: 32.4 g/dL (ref 32.0–36.0)
MCV: 94.1 fL (ref 80.0–100.0)
MPV: 8.4 fL (ref 7.5–12.5)
Monocytes Relative: 9.5 %
Neutro Abs: 5359 {cells}/uL (ref 1500–7800)
Neutrophils Relative %: 68.7 %
Platelets: 592 10*3/uL — ABNORMAL HIGH (ref 140–400)
RBC: 3.57 10*6/uL — ABNORMAL LOW (ref 4.20–5.80)
RDW: 12.3 % (ref 11.0–15.0)
Total Lymphocyte: 20.6 %
WBC: 7.8 10*3/uL (ref 3.8–10.8)

## 2023-01-06 LAB — COMPLETE METABOLIC PANEL WITH GFR
AG Ratio: 1.7 (calc) (ref 1.0–2.5)
ALT: 27 U/L (ref 9–46)
AST: 26 U/L (ref 10–35)
Albumin: 4.4 g/dL (ref 3.6–5.1)
Alkaline phosphatase (APISO): 165 U/L — ABNORMAL HIGH (ref 35–144)
BUN: 16 mg/dL (ref 7–25)
CO2: 28 mmol/L (ref 20–32)
Calcium: 9.6 mg/dL (ref 8.6–10.3)
Chloride: 104 mmol/L (ref 98–110)
Creat: 1.27 mg/dL (ref 0.70–1.28)
Globulin: 2.6 g/dL (ref 1.9–3.7)
Glucose, Bld: 61 mg/dL — ABNORMAL LOW (ref 65–99)
Potassium: 4.5 mmol/L (ref 3.5–5.3)
Sodium: 139 mmol/L (ref 135–146)
Total Bilirubin: 0.4 mg/dL (ref 0.2–1.2)
Total Protein: 7 g/dL (ref 6.1–8.1)
eGFR: 59 mL/min/{1.73_m2} — ABNORMAL LOW (ref >=60)

## 2023-01-14 ENCOUNTER — Other Ambulatory Visit: Payer: Self-pay | Admitting: Nurse Practitioner

## 2023-01-14 NOTE — Telephone Encounter (Signed)
Rx was sent to pharmacy on 03/27/22 #90/3.   Requested Prescriptions  Refused Prescriptions Disp Refills   tamsulosin (FLOMAX) 0.4 MG CAPS capsule [Pharmacy Med Name: Tamsulosin HCl 0.4 MG Oral Capsule] 100 capsule 2    Sig: TAKE 1 CAPSULE BY MOUTH DAILY     Urology: Alpha-Adrenergic Blocker Failed - 01/14/2023  4:41 AM      Failed - PSA in normal range and within 360 days    Prostate Specific Ag, Serum  Date Value Ref Range Status  11/14/2016 2.6 0.0 - 4.0 ng/mL Final    Comment:    Roche ECLIA methodology. According to the American Urological Association, Serum PSA should decrease and remain at undetectable levels after radical prostatectomy. The AUA defines biochemical recurrence as an initial PSA value 0.2 ng/mL or greater followed by a subsequent confirmatory PSA value 0.2 ng/mL or greater. Values obtained with different assay methods or kits cannot be used interchangeably. Results cannot be interpreted as absolute evidence of the presence or absence of malignant disease.          Passed - Last BP in normal range    BP Readings from Last 1 Encounters:  01/05/23 120/80         Passed - Valid encounter within last 12 months    Recent Outpatient Visits           1 week ago Essential hypertension, benign   [facility] Berniece Salines, FNP   6 months ago Mixed hyperlipidemia   [facility] Berniece Salines, FNP   1 year ago Mixed hyperlipidemia   [facility] Della Goo F, FNP   1 year ago Type 2 diabetes mellitus with hyperglycemia, with long-term current use of insulin [facility])   [facility] Berniece Salines, FNP   1 year ago Essential hypertension, benign   [facility] Berniece Salines, FNP       Future Appointments             In 3 weeks Sherie Don, NP [facility] [place]   In 5 months Zane Herald, Rudolpho Sevin, FNP [facility]

## 2023-01-20 ENCOUNTER — Inpatient Hospital Stay: Payer: Medicare Other | Attending: Oncology

## 2023-01-20 ENCOUNTER — Inpatient Hospital Stay: Payer: Medicare Other | Admitting: Oncology

## 2023-01-20 ENCOUNTER — Encounter: Payer: Self-pay | Admitting: Oncology

## 2023-01-20 VITALS — BP 128/59 | HR 61 | Temp 97.3°F | Resp 18 | Wt 139.0 lb

## 2023-01-20 DIAGNOSIS — D75839 Thrombocytosis, unspecified: Secondary | ICD-10-CM

## 2023-01-20 DIAGNOSIS — E119 Type 2 diabetes mellitus without complications: Secondary | ICD-10-CM | POA: Diagnosis not present

## 2023-01-20 DIAGNOSIS — D649 Anemia, unspecified: Secondary | ICD-10-CM

## 2023-01-20 DIAGNOSIS — I10 Essential (primary) hypertension: Secondary | ICD-10-CM | POA: Diagnosis not present

## 2023-01-20 DIAGNOSIS — K219 Gastro-esophageal reflux disease without esophagitis: Secondary | ICD-10-CM | POA: Diagnosis not present

## 2023-01-20 DIAGNOSIS — E785 Hyperlipidemia, unspecified: Secondary | ICD-10-CM | POA: Diagnosis not present

## 2023-01-20 LAB — CBC WITH DIFFERENTIAL/PLATELET
Abs Immature Granulocytes: 0.02 10*3/uL (ref 0.00–0.07)
Basophils Absolute: 0 10*3/uL (ref 0.0–0.1)
Basophils Relative: 1 %
Eosinophils Absolute: 0.1 10*3/uL (ref 0.0–0.5)
Eosinophils Relative: 2 %
HCT: 34.6 % — ABNORMAL LOW (ref 39.0–52.0)
Hemoglobin: 11 g/dL — ABNORMAL LOW (ref 13.0–17.0)
Immature Granulocytes: 0 %
Lymphocytes Relative: 25 %
Lymphs Abs: 1.6 10*3/uL (ref 0.7–4.0)
MCH: 30.6 pg (ref 26.0–34.0)
MCHC: 31.8 g/dL (ref 30.0–36.0)
MCV: 96.4 fL (ref 80.0–100.0)
Monocytes Absolute: 0.6 10*3/uL (ref 0.1–1.0)
Monocytes Relative: 10 %
Neutro Abs: 3.9 10*3/uL (ref 1.7–7.7)
Neutrophils Relative %: 62 %
Platelets: 436 10*3/uL — ABNORMAL HIGH (ref 150–400)
RBC: 3.59 MIL/uL — ABNORMAL LOW (ref 4.22–5.81)
RDW: 13.5 % (ref 11.5–15.5)
WBC: 6.3 10*3/uL (ref 4.0–10.5)
nRBC: 0 % (ref 0.0–0.2)

## 2023-01-20 LAB — IRON AND TIBC
Iron: 64 ug/dL (ref 45–182)
Saturation Ratios: 14 % — ABNORMAL LOW (ref 17.9–39.5)
TIBC: 451 ug/dL — ABNORMAL HIGH (ref 250–450)
UIBC: 387 ug/dL

## 2023-01-20 LAB — FERRITIN: Ferritin: 19 ng/mL — ABNORMAL LOW (ref 24–336)

## 2023-01-20 NOTE — Progress Notes (Signed)
Hematology/Oncology Consult note [facility]  [phone] [phone]  Patient Care Team: Berniece Salines, FNP as PCP - General (Nurse Practitioner) Iran Ouch, MD as PCP - Cardiology (Cardiology) Lanier Prude, MD as PCP - Electrophysiology (Cardiology) Solum, Marlana Salvage, MD as Physician Assistant (Endocrinology) Creig Hines, MD as Consulting Physician (Oncology) Riki Altes, MD (Urology) Galen Manila, MD as Referring Physician (Ophthalmology)   Name of the patient: Garrett Yoder  [id number]  [birth year]   Date of visit: 01/20/23  Diagnosis-  1. 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 Anemia likely due to chronic disease   Chief complaint/ Reason for visit-reestablish follow-up for thrombocytosis  Heme/Onc history: patient is a 77 year old Caucasian male with a past medical history significant for diabetes, GERD, hypertension hyperlipidemia anxiety among other medical problems he has been referred to [place] for thrombocytosis. Of note patient has always had thrombocytosis even dating back to 2013 when his platelet count was 484. Since then his platelet count had been ranging between 400's to 500s. In July 2018 his platelet count was 522 and then gradually rose to 566 seconds 728 and then 748 recently. White count has always been normal. He is also had a long-standing normocytic anemia and his hemoglobin ranges around 11. Most recent CBC from 11/14/2016 showed white count of 6.5, H&H of 10.2/31.7 with an MCV of 95 and a platelet count of 748. Iron studies show normal ferritin of 112 and iron saturation of 23% and TIBC was normal at 300  Results of blood work from 11/25/2016 were as follows: CBC showed white count of 5.6, H&H of 11/32.8 and a platelet count of 542. B12 was normal at 257 and folate was normal. Haptoglobin levels were normal. Reticulocyte count was mildly low at 0.9%. Jak 2 And MPL Were Negative. ESR Was Normal. Peripheral  Smear Review Showed Normal Morphology of WBCs RBCs and Platelets. CMP Was within Normal Limits. BCR Abl Testing Was Negative. Multiple Myeloma Panel Revealed No M Protein with Immunofixation Revealed Free Lambda Light Chains   Bone marrow biopsy on 03/17/2017 showed normocellular bone marrow with trilineage hematopoiesis.  Numerous lymphoid aggregates and normocytic normochromic anemia.  Bone marrow did not show any morphologic features of myeloproliferative or myelodysplastic neoplasm.  He did have numerous lymphoid aggregates.  Flow cytometry analysis showed a minor lymphoid population of B cells (22%) which were less than 5% of all cells and were coexpressing CD5 and CD20.  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  Cytogenetics and FISH studies were normal.  Plasma cells represented 2% of the cells and showed polyclonal staining pattern for kappa and lambda light chains and no evidence of plasma cell neoplasm in the bone marrow    Interval history-patient has developed bilateral lower extremity swelling over the last couple of weeks.  States that in the last 3 days it slowly getting better without any intervention.  ECOG PS- 1 Pain scale- 0   Review of systems- Review of Systems  Constitutional:  Positive for malaise/fatigue. Negative for chills, fever and weight loss.  HENT:  Negative for congestion, ear discharge and nosebleeds.   Eyes:  Negative for blurred vision.  Respiratory:  Negative for cough, hemoptysis, sputum production, shortness of breath and wheezing.   Cardiovascular:  Positive for leg swelling. Negative for chest pain, palpitations, orthopnea and claudication.  Gastrointestinal:  Negative for abdominal pain, blood in stool, constipation, diarrhea, heartburn, melena, nausea and vomiting.  Genitourinary:  Negative for dysuria,  flank pain, frequency, hematuria and urgency.  Musculoskeletal:  Negative for back pain, joint pain and myalgias.  Skin:   Negative for rash.  Neurological:  Negative for dizziness, tingling, focal weakness, seizures, weakness and headaches.  Endo/Heme/Allergies:  Does not bruise/bleed easily.  Psychiatric/Behavioral:  Negative for depression and suicidal ideas. The patient does not have insomnia.       Allergies  Allergen Reactions   Codeine Nausea Only    Other reaction(s): makes him sick to his stomach Other reaction(s): makes him sick to his stomach     Past Medical History:  Diagnosis Date   Anemia 08/14/2015   Anxiety    Aortic atherosclerosis (HCC) 11/25/2016   Chest CT Oct 2018   Aorto-iliac atherosclerosis (HCC) 11/25/2016   Abd/pelvic CT Oct 2018   Coronary artery calcification seen on CAT scan 11/25/2016   Chest CT Oct 2018; refer to Dr. Kirke Corin   DDD (degenerative disc disease), cervical 08/21/2016   Depression    Diabetes mellitus without complication (HCC)    Facet arthropathy, cervical 08/21/2016   GERD (gastroesophageal reflux disease)    Heart murmur    As child   History of echocardiogram    a. 01/2022 Echo: EF 50-55%, no rwma, nl RV fxn w/ RVSP 34.35mmHg, Mildly dil RA. Mild MR. Mod TR. AoV sclerosis w/o stenosis.   History of stress test    a. 01/2017 Ex MV: Poor ex capacity - held in stage I. 1mm Inflat ST depression. No ischemia/infarct. EF 55-65%.   Hyperlipidemia    Hypertension    Hypertrophy of prostate with urinary obstruction 11/25/2016   Pelvic CT October 2018   Incidental lung nodule, > 3mm and < 8mm 11/25/2016   5 mm nodule LLL, chest CT Oct 2018   Persistent atrial fibrillation (HCC)    a. 02/2022 Dx in 01/2022-->CHA2DS2VASc = 5-->Eliquis; b. 02/2022 s/p DCCV (150J->200J->RSR).   Thrombocytosis 08/14/2015     Past Surgical History:  Procedure Laterality Date   ANTERIOR CERVICAL DECOMP/DISCECTOMY FUSION N/A 09/24/2016   Procedure: ANTERIOR CERVICAL DECOMPRESSION/DISCECTOMY FUSION 3 LEVELS-C4-7;  Surgeon: Venetia Night, MD;  Location: [facility] ORS;  Service:  Neurosurgery;  Laterality: N/A;   CARDIOVERSION N/A 02/27/2022   Procedure: CARDIOVERSION;  Surgeon: Antonieta Iba, MD;  Location: [facility] OR[facility];  Service: Cardiovascular;  Laterality: N/A;   CATARACT EXTRACTION     ELECTROMAGNETIC NAVIGATION BROCHOSCOPY Right 04/07/2018   Procedure: ELECTROMAGNETIC NAVIGATION BRONCHOSCOPY;  Surgeon: Erin Fulling, MD;  Location: [facility] OR[facility];  Service: Cardiopulmonary;  Laterality: Right;   EYE SURGERY Right    Cataract Extraction with IOL   RETINAL LASER PROCEDURE      Social History   Socioeconomic History   Marital status: Widowed    Spouse name: [name]   Number of children: Not on file   Years of education: Not on file   Highest education level: Not on file  Occupational History   Occupation: retired  Tobacco Use   Smoking status: Former    Current packs/day: 0.00    Average packs/day: 1 pack/day for 30.0 years (30.0 ttl pk-yrs)    Types: Cigarettes    Start date: 07/18/1969    Quit date: 07/19/1999    Years since quitting: 23.5   Smokeless tobacco: Never  Vaping Use   Vaping status: Never Used  Substance and Sexual Activity   Alcohol use: No    Alcohol/week: 0.0 standard drinks of alcohol   Drug use: No   Sexual activity: Yes    Partners: Female  Other Topics Concern   Not on file  Social History Narrative   Not on file   Social Determinants of Health   Financial Resource Strain: Low Risk  (10/10/2022)   Overall Financial Resource Strain (CARDIA)    Difficulty of Paying Living Expenses: Not hard at all  Food Insecurity: No Food Insecurity (10/10/2022)   Hunger Vital Sign    Worried About Running Out of Food in the Last Year: Never true    Ran Out of Food in the Last Year: Never true  Transportation Needs: No Transportation Needs (10/10/2022)   PRAPARE - Administrator, Civil Service (Medical): No    Lack of Transportation (Non-Medical): No  Physical Activity: Sufficiently Active (10/10/2022)   Exercise Vital Sign    Days of  Exercise per Week: 5 days    Minutes of Exercise per Session: 60 min  Stress: No Stress Concern Present (10/10/2022)   [employer] of Occupational Health - Occupational Stress Questionnaire    Feeling of Stress : Not at all  Social Connections: Moderately Isolated (10/10/2022)   Social Connection and Isolation Panel [NHANES]    Frequency of Communication with Friends and Family: Twice a week    Frequency of Social Gatherings with Friends and Family: Twice a week    Attends Religious Services: More than 4 times per year    Active Member of [employer] or Organizations: No    Attends Banker Meetings: Never    Marital Status: Widowed  Intimate Partner Violence: Not At Risk (10/10/2022)   Humiliation, Afraid, Rape, and Kick questionnaire    Fear of Current or Ex-Partner: No    Emotionally Abused: No    Physically Abused: No    Sexually Abused: No    Family History  Problem Relation Age of Onset   Stroke Mother    Diabetes Mellitus II Father    Heart Problems Father    Heart disease Father    Diabetes Sister    Cancer Maternal Grandfather    Stroke Paternal Grandmother    Prostate cancer Neg Hx    Bladder Cancer Neg Hx    Kidney cancer Neg Hx      Current Outpatient Medications:    acetaminophen (TYLENOL) 500 MG tablet, Take 500 mg by mouth 2 (two) times daily as needed., Disp: , Rfl:    apixaban (ELIQUIS) 5 MG TABS tablet, Take 1 tablet (5 mg total) by mouth 2 (two) times daily., Disp: 14 tablet, Rfl: 0   atorvastatin (LIPITOR) 40 MG tablet, TAKE 1 TABLET BY MOUTH DAILY, Disp: 100 tablet, Rfl: 2   bimatoprost (LUMIGAN) 0.01 % SOLN, Place 1 drop into both eyes at bedtime. , Disp: , Rfl:    digoxin (LANOXIN) 0.125 MG tablet, Take 0.5 tablets (0.0625 mg total) by mouth daily., Disp: 45 tablet, Rfl: 3   finasteride (PROSCAR) 5 MG tablet, TAKE 1 TABLET BY MOUTH DAILY, Disp: 100 tablet, Rfl: 2   insulin detemir (LEVEMIR FLEXTOUCH) 100 UNIT/ML FlexPen, Inject 30 Units into  the skin every evening., Disp: , Rfl:    insulin lispro (HUMALOG) 100 UNIT/ML KwikPen, Inject into the skin daily. Pt taking 4 units in AM, 12 units at noon, Disp: , Rfl:    metFORMIN (GLUCOPHAGE) 1000 MG tablet, Take 1,000 mg by mouth 2 (two) times daily with a meal., Disp: , Rfl:    metoprolol succinate (TOPROL-XL) 100 MG 24 hr tablet, TAKE 1 TABLET BY MOUTH  DAILY . TAKE WITH OR IMMEDIATELY FOLLOWING  A MEAL, Disp: 180 tablet, Rfl: 3   Multiple Vitamin (MULTI-VITAMINS) TABS, Take 1 tablet by mouth 2 (two) times daily. , Disp: , Rfl:    pioglitazone (ACTOS) 15 MG tablet, Take 15 mg by mouth daily., Disp: , Rfl:    SIMBRINZA 1-0.2 % SUSP, Place 1 drop into both eyes 3 (three) times daily. , Disp: , Rfl:    tamsulosin (FLOMAX) 0.4 MG CAPS capsule, Take 1 capsule (0.4 mg total) by mouth daily., Disp: 90 capsule, Rfl: 3  Physical exam:  Vitals:   01/20/23 1511  BP: (!) 128/59  Pulse: 61  Resp: 18  Temp: (!) 97.3 F (36.3 C)  TempSrc: Tympanic  SpO2: 100%  Weight: 139 lb (63 kg)   Physical Exam Cardiovascular:     Rate and Rhythm: Normal rate. Rhythm irregular.     Heart sounds: Normal heart sounds.  Pulmonary:     Effort: Pulmonary effort is normal.     Breath sounds: Normal breath sounds.  Abdominal:     General: Bowel sounds are normal.     Palpations: Abdomen is soft.  Musculoskeletal:     Comments: Bilateral +1 edema  Skin:    General: Skin is warm and dry.  Neurological:     Mental Status: He is alert and oriented to person, place, and time.         Latest Ref Rng & Units 01/05/2023    1:58 PM  CMP  Glucose 65 - 99 mg/dL 61   BUN 7 - 25 mg/dL 16   Creatinine 6.28 - 1.28 mg/dL 3.15   Sodium 176 - 160 mmol/L 139   Potassium 3.5 - 5.3 mmol/L 4.5   Chloride 98 - 110 mmol/L 104   CO2 20 - 32 mmol/L 28   Calcium 8.6 - 10.3 mg/dL 9.6   Total Protein 6.1 - 8.1 g/dL 7.0   Total Bilirubin 0.2 - 1.2 mg/dL 0.4   AST 10 - 35 U/L 26   ALT 9 - 46 U/L 27       Latest Ref  Rng & Units 01/05/2023    1:58 PM  CBC  WBC 3.8 - 10.8 Thousand/uL 7.8   Hemoglobin 13.2 - 17.1 g/dL 73.7   Hematocrit 10.6 - 50.0 % 33.6   Platelets 140 - 400 Thousand/uL 592     No images are attached to the encounter.  US Venous Img Lower Bilateral (DVT)  Result Date: 01/05/2023 CLINICAL DATA:  77 year old male with bilateral lower extremity edema. EXAM: BILATERAL LOWER EXTREMITY VENOUS DOPPLER ULTRASOUND TECHNIQUE: Gray-scale sonography with graded compression, as well as color Doppler and duplex ultrasound were performed to evaluate the lower extremity deep venous systems from the level of the common femoral vein and including the common femoral, femoral, profunda femoral, popliteal and calf veins including the posterior tibial, peroneal and gastrocnemius veins when visible. The superficial great saphenous vein was also interrogated. Spectral Doppler was utilized to evaluate flow at rest and with distal augmentation maneuvers in the common femoral, femoral and popliteal veins. COMPARISON:  None Available. FINDINGS: RIGHT LOWER EXTREMITY Common Femoral Vein: No evidence of thrombus. Normal compressibility, respiratory phasicity and response to augmentation. Saphenofemoral Junction: No evidence of thrombus. Normal compressibility and flow on color Doppler imaging. Profunda Femoral Vein: No evidence of thrombus. Normal compressibility and flow on color Doppler imaging. Femoral Vein: No evidence of thrombus. Normal compressibility, respiratory phasicity and response to augmentation. Popliteal Vein: No evidence of thrombus. Normal compressibility, respiratory phasicity and response to augmentation.  Calf Veins: No evidence of thrombus. Normal compressibility and flow on color Doppler imaging. Other Findings:  Subcutaneous edema is noted about the calf. LEFT LOWER EXTREMITY Common Femoral Vein: No evidence of thrombus. Normal compressibility, respiratory phasicity and response to augmentation.  Saphenofemoral Junction: No evidence of thrombus. Normal compressibility and flow on color Doppler imaging. Profunda Femoral Vein: No evidence of thrombus. Normal compressibility and flow on color Doppler imaging. Femoral Vein: No evidence of thrombus. Normal compressibility, respiratory phasicity and response to augmentation. Popliteal Vein: No evidence of thrombus. Normal compressibility, respiratory phasicity and response to augmentation. Calf Veins: No evidence of thrombus. Normal compressibility and flow on color Doppler imaging. Other Findings:  Subcutaneous edema is noted about the calf. IMPRESSION: No evidence of bilateral lower extremity deep venous thrombosis. Marliss Coots, MD [facility] Electronically Signed   By: Marliss Coots M.D.   On: 01/05/2023 14:59     Assessment and plan- Patient is a 77 y.o. male here to reestablish follow-up for anemia and thrombocytosis  Thrombocytosis: Typically patient's platelet count ranges between low to mid 400s.  2 weeks ago his platelet count was higher than his baseline at 592.  This could be seen in the setting of acute stress/inflammation and is likely reactive given that his JAK2, CALR, MPL mutation as well as BCR-ABL testing has been negative in the past.  He had undergone bone marrow biopsy as well back in 2019 which did not show any evidence of myeloproliferative neoplasm.  I am inclined to monitor his platelet counts conservatively at this time.  CBC with differential in 3 and 6 months and I will see him back in 6 months  Normocytic anemia: Patient's baseline hemoglobin runs between 11-12 and is somewhat lower presently at 10.9 today.  I will be checking ferritin and iron studies B12 folate and TSH today  Leg swelling: I have asked him to follow-up with cardiology as his BNP was elevated.  He does have a history of A-fib as well.  Bilateral lower extremity Doppler was negative for DVT  recently   Visit Diagnosis 1. Normocytic anemia   2. Thrombocytosis      Dr. Owens Shark, MD, MPH [facility] [phone] 01/20/2023 3:53 PM

## 2023-01-21 ENCOUNTER — Telehealth: Payer: Self-pay | Admitting: Cardiology

## 2023-01-21 NOTE — Telephone Encounter (Signed)
Patient called to talk with Sherie Don about his PCP issues.

## 2023-01-21 NOTE — Telephone Encounter (Signed)
Called and spoke with patient. Patient with complaint of bilateral leg swelling and pain that started about 4 weeks ago. Patient reports that his PCP sent him for an Ultra Sound. Ultra Sound was negative for DVT. Patient states that he was then referred to the cancer center but was not sure why. Patient states that he was seen at the cancer center and his BNP was elevated. BNP was 184 on 01/05/23. Patient states that he was instructed to notify his cardiologist. Patient says that his leg swelling has improved and denies shortness of breath. Patient has an appointment with Sherie Don, NP on 02/04/23. Advised patient to call back if his legs swelling increases or shortness of breath. Patient verbalizes understanding. Will forward to provider for review.

## 2023-01-30 ENCOUNTER — Telehealth: Payer: Self-pay | Admitting: *Deleted

## 2023-01-30 NOTE — Telephone Encounter (Signed)
I told the pt. That with the iron studies he has last week and Smith Robert says that the ferritin 19 and she would like to see if pt could get IV iron. He says he is ok and he has  not got IV iron in past. He wanted to see if he can get oral iron.  He says that she sticks needle in hin 3 times a day for diabetes.  I told him that we would give him needle to give the iron in veins. He wants me to ask Smith Robert if she could start with oral iron. I will send message to Smith Robert and let him know next week and he is ok.

## 2023-02-03 NOTE — Progress Notes (Unsigned)
Cardiology Office Note Date:  02/04/2023  Patient ID:  Garrett Lundholm., DOB Dec 03, 1945, MRN [id number] PCP:  Berniece Salines, FNP  Cardiologist:  Lorine Bears, MD Electrophysiologist: Lanier Prude, MD    Chief Complaint: 3 mon Afib follow-up  History of Present Illness: Garrett Pullar. is a 77 y.o. male with PMH notable for perm AFib, CAD, HTN; seen today for Lanier Prude, MD for routine electrophysiology followup.  Diagnosed with afib 12/2021, DCCV 02/27/22 with return of AF on 1/16.  Last saw Dr. Lalla Brothers 03/2022, was asymptomatic from an A-fib perspective and so rate control was agreed upon.  I last saw him 10/2022 where his ventricular rates were generally well-controlled on 100mg  toprol BID and digoxin.   On follow-up today, his heart rates continue to stay in the 80-low 100s all the time.  His main complaint today is increased lower extremity edema. A few weeks ago, he was walking his dog and had bilateral thigh pain, noticed both thighs were very swollen and painful. The following day, his legs were normal-sized. Ever since then, he has had increased swelling from knee down. The edema is overall improved, but still present. He has tried to keep them elevated as much as he can. They are no longer painful, denies weeping, no red/warm areas. PCP ordered bilat venous u/s that did not reveal DVT He has reduced his physical activity because of the swelling. Has good appetite, no coughing or phlegm, continues to sleep on 2 pillows for comfort.  He denies chest pain, palpitations, dyspnea, PND, orthopnea, nausea, vomiting, dizziness, syncope, weight gain, or early satiety.     Past Medical History:  Diagnosis Date   Anemia 08/14/2015   Anxiety    Aortic atherosclerosis (HCC) 11/25/2016   Chest CT Oct 2018   Aorto-iliac atherosclerosis (HCC) 11/25/2016   Abd/pelvic CT Oct 2018   Coronary artery calcification seen on CAT scan 11/25/2016   Chest CT Oct 2018; refer to Dr.  Kirke Corin   DDD (degenerative disc disease), cervical 08/21/2016   Depression    Diabetes mellitus without complication (HCC)    Facet arthropathy, cervical 08/21/2016   GERD (gastroesophageal reflux disease)    Heart murmur    As child   History of echocardiogram    a. 01/2022 Echo: EF 50-55%, no rwma, nl RV fxn w/ RVSP 34.38mmHg, Mildly dil RA. Mild MR. Mod TR. AoV sclerosis w/o stenosis.   History of stress test    a. 01/2017 Ex MV: Poor ex capacity - held in stage I. 1mm Inflat ST depression. No ischemia/infarct. EF 55-65%.   Hyperlipidemia    Hypertension    Hypertrophy of prostate with urinary obstruction 11/25/2016   Pelvic CT October 2018   Incidental lung nodule, > 3mm and < 8mm 11/25/2016   5 mm nodule LLL, chest CT Oct 2018   Persistent atrial fibrillation (HCC)    a. 02/2022 Dx in 01/2022-->CHA2DS2VASc = 5-->Eliquis; b. 02/2022 s/p DCCV (150J->200J->RSR).   Thrombocytosis 08/14/2015    Past Surgical History:  Procedure Laterality Date   ANTERIOR CERVICAL DECOMP/DISCECTOMY FUSION N/A 09/24/2016   Procedure: ANTERIOR CERVICAL DECOMPRESSION/DISCECTOMY FUSION 3 LEVELS-C4-7;  Surgeon: Venetia Night, MD;  Location: [facility];  Service: Neurosurgery;  Laterality: N/A;   CARDIOVERSION N/A 02/27/2022   Procedure: CARDIOVERSION;  Surgeon: Antonieta Iba, MD;  Location: [facility];  Service: Cardiovascular;  Laterality: N/A;   CATARACT EXTRACTION     ELECTROMAGNETIC NAVIGATION BROCHOSCOPY Right 04/07/2018   Procedure: ELECTROMAGNETIC NAVIGATION  BRONCHOSCOPY;  Surgeon: Erin Fulling, MD;  Location: [facility];  Service: Cardiopulmonary;  Laterality: Right;   EYE SURGERY Right    Cataract Extraction with IOL   RETINAL LASER PROCEDURE      Current Outpatient Medications  Medication Instructions   acetaminophen (TYLENOL) 500 mg, 2 times daily PRN   apixaban (ELIQUIS) 5 mg, Oral, 2 times daily   atorvastatin (LIPITOR) 40 mg, Oral, Daily   bimatoprost (LUMIGAN) 0.01 % SOLN 1 drop, Daily  at bedtime   digoxin (LANOXIN) 0.0625 mg, Oral, Daily   finasteride (PROSCAR) 5 MG tablet TAKE 1 TABLET BY MOUTH DAILY   furosemide (LASIX) 20 mg, Oral, As needed   insulin lispro (HUMALOG) 100 UNIT/ML KwikPen Daily   Levemir FlexTouch 30 Units, Every evening   metFORMIN (GLUCOPHAGE) 1,000 mg, 2 times daily with meals   metoprolol succinate (TOPROL-XL) 100 MG 24 hr tablet TAKE 1 TABLET BY MOUTH  DAILY . TAKE WITH OR IMMEDIATELY FOLLOWING A MEAL   Multiple Vitamin (MULTI-VITAMINS) TABS 1 tablet, 2 times daily   pioglitazone (ACTOS) 15 mg, Daily   SIMBRINZA 1-0.2 % SUSP 1 drop, 3 times daily   tamsulosin (FLOMAX) 0.4 mg, Oral, Daily    Social History:  The patient  reports that he quit smoking about 23 years ago. His smoking use included cigarettes. He started smoking about 53 years ago. He has a 30 pack-year smoking history. He has never used smokeless tobacco. He reports that he does not drink alcohol and does not use drugs.   Family History:   The patient's family history includes Cancer in his maternal grandfather; Diabetes in his sister; Diabetes Mellitus II in his father; Heart Problems in his father; Heart disease in his father; Stroke in his mother and paternal grandmother.  ROS:  Please see the history of present illness. All other systems are reviewed and otherwise negative.   PHYSICAL EXAM:  VS:  BP 130/64 (BP Location: Left Arm, Patient Position: Sitting, Cuff Size: Normal)   Pulse 86   Ht 5\' 8"  (1.727 m)   Wt 138 lb 9.6 oz (62.9 kg)   SpO2 99%   BMI 21.07 kg/m  BMI: Body mass index is 21.07 kg/m.  GEN- The patient is well appearing, alert and oriented x 3 today.   Lungs- Clear to ausculation bilaterally, normal work of breathing.  Heart- Irregularly irregular marked tachycardic rhythm, no murmurs, rubs or gallops Extremities- 1-2+ peripheral edema, warm   EKG is ordered. Personal review of EKG from today shows: EKG Interpretation Date/Time:  Wednesday February 04 2023  10:38:18 EST Ventricular Rate:  86 PR Interval:    QRS Duration:  74 QT Interval:  344 QTC Calculation: 411 R Axis:   84  Text Interpretation: Atrial fibrillation with premature ventricular or aberrantly conducted complexes When compared with ECG of 13-Nov-2022 10:02, Vent. rate has decreased BY  48 BPM Confirmed by Garrett Yoder [phone]) on 02/04/2023 10:40:34 AM    Recent Labs: 02/21/2022: Magnesium 2.1 01/05/2023: ALT 27; Brain Natriuretic Peptide 184; BUN 16; Creat 1.27; Potassium 4.5; Sodium 139 01/20/2023: Hemoglobin 11.0; Platelets 436  07/03/2022: Cholesterol 124; HDL 49; LDL Cholesterol (Calc) 52; Total CHOL/HDL Ratio 2.5; Triglycerides 146   CrCl cannot be calculated (Patient's most recent lab result is older than the maximum 21 days allowed.).   Wt Readings from Last 3 Encounters:  02/04/23 138 lb 9.6 oz (62.9 kg)  01/20/23 139 lb (63 kg)  01/05/23 145 lb 6.4 oz (66 kg)  Additional studies reviewed include: Previous EP, cardiology notes.   TTE, 01/30/2022  1. Left ventricular ejection fraction, by estimation, is 50 to 55%. The left ventricle has low normal function. The left ventricle has no regional wall motion abnormalities. Left ventricular diastolic parameters are indeterminate.   2. Right ventricular systolic function is normal. The right ventricular size is normal. There is normal pulmonary artery systolic pressure. The estimated right ventricular systolic pressure is 34.2 mmHg.   3. Right atrial size was mildly dilated.   4. The mitral valve is normal in structure. Mild mitral valve regurgitation. No evidence of mitral stenosis.   5. Tricuspid valve regurgitation is moderate.   6. The aortic valve is normal in structure. Aortic valve regurgitation is not visualized. Aortic valve sclerosis/calcification is present, without any evidence of aortic stenosis.   7. The inferior vena cava is normal in size with greater than 50% respiratory variability, suggesting right  atrial pressure of 3 mmHg.   8. Rhythm is atrial fibrillation rate 90 to 130 bpm    ASSESSMENT AND PLAN:  #) perm AFib Ventricular rates much better controlled at home, ranging from 80-low 100s Continue toprol 100mg  daily Continue 0.125 digoxin daily   #) Hypercoag d/t AFib CHA2DS2-VASc Score = 5 [CHF History: 0, HTN History: 1, Diabetes History: 1, Stroke History: 0, Vascular Disease History: 1, Age Score: 2, Gender Score: 0].  Therefore, the patient's annual risk of stroke is 7.2 %. NOAC - eliquis 5mg  BID, appropriately dosed  No bleeding concerns    #) lower extremity edema Bilat ultrasound negative for DVT Most recent TTE with preserved LVEF Will start 20mg  lasix PRN, take 2 consecutive days Education patient that if he develops dizziness, presyncope, or otherwise feels unwell after the first day of lasix, to stop and notify office.    Current medicines are reviewed at length with the patient today.   The patient does not have concerns regarding his medicines.  The following changes were made today:   START 20mg  lasix PRN for lower extremity edema   Labs/ tests ordered today include:  Orders Placed This Encounter  Procedures   EKG 12-Lead     Disposition: Follow up with Dr. Lalla Brothers or EP APP in 6 months   Signed, Garrett Don, NP  02/04/23  12:40 PM  Electrophysiology [facility]

## 2023-02-04 ENCOUNTER — Ambulatory Visit: Payer: Medicare Other | Attending: Cardiology | Admitting: Cardiology

## 2023-02-04 ENCOUNTER — Encounter: Payer: Self-pay | Admitting: Cardiology

## 2023-02-04 VITALS — BP 130/64 | HR 86 | Ht 68.0 in | Wt 138.6 lb

## 2023-02-04 DIAGNOSIS — D6869 Other thrombophilia: Secondary | ICD-10-CM

## 2023-02-04 DIAGNOSIS — R600 Localized edema: Secondary | ICD-10-CM

## 2023-02-04 DIAGNOSIS — I4821 Permanent atrial fibrillation: Secondary | ICD-10-CM | POA: Diagnosis not present

## 2023-02-04 MED ORDER — FUROSEMIDE 20 MG PO TABS: 20.0000 mg | ORAL_TABLET | ORAL | 0 refills | Status: DC | PRN

## 2023-02-04 NOTE — Patient Instructions (Signed)
Medication Instructions:   Start: Furosemide 20 mg tablet next week; Take 1 tablet by mouth once daily for two days in a row.   *If you need a refill on your cardiac medications before your next appointment, please call your pharmacy*   Lab Work:  NONE  If you have labs (blood work) drawn today and your tests are completely normal, you will receive your results only by: MyChart Message (if you have MyChart) OR A paper copy in the mail If you have any lab test that is abnormal or we need to change your treatment, we will call you to review the results.   Testing/Procedures:  NONE   Follow-Up: At [facility], you and your health needs are our priority.  As part of our continuing mission to provide you with exceptional heart care, we have created designated Provider Care Teams.  These Care Teams include your primary Cardiologist (physician) and Advanced Practice Providers (APPs -  Physician Assistants and Nurse Practitioners) who all work together to provide you with the care you need, when you need it.  We recommend signing up for the patient portal called "MyChart".  Sign up information is provided on this After Visit Summary.  MyChart is used to connect with patients for Virtual Visits (Telemedicine).  Patients are able to view lab/test results, encounter notes, upcoming appointments, etc.  Non-urgent messages can be sent to your provider as well.   To learn more about what you can do with MyChart, go to [email or web address].    Your next appointment:   6 month(s)  Provider:   Sherie Don, NP

## 2023-02-06 ENCOUNTER — Encounter: Payer: Self-pay | Admitting: *Deleted

## 2023-02-10 ENCOUNTER — Telehealth: Payer: Self-pay | Admitting: *Deleted

## 2023-02-10 NOTE — Telephone Encounter (Signed)
Patient did speak to me about 3- weeks ago and his iron was low and he wanted to get oral med and no IV iron. I spoke to him today and I gave him the ferrous sulfate 325 mg and take it every other day with full stomach. I also told pt that that my chart has all the info also. He knows where he can get it and name and mg to get and side effects. He will call us if he has a problem with the drug he says.

## 2023-02-11 ENCOUNTER — Other Ambulatory Visit: Payer: Self-pay | Admitting: Cardiovascular Disease

## 2023-02-12 NOTE — Telephone Encounter (Signed)
Prescription refill request for Eliquis received. Indication: a fib Last office visit: 02/04/23 Scr: 1.27 epic 01/05/23 Age: 77 Weight: 62kg

## 2023-02-12 NOTE — Telephone Encounter (Signed)
Refill request

## 2023-02-17 ENCOUNTER — Ambulatory Visit: Payer: Medicare Other | Admitting: Cardiology

## 2023-02-20 LAB — HM DIABETES EYE EXAM

## 2023-03-26 ENCOUNTER — Other Ambulatory Visit: Payer: Self-pay | Admitting: Nurse Practitioner

## 2023-03-27 NOTE — Telephone Encounter (Signed)
 Requested medications are due for refill today.  yes  Requested medications are on the active medications list.  yes  Last refill. 03/27/2022 #90 3 rf  Future visit scheduled.   yes  Notes to clinic.  Expired labs.    Requested Prescriptions  Pending Prescriptions Disp Refills   tamsulosin  (FLOMAX ) 0.4 MG CAPS capsule [Pharmacy Med Name: Tamsulosin  HCl 0.4 MG Oral Capsule] 60 capsule 5    Sig: TAKE 1 CAPSULE BY MOUTH DAILY     Urology: Alpha-Adrenergic Blocker Failed - 03/27/2023  9:29 AM      Failed - PSA in normal range and within 360 days    Prostate Specific Ag, Serum  Date Value Ref Range Status  11/14/2016 2.6 0.0 - 4.0 ng/mL Final    Comment:    Roche ECLIA methodology. According to the American Urological Association, Serum PSA should decrease and remain at undetectable levels after radical prostatectomy. The AUA defines biochemical recurrence as an initial PSA value 0.2 ng/mL or greater followed by a subsequent confirmatory PSA value 0.2 ng/mL or greater. Values obtained with different assay methods or kits cannot be used interchangeably. Results cannot be interpreted as absolute evidence of the presence or absence of malignant disease.          Passed - Last BP in normal range    BP Readings from Last 1 Encounters:  02/04/23 130/64         Passed - Valid encounter within last 12 months    Recent Outpatient Visits           2 months ago Essential hypertension, benign   [facility] [name], FNP   8 months ago Mixed hyperlipidemia   [facility] [name], FNP   1 year ago Mixed hyperlipidemia   [facility] Gareth Mliss F, FNP   1 year ago Type 2 diabetes mellitus with hyperglycemia, with long-term current use of insulin  [facility])   [facility] [name], FNP   1 year ago Essential hypertension, benign   [facility] [name], FNP       Future Appointments             In 3 months Gareth, Mliss FALCON, FNP [facility], [facility]

## 2023-04-13 ENCOUNTER — Other Ambulatory Visit: Payer: Self-pay | Admitting: Nurse Practitioner

## 2023-04-13 DIAGNOSIS — I10 Essential (primary) hypertension: Secondary | ICD-10-CM

## 2023-04-14 NOTE — Telephone Encounter (Signed)
 Unable to refill per protocol, Rx expired. Discontinued 01/02/22.  Requested Prescriptions  Pending Prescriptions Disp Refills   irbesartan (AVAPRO) 150 MG tablet [Pharmacy Med Name: Irbesartan 150 MG Oral Tablet] 100 tablet 2    Sig: TAKE 1 TABLET BY MOUTH DAILY     Cardiovascular:  Angiotensin Receptor Blockers Passed - 04/14/2023 11:45 AM      Passed - Cr in normal range and within 180 days    Creat  Date Value Ref Range Status  01/05/2023 1.27 0.70 - 1.28 mg/dL Final   Creatinine, Urine  Date Value Ref Range Status  07/04/2022 122 20 - 320 mg/dL Final         Passed - K in normal range and within 180 days    Potassium  Date Value Ref Range Status  01/05/2023 4.5 3.5 - 5.3 mmol/L Final         Passed - Patient is not pregnant      Passed - Last BP in normal range    BP Readings from Last 1 Encounters:  02/04/23 130/64         Passed - Valid encounter within last 6 months    Recent Outpatient Visits           3 months ago Essential hypertension, benign   [facility] Berniece Salines, FNP   9 months ago Mixed hyperlipidemia   [facility] Berniece Salines, FNP   1 year ago Mixed hyperlipidemia   [facility] Della Goo F, FNP   1 year ago Type 2 diabetes mellitus with hyperglycemia, with long-term current use of insulin [facility])   [facility] Berniece Salines, FNP   2 years ago Essential hypertension, benign   [facility] Berniece Salines, FNP       Future Appointments             In 2 months Zane Herald, Rudolpho Sevin, FNP [facility], [facility]

## 2023-04-17 ENCOUNTER — Other Ambulatory Visit: Payer: Self-pay | Admitting: *Deleted

## 2023-04-17 DIAGNOSIS — D649 Anemia, unspecified: Secondary | ICD-10-CM

## 2023-04-20 ENCOUNTER — Telehealth: Payer: Self-pay | Admitting: Oncology

## 2023-04-20 ENCOUNTER — Inpatient Hospital Stay: Payer: Medicare Other

## 2023-04-20 NOTE — Telephone Encounter (Signed)
 Patient called to reschedule lab appointment.  Rescheduled as requested note sent to lab.

## 2023-04-26 ENCOUNTER — Inpatient Hospital Stay

## 2023-04-26 ENCOUNTER — Inpatient Hospital Stay
Admission: EM | Admit: 2023-04-26 | Discharge: 2023-04-28 | DRG: 871 | Disposition: A | Discharge status: Home / Self Care | Attending: Osteopathic Medicine | Admitting: Osteopathic Medicine

## 2023-04-26 ENCOUNTER — Emergency Department

## 2023-04-26 ENCOUNTER — Other Ambulatory Visit: Payer: Self-pay

## 2023-04-26 DIAGNOSIS — A419 Sepsis, unspecified organism: Secondary | ICD-10-CM | POA: Diagnosis present

## 2023-04-26 DIAGNOSIS — Z87891 Personal history of nicotine dependence: Secondary | ICD-10-CM

## 2023-04-26 DIAGNOSIS — K219 Gastro-esophageal reflux disease without esophagitis: Secondary | ICD-10-CM | POA: Diagnosis present

## 2023-04-26 DIAGNOSIS — I2510 Atherosclerotic heart disease of native coronary artery without angina pectoris: Secondary | ICD-10-CM | POA: Diagnosis present

## 2023-04-26 DIAGNOSIS — Z8249 Family history of ischemic heart disease and other diseases of the circulatory system: Secondary | ICD-10-CM

## 2023-04-26 DIAGNOSIS — I10 Essential (primary) hypertension: Secondary | ICD-10-CM | POA: Diagnosis present

## 2023-04-26 DIAGNOSIS — R6510 Systemic inflammatory response syndrome (SIRS) of non-infectious origin without acute organ dysfunction: Secondary | ICD-10-CM | POA: Diagnosis not present

## 2023-04-26 DIAGNOSIS — Z7901 Long term (current) use of anticoagulants: Secondary | ICD-10-CM | POA: Diagnosis not present

## 2023-04-26 DIAGNOSIS — B9789 Other viral agents as the cause of diseases classified elsewhere: Secondary | ICD-10-CM | POA: Diagnosis present

## 2023-04-26 DIAGNOSIS — R739 Hyperglycemia, unspecified: Secondary | ICD-10-CM | POA: Diagnosis not present

## 2023-04-26 DIAGNOSIS — Z794 Long term (current) use of insulin: Secondary | ICD-10-CM

## 2023-04-26 DIAGNOSIS — I129 Hypertensive chronic kidney disease with stage 1 through stage 4 chronic kidney disease, or unspecified chronic kidney disease: Secondary | ICD-10-CM | POA: Diagnosis present

## 2023-04-26 DIAGNOSIS — N401 Enlarged prostate with lower urinary tract symptoms: Secondary | ICD-10-CM | POA: Diagnosis present

## 2023-04-26 DIAGNOSIS — F419 Anxiety disorder, unspecified: Secondary | ICD-10-CM | POA: Diagnosis present

## 2023-04-26 DIAGNOSIS — E785 Hyperlipidemia, unspecified: Secondary | ICD-10-CM | POA: Diagnosis present

## 2023-04-26 DIAGNOSIS — N1832 Chronic kidney disease, stage 3b: Secondary | ICD-10-CM | POA: Diagnosis present

## 2023-04-26 DIAGNOSIS — F32A Depression, unspecified: Secondary | ICD-10-CM | POA: Diagnosis present

## 2023-04-26 DIAGNOSIS — Z9841 Cataract extraction status, right eye: Secondary | ICD-10-CM

## 2023-04-26 DIAGNOSIS — I4821 Permanent atrial fibrillation: Secondary | ICD-10-CM | POA: Diagnosis present

## 2023-04-26 DIAGNOSIS — G9341 Metabolic encephalopathy: Secondary | ICD-10-CM | POA: Diagnosis present

## 2023-04-26 DIAGNOSIS — Z7984 Long term (current) use of oral hypoglycemic drugs: Secondary | ICD-10-CM | POA: Diagnosis not present

## 2023-04-26 DIAGNOSIS — Z681 Body mass index (BMI) 19 or less, adult: Secondary | ICD-10-CM | POA: Diagnosis not present

## 2023-04-26 DIAGNOSIS — Z823 Family history of stroke: Secondary | ICD-10-CM

## 2023-04-26 DIAGNOSIS — R636 Underweight: Secondary | ICD-10-CM | POA: Diagnosis present

## 2023-04-26 DIAGNOSIS — I4891 Unspecified atrial fibrillation: Secondary | ICD-10-CM | POA: Diagnosis present

## 2023-04-26 DIAGNOSIS — Z833 Family history of diabetes mellitus: Secondary | ICD-10-CM

## 2023-04-26 DIAGNOSIS — Z961 Presence of intraocular lens: Secondary | ICD-10-CM | POA: Diagnosis present

## 2023-04-26 DIAGNOSIS — G9340 Encephalopathy, unspecified: Secondary | ICD-10-CM | POA: Diagnosis not present

## 2023-04-26 DIAGNOSIS — Z79899 Other long term (current) drug therapy: Secondary | ICD-10-CM | POA: Diagnosis not present

## 2023-04-26 DIAGNOSIS — R351 Nocturia: Secondary | ICD-10-CM | POA: Diagnosis present

## 2023-04-26 DIAGNOSIS — E1122 Type 2 diabetes mellitus with diabetic chronic kidney disease: Secondary | ICD-10-CM | POA: Diagnosis present

## 2023-04-26 DIAGNOSIS — E1165 Type 2 diabetes mellitus with hyperglycemia: Secondary | ICD-10-CM | POA: Diagnosis present

## 2023-04-26 DIAGNOSIS — I4819 Other persistent atrial fibrillation: Secondary | ICD-10-CM

## 2023-04-26 LAB — CBC WITH DIFFERENTIAL/PLATELET
Abs Immature Granulocytes: 0.07 10*3/uL (ref 0.00–0.07)
Basophils Absolute: 0 10*3/uL (ref 0.0–0.1)
Basophils Relative: 0 %
Eosinophils Absolute: 0 10*3/uL (ref 0.0–0.5)
Eosinophils Relative: 0 %
HCT: 32 % — ABNORMAL LOW (ref 39.0–52.0)
Hemoglobin: 10.6 g/dL — ABNORMAL LOW (ref 13.0–17.0)
Immature Granulocytes: 1 %
Lymphocytes Relative: 4 %
Lymphs Abs: 0.5 10*3/uL — ABNORMAL LOW (ref 0.7–4.0)
MCH: 31 pg (ref 26.0–34.0)
MCHC: 33.1 g/dL (ref 30.0–36.0)
MCV: 93.6 fL (ref 80.0–100.0)
Monocytes Absolute: 0.9 10*3/uL (ref 0.1–1.0)
Monocytes Relative: 7 %
Neutro Abs: 11.7 10*3/uL — ABNORMAL HIGH (ref 1.7–7.7)
Neutrophils Relative %: 88 %
Platelets: 333 10*3/uL (ref 150–400)
RBC: 3.42 MIL/uL — ABNORMAL LOW (ref 4.22–5.81)
RDW: 13.5 % (ref 11.5–15.5)
WBC: 13.3 10*3/uL — ABNORMAL HIGH (ref 4.0–10.5)
nRBC: 0 % (ref 0.0–0.2)

## 2023-04-26 LAB — COMPREHENSIVE METABOLIC PANEL
ALT: 22 U/L (ref 0–44)
AST: 24 U/L (ref 15–41)
Albumin: 3.9 g/dL (ref 3.5–5.0)
Alkaline Phosphatase: 96 U/L (ref 38–126)
Anion gap: 10 (ref 5–15)
BUN: 19 mg/dL (ref 8–23)
CO2: 24 mmol/L (ref 22–32)
Calcium: 8.4 mg/dL — ABNORMAL LOW (ref 8.9–10.3)
Chloride: 98 mmol/L (ref 98–111)
Creatinine, Ser: 1.36 mg/dL — ABNORMAL HIGH (ref 0.61–1.24)
GFR, Estimated: 54 mL/min — ABNORMAL LOW (ref >60)
Glucose, Bld: 353 mg/dL — ABNORMAL HIGH (ref 70–99)
Potassium: 4.2 mmol/L (ref 3.5–5.1)
Sodium: 132 mmol/L — ABNORMAL LOW (ref 135–145)
Total Bilirubin: 0.9 mg/dL (ref 0.0–1.2)
Total Protein: 6.7 g/dL (ref 6.5–8.1)

## 2023-04-26 LAB — HEMOGLOBIN A1C
Hgb A1c MFr Bld: 7.8 % — ABNORMAL HIGH (ref 4.8–5.6)
Mean Plasma Glucose: 177.16 mg/dL

## 2023-04-26 LAB — RESP PANEL BY RT-PCR (RSV, FLU A&B, COVID)  RVPGX2
Influenza A by PCR: NEGATIVE
Influenza B by PCR: NEGATIVE
Resp Syncytial Virus by PCR: NEGATIVE
SARS Coronavirus 2 by RT PCR: NEGATIVE

## 2023-04-26 LAB — URINALYSIS, W/ REFLEX TO CULTURE (INFECTION SUSPECTED)
Bacteria, UA: NONE SEEN
Bilirubin Urine: NEGATIVE
Glucose, UA: >=500 mg/dL — AB
Ketones, ur: 5 mg/dL — AB
Leukocytes,Ua: NEGATIVE
Nitrite: NEGATIVE
Protein, ur: 30 mg/dL — AB
Specific Gravity, Urine: 1.021 (ref 1.005–1.030)
pH: 7 (ref 5.0–8.0)

## 2023-04-26 LAB — PROTIME-INR
INR: 1.1 (ref 0.8–1.2)
Prothrombin Time: 14.8 s (ref 11.4–15.2)

## 2023-04-26 LAB — CBG MONITORING, ED
Glucose-Capillary: 353 mg/dL — ABNORMAL HIGH (ref 70–99)
Glucose-Capillary: 392 mg/dL — ABNORMAL HIGH (ref 70–99)

## 2023-04-26 LAB — APTT: aPTT: 31 s (ref 24–36)

## 2023-04-26 LAB — AMMONIA: Ammonia: 20 umol/L (ref 9–35)

## 2023-04-26 LAB — LACTIC ACID, PLASMA
Lactic Acid, Venous: 2.2 mmol/L (ref 0.5–1.9)
Lactic Acid, Venous: 2 mmol/L (ref 0.5–1.9)

## 2023-04-26 MED ORDER — INSULIN ASPART 100 UNIT/ML IJ SOLN
0.0000 [IU] | INTRAMUSCULAR | Status: DC
  Administered 2023-04-26: 9 [IU] via SUBCUTANEOUS
  Administered 2023-04-27: 3 [IU] via SUBCUTANEOUS
  Administered 2023-04-27: 2 [IU] via SUBCUTANEOUS
  Filled 2023-04-26 (×3): qty 1

## 2023-04-26 MED ORDER — ACETAMINOPHEN 325 MG PO TABS
650.0000 mg | ORAL_TABLET | Freq: Once | ORAL | Status: AC
  Administered 2023-04-26: 650 mg via ORAL
  Filled 2023-04-26: qty 2

## 2023-04-26 MED ORDER — ONDANSETRON HCL 4 MG PO TABS: 4.0000 mg | ORAL_TABLET | Freq: Four times a day (QID) | ORAL | Status: DC | PRN

## 2023-04-26 MED ORDER — INSULIN GLARGINE 100 UNIT/ML ~~LOC~~ SOLN
10.0000 [IU] | Freq: Every day | SUBCUTANEOUS | Status: DC
  Administered 2023-04-26 – 2023-04-27 (×2): 10 [IU] via SUBCUTANEOUS
  Filled 2023-04-26 (×3): qty 0.1

## 2023-04-26 MED ORDER — DILTIAZEM HCL 25 MG/5ML IV SOLN: 10.0000 mg | Freq: Once | INTRAVENOUS | Status: AC

## 2023-04-26 MED ORDER — VANCOMYCIN HCL 750 MG/150ML IV SOLN
750.0000 mg | INTRAVENOUS | Status: DC
  Administered 2023-04-27: 750 mg via INTRAVENOUS
  Filled 2023-04-26: qty 150

## 2023-04-26 MED ORDER — LACTATED RINGERS IV SOLN: INTRAVENOUS | Status: AC

## 2023-04-26 MED ORDER — SODIUM CHLORIDE 0.9 % IV BOLUS
1000.0000 mL | Freq: Once | INTRAVENOUS | Status: AC
Start: 2023-04-26 — End: 2023-04-26
  Administered 2023-04-26: 1000 mL via INTRAVENOUS

## 2023-04-26 MED ORDER — DILTIAZEM HCL-DEXTROSE 125-5 MG/125ML-% IV SOLN (PREMIX)
5.0000 mg/h | INTRAVENOUS | Status: DC
  Administered 2023-04-26: 5 mg/h via INTRAVENOUS
  Filled 2023-04-26: qty 125

## 2023-04-26 MED ORDER — CEFEPIME HCL 2 G IV SOLR
2.0000 g | Freq: Two times a day (BID) | INTRAVENOUS | Status: DC
  Administered 2023-04-26 – 2023-04-27 (×3): 2 g via INTRAVENOUS
  Filled 2023-04-26 (×4): qty 12.5

## 2023-04-26 MED ORDER — APIXABAN 5 MG PO TABS
5.0000 mg | ORAL_TABLET | Freq: Two times a day (BID) | ORAL | Status: DC
  Administered 2023-04-26 – 2023-04-28 (×4): 5 mg via ORAL
  Filled 2023-04-26 (×4): qty 1

## 2023-04-26 MED ORDER — DILTIAZEM HCL 25 MG/5ML IV SOLN
INTRAVENOUS | Status: AC
  Administered 2023-04-26: 10 mg via INTRAVENOUS
  Filled 2023-04-26: qty 5

## 2023-04-26 MED ORDER — VANCOMYCIN HCL IN DEXTROSE 1-5 GM/200ML-% IV SOLN
1000.0000 mg | Freq: Once | INTRAVENOUS | Status: AC
  Administered 2023-04-26: 1000 mg via INTRAVENOUS
  Filled 2023-04-26: qty 200

## 2023-04-26 MED ORDER — VANCOMYCIN HCL 500 MG/100ML IV SOLN
500.0000 mg | Freq: Once | INTRAVENOUS | Status: AC
  Administered 2023-04-26: 500 mg via INTRAVENOUS
  Filled 2023-04-26: qty 100

## 2023-04-26 MED ORDER — ONDANSETRON HCL 4 MG/2ML IJ SOLN
4.0000 mg | Freq: Four times a day (QID) | INTRAMUSCULAR | Status: DC | PRN
Start: 2023-04-26 — End: 2023-04-28

## 2023-04-26 MED ORDER — INSULIN ASPART 100 UNIT/ML IJ SOLN: 0.0000 [IU] | INTRAMUSCULAR | Status: DC

## 2023-04-26 MED ORDER — LACTATED RINGERS IV SOLN
150.0000 mL/h | INTRAVENOUS | Status: AC
  Administered 2023-04-26: 150 mL/h via INTRAVENOUS

## 2023-04-26 MED ORDER — SODIUM CHLORIDE 0.9 % IV SOLN
2.0000 g | Freq: Once | INTRAVENOUS | Status: AC
  Administered 2023-04-26: 2 g via INTRAVENOUS
  Filled 2023-04-26: qty 20

## 2023-04-26 NOTE — Consult Note (Signed)
 Pharmacy Antibiotic Note  Garrett Yoder. is a 78 y.o. male admitted on 04/26/2023 with sepsis.  Pharmacy has been consulted for cefepime and vancomycin dosing.  WBC 13.3, Tmax 100.7  Vancomycin 1000 mg IV x 1 given 3/9 @ 1735  Plan: Give additional vancomycin 500 mg IV x 1 to complete 1500 mg loading dose, then start 750 mg IV every 24 hours Estimated AUC 485, Cmin 13.1 59 kg, Scr 1.36, Vd 0.72 Vancomycin levels at steady state or as clinically indicated Start cefepime 2 grams IV every 12 hours Follow renal function and cultures for adjustments   Height: 5\' 8"  (172.7 cm) Weight: 59 kg (130 lb) IBW/kg (Calculated) : 68.4  Temp (24hrs), Avg:100.7 F (38.2 C), Min:100.7 F (38.2 C), Max:100.7 F (38.2 C)  Recent Labs  Lab 04/26/23 1500 04/26/23 1520 04/26/23 1643  WBC  --  13.3*  --   CREATININE 1.36*  --   --   LATICACIDVEN  --  2.2* 2.0*    Estimated Creatinine Clearance: 38.6 mL/min (A) (by C-G formula based on SCr of 1.36 mg/dL (H)).    Allergies  Allergen Reactions   Codeine Nausea Only    Other reaction(s): makes him sick to his stomach Other reaction(s): makes him sick to his stomach    Antimicrobials this admission: vancomycin 3/9 >>  cefepime 3/9 >>  Ceftriaxone x 1 3/9  Dose adjustments this admission: N/A  Microbiology results: 3/9 BCx: pending   Thank you for allowing pharmacy to be a part of this patient's care.  Barrie Folk, PharmD 04/26/2023 6:04 PM

## 2023-04-26 NOTE — Assessment & Plan Note (Signed)
 Blood sugars predominantly in 300s without overt DKA/HHS  SSI  A1C  Monitor

## 2023-04-26 NOTE — ED Notes (Signed)
 Pt is a hard stick. Attempted Iv x3. Able to get another Iv 22 in the RAC with difficulty. All sepsis blood work obtained.

## 2023-04-26 NOTE — ED Notes (Signed)
 Pt HR running between 80-98bpm since admin of dilt.

## 2023-04-26 NOTE — Assessment & Plan Note (Signed)
 Cr 1.36 w/ GFR in the 50s At baseline  Monitor

## 2023-04-26 NOTE — Consult Note (Signed)
 CODE SEPSIS - PHARMACY COMMUNICATION  **Broad Spectrum Antibiotics should be administered within 1 hour of Sepsis diagnosis**  Time Code Sepsis Called/Page Received: 1429  Antibiotics Ordered: Ceftriaxone  Time of 1st antibiotic administration: 1520  Additional action taken by pharmacy: Messaged RN  If necessary, Name of Provider/Nurse Contacted: Edson Snowball, PharmD Pharmacy Resident  04/26/2023 2:30 PM

## 2023-04-26 NOTE — Assessment & Plan Note (Signed)
 BP stable Titrate home regimen

## 2023-04-26 NOTE — Assessment & Plan Note (Signed)
 Cont flomax

## 2023-04-26 NOTE — Assessment & Plan Note (Signed)
 PPI ?

## 2023-04-26 NOTE — Assessment & Plan Note (Signed)
 Meeting SIRS criteria w/ T 100.7, HR 140s-Afib  WBC 13.3 Lactate 2.2-->2.0 No identifiable source of infection at present  CXR WNL  UA non indicative of infection  Covid, flu, RSV negative  Will place on empiric antibiotic coverage-deescalate as appropriate   Panculture  Monitor

## 2023-04-26 NOTE — ED Notes (Signed)
 Pt had 1 bm and was given new linen, warm, blanket, and disposable underwear. Pt is comfortable and in no distress.

## 2023-04-26 NOTE — ED Provider Notes (Signed)
 [facility] Provider Note    Event Date/Time   First MD Initiated Contact with Patient 04/26/23 1415     (approximate)   History   Hyperglycemia   HPI  Garrett Yoder. is a 78 y.o. male with a history of A-fib as well as hyperglycemia lives at home alone presents to the ER for evaluation of difficult to control blood sugars over the past 2 3 days.  He denies any cough or congestion.  Denies any pain.  States blood sugars have been in the 300s.     Physical Exam   Triage Vital Signs: ED Triage Vitals [04/26/23 1420]  Encounter Vitals Group     BP      Systolic BP Percentile      Diastolic BP Percentile      Pulse      Resp      Temp      Temp src      SpO2      Weight 130 lb (59 kg)     Height 5\' 8"  (1.727 m)     Head Circumference      Peak Flow      Pain Score 0     Pain Loc      Pain Education      Exclude from Growth Chart     Most recent vital signs: Vitals:   04/26/23 1422  BP: 123/72  Pulse: (!) 126  Resp: 20  Temp: (!) 100.7 F (38.2 C)  SpO2: 100%     Constitutional: Alert  Eyes: Conjunctivae are normal.  Head: Atraumatic. Nose: No congestion/rhinnorhea. Mouth/Throat: Mucous membranes are moist.   Neck: Painless ROM.  Cardiovascular:   Good peripheral circulation. Respiratory: Normal respiratory effort.  No retractions.  Gastrointestinal: Soft and nontender.  Musculoskeletal:  no deformity Neurologic:  MAE spontaneously. No gross focal neurologic deficits are appreciated.  Skin:  Skin is warm, dry and intact. No rash noted.      ED Results / Procedures / Treatments   Labs (all labs ordered are listed, but only abnormal results are displayed) Labs Reviewed  CBG MONITORING, ED - Abnormal; Notable for the following components:      Result Value   Glucose-Capillary 353 (*)    All other components within normal limits  RESP PANEL BY RT-PCR (RSV, FLU A&B, COVID)  RVPGX2  CULTURE, BLOOD (ROUTINE X 2)   CULTURE, BLOOD (ROUTINE X 2)  LACTIC ACID, PLASMA  LACTIC ACID, PLASMA  COMPREHENSIVE METABOLIC PANEL  URINALYSIS, W/ REFLEX TO CULTURE (INFECTION SUSPECTED)  PROTIME-INR  APTT     EKG  ED ECG REPORT I, Willy Eddy, the attending physician, personally viewed and interpreted this ECG.   Date: 04/26/2023  EKG Time: 14:23  Rate: 125  Rhythm: afib with rvr  Axis: normal  Intervals: normal  ST&T Change: non specific st abn    RADIOLOGY Please see ED Course for my review and interpretation.  I personally reviewed all radiographic images ordered to evaluate for the above acute complaints and reviewed radiology reports and findings.  These findings were personally discussed with the patient.  Please see medical record for radiology report.    PROCEDURES:  Critical Care performed: No  Procedures   MEDICATIONS ORDERED IN ED: Medications  sodium chloride 0.9 % bolus 1,000 mL (has no administration in time range)  lactated ringers infusion (has no administration in time range)  cefTRIAXone (ROCEPHIN) 2 g in sodium chloride 0.9 % 100 mL  IVPB (has no administration in time range)  acetaminophen (TYLENOL) tablet 650 mg (has no administration in time range)     IMPRESSION / MDM / ASSESSMENT AND PLAN / ED COURSE  I reviewed the triage vital signs and the nursing notes.                              Differential diagnosis includes, but is not limited to, Dehydration, sepsis, pna, uti, hypoglycemia, cva, drug effect, dysrhythmia  Patient presenting to the ER for evaluation of symptoms as described above.  Based on symptoms, risk factors and considered above differential, this presenting complaint could reflect a potentially life-threatening illness therefore the patient will be placed on continuous pulse oximetry and telemetry for monitoring.  Laboratory evaluation will be sent to evaluate for the above complaints.  Will order IV voids.  Patient does have known A-fib.   Normotensive is febrile.  Likely septic.  Blood work to be sent for the but differential.  Have ordered Rocephin IV.  Abdominal exam is soft benign.  Patient will be signed out to oncoming physician for follow-up labs and disposition.        FINAL CLINICAL IMPRESSION(S) / ED DIAGNOSES   Final diagnoses:  Sepsis without acute organ dysfunction, due to unspecified organism [facility])  Atrial fibrillation with RVR (HCC)     Rx / DC Orders   ED Discharge Orders     None        Note:  This document was prepared using Dragon voice recognition software and may include unintentional dictation errors.    Willy Eddy, MD 04/26/23 [phone]

## 2023-04-26 NOTE — Assessment & Plan Note (Signed)
 Baseline atrial fibrillation on eliquis  Now w/ HR into 140s in setting of SIRS, hyperglycemia  S/p IV diltiazem x 10mg  x 1 IV dilt gtt 2D ECHO  Monitor  Consult cardiology as appropriate

## 2023-04-26 NOTE — ED Triage Notes (Signed)
 Pt via [facility] from home. 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 Glucose has been fluctuating for the past month. Denies any complaints, no recent infection. Pt takes insulin and oral DM medications. EMS reports a fib RVR 80-160s, with hx and takes blood thinners. Pt is A&Ox4 and NAD 18 G R forearm, EMS gave bolus 99.0 orally 115/73 98% on RA

## 2023-04-26 NOTE — H&P (Addendum)
 History and Physical    Patient: Garrett Yoder. WUJ:[id number] DOB: 1946-01-21 DOA: 04/26/2023 DOS: the patient was seen and examined on 04/26/2023 PCP: Berniece Salines, FNP  Patient coming from: Home  Chief Complaint:  Chief Complaint  Patient presents with   Hyperglycemia   HPI: Drezden Seitzinger. is a 78 y.o. male with medical history significant of atrial fibrillation, type 2 diabetes, hypertension, hyperlipidemia presenting with SIRS, A-fib with RVR, hyperglycemia, encephalopathy.  Limited history in the setting of encephalopathy.  Per report, patient states having elevated blood sugars at home though he cannot specify.  No reports of cough, shortness of breath, malaise or other symptoms.  Does not report any recent medication changes though he cannot specify.  No chest pain.  No abdominal pain or diarrhea.  Positive mild generalized fatigue.  No reported falls or recent head trauma.  Patient does live at home by himself. Presented to the ER Tmax 100.7, heart rate into the 140s, BP stable.  Satting well on room air.  White count 13.3, hemoglobin 10.6, platelets 333, COVID flu and RSV negative.  Lactate 2.2-->2.0.  Creatinine 1.36.  Glucose 353.  Urinalysis not indicative of infection.  Chest x-ray within normal limits. Review of Systems: As mentioned in the history of present illness. All other systems reviewed and are negative. Past Medical History:  Diagnosis Date   Anemia 08/14/2015   Anxiety    Aortic atherosclerosis (HCC) 11/25/2016   Chest CT Oct 2018   Aorto-iliac atherosclerosis (HCC) 11/25/2016   Abd/pelvic CT Oct 2018   Coronary artery calcification seen on CAT scan 11/25/2016   Chest CT Oct 2018; refer to Dr. Kirke Corin   DDD (degenerative disc disease), cervical 08/21/2016   Depression    Diabetes mellitus without complication (HCC)    Facet arthropathy, cervical 08/21/2016   GERD (gastroesophageal reflux disease)    Heart murmur    As child   History of echocardiogram     a. 01/2022 Echo: EF 50-55%, no rwma, nl RV fxn w/ RVSP 34.58mmHg, Mildly dil RA. Mild MR. Mod TR. AoV sclerosis w/o stenosis.   History of stress test    a. 01/2017 Ex MV: Poor ex capacity - held in stage I. 1mm Inflat ST depression. No ischemia/infarct. EF 55-65%.   Hyperlipidemia    Hypertension    Hypertrophy of prostate with urinary obstruction 11/25/2016   Pelvic CT October 2018   Incidental lung nodule, > 3mm and < 8mm 11/25/2016   5 mm nodule LLL, chest CT Oct 2018   Persistent atrial fibrillation (HCC)    a. 02/2022 Dx in 01/2022-->CHA2DS2VASc = 5-->Eliquis; b. 02/2022 s/p DCCV (150J->200J->RSR).   Thrombocytosis 08/14/2015   Past Surgical History:  Procedure Laterality Date   ANTERIOR CERVICAL DECOMP/DISCECTOMY FUSION N/A 09/24/2016   Procedure: ANTERIOR CERVICAL DECOMPRESSION/DISCECTOMY FUSION 3 LEVELS-C4-7;  Surgeon: Venetia Night, MD;  Location: [facility];  Service: Neurosurgery;  Laterality: N/A;   CARDIOVERSION N/A 02/27/2022   Procedure: CARDIOVERSION;  Surgeon: Antonieta Iba, MD;  Location: [facility];  Service: Cardiovascular;  Laterality: N/A;   CATARACT EXTRACTION     ELECTROMAGNETIC NAVIGATION BROCHOSCOPY Right 04/07/2018   Procedure: ELECTROMAGNETIC NAVIGATION BRONCHOSCOPY;  Surgeon: Erin Fulling, MD;  Location: [facility];  Service: Cardiopulmonary;  Laterality: Right;   EYE SURGERY Right    Cataract Extraction with IOL   RETINAL LASER PROCEDURE     Social History:  reports that he quit smoking about 23 years ago. His smoking use included cigarettes. He started  smoking about 53 years ago. He has a 30 pack-year smoking history. He has never used smokeless tobacco. He reports that he does not drink alcohol and does not use drugs.  Allergies  Allergen Reactions   Codeine Nausea Only    Other reaction(s): makes him sick to his stomach Other reaction(s): makes him sick to his stomach    Family History  Problem Relation Age of Onset   Stroke Mother    Diabetes  Mellitus II Father    Heart Problems Father    Heart disease Father    Diabetes Sister    Cancer Maternal Grandfather    Stroke Paternal Grandmother    Prostate cancer Neg Hx    Bladder Cancer Neg Hx    Kidney cancer Neg Hx     Prior to Admission medications   Medication Sig Start Date End Date Taking? Authorizing Provider  acetaminophen (TYLENOL) 500 MG tablet Take 500 mg by mouth 2 (two) times daily as needed.   Yes [provider]  apixaban (ELIQUIS) 5 MG TABS tablet TAKE 1 TABLET BY MOUTH TWICE  DAILY 02/12/23  Yes Iran Ouch, MD  atorvastatin (LIPITOR) 40 MG tablet TAKE 1 TABLET BY MOUTH DAILY 11/28/22  Yes Berniece Salines, FNP  bimatoprost (LUMIGAN) 0.01 % SOLN Place 1 drop into both eyes at bedtime.    Yes [provider]  digoxin (LANOXIN) 0.125 MG tablet Take 0.5 tablets (0.0625 mg total) by mouth daily. 11/13/22  Yes Riddle, Luella Cook, NP  finasteride (PROSCAR) 5 MG tablet TAKE 1 TABLET BY MOUTH DAILY 12/02/21  Yes Stoioff, Verna Czech, MD  insulin detemir (LEVEMIR FLEXTOUCH) 100 UNIT/ML FlexPen Inject 30 Units into the skin every evening. 09/02/19  Yes [provider]  insulin lispro (HUMALOG) 100 UNIT/ML KwikPen Inject into the skin daily. Pt taking 4 units in AM, 12 units at noon 03/09/19  Yes [provider]  metFORMIN (GLUCOPHAGE) 1000 MG tablet Take 1,000 mg by mouth 2 (two) times daily with a meal. 04/02/21  Yes [provider]  metoprolol succinate (TOPROL-XL) 100 MG 24 hr tablet TAKE 1 TABLET BY MOUTH  DAILY . TAKE WITH OR IMMEDIATELY FOLLOWING A MEAL 11/13/22  Yes Sherie Don, NP  Multiple Vitamin (MULTI-VITAMINS) TABS Take 1 tablet by mouth 2 (two) times daily.    Yes [provider]  pioglitazone (ACTOS) 15 MG tablet Take 15 mg by mouth daily.   Yes [provider]  SIMBRINZA 1-0.2 % SUSP Place 1 drop into both eyes 3 (three) times daily.    Yes [provider]  tamsulosin (FLOMAX) 0.4 MG CAPS  capsule TAKE 1 CAPSULE BY MOUTH DAILY 03/27/23  Yes Berniece Salines, FNP  furosemide (LASIX) 20 MG tablet Take 1 tablet (20 mg total) by mouth as needed. Patient not taking: Reported on 04/26/2023 02/04/23   Sherie Don, NP    Physical Exam: Vitals:   04/26/23 1430 04/26/23 1445 04/26/23 1530 04/26/23 1545  BP:      Pulse: 87 (!) 117 (!) 104 (!) 132  Resp: 19 (!) 21 16 (!) 21  Temp:      TempSrc:      SpO2: 100% 100% 100% 95%  Weight:      Height:       Physical Exam Constitutional:      Appearance: He is normal weight.  HENT:     Head: Normocephalic and atraumatic.     Nose: Nose normal.     Mouth/Throat:  Mouth: Mucous membranes are dry.  Eyes:     Pupils: Pupils are equal, round, and reactive to light.  Cardiovascular:     Rate and Rhythm: Normal rate and regular rhythm.  Pulmonary:     Effort: Pulmonary effort is normal.  Abdominal:     General: Bowel sounds are normal.  Musculoskeletal:        General: Normal range of motion.  Skin:    General: Skin is dry.  Neurological:     General: No focal deficit present.  Psychiatric:        Mood and Affect: Mood normal.     Data Reviewed:  There are no new results to review at this time.  DG Chest Port 1 View CLINICAL DATA:  Questionable sepsis - evaluate for abnormality  EXAM: PORTABLE CHEST 1 VIEW  COMPARISON:  April 07, 2018  FINDINGS: The cardiomediastinal silhouette is unchanged in contour.Atherosclerotic calcifications no pleural effusion. No pneumothorax. No acute pleuroparenchymal abnormality.  IMPRESSION: No acute cardiopulmonary abnormality.  Electronically Signed   By: Meda Klinefelter M.D.   On: 04/26/2023 15:01  Lab Results  Component Value Date   WBC 13.3 (H) 04/26/2023   HGB 10.6 (L) 04/26/2023   HCT 32.0 (L) 04/26/2023   MCV 93.6 04/26/2023   PLT 333 04/26/2023   Last metabolic panel Lab Results  Component Value Date   GLUCOSE 353 (H) 04/26/2023   NA 132 (L) 04/26/2023    K 4.2 04/26/2023   CL 98 04/26/2023   CO2 24 04/26/2023   BUN 19 04/26/2023   CREATININE 1.36 (H) 04/26/2023   GFRNONAA 54 (L) 04/26/2023   CALCIUM 8.4 (L) 04/26/2023   PROT 6.7 04/26/2023   ALBUMIN 3.9 04/26/2023   LABGLOB 2.5 03/13/2017   AGRATIO 2.1 07/24/2016   BILITOT 0.9 04/26/2023   ALKPHOS 96 04/26/2023   AST 24 04/26/2023   ALT 22 04/26/2023   ANIONGAP 10 04/26/2023    Assessment and Plan: * SIRS (systemic inflammatory response syndrome) (HCC) Meeting SIRS criteria w/ T 100.7, HR 140s-Afib  WBC 13.3 Lactate 2.2-->2.0 No identifiable source of infection at present  CXR WNL  UA non indicative of infection  Covid, flu, RSV negative  Will place on empiric antibiotic coverage-deescalate as appropriate   Panculture  Monitor     Atrial fibrillation with RVR (HCC) Baseline atrial fibrillation on eliquis  Now w/ HR into 140s in setting of SIRS, hyperglycemia  S/p IV diltiazem x 10mg  x 1 IV dilt gtt 2D ECHO  Monitor  Consult cardiology as appropriate    Chronic kidney disease, stage 3b (HCC) Cr 1.36 w/ GFR in the 50s At baseline  Monitor    Encephalopathy Mild generalized confusion on presentation  + baseline chronic confusion in discussion w/ son Qusai Kem  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  Will check CT head x 1 Suspect subacute decompensation in setting of SIRS and hyperglycemia  IVF hydration  SIRS/infectious workup  Check ammonia level  Follow closely  Benign prostatic hyperplasia with nocturia Cont flomax    Type 2 diabetes mellitus with hyperglycemia, with long-term current use of insulin (HCC) Blood sugars predominantly in 300s without overt DKA/HHS  SSI  A1C  Monitor   Acid reflux PPI  Hyperlipidemia Cont statin    Essential hypertension, benign BP stable  Titrate home regimen     Greater than 50% was spent in counseling and coordination of care with patient Critical care time: 60+ minutes    Advance Care Planning:    Code  Status: Full Code   Consults: Cardiology   Family Communication: No family at the bedside. Case discussed w/ son over the phone   Severity of Illness: The appropriate patient status for this patient is INPATIENT. Inpatient status is judged to be reasonable and necessary in order to provide the required intensity of service to ensure the patient's safety. The patient's presenting symptoms, physical exam findings, and initial radiographic and laboratory data in the context of their chronic comorbidities is felt to place them at high risk for further clinical deterioration. Furthermore, it is not anticipated that the patient will be medically stable for discharge from the hospital within 2 midnights of admission.   * I certify that at the point of admission it is my clinical judgment that the patient will require inpatient hospital care spanning beyond 2 midnights from the point of admission due to high intensity of service, high risk for further deterioration and high frequency of surveillance required.*  Author: Floydene Flock, MD 04/26/2023 6:18 PM  For on call review [email or web address].

## 2023-04-26 NOTE — Sepsis Progress Note (Signed)
 Sepsis protocol is being followed by [facility].

## 2023-04-26 NOTE — Assessment & Plan Note (Signed)
 Cont statin

## 2023-04-26 NOTE — Assessment & Plan Note (Addendum)
 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

## 2023-04-27 ENCOUNTER — Inpatient Hospital Stay (HOSPITAL_COMMUNITY)
Admit: 2023-04-27 | Discharge: 2023-04-27 | Disposition: A | Discharge status: Home / Self Care | Attending: Family Medicine | Admitting: Family Medicine

## 2023-04-27 DIAGNOSIS — I4891 Unspecified atrial fibrillation: Secondary | ICD-10-CM

## 2023-04-27 DIAGNOSIS — R6510 Systemic inflammatory response syndrome (SIRS) of non-infectious origin without acute organ dysfunction: Secondary | ICD-10-CM | POA: Diagnosis not present

## 2023-04-27 LAB — ECHOCARDIOGRAM COMPLETE
AR max vel: 2.19 cm2
AV Area VTI: 1.96 cm2
AV Area mean vel: 2.12 cm2
AV Mean grad: 2 mmHg
AV Peak grad: 4.5 mmHg
Ao pk vel: 1.06 m/s
Area-P 1/2: 4.33 cm2
Height: 68 in
S' Lateral: 2.4 cm
Weight: 2080 [oz_av]

## 2023-04-27 LAB — RESPIRATORY PANEL BY PCR

## 2023-04-27 LAB — COMPREHENSIVE METABOLIC PANEL
ALT: 23 U/L (ref 0–44)
AST: 27 U/L (ref 15–41)
Albumin: 3.6 g/dL (ref 3.5–5.0)
Alkaline Phosphatase: 87 U/L (ref 38–126)
Anion gap: 10 (ref 5–15)
BUN: 21 mg/dL (ref 8–23)
CO2: 24 mmol/L (ref 22–32)
Calcium: 8.4 mg/dL — ABNORMAL LOW (ref 8.9–10.3)
Chloride: 102 mmol/L (ref 98–111)
Creatinine, Ser: 1.28 mg/dL — ABNORMAL HIGH (ref 0.61–1.24)
GFR, Estimated: 58 mL/min — ABNORMAL LOW (ref >60)
Glucose, Bld: 156 mg/dL — ABNORMAL HIGH (ref 70–99)
Potassium: 3.7 mmol/L (ref 3.5–5.1)
Sodium: 136 mmol/L (ref 135–145)
Total Bilirubin: 0.7 mg/dL (ref 0.0–1.2)
Total Protein: 6.3 g/dL — ABNORMAL LOW (ref 6.5–8.1)

## 2023-04-27 LAB — CBG MONITORING, ED
Glucose-Capillary: 115 mg/dL — ABNORMAL HIGH (ref 70–99)
Glucose-Capillary: 154 mg/dL — ABNORMAL HIGH (ref 70–99)
Glucose-Capillary: 82 mg/dL (ref 70–99)
Glucose-Capillary: 218 mg/dL — ABNORMAL HIGH (ref 70–99)
Glucose-Capillary: 109 mg/dL — ABNORMAL HIGH (ref 70–99)

## 2023-04-27 LAB — CBC
HCT: 29.4 % — ABNORMAL LOW (ref 39.0–52.0)
Hemoglobin: 10 g/dL — ABNORMAL LOW (ref 13.0–17.0)
MCH: 31.5 pg (ref 26.0–34.0)
MCHC: 34 g/dL (ref 30.0–36.0)
MCV: 92.7 fL (ref 80.0–100.0)
Platelets: 273 10*3/uL (ref 150–400)
RBC: 3.17 MIL/uL — ABNORMAL LOW (ref 4.22–5.81)
RDW: 13.6 % (ref 11.5–15.5)
WBC: 10.4 10*3/uL (ref 4.0–10.5)
nRBC: 0 % (ref 0.0–0.2)

## 2023-04-27 MED ORDER — DIGOXIN 125 MCG PO TABS
0.0625 mg | ORAL_TABLET | Freq: Every day | ORAL | Status: DC
  Administered 2023-04-27 – 2023-04-28 (×2): 0.0625 mg via ORAL
  Filled 2023-04-27 (×2): qty 0.5

## 2023-04-27 MED ORDER — TAMSULOSIN HCL 0.4 MG PO CAPS
0.4000 mg | ORAL_CAPSULE | Freq: Every evening | ORAL | Status: DC
  Administered 2023-04-27: 0.4 mg via ORAL
  Filled 2023-04-27: qty 1

## 2023-04-27 MED ORDER — METOPROLOL SUCCINATE ER 50 MG PO TB24
100.0000 mg | ORAL_TABLET | Freq: Every day | ORAL | Status: DC
  Administered 2023-04-27 – 2023-04-28 (×2): 100 mg via ORAL
  Filled 2023-04-27 (×2): qty 2

## 2023-04-27 MED ORDER — ATORVASTATIN CALCIUM 20 MG PO TABS
40.0000 mg | ORAL_TABLET | Freq: Every day | ORAL | Status: DC
  Administered 2023-04-27 – 2023-04-28 (×2): 40 mg via ORAL
  Filled 2023-04-27 (×2): qty 2

## 2023-04-27 MED ORDER — BRIMONIDINE TARTRATE 0.2 % OP SOLN
1.0000 [drp] | Freq: Three times a day (TID) | OPHTHALMIC | Status: DC
  Administered 2023-04-27 – 2023-04-28 (×3): 1 [drp] via OPHTHALMIC
  Filled 2023-04-27: qty 5

## 2023-04-27 MED ORDER — LATANOPROST 0.005 % OP SOLN
1.0000 [drp] | Freq: Every day | OPHTHALMIC | Status: DC
  Administered 2023-04-27: 1 [drp] via OPHTHALMIC
  Filled 2023-04-27: qty 2.5

## 2023-04-27 MED ORDER — BRINZOLAMIDE 1 % OP SUSP
1.0000 [drp] | Freq: Three times a day (TID) | OPHTHALMIC | Status: DC
  Administered 2023-04-27 – 2023-04-28 (×3): 1 [drp] via OPHTHALMIC
  Filled 2023-04-27: qty 10

## 2023-04-27 MED ORDER — FINASTERIDE 5 MG PO TABS
5.0000 mg | ORAL_TABLET | Freq: Every day | ORAL | Status: DC
  Administered 2023-04-27 – 2023-04-28 (×2): 5 mg via ORAL
  Filled 2023-04-27 (×2): qty 1

## 2023-04-27 NOTE — Evaluation (Signed)
 Occupational Therapy Evaluation Patient Details Name: Garrett Yoder. MRN: [id number] DOB: 03/07/1945 Today's Date: 04/27/2023   History of Present Illness   Pt is a 78 y/o M admitted on 04/26/23 after presenting with hyperglycemia. Pt is being treated for SIRS, a-fib with RVR, hyperglycemia, encephalopathy. Pt tested positive for rhinovirus/enterovirus. PMH: a-fib, DM2, HTN, HLD, anxiety, cervical DDD     Clinical Impressions PTA, pt reports living alone with his dog and is able to complete ADLs/IADLs independently. Able to complete bed mobility mod independent, dons shoes seated EOB without LOB reaching down, performs STS transfers with supervision with OT managing IV pole + lines/leads. Functional mobility no AD 30 ft in room, no overt LOB during direction changes with pt endorsing mild fatigue. Declines further ADL performance at this time. VSS on RA. Pt would benefit from skilled OT services to address noted impairments and functional limitations (see below for any additional details) in order to maximize safety and independence while minimizing falls risk and caregiver burden. Anticipate the need for follow up [facility] OT services upon acute hospital DC.      If plan is discharge home, recommend the following:   A little help with walking and/or transfers;A little help with bathing/dressing/bathroom;Assist for transportation     Functional Status Assessment   Patient has had a recent decline in their functional status and demonstrates the ability to make significant improvements in function in a reasonable and predictable amount of time.     Equipment Recommendations   None recommended by OT     Recommendations for Other Services         Precautions/Restrictions   Precautions Precautions: Fall Restrictions Weight Bearing Restrictions Per Provider Order: No     Mobility Bed Mobility Overal bed mobility: Modified Independent                   Transfers Overall transfer level: Needs assistance Equipment used: None Transfers: Sit to/from Stand Sit to Stand: Supervision                  Balance Overall balance assessment: Needs assistance Sitting-balance support: Feet supported Sitting balance-Leahy Scale: Good     Standing balance support: During functional activity Standing balance-Leahy Scale: Good                             ADL either performed or assessed with clinical judgement   ADL Overall ADL's : Needs assistance/impaired Eating/Feeding: Independent   Grooming: Independent                   Toilet Transfer: Radiographer, therapeutic Details (indicate cue type and reason): clinical judgement Toileting- Clothing Manipulation and Hygiene: Supervision/safety;Sit to/from stand       Functional mobility during ADLs: Supervision/safety General ADL Comments: Pt declines full ADL performance, anticipate pt close to baseline at supervision level     Vision Baseline Vision/History: 1 Wears glasses Ability to See in Adequate Light: 0 Adequate Patient Visual Report: No change from baseline              Pertinent Vitals/Pain Pain Assessment Pain Assessment: No/denies pain     Extremity/Trunk Assessment Upper Extremity Assessment Upper Extremity Assessment: Overall WFL for tasks assessed   Lower Extremity Assessment Lower Extremity Assessment: Overall WFL for tasks assessed       Communication Communication Communication: No apparent difficulties Factors Affecting Communication: Hearing impaired   Cognition Arousal: Alert Behavior  During Therapy: WFL for tasks assessed/performed                                 Following commands: Intact       Cueing  General Comments   Cueing Techniques: Verbal cues  BP 116/46 (68) HR 80s O2 on RA 97%           Home Living Family/patient expects to be discharged to:: Private residence Living  Arrangements: Alone Available Help at Discharge: Family Type of Home: House Home Access: Stairs to enter Secretary/administrator of Steps: 2 Entrance Stairs-Rails: None Home Layout: One level     Bathroom Shower/Tub: Tub/shower unit;Walk-in shower   Bathroom Toilet: Standard Bathroom Accessibility: Yes   Home Equipment: Rollator (4 wheels)          Prior Functioning/Environment Prior Level of Function : Independent/Modified Independent;Driving             Mobility Comments: denies falls, ambulatory without AD ADLs Comments: independent with bathing, dressing, cooking, cleaning    OT Problem List: Decreased activity tolerance;Cardiopulmonary status limiting activity;Decreased knowledge of use of DME or AE   OT Treatment/Interventions: Self-care/ADL training;Therapeutic exercise;Energy conservation;DME and/or AE instruction;Therapeutic activities;Patient/family education;Balance training      OT Goals(Current goals can be found in the care plan section)   Acute Rehab OT Goals OT Goal Formulation: With patient Time For Goal Achievement: 05/11/23 Potential to Achieve Goals: Good   OT Frequency:  Min 2X/week       AM-PAC OT "6 Clicks" Daily Activity     Outcome Measure Help from another person eating meals?: None Help from another person taking care of personal grooming?: None Help from another person toileting, which includes using toliet, bedpan, or urinal?: A Little Help from another person bathing (including washing, rinsing, drying)?: A Little Help from another person to put on and taking off regular upper body clothing?: None Help from another person to put on and taking off regular lower body clothing?: A Little 6 Click Score: 21   End of Session    Activity Tolerance: Patient tolerated treatment well Patient left: in bed;with call bell/phone within reach  OT Visit Diagnosis: Other abnormalities of gait and mobility (R26.89)                Time:  1610-9604 OT Time Calculation (min): 19 min Charges:  OT General Charges $OT Visit: 1 Visit OT Evaluation $OT Eval Low Complexity: 1 Low  Corley Maffeo L. Quenesha Douglass, OTR/L  04/27/23, 12:28 PM

## 2023-04-27 NOTE — Consult Note (Signed)
 Cardiology Consultation   Patient ID: Garrett Yoder. MRN: [id number]; DOB: 02-Apr-1945  Admit date: 04/26/2023 Date of Consult: 04/27/2023  PCP:  Berniece Salines, FNP   [facility] Providers Cardiologist:  Lorine Bears, MD  Electrophysiologist:  Lanier Prude, MD  {  Patient Profile:   Garrett Yoder. is a 78 y.o. male with a hx of permanent atrial fibrillation, coronary artery calcifications, aortic atherosclerosis, hyperlipidemia, CKD stage III, diabetes type 2, hypertension who is being seen 04/27/2023 for the evaluation of atrial fibrillation at the request of Dr. Lyn Hollingshead.  History of Present Illness:   Garrett Yoder established with Dr. Arnett Sink in December 2018 following imaging which showed aortic atherosclerosis.  Stress testing at that time was low risk without ischemia or infarct within normal LVEF.    Has a history of atrial fibrillation diagnosed in November 2023.  Echo December 2023 showed EF of 50 to 55%, no wall motion abnormality, normal RV SF, mildly dilated right atrium, mild MR, moderate TR.  He underwent cardioversion in February 27, 2022 with return of A-fib on January 16.,  He saw Dr. Lalla Brothers in February 2024 and was asymptomatic from an A-fib perspective.  Rate control was agreed upon.  He is on Toprol and digoxin.  He takes Eliquis for stroke prophylaxis.  The patient was last seen February 04, 2023 with A-fib rates 80-100.  He was started on Lasix for lower leg edema.  Patient presented to the ER 04/26/2023 with hyperglycemia.  He reported blood sugars in the 300s. He also reported low blood sugars in the 50 range.  He was feeling lightheaded. He denies chest pain, SOB. He reported he was eating and drinking normally. He lives by himself, can perform ADLs. Denies recent fever or chills.   In the ER blood pressure 123/72, pulse 126 bpm, respiratory rate 20, 100.7 Fahrenheit.  Labs showed blood glucose 353.  WBC 13.3, hemoglobin 10.6, platelets 333,  COVID flu and RSV negative.  Lactate 2.2, 2.  Serum creatinine 1.36.  UA negative.  Chest x-ray unremarkable.  CT head unremarkable.  EKG showed A-fib with a heart rate of 124 bpm.  Patient was given IV Rocephin, sodium chloride bolus   Past Medical History:  Diagnosis Date   Anemia 08/14/2015   Anxiety    Aortic atherosclerosis (HCC) 11/25/2016   Chest CT Oct 2018   Aorto-iliac atherosclerosis (HCC) 11/25/2016   Abd/pelvic CT Oct 2018   Coronary artery calcification seen on CAT scan 11/25/2016   Chest CT Oct 2018; refer to Dr. Kirke Corin   DDD (degenerative disc disease), cervical 08/21/2016   Depression    Diabetes mellitus without complication (HCC)    Facet arthropathy, cervical 08/21/2016   GERD (gastroesophageal reflux disease)    Heart murmur    As child   History of echocardiogram    a. 01/2022 Echo: EF 50-55%, no rwma, nl RV fxn w/ RVSP 34.73mmHg, Mildly dil RA. Mild MR. Mod TR. AoV sclerosis w/o stenosis.   History of stress test    a. 01/2017 Ex MV: Poor ex capacity - held in stage I. 1mm Inflat ST depression. No ischemia/infarct. EF 55-65%.   Hyperlipidemia    Hypertension    Hypertrophy of prostate with urinary obstruction 11/25/2016   Pelvic CT October 2018   Incidental lung nodule, > 3mm and < 8mm 11/25/2016   5 mm nodule LLL, chest CT Oct 2018   Persistent atrial fibrillation (HCC)    a. 02/2022 Dx  in 01/2022-->CHA2DS2VASc = 5-->Eliquis; b. 02/2022 s/p DCCV (150J->200J->RSR).   Thrombocytosis 08/14/2015    Past Surgical History:  Procedure Laterality Date   ANTERIOR CERVICAL DECOMP/DISCECTOMY FUSION N/A 09/24/2016   Procedure: ANTERIOR CERVICAL DECOMPRESSION/DISCECTOMY FUSION 3 LEVELS-C4-7;  Surgeon: Venetia Night, MD;  Location: [facility] ORS;  Service: Neurosurgery;  Laterality: N/A;   CARDIOVERSION N/A 02/27/2022   Procedure: CARDIOVERSION;  Surgeon: Antonieta Iba, MD;  Location: [facility] ORS;  Service: Cardiovascular;  Laterality: N/A;   CATARACT EXTRACTION      ELECTROMAGNETIC NAVIGATION BROCHOSCOPY Right 04/07/2018   Procedure: ELECTROMAGNETIC NAVIGATION BRONCHOSCOPY;  Surgeon: Erin Fulling, MD;  Location: [facility] ORS;  Service: Cardiopulmonary;  Laterality: Right;   EYE SURGERY Right    Cataract Extraction with IOL   RETINAL LASER PROCEDURE       Home Medications:  Prior to Admission medications   Medication Sig Start Date End Date Taking? Authorizing Provider  acetaminophen (TYLENOL) 500 MG tablet Take 500 mg by mouth 2 (two) times daily as needed.   Yes [provider]  apixaban (ELIQUIS) 5 MG TABS tablet TAKE 1 TABLET BY MOUTH TWICE  DAILY 02/12/23  Yes Iran Ouch, MD  atorvastatin (LIPITOR) 40 MG tablet TAKE 1 TABLET BY MOUTH DAILY 11/28/22  Yes Berniece Salines, FNP  bimatoprost (LUMIGAN) 0.01 % SOLN Place 1 drop into both eyes at bedtime.    Yes [provider]  digoxin (LANOXIN) 0.125 MG tablet Take 0.5 tablets (0.0625 mg total) by mouth daily. 11/13/22  Yes Riddle, Luella Cook, NP  finasteride (PROSCAR) 5 MG tablet TAKE 1 TABLET BY MOUTH DAILY 12/02/21  Yes Stoioff, Verna Czech, MD  insulin detemir (LEVEMIR FLEXTOUCH) 100 UNIT/ML FlexPen Inject 30 Units into the skin every evening. 09/02/19  Yes [provider]  insulin lispro (HUMALOG) 100 UNIT/ML KwikPen Inject into the skin daily. Pt taking 4 units in AM, 12 units at noon 03/09/19  Yes [provider]  metFORMIN (GLUCOPHAGE) 1000 MG tablet Take 1,000 mg by mouth 2 (two) times daily with a meal. 04/02/21  Yes [provider]  metoprolol succinate (TOPROL-XL) 100 MG 24 hr tablet TAKE 1 TABLET BY MOUTH  DAILY . TAKE WITH OR IMMEDIATELY FOLLOWING A MEAL 11/13/22  Yes Sherie Don, NP  Multiple Vitamin (MULTI-VITAMINS) TABS Take 1 tablet by mouth 2 (two) times daily.    Yes [provider]  pioglitazone (ACTOS) 15 MG tablet Take 15 mg by mouth daily.   Yes [provider]  SIMBRINZA 1-0.2 % SUSP Place 1 drop into both eyes 3 (three) times  daily.    Yes [provider]  tamsulosin (FLOMAX) 0.4 MG CAPS capsule TAKE 1 CAPSULE BY MOUTH DAILY 03/27/23  Yes Berniece Salines, FNP  furosemide (LASIX) 20 MG tablet Take 1 tablet (20 mg total) by mouth as needed. Patient not taking: Reported on 04/26/2023 02/04/23   Sherie Don, NP    Inpatient Medications: Scheduled Meds:  apixaban  5 mg Oral BID   insulin aspart  0-9 Units Subcutaneous Q4H   insulin glargine  10 Units Subcutaneous QHS   Continuous Infusions:  ceFEPime (MAXIPIME) IV Stopped (04/26/23 2143)   diltiazem (CARDIZEM) infusion 5 mg/hr (04/26/23 1732)   lactated ringers Stopped (04/26/23 1807)   lactated ringers Stopped (04/27/23 0030)   vancomycin     PRN Meds: ondansetron **OR** ondansetron (ZOFRAN) IV  Allergies:    Allergies  Allergen Reactions   Codeine Nausea Only    Other reaction(s): makes him sick to his stomach  Other reaction(s): makes him sick to his stomach    Social History:   Social History   Socioeconomic History   Marital status: Widowed    Spouse name: [name]   Number of children: Not on file   Years of education: Not on file   Highest education level: Not on file  Occupational History   Occupation: retired  Tobacco Use   Smoking status: Former    Current packs/day: 0.00    Average packs/day: 1 pack/day for 30.0 years (30.0 ttl pk-yrs)    Types: Cigarettes    Start date: 07/18/1969    Quit date: 07/19/1999    Years since quitting: 23.7   Smokeless tobacco: Never  Vaping Use   Vaping status: Never Used  Substance and Sexual Activity   Alcohol use: No    Alcohol/week: 0.0 standard drinks of alcohol   Drug use: No   Sexual activity: Yes    Partners: Female  Other Topics Concern   Not on file  Social History Narrative   Not on file   Social Drivers of Health   Financial Resource Strain: Low Risk  (10/10/2022)   Overall Financial Resource Strain (CARDIA)    Difficulty of Paying Living Expenses: Not hard at all  Food  Insecurity: No Food Insecurity (10/10/2022)   Hunger Vital Sign    Worried About Running Out of Food in the Last Year: Never true    Ran Out of Food in the Last Year: Never true  Transportation Needs: No Transportation Needs (10/10/2022)   PRAPARE - Administrator, Civil Service (Medical): No    Lack of Transportation (Non-Medical): No  Physical Activity: Sufficiently Active (10/10/2022)   Exercise Vital Sign    Days of Exercise per Week: 5 days    Minutes of Exercise per Session: 60 min  Stress: No Stress Concern Present (10/10/2022)   [employer] of Occupational Health - Occupational Stress Questionnaire    Feeling of Stress : Not at all  Social Connections: Moderately Isolated (10/10/2022)   Social Connection and Isolation Panel [NHANES]    Frequency of Communication with Friends and Family: Twice a week    Frequency of Social Gatherings with Friends and Family: Twice a week    Attends Religious Services: More than 4 times per year    Active Member of [employer] or Organizations: No    Attends Banker Meetings: Never    Marital Status: Widowed  Intimate Partner Violence: Not At Risk (10/10/2022)   Humiliation, Afraid, Rape, and Kick questionnaire    Fear of Current or Ex-Partner: No    Emotionally Abused: No    Physically Abused: No    Sexually Abused: No    Family History:    Family History  Problem Relation Age of Onset   Stroke Mother    Diabetes Mellitus II Father    Heart Problems Father    Heart disease Father    Diabetes Sister    Cancer Maternal Grandfather    Stroke Paternal Grandmother    Prostate cancer Neg Hx    Bladder Cancer Neg Hx    Kidney cancer Neg Hx      ROS:  Please see the history of present illness.   All other ROS reviewed and negative.     Physical Exam/Data:   Vitals:   04/27/23 0300 04/27/23 0315 04/27/23 0400 04/27/23 0600  BP: (!) 130/57  (!) 115/58 112/62  Pulse: 80 86 85 97  Resp: 17 16 17  (!)  21  Temp:     98.9 F (37.2 C)  TempSrc:    Oral  SpO2: 97% 97% 100% 95%  Weight:      Height:        Intake/Output Summary (Last 24 hours) at 04/27/2023 0729 Last data filed at 04/27/2023 0030 Gross per 24 hour  Intake 1155.92 ml  Output --  Net 1155.92 ml      04/26/2023    2:20 PM 02/04/2023   10:31 AM 01/20/2023    3:11 PM  Last 3 Weights  Weight (lbs) 130 lb 138 lb 9.6 oz 139 lb  Weight (kg) 58.968 kg 62.869 kg 63.05 kg     Body mass index is 19.77 kg/m.  General:  Well nourished, well developed, in no acute distress HEENT: normal Neck: no JVD Vascular: No carotid bruits; Distal pulses 2+ bilaterally Cardiac:  normal S1, S2; Irreg Irreg; no murmur  Lungs:  clear to auscultation bilaterally, no wheezing, rhonchi or rales  Abd: soft, nontender, no hepatomegaly  Ext: no edema Musculoskeletal:  No deformities, BUE and BLE strength normal and equal Skin: warm and dry  Neuro:  CNs 2-12 intact, no focal abnormalities noted Psych:  Normal affect   EKG:  The EKG was personally reviewed and demonstrates:  Afib 124bpm, Pvcs, min ST depression inf leads Telemetry:  Telemetry was personally reviewed and demonstrates:  afib Hr 160s>>>80sPVCs  Relevant CV Studies:  Echo 2023 1. Left ventricular ejection fraction, by estimation, is 50 to 55%. The  left ventricle has low normal function. The left ventricle has no regional  wall motion abnormalities. Left ventricular diastolic parameters are  indeterminate.   2. Right ventricular systolic function is normal. The right ventricular  size is normal. There is normal pulmonary artery systolic pressure. The  estimated right ventricular systolic pressure is 34.2 mmHg.   3. Right atrial size was mildly dilated.   4. The mitral valve is normal in structure. Mild mitral valve  regurgitation. No evidence of mitral stenosis.   5. Tricuspid valve regurgitation is moderate.   6. The aortic valve is normal in structure. Aortic valve regurgitation is  not  visualized. Aortic valve sclerosis/calcification is present, without  any evidence of aortic stenosis.   7. The inferior vena cava is normal in size with greater than 50%  respiratory variability, suggesting right atrial pressure of 3 mmHg.   8. Rhythm is atrial fibrillation rate 90 to 130 bpm   Myoview lexiscan 2018 Narrative & Impression  Blood pressure demonstrated a hypertensive response to exercise. Horizontal ST segment depression ST segment depression of 1 mm was noted during stress in the II, III, aVF, V5 and V6 leads. The study is normal. This is a low risk study. The left ventricular ejection fraction is normal (55-65%). Poor exercise capacity overall. He was held in stage I.    Laboratory Data:  High Sensitivity Troponin:  No results for input(s): "TROPONINIHS" in the last 720 hours.   Chemistry Recent Labs  Lab 04/26/23 1500 04/27/23 0542  NA 132* 136  K 4.2 3.7  CL 98 102  CO2 24 24  GLUCOSE 353* 156*  BUN 19 21  CREATININE 1.36* 1.28*  CALCIUM 8.4* 8.4*  GFRNONAA 54* 58*  ANIONGAP 10 10    Recent Labs  Lab 04/26/23 1500 04/27/23 0542  PROT 6.7 6.3*  ALBUMIN 3.9 3.6  AST 24 27  ALT 22 23  ALKPHOS 96 87  BILITOT 0.9 0.7   Lipids No results for input(s): "  CHOL", "TRIG", "HDL", "LABVLDL", "LDLCALC", "CHOLHDL" in the last 168 hours.  Hematology Recent Labs  Lab 04/26/23 1520 04/27/23 0542  WBC 13.3* 10.4  RBC 3.42* 3.17*  HGB 10.6* 10.0*  HCT 32.0* 29.4*  MCV 93.6 92.7  MCH 31.0 31.5  MCHC 33.1 34.0  RDW 13.5 13.6  PLT 333 273   Thyroid No results for input(s): "TSH", "FREET4" in the last 168 hours.  BNPNo results for input(s): "BNP", "PROBNP" in the last 168 hours.  DDimer No results for input(s): "DDIMER" in the last 168 hours.   Radiology/Studies:  CT HEAD WO CONTRAST ( ) Result Date: 04/26/2023 CLINICAL DATA:  Mental status change, unknown cause EXAM: CT HEAD WITHOUT CONTRAST TECHNIQUE: Contiguous axial images were obtained from the  base of the skull through the vertex without intravenous contrast. RADIATION DOSE REDUCTION: This exam was performed according to the departmental dose-optimization program which includes automated exposure control, adjustment of the mA and/or kV according to patient size and/or use of iterative reconstruction technique. COMPARISON:  Head CT 08/28/2016 FINDINGS: Brain: Generalized atrophy with mild progression from 2018. no intracranial hemorrhage, mass effect, or midline shift. No hydrocephalus. The basilar cisterns are patent. There is mild periventricular and deep white matter hypodensity typical of chronic small vessel ischemia. No evidence of territorial infarct or acute ischemia. No extra-axial or intracranial fluid collection. Vascular: Atherosclerosis of skullbase vasculature without hyperdense vessel or abnormal calcification. Skull: No fracture or focal lesion. Sinuses/Orbits: Mucosal thickening involving right ethmoid air cells. No fluid levels. No mastoid effusion. Other: None. IMPRESSION: 1. No acute intracranial abnormality. 2. Generalized atrophy and chronic small vessel ischemia. Electronically Signed   By: Narda Rutherford M.D.   On: 04/26/2023 18:47   DG Chest Port 1 View Result Date: 04/26/2023 CLINICAL DATA:  Questionable sepsis - evaluate for abnormality EXAM: PORTABLE CHEST 1 VIEW COMPARISON:  April 07, 2018 FINDINGS: The cardiomediastinal silhouette is unchanged in contour.Atherosclerotic calcifications no pleural effusion. No pneumothorax. No acute pleuroparenchymal abnormality. IMPRESSION: No acute cardiopulmonary abnormality. Electronically Signed   By: Meda Klinefelter M.D.   On: 04/26/2023 15:01     Assessment and Plan:   SIRS -Mild fever on presentation with heart rate 140s, WBC 13.3 and lactate 2.2>2.0 - IVF - Respiratory panel + Rhinovirus - BC pending -Antibiotics per IM  Encephalopathy -Confusion on presentation, but may have baseline dementia per son report -CT  head unremarkable -In the setting of hyperglycemia and SIRS - IVF  A-fib with RVR History of permanent A-fib -he is followed by EP as outpatient - On presentation heart rate in the 140s started on IV Dilt -Heart rates improved to the 80s - suspect exacerbated rates in the setting of metabolic derangements and rhino virus infection -PTA Toprol  100mg  daily and digoxin 0.125mg  daily - currently on low dose IV dilt. Can wean off Dilt and restart home meds - echo has been ordered - continue Eliquis for stroke ppx  CKD stage III -Serum creatinine 1.36 on presentation, at baseline  Type 2 diabetes with hyperglycemia -Blood sugars in the 300s - A1C 7.8 -Per IM  Elevated BNP - BNP 184 - echo as above - PTA torsemide as needed - does not appear volume up on exam   For questions or updates, please contact [facility] Please consult [email or web address] for contact info under    Signed, Tyree Fluharty David Stall, PA-C  04/27/2023 7:29 AM

## 2023-04-27 NOTE — Evaluation (Signed)
 Physical Therapy Evaluation Patient Details Name: Garrett Yoder. MRN: [id number] DOB: [birth year] Today's Date: 04/27/2023  History of Present Illness  Pt is a 78 y/o M admitted on 04/26/23 after presenting with hyperglycemia. Pt is being treated for SIRS, a-fib with RVR, hyperglycemia, encephalopathy. Pt tested positive for rhinovirus/enterovirus. PMH: a-fib, DM2, HTN, HLD, anxiety, cervical DDD  Clinical Impression  Pt seen for PT evaluation with pt agreeable to tx, family present. Pt reports prior to admission he was independent without AD, denies falls. On this date, pt is able to complete bed mobility & STS without AD with mod I, ambulate short distance without AD with supervision fade to mod I, PT managing lines/leads. Gait distance limited by stationary IV pole & pt unable to be disconnected at this time. Will attempt to see pt again tomorrow for long distance gait & stair negotiation to ensure pt does not need acute PT or f/u services.         If plan is discharge home, recommend the following:     Can travel by private vehicle        Equipment Recommendations None recommended by PT  Recommendations for Other Services       Functional Status Assessment Patient has had a recent decline in their functional status and demonstrates the ability to make significant improvements in function in a reasonable and predictable amount of time.     Precautions / Restrictions Precautions Precautions: Fall Restrictions Weight Bearing Restrictions Per Provider Order: No      Mobility  Bed Mobility Overal bed mobility: Modified Independent             General bed mobility comments: supine<>sit    Transfers Overall transfer level: Needs assistance Equipment used: None Transfers: Sit to/from Stand Sit to Stand: Modified independent (Device/Increase time)                Ambulation/Gait Ambulation/Gait assistance: Supervision, Modified independent (Device/Increase  time) Gait Distance (Feet): 10 Feet Assistive device: None Gait Pattern/deviations: Step-through pattern Gait velocity: decreased     General Gait Details: distance limited by IV  Stairs            Wheelchair Mobility     Tilt Bed    Modified Rankin (Stroke Patients Only)       Balance Overall balance assessment: Needs assistance Sitting-balance support: Feet supported Sitting balance-Leahy Scale: Good     Standing balance support: During functional activity, No upper extremity supported Standing balance-Leahy Scale: Good                               Pertinent Vitals/Pain Pain Assessment Pain Assessment: No/denies pain    Home Living Family/patient expects to be discharged to:: Private residence Living Arrangements: Alone Available Help at Discharge: Family Type of Home: House Home Access: Stairs to enter Entrance Stairs-Rails: None Secretary/administrator of Steps: 2   Home Layout: One level Home Equipment: Rollator (4 wheels)      Prior Function Prior Level of Function : Independent/Modified Independent;Driving             Mobility Comments: denies falls, ambulatory without AD ADLs Comments: independent with bathing, dressing, cooking, cleaning     Extremity/Trunk Assessment   Upper Extremity Assessment Upper Extremity Assessment: Overall WFL for tasks assessed    Lower Extremity Assessment Lower Extremity Assessment: Overall WFL for tasks assessed       Communication   Communication  Communication: No apparent difficulties Factors Affecting Communication: Hearing impaired    Cognition Arousal: Alert Behavior During Therapy: WFL for tasks assessed/performed   PT - Cognitive impairments: No apparent impairments                         Following commands: Intact       Cueing Cueing Techniques: Verbal cues     General Comments General comments (skin integrity, edema, etc.): HR 82-100 bpm    Exercises      Assessment/Plan    PT Assessment Patient needs continued PT services  PT Problem List Decreased mobility;Decreased activity tolerance       PT Treatment Interventions DME instruction;Balance training;Gait training;Neuromuscular re-education;Functional mobility training;Stair training;Therapeutic activities;Therapeutic exercise;Patient/family education    PT Goals (Current goals can be found in the Care Plan section)  Acute Rehab PT Goals Patient Stated Goal: go home PT Goal Formulation: With patient Time For Goal Achievement: 05/11/23 Potential to Achieve Goals: Good    Frequency Min 1X/week     Co-evaluation               AM-PAC PT "6 Clicks" Mobility  Outcome Measure Help needed turning from your back to your side while in a flat bed without using bedrails?: None Help needed moving from lying on your back to sitting on the side of a flat bed without using bedrails?: None Help needed moving to and from a bed to a chair (including a wheelchair)?: None Help needed standing up from a chair using your arms (e.g., wheelchair or bedside chair)?: None Help needed to walk in hospital room?: None Help needed climbing 3-5 steps with a railing? : A Little 6 Click Score: 23    End of Session   Activity Tolerance: Patient tolerated treatment well Patient left: in bed;with family/visitor present (in hallway by nurses station)   PT Visit Diagnosis: Unsteadiness on feet (R26.81);Muscle weakness (generalized) (M62.81)    Time: 0865-7846 PT Time Calculation (min) (ACUTE ONLY): 9 min   Charges:   PT Evaluation $PT Eval Low Complexity: 1 Low   PT General Charges $$ ACUTE PT VISIT: 1 Visit         Aleda Grana, PT, DPT 04/27/23, 11:43 AM   Sandi Mariscal 04/27/2023, 11:42 AM

## 2023-04-27 NOTE — Progress Notes (Signed)
*  PRELIMINARY RESULTS* Echocardiogram 2D Echocardiogram has been performed.  Cristela Blue 04/27/2023, 1:40 PM

## 2023-04-27 NOTE — Hospital Course (Addendum)
 Hospital course / significant events:   Garrett Yoder. is a 78 y.o. male with medical history significant of atrial fibrillation, type 2 diabetes, hypertension, hyperlipidemia presenting with SIRS, A-fib with RVR, hyperglycemia, encephalopathy. Admited to hosptialist for Afib RVR on dilt drip, (+)rhinovirus. Off dilt in afternoon 04/27/23, cardiology consults, ok to resume home meds, Echo no concerns     Consultants:  Cardiology  Procedures/Surgeries: none      ASSESSMENT & PLAN:   SIRS (systemic inflammatory response syndrome) / sepsis d/t rhinovirus  Meeting SIRS criteria w/ T 100.7, HR 140s-Afib  WBC 13.3 Lactate 2.2-->2.0 Trial off abx   Atrial fibrillation with RVR  Viral infection likely triggered Afib RVR Baseline atrial fibrillation on eliquis  Off dilt gtt Resume home meds   Chronic kidney disease, stage 3b  Cr 1.36 w/ GFR in the 50s At baseline  Monitor    Encephalopathy - improved per son  Mild generalized confusion on presentation  Follow closely   Benign prostatic hyperplasia with nocturia Cont flomax    Type 2 diabetes mellitus with hyperglycemia, with long-term current use of insulin (HCC) Blood sugars predominantly in 300s without overt DKA/HHS  SSI  A1C  Monitor    Acid reflux PPI   Hyperlipidemia Cont statin    Essential hypertension, benign BP stable  Titrate home regimen as needed         Borderline underweight based on BMI: Body mass index is 19.77 kg/m.  Underweight - under 18  overweight - 25 to 29 obese - 30 or more Class 1 obesity: BMI of 30.0 to 34 Class 2 obesity: BMI of 35.0 to 39 Class 3 obesity: BMI of 40.0 to 49 Super Morbid Obesity: BMI 50-59 Super-super Morbid Obesity: BMI 60+ Significantly low or high BMI is associated with higher medical risk.  Weight management advised as adjunct to other disease management and risk reduction treatments    DVT prophylaxis: eliquis  IV fluids: no continuous IV  fluids  Nutrition: cardiac diet Central lines / invasive devices: none  Code Status: FULL CODE ACP documentation reviewed: none on file in [facility]  TOC needs: TBD Barriers to dispo / significant pending items: if HR remains stable anticipate dc home tomorrw

## 2023-04-27 NOTE — Progress Notes (Signed)
 PROGRESS NOTE    Garrett Camp.   TOI:[id number] DOB: Sep 04, 1945  DOA: 04/26/2023 Date of Service: 04/27/23 which is hospital day 1  PCP: Berniece Salines, [facility] course / significant events:   Garrett Camp. is a 78 y.o. male with medical history significant of atrial fibrillation, type 2 diabetes, hypertension, hyperlipidemia presenting with SIRS, A-fib with RVR, hyperglycemia, encephalopathy. Admited to hosptialist for Afib RVR on dilt drip, (+)rhinovirus. Off dilt in afternoon 04/27/23, cardiology consults, ok to resume home meds, Echo no concerns     Consultants:  Cardiology  Procedures/Surgeries: none      ASSESSMENT & PLAN:   SIRS (systemic inflammatory response syndrome) / sepsis d/t rhinovirus  Meeting SIRS criteria w/ T 100.7, HR 140s-Afib  WBC 13.3 Lactate 2.2-->2.0 Trial off abx   Atrial fibrillation with RVR  Viral infection likely triggered Afib RVR Baseline atrial fibrillation on eliquis  Off dilt gtt Resume home meds   Chronic kidney disease, stage 3b  Cr 1.36 w/ GFR in the 50s At baseline  Monitor    Encephalopathy - improved per son  Mild generalized confusion on presentation  Follow closely   Benign prostatic hyperplasia with nocturia Cont flomax    Type 2 diabetes mellitus with hyperglycemia, with long-term current use of insulin (HCC) Blood sugars predominantly in 300s without overt DKA/HHS  SSI  A1C  Monitor    Acid reflux PPI   Hyperlipidemia Cont statin    Essential hypertension, benign BP stable  Titrate home regimen as needed         Borderline underweight based on BMI: Body mass index is 19.77 kg/m.  Underweight - under 18  overweight - 25 to 29 obese - 30 or more Class 1 obesity: BMI of 30.0 to 34 Class 2 obesity: BMI of 35.0 to 39 Class 3 obesity: BMI of 40.0 to 49 Super Morbid Obesity: BMI 50-59 Super-super Morbid Obesity: BMI 60+ Significantly low or high BMI is associated with higher  medical risk.  Weight management advised as adjunct to other disease management and risk reduction treatments    DVT prophylaxis: eliquis  IV fluids: no continuous IV fluids  Nutrition: cardiac diet Central lines / invasive devices: none  Code Status: FULL CODE ACP documentation reviewed: none on file in [facility]  TOC needs: TBD Barriers to dispo / significant pending items: if HR remains stable anticipate dc home tomorrw             Subjective / Brief ROS:  Patient reports feeling well, no palpiatations Denies CP/SOB.  Pain controlled.  Denies new weakness.  Tolerating diet.  Reports no concerns w/ urination/defecation.   Family Communication: son at bedisde on rounds     Objective Findings:  Vitals:   04/27/23 1500 04/27/23 1515 04/27/23 1530 04/27/23 1600  BP: (!) 119/57 (!) 110/55 120/69 (!) 131/109  Pulse: 94 90 100 83  Resp: (!) 23 15 18 18   Temp:      TempSrc:      SpO2: 90% 95% 93% 96%  Weight:      Height:        Intake/Output Summary (Last 24 hours) at 04/27/2023 1751 Last data filed at 04/27/2023 0030 Gross per 24 hour  Intake 1155.92 ml  Output --  Net 1155.92 ml   Filed Weights   04/26/23 1420  Weight: 59 kg    Examination:  Physical Exam Constitutional:      General: He is not in acute  distress. Cardiovascular:     Rate and Rhythm: Normal rate. Rhythm irregular.     Heart sounds: Normal heart sounds.  Pulmonary:     Effort: Pulmonary effort is normal.     Breath sounds: Normal breath sounds.  Musculoskeletal:     Right lower leg: No edema.     Left lower leg: No edema.  Skin:    General: Skin is warm and dry.  Neurological:     General: No focal deficit present.     Mental Status: He is alert. Mental status is at baseline.          Scheduled Medications:   apixaban  5 mg Oral BID   atorvastatin  40 mg Oral Daily   brinzolamide  1 drop Both Eyes TID   And   brimonidine  1 drop Both Eyes TID   digoxin  0.0625 mg  Oral Daily   finasteride  5 mg Oral Daily   insulin aspart  0-9 Units Subcutaneous Q4H   insulin glargine  10 Units Subcutaneous QHS   latanoprost  1 drop Both Eyes QHS   metoprolol succinate  100 mg Oral Daily   tamsulosin  0.4 mg Oral QPM    Continuous Infusions:  ceFEPime (MAXIPIME) IV Stopped (04/27/23 1156)   diltiazem (CARDIZEM) infusion Stopped (04/27/23 1554)   vancomycin 750 mg (04/27/23 1706)    PRN Medications:  ondansetron **OR** ondansetron (ZOFRAN) IV  Antimicrobials from admission:  Anti-infectives (From admission, onward)    Start     Dose/Rate Route Frequency Ordered Stop   04/27/23 1800  vancomycin (VANCOREADY) IVPB 750 mg/150 mL        750 mg 150 mL/hr over 60 Minutes Intravenous Every 24 hours 04/26/23 1817     04/26/23 2000  ceFEPIme (MAXIPIME) 2 g in sodium chloride 0.9 % 100 mL IVPB        2 g 200 mL/hr over 30 Minutes Intravenous Every 12 hours 04/26/23 1803 05/03/23 1959   04/26/23 1815  vancomycin (VANCOREADY) IVPB 500 mg/100 mL        500 mg 100 mL/hr over 60 Minutes Intravenous  Once 04/26/23 1802 04/26/23 1947   04/26/23 1715  vancomycin (VANCOCIN) IVPB 1000 mg/200 mL premix        1,000 mg 200 mL/hr over 60 Minutes Intravenous  Once 04/26/23 1711 04/26/23 1846   04/26/23 1430  cefTRIAXone (ROCEPHIN) 2 g in sodium chloride 0.9 % 100 mL IVPB        2 g 200 mL/hr over 30 Minutes Intravenous Once 04/26/23 1428 04/26/23 1630           Data Reviewed:  I have personally reviewed the following...  CBC: Recent Labs  Lab 04/26/23 1520 04/27/23 0542  WBC 13.3* 10.4  NEUTROABS 11.7*  --   HGB 10.6* 10.0*  HCT 32.0* 29.4*  MCV 93.6 92.7  PLT 333 273   Basic Metabolic Panel: Recent Labs  Lab 04/26/23 1500 04/27/23 0542  NA 132* 136  K 4.2 3.7  CL 98 102  CO2 24 24  GLUCOSE 353* 156*  BUN 19 21  CREATININE 1.36* 1.28*  CALCIUM 8.4* 8.4*   GFR: Estimated Creatinine Clearance: 41 mL/min (A) (by C-G formula based on SCr of 1.28  mg/dL (H)). Liver Function Tests: Recent Labs  Lab 04/26/23 1500 04/27/23 0542  AST 24 27  ALT 22 23  ALKPHOS 96 87  BILITOT 0.9 0.7  PROT 6.7 6.3*  ALBUMIN 3.9 3.6   No results  for input(s): "LIPASE", "AMYLASE" in the last 168 hours. Recent Labs  Lab 04/26/23 1848  AMMONIA 20   Coagulation Profile: Recent Labs  Lab 04/26/23 1519  INR 1.1   Cardiac Enzymes: No results for input(s): "CKTOTAL", "CKMB", "CKMBINDEX", "TROPONINI" in the last 168 hours. BNP (last 3 results) No results for input(s): "PROBNP" in the last 8760 hours. HbA1C: Recent Labs    04/26/23 1520  HGBA1C 7.8*   CBG: Recent Labs  Lab 04/26/23 2102 04/27/23 0134 04/27/23 0801 04/27/23 1208 04/27/23 1638  GLUCAP 392* 115* 154* 82 218*   Lipid Profile: No results for input(s): "CHOL", "HDL", "LDLCALC", "TRIG", "CHOLHDL", "LDLDIRECT" in the last 72 hours. Thyroid Function Tests: No results for input(s): "TSH", "T4TOTAL", "FREET4", "T3FREE", "THYROIDAB" in the last 72 hours. Anemia Panel: No results for input(s): "VITAMINB12", "FOLATE", "FERRITIN", "TIBC", "IRON", "RETICCTPCT" in the last 72 hours. Most Recent Urinalysis On File:     Component Value Date/Time   COLORURINE YELLOW (A) 04/26/2023 1422   APPEARANCEUR CLEAR (A) 04/26/2023 1422   APPEARANCEUR Clear 08/29/2020 1515   LABSPEC 1.021 04/26/2023 1422   PHURINE 7.0 04/26/2023 1422   GLUCOSEU >=500 (A) 04/26/2023 1422   HGBUR SMALL (A) 04/26/2023 1422   BILIRUBINUR NEGATIVE 04/26/2023 1422   BILIRUBINUR Negative 08/29/2020 1515   KETONESUR 5 (A) 04/26/2023 1422   PROTEINUR 30 (A) 04/26/2023 1422   NITRITE NEGATIVE 04/26/2023 1422   LEUKOCYTESUR NEGATIVE 04/26/2023 1422   Sepsis Labs: @LABRCNTIP (procalcitonin:4,lacticidven:4) Microbiology: Recent Results (from the past 240 hours)  Blood Culture (routine x 2)     Status: None (Preliminary result)   Collection Time: 04/26/23  3:00 PM   Specimen: BLOOD  Result Value Ref Range Status    Specimen Description BLOOD RIGHT ANTECUBITAL  Final   Special Requests   Final    BOTTLES DRAWN AEROBIC AND ANAEROBIC Blood Culture adequate volume   Culture   Final    NO GROWTH < 24 HOURS Performed at [facility], [address], [place], [place] [address]    Report Status PENDING  Incomplete  Blood Culture (routine x 2)     Status: None (Preliminary result)   Collection Time: 04/26/23  3:00 PM   Specimen: BLOOD  Result Value Ref Range Status   Specimen Description BLOOD BLOOD RIGHT WRIST  Final   Special Requests   Final    BOTTLES DRAWN AEROBIC AND ANAEROBIC Blood Culture adequate volume   Culture   Final    NO GROWTH < 24 HOURS Performed at [facility], [address], [place], [place] [address]    Report Status PENDING  Incomplete  Resp panel by RT-PCR (RSV, Flu A&B, Covid) Anterior Nasal Swab     Status: None   Collection Time: 04/26/23  4:43 PM   Specimen: Anterior Nasal Swab  Result Value Ref Range Status   SARS Coronavirus 2 by RT PCR NEGATIVE NEGATIVE Final    Comment: (NOTE) SARS-CoV-2 target nucleic acids are NOT DETECTED.  The SARS-CoV-2 RNA is generally detectable in upper respiratory specimens during the acute phase of infection. The lowest concentration of SARS-CoV-2 viral copies this assay can detect is 138 copies/mL. A negative result does not preclude SARS-Cov-2 infection and should not be used as the sole basis for treatment or other patient management decisions. A negative result may occur with  improper specimen collection/handling, submission of specimen other than nasopharyngeal swab, presence of viral mutation(s) within the areas targeted by this assay, and inadequate number of viral copies(<138 copies/mL).  A negative result must be combined with clinical observations, patient history, and epidemiological information. The expected result is Negative.  Fact Sheet for Patients:   BloggerCourse.com  Fact Sheet for Healthcare Providers:  SeriousBroker.it  This test is no t yet approved or cleared by the [place] FDA and  has been authorized for detection and/or diagnosis of SARS-CoV-2 by FDA under an Emergency Use Authorization (EUA). This EUA will remain  in effect (meaning this test can be used) for the duration of the COVID-19 declaration under Section 564(b)(1) of the Act, 21 U.S.C.section 360bbb-3(b)(1), unless the authorization is terminated  or revoked sooner.       Influenza A by PCR NEGATIVE NEGATIVE Final   Influenza B by PCR NEGATIVE NEGATIVE Final    Comment: (NOTE) The Xpert Xpress SARS-CoV-2/FLU/RSV plus assay is intended as an aid in the diagnosis of influenza from Nasopharyngeal swab specimens and should not be used as a sole basis for treatment. Nasal washings and aspirates are unacceptable for Xpert Xpress SARS-CoV-2/FLU/RSV testing.  Fact Sheet for Patients: BloggerCourse.com  Fact Sheet for Healthcare Providers: SeriousBroker.it  This test is not yet approved or cleared by the [place] FDA and has been authorized for detection and/or diagnosis of SARS-CoV-2 by FDA under an Emergency Use Authorization (EUA). This EUA will remain in effect (meaning this test can be used) for the duration of the COVID-19 declaration under Section 564(b)(1) of the Act, 21 U.S.C. section 360bbb-3(b)(1), unless the authorization is terminated or revoked.     Resp Syncytial Virus by PCR NEGATIVE NEGATIVE Final    Comment: (NOTE) Fact Sheet for Patients: BloggerCourse.com  Fact Sheet for Healthcare Providers: SeriousBroker.it  This test is not yet approved or cleared by the [place] FDA and has been authorized for detection and/or diagnosis of SARS-CoV-2 by FDA under an Emergency Use  Authorization (EUA). This EUA will remain in effect (meaning this test can be used) for the duration of the COVID-19 declaration under Section 564(b)(1) of the Act, 21 U.S.C. section 360bbb-3(b)(1), unless the authorization is terminated or revoked.  Performed at [facility], [address] [place], [place] [address]   Respiratory (~20 pathogens) panel by PCR     Status: Abnormal   Collection Time: 04/26/23  4:43 PM   Specimen: Nasopharyngeal Swab; Respiratory  Result Value Ref Range Status   Adenovirus NOT DETECTED NOT DETECTED Final   Coronavirus 229E NOT DETECTED NOT DETECTED Final    Comment: (NOTE) The Coronavirus on the Respiratory Panel, DOES NOT test for the novel  Coronavirus (2019 nCoV)    Coronavirus HKU1 NOT DETECTED NOT DETECTED Final   Coronavirus NL63 NOT DETECTED NOT DETECTED Final   Coronavirus OC43 NOT DETECTED NOT DETECTED Final   Metapneumovirus NOT DETECTED NOT DETECTED Final   Rhinovirus / Enterovirus DETECTED (A) NOT DETECTED Final   Influenza A NOT DETECTED NOT DETECTED Final   Influenza B NOT DETECTED NOT DETECTED Final   Parainfluenza Virus 1 NOT DETECTED NOT DETECTED Final   Parainfluenza Virus 2 NOT DETECTED NOT DETECTED Final   Parainfluenza Virus 3 NOT DETECTED NOT DETECTED Final   Parainfluenza Virus 4 NOT DETECTED NOT DETECTED Final   Respiratory Syncytial Virus NOT DETECTED NOT DETECTED Final   Bordetella pertussis NOT DETECTED NOT DETECTED Final   Bordetella Parapertussis NOT DETECTED NOT DETECTED Final   Chlamydophila pneumoniae NOT DETECTED NOT DETECTED Final   Mycoplasma pneumoniae NOT DETECTED NOT DETECTED Final    Comment: Performed at [facility],  [address] [place], [place] [address]      Radiology Studies last 3 days: ECHOCARDIOGRAM COMPLETE Result Date: 04/27/2023    ECHOCARDIOGRAM REPORT   Patient Name:   Garrett Hands. Date of Exam: 04/27/2023 Medical Rec #:  [id number]           Height:       68.0 in  Accession #:    [id number]          Weight:       130.0 lb Date of Birth:  1945/10/12            BSA:          1.702 m Patient Age:    77 years            BP:           139/119 mmHg Patient Gender: M                   HR:           96 bpm. Exam Location:  [facility] Procedure: 2D Echo, Cardiac Doppler and Color Doppler (Both Spectral and Color            Flow Doppler were utilized during procedure). Indications:     Atrial Fibrillation I48.91  History:         Patient has prior history of Echocardiogram examinations, most                  recent 01/30/2022. Arrythmias:Atrial Fibrillation,                  Signs/Symptoms:Murmur; Risk Factors:Hypertension.  Sonographer:     Cristela Blue Referring Phys:  [id number] Francoise Schaumann [place] Diagnosing Phys: Lorine Bears MD IMPRESSIONS  1. Left ventricular ejection fraction, by estimation, is 55 to 60%. The left ventricle has normal function. The left ventricle has no regional wall motion abnormalities. Left ventricular diastolic parameters are indeterminate.  2. Right ventricular systolic function is normal. The right ventricular size is normal. There is normal pulmonary artery systolic pressure.  3. Left atrial size was mildly dilated.  4. The mitral valve is normal in structure. Mild mitral valve regurgitation. No evidence of mitral stenosis.  5. The aortic valve is normal in structure. Aortic valve regurgitation is not visualized. No aortic stenosis is present.  6. The inferior vena cava is dilated in size with >50% respiratory variability, suggesting right atrial pressure of 8 mmHg. FINDINGS  Left Ventricle: Left ventricular ejection fraction, by estimation, is 55 to 60%. The left ventricle has normal function. The left ventricle has no regional wall motion abnormalities. The left ventricular internal cavity size was normal in size. There is  no left ventricular hypertrophy. Left ventricular diastolic parameters are indeterminate. Right Ventricle: The right ventricular size is normal. No  increase in right ventricular wall thickness. Right ventricular systolic function is normal. There is normal pulmonary artery systolic pressure. The tricuspid regurgitant velocity is 2.18 m/s, and  with an assumed right atrial pressure of 8 mmHg, the estimated right ventricular systolic pressure is 27.0 mmHg. Left Atrium: Left atrial size was mildly dilated. Right Atrium: Right atrial size was normal in size. Pericardium: There is no evidence of pericardial effusion. Mitral Valve: The mitral valve is normal in structure. Mild mitral valve regurgitation. No evidence of mitral valve stenosis. Tricuspid Valve: The tricuspid valve is normal in structure. Tricuspid valve regurgitation is trivial. No evidence of tricuspid stenosis. Aortic Valve: The aortic  valve is normal in structure. Aortic valve regurgitation is not visualized. No aortic stenosis is present. Aortic valve mean gradient measures 2.0 mmHg. Aortic valve peak gradient measures 4.5 mmHg. Aortic valve area, by VTI measures 1.96 cm. Pulmonic Valve: The pulmonic valve was normal in structure. Pulmonic valve regurgitation is trivial. No evidence of pulmonic stenosis. Aorta: The aortic root is normal in size and structure. Venous: The inferior vena cava is dilated in size with greater than 50% respiratory variability, suggesting right atrial pressure of 8 mmHg. IAS/Shunts: No atrial level shunt detected by color flow Doppler.  LEFT VENTRICLE PLAX 2D LVIDd:         3.20 cm LVIDs:         2.40 cm LV PW:         0.70 cm LV IVS:        1.00 cm LVOT diam:     2.10 cm LV SV:         30 LV SV Index:   17 LVOT Area:     3.46 cm  RIGHT VENTRICLE RV Basal diam:  3.10 cm RV Mid diam:    2.40 cm LEFT ATRIUM             Index        RIGHT ATRIUM           Index LA diam:        3.80 cm 2.23 cm/m   RA Area:     14.50 cm LA Vol (A2C):   53.9 ml 31.67 ml/m  RA Volume:   34.10 ml  20.04 ml/m LA Vol (A4C):   50.5 ml 29.67 ml/m LA Biplane Vol: 53.3 ml 31.32 ml/m  AORTIC  VALVE AV Area (Vmax):    2.19 cm AV Area (Vmean):   2.12 cm AV Area (VTI):     1.96 cm AV Vmax:           106.00 cm/s AV Vmean:          66.300 cm/s AV VTI:            0.151 m AV Peak Grad:      4.5 mmHg AV Mean Grad:      2.0 mmHg LVOT Vmax:         67.10 cm/s LVOT Vmean:        40.600 cm/s LVOT VTI:          0.085 m LVOT/AV VTI ratio: 0.56 MITRAL VALVE                TRICUSPID VALVE MV Area (PHT): 4.33 cm     TR Peak grad:   19.0 mmHg MV Decel Time: 175 msec     TR Vmax:        218.00 cm/s MV E velocity: 113.00 cm/s                             SHUNTS                             Systemic VTI:  0.09 m                             Systemic Diam: 2.10 cm Lorine Bears MD Electronically signed by Lorine Bears MD Signature Date/Time: 04/27/2023/2:41:27 PM    Final    CT HEAD WO CONTRAST (  ) Result Date: 04/26/2023 CLINICAL DATA:  Mental status change, unknown cause EXAM: CT HEAD WITHOUT CONTRAST TECHNIQUE: Contiguous axial images were obtained from the base of the skull through the vertex without intravenous contrast. RADIATION DOSE REDUCTION: This exam was performed according to the departmental dose-optimization program which includes automated exposure control, adjustment of the mA and/or kV according to patient size and/or use of iterative reconstruction technique. COMPARISON:  Head CT 08/28/2016 FINDINGS: Brain: Generalized atrophy with mild progression from 2018. no intracranial hemorrhage, mass effect, or midline shift. No hydrocephalus. The basilar cisterns are patent. There is mild periventricular and deep white matter hypodensity typical of chronic small vessel ischemia. No evidence of territorial infarct or acute ischemia. No extra-axial or intracranial fluid collection. Vascular: Atherosclerosis of skullbase vasculature without hyperdense vessel or abnormal calcification. Skull: No fracture or focal lesion. Sinuses/Orbits: Mucosal thickening involving right ethmoid air cells. No fluid levels. No  mastoid effusion. Other: None. IMPRESSION: 1. No acute intracranial abnormality. 2. Generalized atrophy and chronic small vessel ischemia. Electronically Signed   By: Narda Rutherford M.D.   On: 04/26/2023 18:47   DG Chest Port 1 View Result Date: 04/26/2023 CLINICAL DATA:  Questionable sepsis - evaluate for abnormality EXAM: PORTABLE CHEST 1 VIEW COMPARISON:  April 07, 2018 FINDINGS: The cardiomediastinal silhouette is unchanged in contour.Atherosclerotic calcifications no pleural effusion. No pneumothorax. No acute pleuroparenchymal abnormality. IMPRESSION: No acute cardiopulmonary abnormality. Electronically Signed   By: Meda Klinefelter M.D.   On: 04/26/2023 15:01       Sunnie Nielsen, DO [facility] 04/27/2023, 5:51 PM    Dictation software may have been used to generate the above note. Typos may occur and escape review in typed/dictated notes. Please contact Dr Lyn Hollingshead directly for clarity if needed.  Staff may message me via secure chat in Epic  but this may not receive an immediate response,  please page me for urgent matters!  If 7PM-7AM, please contact night coverage [email or web address]

## 2023-04-28 ENCOUNTER — Encounter: Payer: Self-pay | Admitting: Osteopathic Medicine

## 2023-04-28 DIAGNOSIS — R6510 Systemic inflammatory response syndrome (SIRS) of non-infectious origin without acute organ dysfunction: Secondary | ICD-10-CM | POA: Diagnosis not present

## 2023-04-28 LAB — CBG MONITORING, ED
Glucose-Capillary: 99 mg/dL (ref 70–99)
Glucose-Capillary: 89 mg/dL (ref 70–99)

## 2023-04-28 MED ORDER — FUROSEMIDE 20 MG PO TABS: ORAL_TABLET | ORAL | Status: DC

## 2023-04-28 NOTE — TOC CM/SW Note (Signed)
 Transition of Care [facility]) - Inpatient Brief Assessment   Patient Details  Name: Garrett Yoder. MRN: [id number] Date of Birth: [birth year]  [facility] of [facility]) CM/SW Contact:    Margarito Liner, LCSW Phone Number: 04/28/2023, 11:07 AM   Clinical Narrative: Patient has orders to discharge home today. CSW acknowledges consult for SNF/HH/DME needs. Per PT eval, patient will likely not need services at discharge. Patient left before he could be seen again. No DME recommendations. No further concerns. CSW signing off.  Transition of Care Asessment: Insurance and Status: Insurance coverage has been reviewed Patient has primary care physician: Yes Home environment has been reviewed: Single family home Prior level of function:: Independent Prior/Current Home Services: No current home services Social Drivers of Health Review: SDOH reviewed no interventions necessary Readmission risk has been reviewed: Yes Transition of care needs: no transition of care needs at this time

## 2023-04-28 NOTE — Discharge Summary (Signed)
 Physician Discharge Summary   Patient: Garrett Yoder. MRN: [id number]  DOB: April 14, 1945   Admit:     Date of Admission: 04/26/2023 Admitted from: home   Discharge: Date of discharge: 04/28/23 Disposition: Home Condition at discharge: good  CODE STATUS: FULL CODE     Discharge Physician: Sunnie Nielsen, DO [facility]     PCP: Berniece Salines, FNP  Recommendations for Outpatient Follow-up:  Follow up with PCP Berniece Salines, FNP in 1-2 weeks Please obtain labs/tests: BP recheck, HR recheck,  Follow w/ cardiology as directed   Discharge Instructions     Amb referral to AFIB Clinic   Complete by: As directed    Diet - low sodium heart healthy   Complete by: As directed    Increase activity slowly   Complete by: As directed          Discharge Diagnoses: Principal Problem:   SIRS (systemic inflammatory response syndrome) (HCC) Active Problems:   Atrial fibrillation with RVR (HCC)   Chronic kidney disease, stage 3b (HCC)   Essential hypertension, benign   Hyperlipidemia   Acid reflux   Type 2 diabetes mellitus with hyperglycemia, with long-term current use of insulin (HCC)   Benign prostatic hyperplasia with nocturia   Encephalopathy   Afib [facility])       Hospital course / significant events:   Garrett Yoder. is a 78 y.o. male with medical history significant of atrial fibrillation, type 2 diabetes, hypertension, hyperlipidemia presenting with SIRS, A-fib with RVR, hyperglycemia, encephalopathy. Admited to hosptialist for Afib RVR on dilt drip, (+)rhinovirus. Off dilt in afternoon 04/27/23, cardiology consults, ok to resume home meds, Echo no concerns     Consultants:  Cardiology  Procedures/Surgeries: none      ASSESSMENT & PLAN:   SIRS (systemic inflammatory response syndrome) / sepsis d/t rhinovirus  Meeting SIRS criteria w/ T 100.7, HR 140s-Afib  WBC 13.3 Lactate 2.2-->2.0 Trial off abx and has done okay, will not  continue abx for viral infection   Atrial fibrillation with RVR - rate improved  Viral infection likely triggered Afib RVR Baseline atrial fibrillation on eliquis  Off dilt gtt Resume home meds - cardiology in agreement for plan see med rec    Chronic kidney disease, stage 3b  Cr 1.36 w/ GFR in the 50s At baseline  Monitor outpatient    Encephalopathy - improved per son  Mild generalized confusion on presentation  Follow closely   Benign prostatic hyperplasia with nocturia Cont flomax    Type 2 diabetes mellitus with hyperglycemia, with long-term current use of insulin (HCC) Blood sugars predominantly in 300s without overt DKA/HHS  Resume home medications    Acid reflux PPI   Hyperlipidemia Cont statin    Essential hypertension, benign BP stable /on the low side, limits increase rate control medications  Titrate home regimen as needed w/ cardiology / PCP        Borderline underweight based on BMI: Body mass index is 19.77 kg/m.  Underweight - under 18  overweight - 25 to 29 obese - 30 or more Class 1 obesity: BMI of 30.0 to 34 Class 2 obesity: BMI of 35.0 to 39 Class 3 obesity: BMI of 40.0 to 49 Super Morbid Obesity: BMI 50-59 Super-super Morbid Obesity: BMI 60+ Significantly low or high BMI is associated with higher medical risk.  Weight management advised as adjunct to other disease management and risk reduction treatments  Discharge Instructions  Allergies as of 04/28/2023       Reactions   Codeine Nausea Only   Other reaction(s): makes him sick to his stomach Other reaction(s): makes him sick to his stomach        Medication List     TAKE these medications    acetaminophen 500 MG tablet Commonly known as: TYLENOL Take 500 mg by mouth 2 (two) times daily as needed.   atorvastatin 40 MG tablet Commonly known as: LIPITOR TAKE 1 TABLET BY MOUTH DAILY   bimatoprost 0.01 % Soln Commonly known as: LUMIGAN Place 1 drop into both  eyes at bedtime.   digoxin 0.125 MG tablet Commonly known as: LANOXIN Take 0.5 tablets (0.0625 mg total) by mouth daily.   Eliquis 5 MG Tabs tablet Generic drug: apixaban TAKE 1 TABLET BY MOUTH TWICE  DAILY   finasteride 5 MG tablet Commonly known as: PROSCAR TAKE 1 TABLET BY MOUTH DAILY   furosemide 20 MG tablet Commonly known as: Lasix Take 1 tablet (20 mg total) by mouth TWICE daily (total daily dose 40 mg) as needed for up to 3 days for increased leg swelling, shortness of breath, weight gain 5+ lbs over 1-2 days. Seek medical care if these symptoms are not improving with increased dose. What changed:  how much to take how to take this when to take this reasons to take this additional instructions   insulin lispro 100 UNIT/ML KwikPen Commonly known as: HUMALOG Inject into the skin daily. Pt taking 4 units in AM, 12 units at noon   Levemir FlexTouch 100 UNIT/ML FlexTouch Pen Generic drug: insulin detemir Inject 30 Units into the skin every evening.   metFORMIN 1000 MG tablet Commonly known as: GLUCOPHAGE Take 1,000 mg by mouth 2 (two) times daily with a meal.   metoprolol succinate 100 MG 24 hr tablet Commonly known as: TOPROL-XL TAKE 1 TABLET BY MOUTH  DAILY . TAKE WITH OR IMMEDIATELY FOLLOWING A MEAL   Multi-Vitamins Tabs Take 1 tablet by mouth 2 (two) times daily.   pioglitazone 15 MG tablet Commonly known as: ACTOS Take 15 mg by mouth daily.   Simbrinza 1-0.2 % Susp Generic drug: Brinzolamide-Brimonidine Place 1 drop into both eyes 3 (three) times daily.   tamsulosin 0.4 MG Caps capsule Commonly known as: FLOMAX TAKE 1 CAPSULE BY MOUTH DAILY         Follow-up Information     Berniece Salines, FNP. Schedule an appointment as soon as possible for a visit.   Specialty: Nurse Practitioner Why: hosptial follow up in 2-4 weeks Contact information: [address] [place] [address] [phone]         Sherie Don, NP.  Schedule an appointment as soon as possible for a visit.   Specialty: Cardiology Why: hostpial follow up afib in 1-2 weeks, sooner as needed if high heart rate or other concerns Contact information: [address] [place] [address] [phone]                 Allergies  Allergen Reactions   Codeine Nausea Only    Other reaction(s): makes him sick to his stomach Other reaction(s): makes him sick to his stomach     Subjective: pt feeling well this morning, no chest pain no palpitations, notes increased HR with activity but it goes back down with rest and he has no symptoms when it is elevated    Discharge Exam: BP 119/77   Pulse 87  Temp 98.9 F (37.2 C)   Resp 19   Ht 5\' 8"  (1.727 m)   Wt 59 kg   SpO2 96%   BMI 19.77 kg/m  General: Pt is alert, awake, not in acute distress Cardiovascular: Irreg rhythm, tachy/regular alternating  Respiratory: CTA bilaterally, no wheezing, no rhonchi Abdominal: Soft, NT, ND, bowel sounds + Extremities: no edema, no cyanosis Neuro: alert, oriented x3     The results of significant diagnostics from this hospitalization (including imaging, microbiology, ancillary and laboratory) are listed below for reference.     Microbiology: Recent Results (from the past 240 hours)  Blood Culture (routine x 2)     Status: None (Preliminary result)   Collection Time: 04/26/23  3:00 PM   Specimen: BLOOD  Result Value Ref Range Status   Specimen Description BLOOD RIGHT ANTECUBITAL  Final   Special Requests   Final    BOTTLES DRAWN AEROBIC AND ANAEROBIC Blood Culture adequate volume   Culture   Final    NO GROWTH 2 DAYS Performed at [facility], [address], [place], [place] [address]    Report Status PENDING  Incomplete  Blood Culture (routine x 2)     Status: None (Preliminary result)   Collection Time: 04/26/23  3:00 PM   Specimen: BLOOD  Result Value Ref Range Status   Specimen Description BLOOD BLOOD  RIGHT WRIST  Final   Special Requests   Final    BOTTLES DRAWN AEROBIC AND ANAEROBIC Blood Culture adequate volume   Culture   Final    NO GROWTH 2 DAYS Performed at [facility], [address], [place], [place] [address]    Report Status PENDING  Incomplete  Resp panel by RT-PCR (RSV, Flu A&B, Covid) Anterior Nasal Swab     Status: None   Collection Time: 04/26/23  4:43 PM   Specimen: Anterior Nasal Swab  Result Value Ref Range Status   SARS Coronavirus 2 by RT PCR NEGATIVE NEGATIVE Final    Comment: (NOTE) SARS-CoV-2 target nucleic acids are NOT DETECTED.  The SARS-CoV-2 RNA is generally detectable in upper respiratory specimens during the acute phase of infection. The lowest concentration of SARS-CoV-2 viral copies this assay can detect is 138 copies/mL. A negative result does not preclude SARS-Cov-2 infection and should not be used as the sole basis for treatment or other patient management decisions. A negative result may occur with  improper specimen collection/handling, submission of specimen other than nasopharyngeal swab, presence of viral mutation(s) within the areas targeted by this assay, and inadequate number of viral copies(<138 copies/mL). A negative result must be combined with clinical observations, patient history, and epidemiological information. The expected result is Negative.  Fact Sheet for Patients:  BloggerCourse.com  Fact Sheet for Healthcare Providers:  SeriousBroker.it  This test is no t yet approved or cleared by the [place] FDA and  has been authorized for detection and/or diagnosis of SARS-CoV-2 by FDA under an Emergency Use Authorization (EUA). This EUA will remain  in effect (meaning this test can be used) for the duration of the COVID-19 declaration under Section 564(b)(1) of the Act, 21 U.S.C.section 360bbb-3(b)(1), unless the authorization is terminated  or revoked sooner.        Influenza A by PCR NEGATIVE NEGATIVE Final   Influenza B by PCR NEGATIVE NEGATIVE Final    Comment: (NOTE) The Xpert Xpress SARS-CoV-2/FLU/RSV plus assay is intended as an aid in the diagnosis of influenza from Nasopharyngeal swab specimens and should not  be used as a sole basis for treatment. Nasal washings and aspirates are unacceptable for Xpert Xpress SARS-CoV-2/FLU/RSV testing.  Fact Sheet for Patients: BloggerCourse.com  Fact Sheet for Healthcare Providers: SeriousBroker.it  This test is not yet approved or cleared by the [place] FDA and has been authorized for detection and/or diagnosis of SARS-CoV-2 by FDA under an Emergency Use Authorization (EUA). This EUA will remain in effect (meaning this test can be used) for the duration of the COVID-19 declaration under Section 564(b)(1) of the Act, 21 U.S.C. section 360bbb-3(b)(1), unless the authorization is terminated or revoked.     Resp Syncytial Virus by PCR NEGATIVE NEGATIVE Final    Comment: (NOTE) Fact Sheet for Patients: BloggerCourse.com  Fact Sheet for Healthcare Providers: SeriousBroker.it  This test is not yet approved or cleared by the [place] FDA and has been authorized for detection and/or diagnosis of SARS-CoV-2 by FDA under an Emergency Use Authorization (EUA). This EUA will remain in effect (meaning this test can be used) for the duration of the COVID-19 declaration under Section 564(b)(1) of the Act, 21 U.S.C. section 360bbb-3(b)(1), unless the authorization is terminated or revoked.  Performed at [facility], [address] [place], [place] [address]   Respiratory (~20 pathogens) panel by PCR     Status: Abnormal   Collection Time: 04/26/23  4:43 PM   Specimen: Nasopharyngeal Swab; Respiratory  Result Value Ref Range Status   Adenovirus NOT DETECTED NOT DETECTED Final    Coronavirus 229E NOT DETECTED NOT DETECTED Final    Comment: (NOTE) The Coronavirus on the Respiratory Panel, DOES NOT test for the novel  Coronavirus (2019 nCoV)    Coronavirus HKU1 NOT DETECTED NOT DETECTED Final   Coronavirus NL63 NOT DETECTED NOT DETECTED Final   Coronavirus OC43 NOT DETECTED NOT DETECTED Final   Metapneumovirus NOT DETECTED NOT DETECTED Final   Rhinovirus / Enterovirus DETECTED (A) NOT DETECTED Final   Influenza A NOT DETECTED NOT DETECTED Final   Influenza B NOT DETECTED NOT DETECTED Final   Parainfluenza Virus 1 NOT DETECTED NOT DETECTED Final   Parainfluenza Virus 2 NOT DETECTED NOT DETECTED Final   Parainfluenza Virus 3 NOT DETECTED NOT DETECTED Final   Parainfluenza Virus 4 NOT DETECTED NOT DETECTED Final   Respiratory Syncytial Virus NOT DETECTED NOT DETECTED Final   Bordetella pertussis NOT DETECTED NOT DETECTED Final   Bordetella Parapertussis NOT DETECTED NOT DETECTED Final   Chlamydophila pneumoniae NOT DETECTED NOT DETECTED Final   Mycoplasma pneumoniae NOT DETECTED NOT DETECTED Final    Comment: Performed at [facility], [address] [place], [place] [address]     Labs: BNP (last 3 results) Recent Labs    01/05/23 1358  BNP 184*   Basic Metabolic Panel: Recent Labs  Lab 04/26/23 1500 04/27/23 0542  NA 132* 136  K 4.2 3.7  CL 98 102  CO2 24 24  GLUCOSE 353* 156*  BUN 19 21  CREATININE 1.36* 1.28*  CALCIUM 8.4* 8.4*   Liver Function Tests: Recent Labs  Lab 04/26/23 1500 04/27/23 0542  AST 24 27  ALT 22 23  ALKPHOS 96 87  BILITOT 0.9 0.7  PROT 6.7 6.3*  ALBUMIN 3.9 3.6   No results for input(s): "LIPASE", "AMYLASE" in the last 168 hours. Recent Labs  Lab 04/26/23 1848  AMMONIA 20   CBC: Recent Labs  Lab 04/26/23 1520 04/27/23 0542  WBC 13.3* 10.4  NEUTROABS 11.7*  --   HGB 10.6* 10.0*  HCT  32.0* 29.4*  MCV 93.6 92.7  PLT 333 273   Cardiac Enzymes: No results for input(s): "CKTOTAL", "CKMB",  "CKMBINDEX", "TROPONINI" in the last 168 hours. BNP: Invalid input(s): "POCBNP" CBG: Recent Labs  Lab 04/27/23 1208 04/27/23 1638 04/27/23 2020 04/28/23 0347 04/28/23 0809  GLUCAP 82 218* 109* 99 89   D-Dimer No results for input(s): "DDIMER" in the last 72 hours. Hgb A1c Recent Labs    04/26/23 1520  HGBA1C 7.8*   Lipid Profile No results for input(s): "CHOL", "HDL", "LDLCALC", "TRIG", "CHOLHDL", "LDLDIRECT" in the last 72 hours. Thyroid function studies No results for input(s): "TSH", "T4TOTAL", "T3FREE", "THYROIDAB" in the last 72 hours.  Invalid input(s): "FREET3" Anemia work up No results for input(s): "VITAMINB12", "FOLATE", "FERRITIN", "TIBC", "IRON", "RETICCTPCT" in the last 72 hours. Urinalysis    Component Value Date/Time   COLORURINE YELLOW (A) 04/26/2023 1422   APPEARANCEUR CLEAR (A) 04/26/2023 1422   APPEARANCEUR Clear 08/29/2020 1515   LABSPEC 1.021 04/26/2023 1422   PHURINE 7.0 04/26/2023 1422   GLUCOSEU >=500 (A) 04/26/2023 1422   HGBUR SMALL (A) 04/26/2023 1422   BILIRUBINUR NEGATIVE 04/26/2023 1422   BILIRUBINUR Negative 08/29/2020 1515   KETONESUR 5 (A) 04/26/2023 1422   PROTEINUR 30 (A) 04/26/2023 1422   NITRITE NEGATIVE 04/26/2023 1422   LEUKOCYTESUR NEGATIVE 04/26/2023 1422   Sepsis Labs Recent Labs  Lab 04/26/23 1520 04/27/23 0542  WBC 13.3* 10.4   Microbiology Recent Results (from the past 240 hours)  Blood Culture (routine x 2)     Status: None (Preliminary result)   Collection Time: 04/26/23  3:00 PM   Specimen: BLOOD  Result Value Ref Range Status   Specimen Description BLOOD RIGHT ANTECUBITAL  Final   Special Requests   Final    BOTTLES DRAWN AEROBIC AND ANAEROBIC Blood Culture adequate volume   Culture   Final    NO GROWTH 2 DAYS Performed at [facility], [address], [place], [place] [address]    Report Status PENDING  Incomplete  Blood Culture (routine x 2)     Status: None (Preliminary result)    Collection Time: 04/26/23  3:00 PM   Specimen: BLOOD  Result Value Ref Range Status   Specimen Description BLOOD BLOOD RIGHT WRIST  Final   Special Requests   Final    BOTTLES DRAWN AEROBIC AND ANAEROBIC Blood Culture adequate volume   Culture   Final    NO GROWTH 2 DAYS Performed at [facility], [address], [place], [place] [address]    Report Status PENDING  Incomplete  Resp panel by RT-PCR (RSV, Flu A&B, Covid) Anterior Nasal Swab     Status: None   Collection Time: 04/26/23  4:43 PM   Specimen: Anterior Nasal Swab  Result Value Ref Range Status   SARS Coronavirus 2 by RT PCR NEGATIVE NEGATIVE Final    Comment: (NOTE) SARS-CoV-2 target nucleic acids are NOT DETECTED.  The SARS-CoV-2 RNA is generally detectable in upper respiratory specimens during the acute phase of infection. The lowest concentration of SARS-CoV-2 viral copies this assay can detect is 138 copies/mL. A negative result does not preclude SARS-Cov-2 infection and should not be used as the sole basis for treatment or other patient management decisions. A negative result may occur with  improper specimen collection/handling, submission of specimen other than nasopharyngeal swab, presence of viral mutation(s) within the areas targeted by this assay, and inadequate number of viral copies(<138 copies/mL). A negative result must be combined with clinical  observations, patient history, and epidemiological information. The expected result is Negative.  Fact Sheet for Patients:  BloggerCourse.com  Fact Sheet for Healthcare Providers:  SeriousBroker.it  This test is no t yet approved or cleared by the [place] FDA and  has been authorized for detection and/or diagnosis of SARS-CoV-2 by FDA under an Emergency Use Authorization (EUA). This EUA will remain  in effect (meaning this test can be used) for the duration of the COVID-19 declaration under  Section 564(b)(1) of the Act, 21 U.S.C.section 360bbb-3(b)(1), unless the authorization is terminated  or revoked sooner.       Influenza A by PCR NEGATIVE NEGATIVE Final   Influenza B by PCR NEGATIVE NEGATIVE Final    Comment: (NOTE) The Xpert Xpress SARS-CoV-2/FLU/RSV plus assay is intended as an aid in the diagnosis of influenza from Nasopharyngeal swab specimens and should not be used as a sole basis for treatment. Nasal washings and aspirates are unacceptable for Xpert Xpress SARS-CoV-2/FLU/RSV testing.  Fact Sheet for Patients: BloggerCourse.com  Fact Sheet for Healthcare Providers: SeriousBroker.it  This test is not yet approved or cleared by the [place] FDA and has been authorized for detection and/or diagnosis of SARS-CoV-2 by FDA under an Emergency Use Authorization (EUA). This EUA will remain in effect (meaning this test can be used) for the duration of the COVID-19 declaration under Section 564(b)(1) of the Act, 21 U.S.C. section 360bbb-3(b)(1), unless the authorization is terminated or revoked.     Resp Syncytial Virus by PCR NEGATIVE NEGATIVE Final    Comment: (NOTE) Fact Sheet for Patients: BloggerCourse.com  Fact Sheet for Healthcare Providers: SeriousBroker.it  This test is not yet approved or cleared by the [place] FDA and has been authorized for detection and/or diagnosis of SARS-CoV-2 by FDA under an Emergency Use Authorization (EUA). This EUA will remain in effect (meaning this test can be used) for the duration of the COVID-19 declaration under Section 564(b)(1) of the Act, 21 U.S.C. section 360bbb-3(b)(1), unless the authorization is terminated or revoked.  Performed at [facility], [address] [place], [place] [address]   Respiratory (~20 pathogens) panel by PCR     Status: Abnormal   Collection Time: 04/26/23  4:43 PM    Specimen: Nasopharyngeal Swab; Respiratory  Result Value Ref Range Status   Adenovirus NOT DETECTED NOT DETECTED Final   Coronavirus 229E NOT DETECTED NOT DETECTED Final    Comment: (NOTE) The Coronavirus on the Respiratory Panel, DOES NOT test for the novel  Coronavirus (2019 nCoV)    Coronavirus HKU1 NOT DETECTED NOT DETECTED Final   Coronavirus NL63 NOT DETECTED NOT DETECTED Final   Coronavirus OC43 NOT DETECTED NOT DETECTED Final   Metapneumovirus NOT DETECTED NOT DETECTED Final   Rhinovirus / Enterovirus DETECTED (A) NOT DETECTED Final   Influenza A NOT DETECTED NOT DETECTED Final   Influenza B NOT DETECTED NOT DETECTED Final   Parainfluenza Virus 1 NOT DETECTED NOT DETECTED Final   Parainfluenza Virus 2 NOT DETECTED NOT DETECTED Final   Parainfluenza Virus 3 NOT DETECTED NOT DETECTED Final   Parainfluenza Virus 4 NOT DETECTED NOT DETECTED Final   Respiratory Syncytial Virus NOT DETECTED NOT DETECTED Final   Bordetella pertussis NOT DETECTED NOT DETECTED Final   Bordetella Parapertussis NOT DETECTED NOT DETECTED Final   Chlamydophila pneumoniae NOT DETECTED NOT DETECTED Final   Mycoplasma pneumoniae NOT DETECTED NOT DETECTED Final    Comment: Performed at [facility], [address], [place], [place] [address]  Imaging ECHOCARDIOGRAM COMPLETE Result Date: 04/27/2023    ECHOCARDIOGRAM REPORT   Patient Name:   Garrett Yoder. Date of Exam: 04/27/2023 Medical Rec #:  [id number]           Height:       68.0 in Accession #:    [id number]          Weight:       130.0 lb Date of Birth:  1945/10/07            BSA:          1.702 m Patient Age:    77 years            BP:           139/119 mmHg Patient Gender: M                   HR:           96 bpm. Exam Location:  [facility] Procedure: 2D Echo, Cardiac Doppler and Color Doppler (Both Spectral and Color            Flow Doppler were utilized during procedure). Indications:     Atrial Fibrillation I48.91  History:         Patient has  prior history of Echocardiogram examinations, most                  recent 01/30/2022. Arrythmias:Atrial Fibrillation,                  Signs/Symptoms:Murmur; Risk Factors:Hypertension.  Sonographer:     Cristela Blue Referring Phys:  [id number] Francoise Schaumann [place] Diagnosing Phys: Lorine Bears MD IMPRESSIONS  1. Left ventricular ejection fraction, by estimation, is 55 to 60%. The left ventricle has normal function. The left ventricle has no regional wall motion abnormalities. Left ventricular diastolic parameters are indeterminate.  2. Right ventricular systolic function is normal. The right ventricular size is normal. There is normal pulmonary artery systolic pressure.  3. Left atrial size was mildly dilated.  4. The mitral valve is normal in structure. Mild mitral valve regurgitation. No evidence of mitral stenosis.  5. The aortic valve is normal in structure. Aortic valve regurgitation is not visualized. No aortic stenosis is present.  6. The inferior vena cava is dilated in size with >50% respiratory variability, suggesting right atrial pressure of 8 mmHg. FINDINGS  Left Ventricle: Left ventricular ejection fraction, by estimation, is 55 to 60%. The left ventricle has normal function. The left ventricle has no regional wall motion abnormalities. The left ventricular internal cavity size was normal in size. There is  no left ventricular hypertrophy. Left ventricular diastolic parameters are indeterminate. Right Ventricle: The right ventricular size is normal. No increase in right ventricular wall thickness. Right ventricular systolic function is normal. There is normal pulmonary artery systolic pressure. The tricuspid regurgitant velocity is 2.18 m/s, and  with an assumed right atrial pressure of 8 mmHg, the estimated right ventricular systolic pressure is 27.0 mmHg. Left Atrium: Left atrial size was mildly dilated. Right Atrium: Right atrial size was normal in size. Pericardium: There is no evidence of pericardial  effusion. Mitral Valve: The mitral valve is normal in structure. Mild mitral valve regurgitation. No evidence of mitral valve stenosis. Tricuspid Valve: The tricuspid valve is normal in structure. Tricuspid valve regurgitation is trivial. No evidence of tricuspid stenosis. Aortic Valve: The aortic valve is normal in structure. Aortic valve regurgitation is not visualized. No aortic stenosis is present.  Aortic valve mean gradient measures 2.0 mmHg. Aortic valve peak gradient measures 4.5 mmHg. Aortic valve area, by VTI measures 1.96 cm. Pulmonic Valve: The pulmonic valve was normal in structure. Pulmonic valve regurgitation is trivial. No evidence of pulmonic stenosis. Aorta: The aortic root is normal in size and structure. Venous: The inferior vena cava is dilated in size with greater than 50% respiratory variability, suggesting right atrial pressure of 8 mmHg. IAS/Shunts: No atrial level shunt detected by color flow Doppler.  LEFT VENTRICLE PLAX 2D LVIDd:         3.20 cm LVIDs:         2.40 cm LV PW:         0.70 cm LV IVS:        1.00 cm LVOT diam:     2.10 cm LV SV:         30 LV SV Index:   17 LVOT Area:     3.46 cm  RIGHT VENTRICLE RV Basal diam:  3.10 cm RV Mid diam:    2.40 cm LEFT ATRIUM             Index        RIGHT ATRIUM           Index LA diam:        3.80 cm 2.23 cm/m   RA Area:     14.50 cm LA Vol (A2C):   53.9 ml 31.67 ml/m  RA Volume:   34.10 ml  20.04 ml/m LA Vol (A4C):   50.5 ml 29.67 ml/m LA Biplane Vol: 53.3 ml 31.32 ml/m  AORTIC VALVE AV Area (Vmax):    2.19 cm AV Area (Vmean):   2.12 cm AV Area (VTI):     1.96 cm AV Vmax:           106.00 cm/s AV Vmean:          66.300 cm/s AV VTI:            0.151 m AV Peak Grad:      4.5 mmHg AV Mean Grad:      2.0 mmHg LVOT Vmax:         67.10 cm/s LVOT Vmean:        40.600 cm/s LVOT VTI:          0.085 m LVOT/AV VTI ratio: 0.56 MITRAL VALVE                TRICUSPID VALVE MV Area (PHT): 4.33 cm     TR Peak grad:   19.0 mmHg MV Decel Time: 175  msec     TR Vmax:        218.00 cm/s MV E velocity: 113.00 cm/s                             SHUNTS                             Systemic VTI:  0.09 m                             Systemic Diam: 2.10 cm Lorine Bears MD Electronically signed by Lorine Bears MD Signature Date/Time: 04/27/2023/2:41:27 PM    Final       Time coordinating discharge: over 30 minutes  SIGNED:  Sunnie Nielsen DO [facility]

## 2023-04-28 NOTE — Progress Notes (Signed)
   Patient Name: Garrett Yoder. Date of Encounter: 04/28/2023 [facility] Cardiologist: Lorine Bears, MD   Interval Summary  .    Patient is feeling better. He says he is ready to go home. Heart rates are in the 90s. He is off the IV dilt. No chest pain or SOB reported.   Vital Signs .    Vitals:   04/28/23 0215 04/28/23 0216 04/28/23 0400 04/28/23 0545  BP:   111/64 106/62  Pulse: 78 78 77 (!) 104  Resp:  10 18 18   Temp:    98.9 F (37.2 C)  TempSrc:      SpO2: 96% 98% 96% 97%  Weight:      Height:        Intake/Output Summary (Last 24 hours) at 04/28/2023 0835 Last data filed at 04/27/2023 2143 Gross per 24 hour  Intake 100 ml  Output --  Net 100 ml      04/26/2023    2:20 PM 02/04/2023   10:31 AM 01/20/2023    3:11 PM  Last 3 Weights  Weight (lbs) 130 lb 138 lb 9.6 oz 139 lb  Weight (kg) 58.968 kg 62.869 kg 63.05 kg      Telemetry/ECG    Afib HR 90s - Personally Reviewed  Physical Exam .   GEN: No acute distress.   Neck: No JVD Cardiac: Irreg Irreg, no murmurs, rubs, or gallops.  Respiratory: diffusely diminished GI: Soft, nontender, non-distended  MS: No edema  Assessment & Plan .     SIRS -Mild fever on presentation with heart rate 140s, WBC 13.3 and lactate 2.2>2.0 - IVF - Respiratory panel + Rhinovirus - BC pending -Antibiotics per IM   Encephalopathy -Confusion on presentation, but may have baseline dementia per son report -CT head unremarkable -In the setting of hyperglycemia and SIRS - IVF   A-fib with RVR History of permanent A-fib -he is followed by EP as outpatient - On presentation heart rate in the 140s started on IV Dilt with improvement. He is off the IV Dilt - suspect exacerbated rates in the setting of metabolic derangements and rhino virus infection -PTA Toprol  100mg  daily and digoxin 0.125mg  daily restarted - echo showed LVEF 55-60%, no WMA, normal RVSF, mild MR - continue Eliquis for stroke ppx   CKD stage  III -Serum creatinine 1.28, which is stable   Type 2 diabetes with hyperglycemia -Blood sugars in the 300s - A1C 7.8 -Per IM   Elevated BNP - BNP 184 - echo with normal LVEF - PTA torsemide as needed - does not appear volume up on exam  For questions or updates, please contact [facility] Please consult [email or web address] for contact info under        Signed, Mykiah Schmuck David Stall, PA-C

## 2023-04-28 NOTE — ED Notes (Signed)
 Pt changed into blue scrub pants from burgundy.

## 2023-04-28 NOTE — ED Notes (Signed)
 Pt given breakfast tray

## 2023-04-29 ENCOUNTER — Inpatient Hospital Stay

## 2023-05-01 LAB — CULTURE, BLOOD (ROUTINE X 2)
Culture: NO GROWTH
Culture: NO GROWTH
Special Requests: ADEQUATE
Special Requests: ADEQUATE

## 2023-05-11 NOTE — Progress Notes (Unsigned)
 Electrophysiology Clinic Note    Date:  05/12/2023  Patient ID:  Garrett Ybarra., DOB 12/31/1945, MRN [id number] PCP:  Berniece Salines, FNP  Cardiologist:  Lorine Bears, MD Electrophysiologist: Lanier Prude, MD   Discussed the use of AI scribe software for clinical note transcription with the patient, who gave verbal consent to proceed.   Patient Profile    Chief Complaint: AFib, hosp follow-up  History of Present Illness: Garrett Lowdermilk. is a 78 y.o. male with PMH notable for perm AFib, CAD, HTN ; seen today for Lanier Prude, MD for post hospital follow up.    Admitted 3/9-3/12/2023 with SIRS and AFib w RVR. He was positive for rhinovirus, started on dilt gtt. He was discharged on 100mg  toprol BID, 0.653mcg dig.   On follow-up today, he is overall feeling well. He has slowly been increasing his physical activity walking dog, but is not walking as far as he was prior to hospitalization. No chest pain, chest pressure, dizziness, palpitations. No lower extremity edema.   Continues to take eliquis BID, no bleeding issues.  He did not bring his pulse, BP log,  but has it at home.   His sister, Garrett Yoder, joins for appt.     Arrhythmia/Device History No specialty comments available.    ROS:  Please see the history of present illness. All other systems are reviewed and otherwise negative.    Physical Exam    VS:  BP 133/74 (BP Location: Left Arm, Patient Position: Sitting, Cuff Size: Normal)   Pulse (!) 107   Ht 5\' 8"  (1.727 m)   Wt 133 lb 9.6 oz (60.6 kg)   SpO2 98%   BMI 20.31 kg/m  BMI: Body mass index is 20.31 kg/m.  Wt Readings from Last 3 Encounters:  05/12/23 133 lb 9.6 oz (60.6 kg)  04/26/23 130 lb (59 kg)  02/04/23 138 lb 9.6 oz (62.9 kg)     GEN- The patient is well appearing, alert and oriented x 3 today.   Lungs- Clear to ausculation bilaterally, normal work of breathing.  Heart- Irregularly irregular rate and rhythm, no murmurs, rubs  or gallops Extremities- No peripheral edema, warm, dry    Studies Reviewed   Previous EP, cardiology notes.    EKG is ordered. Personal review of EKG from today shows:    EKG Interpretation Date/Time:  Tuesday May 12 2023 10:35:48 EDT Ventricular Rate:  107 PR Interval:    QRS Duration:  66 QT Interval:  340 QTC Calculation: 453 R Axis:   79  Text Interpretation: Atrial fibrillation with rapid ventricular response ST & T wave abnormality, consider inferolateral ischemia Confirmed by Sherie Don [phone]) on 05/12/2023 10:38:36 AM    TTE, 04/27/2023  1. Left ventricular ejection fraction, by estimation, is 55 to 60%. The left ventricle has normal function. The left ventricle has no regional wall motion abnormalities. Left ventricular diastolic parameters are indeterminate.   2. Right ventricular systolic function is normal. The right ventricular size is normal. There is normal pulmonary artery systolic pressure.   3. Left atrial size was mildly dilated.   4. The mitral valve is normal in structure. Mild mitral valve regurgitation. No evidence of mitral stenosis.   5. The aortic valve is normal in structure. Aortic valve regurgitation is not visualized. No aortic stenosis is present.   6. The inferior vena cava is dilated in size with >50% respiratory variability, suggesting right atrial pressure of 8 mmHg.  TTE, 01/30/2022  1. Left ventricular ejection fraction, by estimation, is 50 to 55%. The left ventricle has low normal function. The left ventricle has no regional wall motion abnormalities. Left ventricular diastolic parameters are indeterminate.   2. Right ventricular systolic function is normal. The right ventricular size is normal. There is normal pulmonary artery systolic pressure. The estimated right ventricular systolic pressure is 34.2 mmHg.   3. Right atrial size was mildly dilated.   4. The mitral valve is normal in structure. Mild mitral valve regurgitation. No  evidence of mitral stenosis.   5. Tricuspid valve regurgitation is moderate.   6. The aortic valve is normal in structure. Aortic valve regurgitation is not visualized. Aortic valve sclerosis/calcification is present, without any evidence of aortic stenosis.   7. The inferior vena cava is normal in size with greater than 50% respiratory variability, suggesting right atrial pressure of 3 mmHg.   8. Rhythm is atrial fibrillation rate 90 to 130 bpm    Assessment and Plan     #) perm afib Recently hospitalized with AFib w rvr up to 120-140s iso rhinovirus. Updated TTE with preserved LVE.  He remains asymptomatic of afib, unclear ventricular control.  Asked him to either call w BP/pulse log or send via my chart At this time, continue 100mg  toprol daily, 0.0639mcg dig daily Consider addition of amiodarone if home ventricular rates remain elevated Increase physical activity as tolerated   #) Hypercoag d/t perm afib CHA2DS2-VASc Score = at least 5 [CHF History: 0, HTN History: 1, Diabetes History: 1, Stroke History: 0, Vascular Disease History: 1, Age Score: 2, Gender Score: 0].  Therefore, the patient's annual risk of stroke is 7.2 %.    Stroke ppx - 5mg  eliquis BID, appropriately dosed No bleeding concerns  #) HTN Above goal today.  He is to send him home pulse/bp log  Continue metop as above         Current medicines are reviewed at length with the patient today.   The patient does not have concerns regarding his medicines.  The following changes were made today:  none  Labs/ tests ordered today include:  Orders Placed This Encounter  Procedures   EKG 12-Lead     Disposition: Follow up with Dr. Lalla Brothers or EP APP  4 months    Signed, Sherie Don, NP  05/12/23  12:01 PM  Electrophysiology [facility]

## 2023-05-12 ENCOUNTER — Inpatient Hospital Stay

## 2023-05-12 ENCOUNTER — Ambulatory Visit: Attending: Cardiology | Admitting: Cardiology

## 2023-05-12 VITALS — BP 133/74 | HR 107 | Ht 68.0 in | Wt 133.6 lb

## 2023-05-12 DIAGNOSIS — D6869 Other thrombophilia: Secondary | ICD-10-CM | POA: Diagnosis not present

## 2023-05-12 DIAGNOSIS — I4821 Permanent atrial fibrillation: Secondary | ICD-10-CM | POA: Diagnosis not present

## 2023-05-12 DIAGNOSIS — I10 Essential (primary) hypertension: Secondary | ICD-10-CM | POA: Diagnosis not present

## 2023-05-12 NOTE — Patient Instructions (Signed)
 Medication Instructions:  The current medical regimen is effective;  continue present plan and medications.  *If you need a refill on your cardiac medications before your next appointment, please call your pharmacy*   Follow-Up: At [facility], you and your health needs are our priority.  As part of our continuing mission to provide you with exceptional heart care, we have created designated Provider Care Teams.  These Care Teams include your primary Cardiologist (physician) and Advanced Practice Providers (APPs -  Physician Assistants and Nurse Practitioners) who all work together to provide you with the care you need, when you need it.  We recommend signing up for the patient portal called "MyChart".  Sign up information is provided on this After Visit Summary.  MyChart is used to connect with patients for Virtual Visits (Telemedicine).  Patients are able to view lab/test results, encounter notes, upcoming appointments, etc.  Non-urgent messages can be sent to your provider as well.   To learn more about what you can do with MyChart, go to [email or web address].    Your next appointment:   4 month(s)  Provider:   Sherie Don, NP

## 2023-05-13 NOTE — Progress Notes (Unsigned)
 There were no vitals taken for this visit.   Subjective:    Patient ID: Garrett Camp., male    DOB: [birth year], 78 y.o.   MRN: [id number]  HPI: Garrett Radabaugh. is a 78 y.o. male  No chief complaint on file.   Discussed the use of AI scribe software for clinical note transcription with the patient, who gave verbal consent to proceed.  History of Present Illness          01/05/2023    1:36 PM 10/10/2022    1:13 PM 07/03/2022   12:42 PM  Depression screen PHQ 2/9  Decreased Interest 0 0 0  Down, Depressed, Hopeless 0 0 1  PHQ - 2 Score 0 0 1  Altered sleeping   0  Tired, decreased energy   0  Change in appetite   0  Feeling bad or failure about yourself    0  Trouble concentrating   0  Moving slowly or fidgety/restless   0  Suicidal thoughts   0  PHQ-9 Score   1  Difficult doing work/chores   Somewhat difficult    Relevant past medical, surgical, family and social history reviewed and updated as indicated. Interim medical history since our last visit reviewed. Allergies and medications reviewed and updated.  Review of Systems  Per HPI unless specifically indicated above     Objective:    There were no vitals taken for this visit.  {Vitals History (Optional):23777} Wt Readings from Last 3 Encounters:  05/12/23 133 lb 9.6 oz (60.6 kg)  04/26/23 130 lb (59 kg)  02/04/23 138 lb 9.6 oz (62.9 kg)    Physical Exam Physical Exam    Results for orders placed or performed during the hospital encounter of 04/26/23  CBG monitoring, ED   Collection Time: 04/26/23  2:19 PM  Result Value Ref Range   Glucose-Capillary 353 (H) 70 - 99 mg/dL  Urinalysis, w/ Reflex to Culture (Infection Suspected) -Urine, Clean Catch   Collection Time: 04/26/23  2:22 PM  Result Value Ref Range   Specimen Source URINE, CLEAN CATCH    Color, Urine YELLOW (A) YELLOW   APPearance CLEAR (A) CLEAR   Specific Gravity, Urine 1.021 1.005 - 1.030   pH 7.0 5.0 - 8.0   Glucose, UA  >=500 (A) NEGATIVE mg/dL   Hgb urine dipstick SMALL (A) NEGATIVE   Bilirubin Urine NEGATIVE NEGATIVE   Ketones, ur 5 (A) NEGATIVE mg/dL   Protein, ur 30 (A) NEGATIVE mg/dL   Nitrite NEGATIVE NEGATIVE   Leukocytes,Ua NEGATIVE NEGATIVE   RBC / HPF 0-5 0 - 5 RBC/hpf   WBC, UA 0-5 0 - 5 WBC/hpf   Bacteria, UA NONE SEEN NONE SEEN   Squamous Epithelial / HPF 0-5 0 - 5 /HPF  Blood Culture (routine x 2)   Collection Time: 04/26/23  3:00 PM   Specimen: BLOOD  Result Value Ref Range   Specimen Description BLOOD RIGHT ANTECUBITAL    Special Requests      BOTTLES DRAWN AEROBIC AND ANAEROBIC Blood Culture adequate volume   Culture      NO GROWTH 5 DAYS Performed at [facility], [address] [place], [place] [address]    Report Status 05/01/2023 FINAL   Blood Culture (routine x 2)   Collection Time: 04/26/23  3:00 PM   Specimen: BLOOD  Result Value Ref Range   Specimen Description BLOOD BLOOD RIGHT WRIST    Special Requests  BOTTLES DRAWN AEROBIC AND ANAEROBIC Blood Culture adequate volume   Culture      NO GROWTH 5 DAYS Performed at [facility], [address] [place], [place] [address]    Report Status 05/01/2023 FINAL   Comprehensive metabolic panel   Collection Time: 04/26/23  3:00 PM  Result Value Ref Range   Sodium 132 (L) 135 - 145 mmol/L   Potassium 4.2 3.5 - 5.1 mmol/L   Chloride 98 98 - 111 mmol/L   CO2 24 22 - 32 mmol/L   Glucose, Bld 353 (H) 70 - 99 mg/dL   BUN 19 8 - 23 mg/dL   Creatinine, Ser 4.40 (H) 0.61 - 1.24 mg/dL   Calcium 8.4 (L) 8.9 - 10.3 mg/dL   Total Protein 6.7 6.5 - 8.1 g/dL   Albumin 3.9 3.5 - 5.0 g/dL   AST 24 15 - 41 U/L   ALT 22 0 - 44 U/L   Alkaline Phosphatase 96 38 - 126 U/L   Total Bilirubin 0.9 0.0 - 1.2 mg/dL   GFR, Estimated 54 (L) >60 mL/min   Anion gap 10 5 - 15  Protime-INR   Collection Time: 04/26/23  3:19 PM  Result Value Ref Range   Prothrombin Time 14.8 11.4 - 15.2 seconds   INR 1.1 0.8 - 1.2  APTT    Collection Time: 04/26/23  3:19 PM  Result Value Ref Range   aPTT 31 24 - 36 seconds  Lactic acid, plasma   Collection Time: 04/26/23  3:20 PM  Result Value Ref Range   Lactic Acid, Venous 2.2 (HH) 0.5 - 1.9 mmol/L  CBC with Differential   Collection Time: 04/26/23  3:20 PM  Result Value Ref Range   WBC 13.3 (H) 4.0 - 10.5 K/uL   RBC 3.42 (L) 4.22 - 5.81 MIL/uL   Hemoglobin 10.6 (L) 13.0 - 17.0 g/dL   HCT 34.7 (L) 42.5 - 95.6 %   MCV 93.6 80.0 - 100.0 fL   MCH 31.0 26.0 - 34.0 pg   MCHC 33.1 30.0 - 36.0 g/dL   RDW 38.7 56.4 - 33.2 %   Platelets 333 150 - 400 K/uL   nRBC 0.0 0.0 - 0.2 %   Neutrophils Relative % 88 %   Neutro Abs 11.7 (H) 1.7 - 7.7 K/uL   Lymphocytes Relative 4 %   Lymphs Abs 0.5 (L) 0.7 - 4.0 K/uL   Monocytes Relative 7 %   Monocytes Absolute 0.9 0.1 - 1.0 K/uL   Eosinophils Relative 0 %   Eosinophils Absolute 0.0 0.0 - 0.5 K/uL   Basophils Relative 0 %   Basophils Absolute 0.0 0.0 - 0.1 K/uL   Immature Granulocytes 1 %   Abs Immature Granulocytes 0.07 0.00 - 0.07 K/uL  Hemoglobin A1c   Collection Time: 04/26/23  3:20 PM  Result Value Ref Range   Hgb A1c MFr Bld 7.8 (H) 4.8 - 5.6 %   Mean Plasma Glucose 177.16 mg/dL  Resp panel by RT-PCR (RSV, Flu A&B, Covid) Anterior Nasal Swab   Collection Time: 04/26/23  4:43 PM   Specimen: Anterior Nasal Swab  Result Value Ref Range   SARS Coronavirus 2 by RT PCR NEGATIVE NEGATIVE   Influenza A by PCR NEGATIVE NEGATIVE   Influenza B by PCR NEGATIVE NEGATIVE   Resp Syncytial Virus by PCR NEGATIVE NEGATIVE  Respiratory (~20 pathogens) panel by PCR   Collection Time: 04/26/23  4:43 PM   Specimen: Nasopharyngeal Swab; Respiratory  Result Value Ref Range  Adenovirus NOT DETECTED NOT DETECTED   Coronavirus 229E NOT DETECTED NOT DETECTED   Coronavirus HKU1 NOT DETECTED NOT DETECTED   Coronavirus NL63 NOT DETECTED NOT DETECTED   Coronavirus OC43 NOT DETECTED NOT DETECTED   Metapneumovirus NOT DETECTED NOT DETECTED    Rhinovirus / Enterovirus DETECTED (A) NOT DETECTED   Influenza A NOT DETECTED NOT DETECTED   Influenza B NOT DETECTED NOT DETECTED   Parainfluenza Virus 1 NOT DETECTED NOT DETECTED   Parainfluenza Virus 2 NOT DETECTED NOT DETECTED   Parainfluenza Virus 3 NOT DETECTED NOT DETECTED   Parainfluenza Virus 4 NOT DETECTED NOT DETECTED   Respiratory Syncytial Virus NOT DETECTED NOT DETECTED   Bordetella pertussis NOT DETECTED NOT DETECTED   Bordetella Parapertussis NOT DETECTED NOT DETECTED   Chlamydophila pneumoniae NOT DETECTED NOT DETECTED   Mycoplasma pneumoniae NOT DETECTED NOT DETECTED  Lactic acid, plasma   Collection Time: 04/26/23  4:43 PM  Result Value Ref Range   Lactic Acid, Venous 2.0 (HH) 0.5 - 1.9 mmol/L  Ammonia   Collection Time: 04/26/23  6:48 PM  Result Value Ref Range   Ammonia 20 9 - 35 umol/L  CBG monitoring, ED   Collection Time: 04/26/23  9:02 PM  Result Value Ref Range   Glucose-Capillary 392 (H) 70 - 99 mg/dL  CBG monitoring, ED   Collection Time: 04/27/23  1:34 AM  Result Value Ref Range   Glucose-Capillary 115 (H) 70 - 99 mg/dL  CBC   Collection Time: 04/27/23  5:42 AM  Result Value Ref Range   WBC 10.4 4.0 - 10.5 K/uL   RBC 3.17 (L) 4.22 - 5.81 MIL/uL   Hemoglobin 10.0 (L) 13.0 - 17.0 g/dL   HCT 21.3 (L) 08.6 - 57.8 %   MCV 92.7 80.0 - 100.0 fL   MCH 31.5 26.0 - 34.0 pg   MCHC 34.0 30.0 - 36.0 g/dL   RDW 46.9 62.9 - 52.8 %   Platelets 273 150 - 400 K/uL   nRBC 0.0 0.0 - 0.2 %  Comprehensive metabolic panel   Collection Time: 04/27/23  5:42 AM  Result Value Ref Range   Sodium 136 135 - 145 mmol/L   Potassium 3.7 3.5 - 5.1 mmol/L   Chloride 102 98 - 111 mmol/L   CO2 24 22 - 32 mmol/L   Glucose, Bld 156 (H) 70 - 99 mg/dL   BUN 21 8 - 23 mg/dL   Creatinine, Ser 4.13 (H) 0.61 - 1.24 mg/dL   Calcium 8.4 (L) 8.9 - 10.3 mg/dL   Total Protein 6.3 (L) 6.5 - 8.1 g/dL   Albumin 3.6 3.5 - 5.0 g/dL   AST 27 15 - 41 U/L   ALT 23 0 - 44 U/L   Alkaline  Phosphatase 87 38 - 126 U/L   Total Bilirubin 0.7 0.0 - 1.2 mg/dL   GFR, Estimated 58 (L) >60 mL/min   Anion gap 10 5 - 15  CBG monitoring, ED   Collection Time: 04/27/23  8:01 AM  Result Value Ref Range   Glucose-Capillary 154 (H) 70 - 99 mg/dL  CBG monitoring, ED   Collection Time: 04/27/23 12:08 PM  Result Value Ref Range   Glucose-Capillary 82 70 - 99 mg/dL  ECHOCARDIOGRAM COMPLETE   Collection Time: 04/27/23  1:40 PM  Result Value Ref Range   Weight 2,080 oz   Height 68 in   BP 139/119 mmHg   Ao pk vel 1.06 m/s   AV Area VTI 1.96 cm2  AR max vel 2.19 cm2   AV Mean grad 2.0 mmHg   AV Peak grad 4.5 mmHg   S' Lateral 2.40 cm   AV Area mean vel 2.12 cm2   Area-P 1/2 4.33 cm2   Est EF 55 - 60%   CBG monitoring, ED   Collection Time: 04/27/23  4:38 PM  Result Value Ref Range   Glucose-Capillary 218 (H) 70 - 99 mg/dL  CBG monitoring, ED   Collection Time: 04/27/23  8:20 PM  Result Value Ref Range   Glucose-Capillary 109 (H) 70 - 99 mg/dL  CBG monitoring, ED   Collection Time: 04/28/23  3:47 AM  Result Value Ref Range   Glucose-Capillary 99 70 - 99 mg/dL  CBG monitoring, ED   Collection Time: 04/28/23  8:09 AM  Result Value Ref Range   Glucose-Capillary 89 70 - 99 mg/dL   {Labs (DGUYQIHK):74259}    Assessment & Plan:   Problem List Items Addressed This Visit   None    Assessment and Plan Assessment & Plan         Follow up plan: No follow-ups on file.

## 2023-05-14 ENCOUNTER — Ambulatory Visit: Admitting: Nurse Practitioner

## 2023-05-14 ENCOUNTER — Encounter: Payer: Self-pay | Admitting: Nurse Practitioner

## 2023-05-14 VITALS — BP 114/64 | HR 78 | Temp 98.0°F | Resp 14 | Ht 68.0 in | Wt 135.3 lb

## 2023-05-14 DIAGNOSIS — Z09 Encounter for follow-up examination after completed treatment for conditions other than malignant neoplasm: Secondary | ICD-10-CM

## 2023-05-14 DIAGNOSIS — I10 Essential (primary) hypertension: Secondary | ICD-10-CM | POA: Diagnosis not present

## 2023-05-14 DIAGNOSIS — I4891 Unspecified atrial fibrillation: Secondary | ICD-10-CM | POA: Diagnosis not present

## 2023-05-14 DIAGNOSIS — R6510 Systemic inflammatory response syndrome (SIRS) of non-infectious origin without acute organ dysfunction: Secondary | ICD-10-CM

## 2023-05-26 ENCOUNTER — Inpatient Hospital Stay

## 2023-05-27 ENCOUNTER — Inpatient Hospital Stay: Attending: Oncology

## 2023-05-27 DIAGNOSIS — D649 Anemia, unspecified: Secondary | ICD-10-CM | POA: Diagnosis present

## 2023-05-27 LAB — CBC WITH DIFFERENTIAL/PLATELET
Abs Immature Granulocytes: 0.05 10*3/uL (ref 0.00–0.07)
Basophils Absolute: 0 10*3/uL (ref 0.0–0.1)
Basophils Relative: 1 %
Eosinophils Absolute: 0.1 10*3/uL (ref 0.0–0.5)
Eosinophils Relative: 2 %
HCT: 35.3 % — ABNORMAL LOW (ref 39.0–52.0)
Hemoglobin: 11.1 g/dL — ABNORMAL LOW (ref 13.0–17.0)
Immature Granulocytes: 1 %
Lymphocytes Relative: 20 %
Lymphs Abs: 1.3 10*3/uL (ref 0.7–4.0)
MCH: 30.3 pg (ref 26.0–34.0)
MCHC: 31.4 g/dL (ref 30.0–36.0)
MCV: 96.4 fL (ref 80.0–100.0)
Monocytes Absolute: 0.5 10*3/uL (ref 0.1–1.0)
Monocytes Relative: 8 %
Neutro Abs: 4.4 10*3/uL (ref 1.7–7.7)
Neutrophils Relative %: 68 %
Platelets: 369 10*3/uL (ref 150–400)
RBC: 3.66 MIL/uL — ABNORMAL LOW (ref 4.22–5.81)
RDW: 13.9 % (ref 11.5–15.5)
WBC: 6.5 10*3/uL (ref 4.0–10.5)
nRBC: 0 % (ref 0.0–0.2)

## 2023-07-06 ENCOUNTER — Ambulatory Visit (INDEPENDENT_AMBULATORY_CARE_PROVIDER_SITE_OTHER): Payer: Medicare Other | Admitting: Nurse Practitioner

## 2023-07-06 ENCOUNTER — Encounter: Payer: Self-pay | Admitting: Nurse Practitioner

## 2023-07-06 VITALS — BP 132/72 | HR 55 | Temp 97.9°F | Ht 68.0 in | Wt 138.6 lb

## 2023-07-06 DIAGNOSIS — I700 Atherosclerosis of aorta: Secondary | ICD-10-CM | POA: Diagnosis not present

## 2023-07-06 DIAGNOSIS — I4811 Longstanding persistent atrial fibrillation: Secondary | ICD-10-CM

## 2023-07-06 DIAGNOSIS — E782 Mixed hyperlipidemia: Secondary | ICD-10-CM

## 2023-07-06 DIAGNOSIS — K219 Gastro-esophageal reflux disease without esophagitis: Secondary | ICD-10-CM

## 2023-07-06 DIAGNOSIS — Z794 Long term (current) use of insulin: Secondary | ICD-10-CM

## 2023-07-06 DIAGNOSIS — E1165 Type 2 diabetes mellitus with hyperglycemia: Secondary | ICD-10-CM

## 2023-07-06 DIAGNOSIS — R351 Nocturia: Secondary | ICD-10-CM

## 2023-07-06 DIAGNOSIS — I4819 Other persistent atrial fibrillation: Secondary | ICD-10-CM

## 2023-07-06 DIAGNOSIS — I2510 Atherosclerotic heart disease of native coronary artery without angina pectoris: Secondary | ICD-10-CM

## 2023-07-06 DIAGNOSIS — N401 Enlarged prostate with lower urinary tract symptoms: Secondary | ICD-10-CM

## 2023-07-06 DIAGNOSIS — I10 Essential (primary) hypertension: Secondary | ICD-10-CM

## 2023-07-06 DIAGNOSIS — D473 Essential (hemorrhagic) thrombocythemia: Secondary | ICD-10-CM

## 2023-07-06 LAB — COMPREHENSIVE METABOLIC PANEL WITH GFR
AG Ratio: 1.9 (calc) (ref 1.0–2.5)
ALT: 21 U/L (ref 9–46)
AST: 21 U/L (ref 10–35)
Albumin: 4.5 g/dL (ref 3.6–5.1)
Alkaline phosphatase (APISO): 115 U/L (ref 35–144)
BUN/Creatinine Ratio: 14 (calc) (ref 6–22)
BUN: 21 mg/dL (ref 7–25)
CO2: 27 mmol/L (ref 20–32)
Calcium: 9.7 mg/dL (ref 8.6–10.3)
Chloride: 101 mmol/L (ref 98–110)
Creat: 1.51 mg/dL — ABNORMAL HIGH (ref 0.70–1.28)
Globulin: 2.4 g/dL (ref 1.9–3.7)
Glucose, Bld: 232 mg/dL — ABNORMAL HIGH (ref 65–99)
Potassium: 4.6 mmol/L (ref 3.5–5.3)
Sodium: 136 mmol/L (ref 135–146)
Total Bilirubin: 0.4 mg/dL (ref 0.2–1.2)
Total Protein: 6.9 g/dL (ref 6.1–8.1)
eGFR: 47 mL/min/{1.73_m2} — ABNORMAL LOW (ref >=60)

## 2023-07-06 LAB — CBC WITH DIFFERENTIAL/PLATELET
Absolute Lymphocytes: 1626 {cells}/uL (ref 850–3900)
Absolute Monocytes: 653 {cells}/uL (ref 200–950)
Basophils Absolute: 50 {cells}/uL (ref 0–200)
Basophils Relative: 0.7 %
Eosinophils Absolute: 92 {cells}/uL (ref 15–500)
Eosinophils Relative: 1.3 %
HCT: 35.8 % — ABNORMAL LOW (ref 38.5–50.0)
Hemoglobin: 11.6 g/dL — ABNORMAL LOW (ref 13.2–17.1)
MCH: 30.6 pg (ref 27.0–33.0)
MCHC: 32.4 g/dL (ref 32.0–36.0)
MCV: 94.5 fL (ref 80.0–100.0)
MPV: 9 fL (ref 7.5–12.5)
Monocytes Relative: 9.2 %
Neutro Abs: 4679 {cells}/uL (ref 1500–7800)
Neutrophils Relative %: 65.9 %
Platelets: 476 10*3/uL — ABNORMAL HIGH (ref 140–400)
RBC: 3.79 10*6/uL — ABNORMAL LOW (ref 4.20–5.80)
RDW: 12.3 % (ref 11.0–15.0)
Total Lymphocyte: 22.9 %
WBC: 7.1 10*3/uL (ref 3.8–10.8)

## 2023-07-06 LAB — LIPID PANEL
Cholesterol: 136 mg/dL (ref <200)
HDL: 60 mg/dL (ref >=40)
LDL Cholesterol (Calc): 55 mg/dL
Non-HDL Cholesterol (Calc): 76 mg/dL (ref <130)
Total CHOL/HDL Ratio: 2.3 (calc) (ref <5.0)
Triglycerides: 124 mg/dL (ref <150)

## 2023-07-06 NOTE — Progress Notes (Signed)
 BP 132/72 (BP Location: Right Arm, Patient Position: Sitting, Cuff Size: Normal)   Pulse (!) 55   Temp 97.9 F (36.6 C) (Oral)   Ht 5\' 8"  (1.727 m)   Wt 138 lb 9.6 oz (62.9 kg)   SpO2 100%   BMI 21.07 kg/m    Subjective:    Patient ID: Garrett Cable., male    DOB: 1946/01/10, 78 y.o.   MRN: [id number]  HPI: Garrett Luczak. is a 78 y.o. male  Chief Complaint  Patient presents with   Medical managment     Discussed the use of AI scribe software for clinical note transcription with the patient, who gave verbal consent to proceed.  History of Present Illness Garrett Yoder. "Josiah Nigh" is a 78 year old male with hypertension, coronary artery disease, Garrett type two diabetes who presents for routine follow-up.  Blood sugar levels are well-controlled, described as 'probably been the best it's ever been.' He is on  Lantus  25 units at night, Humalog 6 units in the morning Garrett 10 units before lunch, Garrett metformin  1000 mg twice a day. He also uses a Humalog sliding scale.  Hypertension is monitored daily, with a recent reading of 132/72 mmHg. He is currently taking metoprolol  100 mg daily Garrett states his blood pressure is generally 'pretty good.'  He has a history of coronary artery disease Garrett atrial fibrillation. He is on Eliquis  5 mg twice a day Garrett digoxin  0.0625 mg daily. No new cardiac symptoms.  Chronic kidney disease Garrett thrombocytopenia are part of his medical history, but he has not seen hematology recently.   He is on atorvastatin  40 mg daily for hyperlipidemia Garrett reports no issues with this medication.  He has benign prostatic hyperplasia (BPH) Garrett is taking Flomax  0.4 mg daily. Proscar  has been discontinued from his medication list.  He recently celebrated a birthday Garrett is approaching 78 years old, expressing a positive outlook on aging.         07/06/2023    1:08 PM 05/14/2023    1:24 PM 01/05/2023    1:36 PM  Depression screen PHQ 2/9  Decreased Interest 0  0 0  Down, Depressed, Hopeless 0 0 0  PHQ - 2 Score 0 0 0  Altered sleeping 0 0   Tired, decreased energy 0 0   Change in appetite 0 0   Feeling bad or failure about yourself  0 0   Trouble concentrating 0 0   Suicidal thoughts 0 0   PHQ-9 Score 0 0   Difficult doing work/chores Not difficult at all Not difficult at all     Relevant past medical, surgical, family Garrett social history reviewed Garrett updated as indicated. Interim medical history since our last visit reviewed. Allergies Garrett medications reviewed Garrett updated.  Review of Systems  Constitutional: Negative for fever or weight change.  Respiratory: Negative for cough Garrett shortness of breath.   Cardiovascular: Negative for chest pain or palpitations.  Gastrointestinal: Negative for abdominal pain, no bowel changes.  Musculoskeletal: Negative for gait problem or joint swelling.  Skin: Negative for rash.  Neurological: Negative for dizziness or headache.  No other specific complaints in a complete review of systems (except as listed in HPI above).      Objective:      BP 132/72 (BP Location: Right Arm, Patient Position: Sitting, Cuff Size: Normal)   Pulse (!) 55   Temp 97.9 F (36.6 C) (Oral)   Ht 5\' 8"  (  1.727 m)   Wt 138 lb 9.6 oz (62.9 kg)   SpO2 100%   BMI 21.07 kg/m    Wt Readings from Last 3 Encounters:  07/06/23 138 lb 9.6 oz (62.9 kg)  05/14/23 135 lb 4.8 oz (61.4 kg)  05/12/23 133 lb 9.6 oz (60.6 kg)    Physical Exam Vitals reviewed.  Constitutional:      Appearance: Normal appearance.  HENT:     Head: Normocephalic.  Cardiovascular:     Rate Garrett Rhythm: Normal rate Garrett regular rhythm.  Pulmonary:     Effort: Pulmonary effort is normal.     Breath sounds: Normal breath sounds.  Musculoskeletal:        General: Normal range of motion.  Skin:    General: Skin is warm Garrett dry.  Neurological:     General: No focal deficit present.     Mental Status: He is alert Garrett oriented to person, Garrett Yoder, Garrett  time. Mental status is at baseline.  Psychiatric:        Mood Garrett Affect: Mood normal.        Behavior: Behavior normal.        Thought Content: Thought content normal.        Judgment: Judgment normal.    Physical Exam    Diabetic Foot Exam - Simple   Simple Foot Form Diabetic Foot exam was performed with the following findings: Yes 07/06/2023  1:17 PM  Visual Inspection No deformities, no ulcerations, no other skin breakdown bilaterally: Yes Sensation Testing Intact to touch Garrett monofilament testing bilaterally: Yes Pulse Check Posterior Tibialis Garrett Dorsalis pulse intact bilaterally: Yes Comments     Results for orders placed or performed in visit on 05/27/23  CBC with Differential/Platelet   Collection Time: 05/27/23 10:14 AM  Result Value Ref Range   WBC 6.5 4.0 - 10.5 K/uL   RBC 3.66 (L) 4.22 - 5.81 MIL/uL   Hemoglobin 11.1 (L) 13.0 - 17.0 g/dL   HCT 95.1 (L) 88.4 - 16.6 %   MCV 96.4 80.0 - 100.0 fL   MCH 30.3 26.0 - 34.0 pg   MCHC 31.4 30.0 - 36.0 g/dL   RDW 06.3 01.6 - 01.0 %   Platelets 369 150 - 400 K/uL   nRBC 0.0 0.0 - 0.2 %   Neutrophils Relative % 68 %   Neutro Abs 4.4 1.7 - 7.7 K/uL   Lymphocytes Relative 20 %   Lymphs Abs 1.3 0.7 - 4.0 K/uL   Monocytes Relative 8 %   Monocytes Absolute 0.5 0.1 - 1.0 K/uL   Eosinophils Relative 2 %   Eosinophils Absolute 0.1 0.0 - 0.5 K/uL   Basophils Relative 1 %   Basophils Absolute 0.0 0.0 - 0.1 K/uL   Immature Granulocytes 1 %   Abs Immature Granulocytes 0.05 0.00 - 0.07 K/uL          Assessment & Plan:   Problem List Items Addressed This Visit       Cardiovascular Garrett Mediastinum   Essential hypertension, benign   Relevant Orders   CBC with Differential/Platelet   Comprehensive metabolic panel with GFR   Aortic atherosclerosis (HCC)   Relevant Orders   Lipid panel   Coronary artery calcification seen on CAT scan   Relevant Orders   Lipid panel   Persistent atrial fibrillation (HCC)   RESOLVED:  Afib (HCC) - Primary     Digestive   Acid reflux     Endocrine   Type 2 diabetes  mellitus with hyperglycemia, with long-term current use of insulin  (HCC)   Relevant Orders   HM Diabetes Foot Exam (Completed)   Comprehensive metabolic panel with GFR   Microalbumin / creatinine urine ratio     Hematopoietic Garrett Hemostatic   Essential (hemorrhagic) thrombocythemia (HCC)     Other   Hyperlipidemia   Relevant Orders   Lipid panel   Benign prostatic hyperplasia with nocturia     Assessment Garrett Plan Assessment & Plan Type 2 diabetes mellitus Type 2 diabetes mellitus is well-controlled with optimal blood glucose levels. Current regimen includes Lantus  25 units at night, Humalog 6 units in the morning Garrett 10 units before lunch, metformin  1000 mg twice daily, Garrett Actos 15 mg daily. - Continue current diabetes management regimen as prescribed by endocrinology.  Hypertension Hypertension is well-controlled with a recent blood pressure reading of 132/72 mmHg. He self-monitors blood pressure daily.  Benign prostatic hyperplasia Proscar  has been discontinued as per urology's recommendation.  hyperlipidemia /aortic atherosclerosis atorvastatin  40 mg daily     Atrial fibrillation/CAD -managed by cardiology He is on Eliquis  5 mg twice a day Garrett digoxin  0.0625 mg daily.   Follow up plan: Return in about 6 months (around 01/06/2024) for follow up.

## 2023-07-07 ENCOUNTER — Ambulatory Visit: Payer: Self-pay | Admitting: Nurse Practitioner

## 2023-07-21 ENCOUNTER — Inpatient Hospital Stay: Payer: Medicare Other | Admitting: Oncology

## 2023-07-21 ENCOUNTER — Inpatient Hospital Stay: Payer: Medicare Other

## 2023-07-28 ENCOUNTER — Other Ambulatory Visit: Payer: Self-pay

## 2023-07-28 DIAGNOSIS — D649 Anemia, unspecified: Secondary | ICD-10-CM

## 2023-07-29 ENCOUNTER — Inpatient Hospital Stay: Attending: Oncology

## 2023-07-29 ENCOUNTER — Encounter: Payer: Self-pay | Admitting: Oncology

## 2023-07-29 ENCOUNTER — Inpatient Hospital Stay: Admitting: Oncology

## 2023-07-29 VITALS — BP 147/82 | HR 73 | Temp 97.0°F | Resp 16

## 2023-07-29 DIAGNOSIS — D75839 Thrombocytosis, unspecified: Secondary | ICD-10-CM | POA: Insufficient documentation

## 2023-07-29 DIAGNOSIS — D649 Anemia, unspecified: Secondary | ICD-10-CM | POA: Diagnosis not present

## 2023-07-29 LAB — CBC WITH DIFFERENTIAL (CANCER CENTER ONLY)
Abs Immature Granulocytes: 0.03 10*3/uL (ref 0.00–0.07)
Basophils Absolute: 0.1 10*3/uL (ref 0.0–0.1)
Basophils Relative: 1 %
Eosinophils Absolute: 0 10*3/uL (ref 0.0–0.5)
Eosinophils Relative: 0 %
HCT: 35.2 % — ABNORMAL LOW (ref 39.0–52.0)
Hemoglobin: 11.3 g/dL — ABNORMAL LOW (ref 13.0–17.0)
Immature Granulocytes: 0 %
Lymphocytes Relative: 13 %
Lymphs Abs: 1.1 10*3/uL (ref 0.7–4.0)
MCH: 30.2 pg (ref 26.0–34.0)
MCHC: 32.1 g/dL (ref 30.0–36.0)
MCV: 94.1 fL (ref 80.0–100.0)
Monocytes Absolute: 0.5 10*3/uL (ref 0.1–1.0)
Monocytes Relative: 6 %
Neutro Abs: 6.5 10*3/uL (ref 1.7–7.7)
Neutrophils Relative %: 80 %
Platelet Count: 419 10*3/uL — ABNORMAL HIGH (ref 150–400)
RBC: 3.74 MIL/uL — ABNORMAL LOW (ref 4.22–5.81)
RDW: 13 % (ref 11.5–15.5)
WBC Count: 8.2 10*3/uL (ref 4.0–10.5)
nRBC: 0 % (ref 0.0–0.2)

## 2023-07-29 NOTE — Progress Notes (Signed)
 Hematology/Oncology Consult note [facility]  [phone] [phone]  Patient Care Team: Quinton Buckler, FNP as PCP - General (Nurse Practitioner) Wenona Hamilton, MD as PCP - Cardiology (Cardiology) Boyce Byes, MD as PCP - Electrophysiology (Cardiology) Lorelei Rogers Nicolas Barren, MD as Physician Assistant (Endocrinology) Avonne Boettcher, MD as Consulting Physician (Oncology) Geraline Knapp, MD (Urology) Clair Crews, MD as Referring Physician (Ophthalmology)   Name of the patient: Garrett Yoder  [id number]  [birth year]   Date of visit: 07/29/23  Diagnosis- . 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 Anemia likely due to chronic disease   Chief complaint/ Reason for visit-routine follow-up of anemia and thrombocytosis  Heme/Onc history:  patient is a 78 year old Caucasian male with a past medical history significant for diabetes, GERD, hypertension hyperlipidemia anxiety among other medical problems he has been referred to us  for thrombocytosis. Of note patient has always had thrombocytosis even dating back to 2013 when his platelet count was 484. Since then his platelet count had been ranging between 400's to 500s. In July 2018 his platelet count was 522 and then gradually rose to 566 seconds 728 and then 748 recently. White count has always been normal. He is also had a long-standing normocytic anemia and his hemoglobin ranges around 11. Most recent CBC from 11/14/2016 showed white count of 6.5, H&H of 10.2/31.7 with an MCV of 95 and a platelet count of 748. Iron studies show normal ferritin of 112 and iron saturation of 23% and TIBC was normal at 300    Results of blood work from 11/25/2016 were as follows: CBC showed white count of 5.6, H&H of 11/32.8 and a platelet count of 542. B12 was normal at 257 and folate was normal. Haptoglobin levels were normal. Reticulocyte count was mildly low at 0.9%. Jak 2 And MPL Were Negative. ESR Was Normal.  Peripheral Smear Review Showed Normal Morphology of WBCs RBCs and Platelets. CMP Was within Normal Limits. BCR Abl Testing Was Negative. Multiple Myeloma Panel Revealed No M Protein with Immunofixation Revealed Free Lambda Light Chains   Bone marrow biopsy on 03/17/2017 showed normocellular bone marrow with trilineage hematopoiesis.  Numerous lymphoid aggregates and normocytic normochromic anemia.  Bone marrow did not show any morphologic features of myeloproliferative or myelodysplastic neoplasm.  He did have numerous lymphoid aggregates.  Flow cytometry analysis showed a minor lymphoid population of B cells (22%) which were less than 5% of all cells and were coexpressing CD5 and CD20.  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  Cytogenetics and FISH studies were normal.  Plasma cells represented 2% of the cells and showed polyclonal staining pattern for kappa and lambda light chains and no evidence of plasma cell neoplasm in the bone marrow      Interval history-patient feels well presently and denies any specific complaints at this time  ECOG PS- 1 Pain scale- 0   Review of systems- Review of Systems  Constitutional:  Negative for chills, fever, malaise/fatigue and weight loss.  HENT:  Negative for congestion, ear discharge and nosebleeds.   Eyes:  Negative for blurred vision.  Respiratory:  Negative for cough, hemoptysis, sputum production, shortness of breath and wheezing.   Cardiovascular:  Negative for chest pain, palpitations, orthopnea and claudication.  Gastrointestinal:  Negative for abdominal pain, blood in stool, constipation, diarrhea, heartburn, melena, nausea and vomiting.  Genitourinary:  Negative for dysuria, flank pain, frequency, hematuria and urgency.  Musculoskeletal:  Negative for back pain, joint pain and  myalgias.  Skin:  Negative for rash.  Neurological:  Negative for dizziness, tingling, focal weakness, seizures, weakness and  headaches.  Endo/Heme/Allergies:  Does not bruise/bleed easily.  Psychiatric/Behavioral:  Negative for depression and suicidal ideas. The patient does not have insomnia.       Allergies  Allergen Reactions   Codeine Nausea Only    Other reaction(s): makes him sick to his stomach Other reaction(s): makes him sick to his stomach     Past Medical History:  Diagnosis Date   Anemia 08/14/2015   Anxiety    Aortic atherosclerosis (HCC) 11/25/2016   Chest CT Oct 2018   Aorto-iliac atherosclerosis (HCC) 11/25/2016   Abd/pelvic CT Oct 2018   Coronary artery calcification seen on CAT scan 11/25/2016   Chest CT Oct 2018; refer to Dr. 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 01/2022 Echo: EF 50-55%, no rwma, nl RV fxn w/ RVSP 34.55mmHg, Mildly dil RA. Mild MR. Mod TR. AoV sclerosis w/o stenosis.   History of stress test    a. 01/2017 Ex MV: Poor ex capacity - held in stage I. 1mm Inflat ST depression. No ischemia/infarct. EF 55-65%.   Hyperlipidemia    Hypertension    Hypertrophy of prostate with urinary obstruction 11/25/2016   Pelvic CT October 2018   Incidental lung nodule, > 3mm and < 8mm 11/25/2016   5 mm nodule LLL, chest CT Oct 2018   Persistent atrial fibrillation (HCC)    a. 02/2022 Dx in 01/2022-->CHA2DS2VASc = 5-->Eliquis ; b. 02/2022 s/p DCCV (150J->200J->RSR).   Thrombocytosis 08/14/2015     Past Surgical History:  Procedure Laterality Date   ANTERIOR CERVICAL DECOMP/DISCECTOMY FUSION N/A 09/24/2016   Procedure: ANTERIOR CERVICAL DECOMPRESSION/DISCECTOMY FUSION 3 LEVELS-C4-7;  Surgeon: Jodeen Munch, MD;  Location: [facility] ORS;  Service: Neurosurgery;  Laterality: N/A;   CARDIOVERSION N/A 02/27/2022   Procedure: CARDIOVERSION;  Surgeon: Devorah Fonder, MD;  Location: [facility] OR[facility];  Service: Cardiovascular;  Laterality: N/A;   CATARACT EXTRACTION     ELECTROMAGNETIC NAVIGATION BROCHOSCOPY Right 04/07/2018   Procedure: ELECTROMAGNETIC NAVIGATION BRONCHOSCOPY;  Surgeon: Cleve Dale, MD;  Location: [facility] ORS;  Service: Cardiopulmonary;  Laterality: Right;   EYE SURGERY Right    Cataract Extraction with IOL   RETINAL LASER PROCEDURE      Social History   Socioeconomic History   Marital status: Widowed    Spouse name: Marily Shows   Number of children: Not on file   Years of education: Not on file   Highest education level: Not on file  Occupational History   Occupation: retired  Tobacco Use   Smoking status: Former    Current packs/day: 0.00    Average packs/day: 1 pack/day for 30.0 years (30.0 ttl pk-yrs)    Types: Cigarettes    Start date: 07/18/1969    Quit date: 07/19/1999    Years since quitting: 24.0   Smokeless tobacco: Never  Vaping Use   Vaping status: Never Used  Substance and Sexual Activity   Alcohol use: No    Alcohol/week: 0.0 standard drinks of alcohol   Drug use: No   Sexual activity: Yes    Partners: Female  Other Topics Concern   Not on file  Social History Narrative   Not on  file   Social Drivers of Health   Financial Resource Strain: Low Risk  (10/10/2022)   Overall Financial Resource Strain (CARDIA)    Difficulty of Paying Living Expenses: Not hard at all  Food Insecurity: No Food Insecurity (04/28/2023)   Hunger Vital Sign    Worried About Running Out of Food in the Last Year: Never true    Ran Out of Food in the Last Year: Never true  Transportation Needs: No Transportation Needs (04/28/2023)   PRAPARE - Administrator, Civil Service (Medical): No    Lack of Transportation (Non-Medical): No  Physical Activity: Sufficiently Active (10/10/2022)   Exercise Vital Sign    Days of Exercise per Week: 5 days    Minutes of Exercise per Session: 60 min  Stress: No Stress Concern Present (10/10/2022)    [employer] of Occupational Health - Occupational Stress Questionnaire    Feeling of Stress : Not at all  Social Connections: Moderately Isolated (04/28/2023)   Social Connection and Isolation Panel [NHANES]    Frequency of Communication with Friends and Family: Twice a week    Frequency of Social Gatherings with Friends and Family: Twice a week    Attends Religious Services: More than 4 times per year    Active Member of [employer] or Organizations: No    Attends Banker Meetings: Never    Marital Status: Widowed  Intimate Partner Violence: Not At Risk (04/28/2023)   Humiliation, Afraid, Rape, and Kick questionnaire    Fear of Current or Ex-Partner: No    Emotionally Abused: No    Physically Abused: No    Sexually Abused: No    Family History  Problem Relation Age of Onset   Stroke Mother    Diabetes Mellitus II Father    Heart Problems Father    Heart disease Father    Diabetes Sister    Cancer Maternal Grandfather    Stroke Paternal Grandmother    Prostate cancer Neg Hx    Bladder Cancer Neg Hx    Kidney cancer Neg Hx      Current Outpatient Medications:    apixaban  (ELIQUIS ) 5 MG TABS tablet, TAKE 1 TABLET BY MOUTH TWICE  DAILY, Disp: 200 tablet, Rfl: 1   atorvastatin  (LIPITOR) 40 MG tablet, TAKE 1 TABLET BY MOUTH DAILY, Disp: 100 tablet, Rfl: 2   bimatoprost (LUMIGAN) 0.01 % SOLN, Place 1 drop into both eyes at bedtime. , Disp: , Rfl:    digoxin  (LANOXIN ) 0.125 MG tablet, Take 0.5 tablets (0.0625 mg total) by mouth daily., Disp: 45 tablet, Rfl: 3   insulin  detemir (LEVEMIR  FLEXTOUCH) 100 UNIT/ML FlexPen, Inject 30 Units into the skin every evening., Disp: , Rfl:    insulin  lispro (HUMALOG) 100 UNIT/ML KwikPen, Inject into the skin daily. Pt taking 4 units in AM, 12 units at noon, Disp: , Rfl:    metFORMIN  (GLUCOPHAGE ) 1000 MG tablet, Take 1,000 mg by mouth 2 (two) times daily with a meal., Disp: , Rfl:    metoprolol  succinate (TOPROL -XL) 100 MG 24 hr  tablet, TAKE 1 TABLET BY MOUTH  DAILY . TAKE WITH OR IMMEDIATELY FOLLOWING A MEAL, Disp: 180 tablet, Rfl: 3   Multiple Vitamin (MULTI-VITAMINS) TABS, Take 1 tablet by mouth 2 (two) times daily. , Disp: , Rfl:    pioglitazone (ACTOS) 15 MG tablet, Take 15 mg by mouth daily., Disp: , Rfl:    SIMBRINZA 1-0.2 % SUSP, Place 1 drop into both eyes 3 (three) times daily. ,  Disp: , Rfl:    tamsulosin  (FLOMAX ) 0.4 MG CAPS capsule, TAKE 1 CAPSULE BY MOUTH DAILY, Disp: 60 capsule, Rfl: 5  Physical exam: There were no vitals filed for this visit. Physical Exam Cardiovascular:     Rate and Rhythm: Normal rate and regular rhythm.     Heart sounds: Normal heart sounds.  Pulmonary:     Effort: Pulmonary effort is normal.     Breath sounds: Normal breath sounds.  Abdominal:     General: Bowel sounds are normal.     Palpations: Abdomen is soft.  Skin:    General: Skin is warm and dry.  Neurological:     Mental Status: He is alert and oriented to person, place, and time.      I have personally reviewed labs listed below:    Latest Ref Rng & Units 07/06/2023    1:31 PM  CMP  Glucose 65 - 99 mg/dL 161   BUN 7 - 25 mg/dL 21   Creatinine 0.96 - 1.28 mg/dL 0.45   Sodium 409 - 811 mmol/L 136   Potassium 3.5 - 5.3 mmol/L 4.6   Chloride 98 - 110 mmol/L 101   CO2 20 - 32 mmol/L 27   Calcium  8.6 - 10.3 mg/dL 9.7   Total Protein 6.1 - 8.1 g/dL 6.9   Total Bilirubin 0.2 - 1.2 mg/dL 0.4   AST 10 - 35 U/L 21   ALT 9 - 46 U/L 21       Latest Ref Rng & Units 07/29/2023    9:25 AM  CBC  WBC 4.0 - 10.5 K/uL 8.2   Hemoglobin 13.0 - 17.0 g/dL 91.4   Hematocrit 78.2 - 52.0 % 35.2   Platelets 150 - 400 K/uL 419       Assessment and plan- Patient is a 78 y.o. male here for routine follow-up of anemia and thrombocytosis   Thrombocytosis: Waxes and wanes and presently at 419.  No episodes of thromboembolic events.  Workup for primary myeloproliferative disorder was negative.  Continue to  monitor  Normocytic anemia: Hemoglobin Is presently at his baseline close to 11.  He likely has some degree of anemia of chronic disease and given the stability of his anemia we do not have to pursue any further workup at this time.  He has had a bone marrow biopsy in the past as well.  CBC ferritin and iron studies in 6 months in 1 year and I will see him back in 1 year    Visit Diagnosis 1. Normocytic anemia   2. Thrombocytosis      Dr. Seretha Dance, MD, MPH [facility] at [facility] [phone] 07/29/2023 9:47 AM

## 2023-07-30 ENCOUNTER — Other Ambulatory Visit: Payer: Self-pay | Admitting: Cardiovascular Disease

## 2023-07-30 DIAGNOSIS — I4821 Permanent atrial fibrillation: Secondary | ICD-10-CM

## 2023-07-30 NOTE — Telephone Encounter (Signed)
 Prescription refill request for Eliquis  received. Indication: a fib Last office visit: 05/12/23 Scr: 1.51 epic 07/06/23 Age: 79 Weight: 62.9kg

## 2023-08-03 ENCOUNTER — Other Ambulatory Visit: Payer: Self-pay | Admitting: Cardiology

## 2023-09-10 NOTE — Progress Notes (Unsigned)
 Electrophysiology Clinic Note    Date:  09/11/2023  Patient ID:  Garrett Gatley., DOB [birth year], MRN [id number] PCP:  [name], FNP  Cardiologist:  Deatrice Cage, MD  Electrophysiologist:  OLE ONEIDA HOLTS, MD  Electrophysiology APP:  Thaddus Mcdowell, NP   Discussed the use of AI scribe software for clinical note transcription with the patient, who gave verbal consent to proceed.   Patient Profile    Chief Complaint: AFib follow-up  History of Present Illness: Garrett Manning. is a 78 y.o. male with PMH notable for perm AFib, CAD, HTN; seen today for OLE ONEIDA HOLTS, MD for routine electrophysiology followup.   I last saw him 04/2023 after hospitalization for SIRS (positive for rhinovirus) where he had afib w RVR. He had recovered and was feeling well, working on getting back to normal activity.   On follow-up today, he is doing well and returned to his usual activities. He checks his pulse and BP regularly at home, pulse is 70-low 100s. He denies chest pain, chest pressure, palpitations, or SOB.  He continues to take eliquis  BID without bleeding concerns.     Arrhythmia/Device History No specialty comments available.    ROS:  Please see the history of present illness. All other systems are reviewed and otherwise negative.    Physical Exam    VS:  BP 128/78 (BP Location: Left Arm, Patient Position: Sitting, Cuff Size: Normal)   Pulse 95   Ht 5' 8 (1.727 m)   Wt 134 lb 12.8 oz (61.1 kg)   SpO2 98%   BMI 20.50 kg/m  BMI: Body mass index is 20.5 kg/m.      Wt Readings from Last 3 Encounters:  09/11/23 134 lb 12.8 oz (61.1 kg)  07/06/23 138 lb 9.6 oz (62.9 kg)  05/14/23 135 lb 4.8 oz (61.4 kg)     GEN- The patient is well appearing, alert and oriented x 3 today.   Lungs- Clear to ausculation bilaterally, normal work of breathing.  Heart- Irregularly irregular rate and rhythm, no murmurs, rubs or gallops Extremities- No peripheral edema,  warm, dry    Studies Reviewed   Previous EP, cardiology notes.    EKG is ordered. Personal review of EKG from today shows:    EKG Interpretation Date/Time:  Friday September 11 2023 11:08:26 EDT Ventricular Rate:  95 PR Interval:    QRS Duration:  68 QT Interval:  356 QTC Calculation: 447 R Axis:   81  Text Interpretation: Atrial fibrillation Confirmed by Kosei Rhodes [phone]) on 09/11/2023 11:12:34 AM    TTE, 04/27/2023  1. Left ventricular ejection fraction, by estimation, is 55 to 60%. The left ventricle has normal function. The left ventricle has no regional wall motion abnormalities. Left ventricular diastolic parameters are indeterminate.   2. Right ventricular systolic function is normal. The right ventricular size is normal. There is normal pulmonary artery systolic pressure.   3. Left atrial size was mildly dilated.   4. The mitral valve is normal in structure. Mild mitral valve regurgitation. No evidence of mitral stenosis.   5. The aortic valve is normal in structure. Aortic valve regurgitation is not visualized. No aortic stenosis is present.   6. The inferior vena cava is dilated in size with >50% respiratory variability, suggesting right atrial pressure of 8 mmHg.      TTE, 01/30/2022  1. Left ventricular ejection fraction, by estimation, is 50 to 55%. The left ventricle has low normal function.  The left ventricle has no regional wall motion abnormalities. Left ventricular diastolic parameters are indeterminate.   2. Right ventricular systolic function is normal. The right ventricular size is normal. There is normal pulmonary artery systolic pressure. The estimated right ventricular systolic pressure is 34.2 mmHg.   3. Right atrial size was mildly dilated.   4. The mitral valve is normal in structure. Mild mitral valve regurgitation. No evidence of mitral stenosis.   5. Tricuspid valve regurgitation is moderate.   6. The aortic valve is normal in structure. Aortic valve  regurgitation is not visualized. Aortic valve sclerosis/calcification is present, without any evidence of aortic stenosis.   7. The inferior vena cava is normal in size with greater than 50% respiratory variability, suggesting right atrial pressure of 3 mmHg.   8. Rhythm is atrial fibrillation rate 90 to 130 bpm     Assessment and Plan     #) perm afib Ventricular rates well-controlled Continue 100mg  toprol  BID Continue 0.0625 digoxin  daily   #) Hypercoag d/t perm afib CHA2DS2-VASc Score = at least 5 [CHF History: 0, HTN History: 1, Diabetes History: 1, Stroke History: 0, Vascular Disease History: 1, Age Score: 2, Gender Score: 0].  Therefore, the patient's annual risk of stroke is 7.2 %.    Stroke ppx - 5mg  eliquis  BID, appropriately dosed No bleeding concerns       Current medicines are reviewed at length with the patient today.   The patient does not have concerns regarding his medicines.  The following changes were made today:  none  Labs/ tests ordered today include:  Orders Placed This Encounter  Procedures   EKG 12-Lead     Disposition: Follow up with Dr. Cindie or EP APP in 6 months   Signed, Chantal Needle, NP  09/11/23  11:25 AM  Electrophysiology [facility]

## 2023-09-11 ENCOUNTER — Ambulatory Visit: Attending: Cardiology | Admitting: Cardiology

## 2023-09-11 VITALS — BP 128/78 | HR 95 | Ht 68.0 in | Wt 134.8 lb

## 2023-09-11 DIAGNOSIS — D6869 Other thrombophilia: Secondary | ICD-10-CM

## 2023-09-11 DIAGNOSIS — I4821 Permanent atrial fibrillation: Secondary | ICD-10-CM | POA: Diagnosis not present

## 2023-09-11 NOTE — Patient Instructions (Signed)
 Medication Instructions:  The current medical regimen is effective;  continue present plan and medications as directed. Please refer to the Current Medication list given to you today.   *If you need a refill on your cardiac medications before your next appointment, please call your pharmacy*  Follow-Up: At [facility], you and your health needs are our priority.  As part of our continuing mission to provide you with exceptional heart care, our providers are all part of one team.  This team includes your primary Cardiologist (physician) and Advanced Practice Providers or APPs (Physician Assistants and Nurse Practitioners) who all work together to provide you with the care you need, when you need it.  Your next appointment:   6 month(s)  Provider:   Suzann Riddle, NP    We recommend signing up for the patient portal called "MyChart".  Sign up information is provided on this After Visit Summary.  MyChart is used to connect with patients for Virtual Visits (Telemedicine).  Patients are able to view lab/test results, encounter notes, upcoming appointments, etc.  Non-urgent messages can be sent to your provider as well.   To learn more about what you can do with MyChart, go to [email or web address].

## 2023-10-07 ENCOUNTER — Other Ambulatory Visit: Payer: Self-pay | Admitting: Nurse Practitioner

## 2023-10-07 DIAGNOSIS — E782 Mixed hyperlipidemia: Secondary | ICD-10-CM

## 2023-10-08 NOTE — Telephone Encounter (Signed)
 Requested Prescriptions  Pending Prescriptions Disp Refills   atorvastatin  (LIPITOR) 40 MG tablet [Pharmacy Med Name: Atorvastatin  Calcium  40 MG Oral Tablet] 100 tablet 2    Sig: TAKE 1 TABLET BY MOUTH DAILY     Cardiovascular:  Antilipid - Statins Failed - 10/08/2023  1:15 PM      Failed - Lipid Panel in normal range within the last 12 months    Cholesterol, Total  Date Value Ref Range Status  07/24/2016 155 100 - 199 mg/dL Final   Cholesterol  Date Value Ref Range Status  07/06/2023 136 <200 mg/dL Final   LDL Cholesterol (Calc)  Date Value Ref Range Status  07/06/2023 55 mg/dL (calc) Final    Comment:    Reference range: <100 . Desirable range <100 mg/dL for primary prevention;   <70 mg/dL for patients with CHD or diabetic patients  with > or = 2 CHD risk factors. SABRA LDL-C is now calculated using the Martin-Hopkins  calculation, which is a validated novel method providing  better accuracy than the Friedewald equation in the  estimation of LDL-C.  Gladis APPLETHWAITE et al. SANDREA. 7986;689(80): 2061-2068  (http://education.[employer].com/faq/FAQ164)    HDL  Date Value Ref Range Status  07/06/2023 60 > OR = 40 mg/dL Final  93/92/7981 62 >60 mg/dL Final   Triglycerides  Date Value Ref Range Status  07/06/2023 124 <150 mg/dL Final         Passed - Patient is not pregnant      Passed - Valid encounter within last 12 months    Recent Outpatient Visits           3 months ago Longstanding persistent atrial fibrillation [facility])   [facility] [name], FNP   4 months ago Essential hypertension, benign   [facility] [name], [place]

## 2023-10-15 ENCOUNTER — Ambulatory Visit: Payer: Medicare Other

## 2023-10-15 DIAGNOSIS — Z0000 Encounter for general adult medical examination without abnormal findings: Secondary | ICD-10-CM

## 2023-10-15 NOTE — Patient Instructions (Signed)
 Garrett Yoder , Thank you for taking time out of your busy schedule to complete your Annual Wellness Visit with me. I enjoyed our conversation and look forward to speaking with you again next year. I, as well as your care team,  appreciate your ongoing commitment to your health goals. Please review the following plan we discussed and let me know if I can assist you in the future.  Follow up Visits: 10/20/24 @ 12:40 PM BY PHONE We will see or speak with you next year for your Next Medicare AWV with our clinical staff Have you seen your provider in the last 6 months (3 months if uncontrolled diabetes)? Yes  Clinician Recommendations:  Aim for 30 minutes of exercise or brisk walking, 6-8 glasses of water, and 5 servings of fruits and vegetables each day. TAKE CARE!      This is a list of the screenings recommended for you:  Health Maintenance  Topic Date Due   Zoster (Shingles) Vaccine (1 of 2) [birth year]/1965   DTaP/Tdap/Td vaccine (2 - Tdap) 06/29/2016   COVID-19 Vaccine (3 - Pfizer risk series) 07/01/2019   Yearly kidney health urinalysis for diabetes  07/04/2023   Flu Shot  09/18/2023   Hemoglobin A1C  10/27/2023   Eye exam for diabetics  02/20/2024   Yearly kidney function blood test for diabetes  07/05/2024   Complete foot exam   07/05/2024   Medicare Annual Wellness Visit  10/14/2024   Pneumococcal Vaccine for age over 19  Completed   HPV Vaccine  Aged Out   Meningitis B Vaccine  Aged Out   Colon Cancer Screening  Discontinued   Hepatitis C Screening  Discontinued    Advanced directives: (ACP Link)Information on Advanced Care Planning can be found at [place]  Secretary of [facility] Advance Health Care Directives Advance Health Care Directives. [email or web address]  Advance Care Planning is important because it:  [x]  Makes sure you receive the medical care that is consistent with your values, goals, and preferences  [x]  It provides guidance to your family and loved ones and reduces their  decisional burden about whether or not they are making the right decisions based on your wishes.  Follow the link provided in your after visit summary or read over the paperwork we have mailed to you to help you started getting your Advance Directives in place. If you need assistance in completing these, please reach out to us  so that we can help you!

## 2023-10-15 NOTE — Progress Notes (Signed)
 Subjective:   Garrett Yoder. is a 78 y.o. who presents for a Medicare Wellness preventive visit.  As a reminder, Annual Wellness Visits don't include a physical exam, and some assessments may be limited, especially if this visit is performed virtually. We may recommend an in-person follow-up visit with your provider if needed.  Visit Complete: Virtual I connected with  [name] on 10/15/23 by a audio enabled telemedicine application and verified that I am speaking with the correct person using two identifiers.  Patient Location: Home  Provider Location: Home Office  I discussed the limitations of evaluation and management by telemedicine. The patient expressed understanding and agreed to proceed.  Vital Signs: Because this visit was a virtual/telehealth visit, some criteria may be missing or patient reported. Any vitals not documented were not able to be obtained and vitals that have been documented are patient reported.  VideoDeclined- This patient declined Librarian, academic. Therefore the visit was completed with audio only.  Persons Participating in Visit: Patient.  AWV Questionnaire: No: Patient Medicare AWV questionnaire was not completed prior to this visit.  Cardiac Risk Factors include: advanced age (>61men, >1 women);diabetes mellitus;dyslipidemia;hypertension;male gender     Objective:    There were no vitals filed for this visit. There is no height or weight on file to calculate BMI.     10/15/2023   12:52 PM 07/29/2023    9:42 AM 04/28/2023    7:52 AM 04/26/2023    2:20 PM 01/20/2023    3:04 PM 10/10/2022    1:19 PM 05/27/2021    2:20 PM  Advanced Directives  Does Patient Have a Medical Advance Directive? No No No No No No No  Would patient like information on creating a medical advance directive? No - Patient declined  No - Patient declined  No - Patient declined  No - Patient declined    Current Medications  (verified) Outpatient Encounter Medications as of 10/15/2023  Medication Sig   apixaban  (ELIQUIS ) 5 MG TABS tablet TAKE 1 TABLET BY MOUTH TWICE  DAILY   atorvastatin  (LIPITOR) 40 MG tablet TAKE 1 TABLET BY MOUTH DAILY   bimatoprost (LUMIGAN) 0.01 % SOLN Place 1 drop into both eyes at bedtime.    digoxin  (LANOXIN ) 0.125 MG tablet TAKE 1/2 TABLET BY MOUTH EVERY DAY   insulin  detemir (LEVEMIR  FLEXTOUCH) 100 UNIT/ML FlexPen Inject 30 Units into the skin every evening.   insulin  lispro (HUMALOG) 100 UNIT/ML KwikPen Inject into the skin daily. Pt taking 4 units in AM, 12 units at noon   metFORMIN  (GLUCOPHAGE ) 1000 MG tablet Take 1,000 mg by mouth 2 (two) times daily with a meal.   metoprolol  succinate (TOPROL -XL) 100 MG 24 hr tablet TAKE 1 TABLET BY MOUTH  DAILY . TAKE WITH OR IMMEDIATELY FOLLOWING A MEAL   Multiple Vitamin (MULTI-VITAMINS) TABS Take 1 tablet by mouth 2 (two) times daily.    pioglitazone (ACTOS) 15 MG tablet Take 15 mg by mouth daily.   SIMBRINZA 1-0.2 % SUSP Place 1 drop into both eyes 3 (three) times daily.    tamsulosin  (FLOMAX ) 0.4 MG CAPS capsule TAKE 1 CAPSULE BY MOUTH DAILY   No facility-administered encounter medications on file as of 10/15/2023.    Allergies (verified) Codeine   History: Past Medical History:  Diagnosis Date   Anemia 08/14/2015   Anxiety    Aortic atherosclerosis (HCC) 11/25/2016   Chest CT Oct 2018   Aorto-iliac atherosclerosis (HCC) 11/25/2016   Abd/pelvic  CT Oct 2018   Coronary artery calcification seen on CAT scan 11/25/2016   Chest CT Oct 2018; refer to Dr. Darron   DDD (degenerative disc disease), cervical 08/21/2016   Depression    Diabetes mellitus without complication (HCC)    Facet arthropathy, cervical 08/21/2016   GERD (gastroesophageal reflux disease)    Heart murmur    As child   History of echocardiogram    a. 01/2022 Echo: EF 50-55%, no rwma, nl RV fxn w/ RVSP 34.81mmHg, Mildly dil RA. Mild MR. Mod TR. AoV sclerosis w/o  stenosis.   History of stress test    a. 01/2017 Ex MV: Poor ex capacity - held in stage I. 1mm Inflat ST depression. No ischemia/infarct. EF 55-65%.   Hyperlipidemia    Hypertension    Hypertrophy of prostate with urinary obstruction 11/25/2016   Pelvic CT October 2018   Incidental lung nodule, > 3mm and < 8mm 11/25/2016   5 mm nodule LLL, chest CT Oct 2018   Persistent atrial fibrillation (HCC)    a. 02/2022 Dx in 01/2022-->CHA2DS2VASc = 5-->Eliquis ; b. 02/2022 s/p DCCV (150J->200J->RSR).   Thrombocytosis 08/14/2015   Past Surgical History:  Procedure Laterality Date   ANTERIOR CERVICAL DECOMP/DISCECTOMY FUSION N/A 09/24/2016   Procedure: ANTERIOR CERVICAL DECOMPRESSION/DISCECTOMY FUSION 3 LEVELS-C4-7;  Surgeon: Clois Fret, MD;  Location: [facility];  Service: Neurosurgery;  Laterality: N/A;   CARDIOVERSION N/A 02/27/2022   Procedure: CARDIOVERSION;  Surgeon: Perla Evalene PARAS, MD;  Location: [facility];  Service: Cardiovascular;  Laterality: N/A;   CATARACT EXTRACTION     ELECTROMAGNETIC NAVIGATION BROCHOSCOPY Right 04/07/2018   Procedure: ELECTROMAGNETIC NAVIGATION BRONCHOSCOPY;  Surgeon: Isaiah Scrivener, MD;  Location: [facility];  Service: Cardiopulmonary;  Laterality: Right;   EYE SURGERY Right    Cataract Extraction with IOL   RETINAL LASER PROCEDURE     Family History  Problem Relation Age of Onset   Stroke Mother    Diabetes Mellitus II Father    Heart Problems Father    Heart disease Father    Diabetes Sister    Cancer Maternal Grandfather    Stroke Paternal Grandmother    Prostate cancer Neg Hx    Bladder Cancer Neg Hx    Kidney cancer Neg Hx    Social History   Socioeconomic History   Marital status: Widowed    Spouse name: Dagoberto   Number of children: Not on file   Years of education: Not on file   Highest education level: Not on file  Occupational History   Occupation: retired  Tobacco Use   Smoking status: Former    Current packs/day: 0.00    Average  packs/day: 1 pack/day for 30.0 years (30.0 ttl pk-yrs)    Types: Cigarettes    Start date: 07/18/1969    Quit date: 07/19/1999    Years since quitting: 24.2   Smokeless tobacco: Never  Vaping Use   Vaping status: Never Used  Substance and Sexual Activity   Alcohol use: No    Alcohol/week: 0.0 standard drinks of alcohol   Drug use: No   Sexual activity: Yes    Partners: Female  Other Topics Concern   Not on file  Social History Narrative   Not on file   Social Drivers of Health   Financial Resource Strain: Low Risk  (10/15/2023)   Overall Financial Resource Strain (CARDIA)    Difficulty of Paying Living Expenses: Not hard at all  Food Insecurity: No Food Insecurity (10/15/2023)   Hunger Vital  Sign    Worried About Programme researcher, broadcasting/film/video in the Last Year: Never true    Ran Out of Food in the Last Year: Never true  Transportation Needs: No Transportation Needs (10/15/2023)   PRAPARE - Administrator, Civil Service (Medical): No    Lack of Transportation (Non-Medical): No  Physical Activity: Insufficiently Active (10/15/2023)   Exercise Vital Sign    Days of Exercise per Week: 7 days    Minutes of Exercise per Session: 20 min  Stress: No Stress Concern Present (10/15/2023)   [employer] of Occupational Health - Occupational Stress Questionnaire    Feeling of Stress: Not at all  Social Connections: Moderately Isolated (10/15/2023)   Social Connection and Isolation Panel    Frequency of Communication with Friends and Family: Twice a week    Frequency of Social Gatherings with Friends and Family: Twice a week    Attends Religious Services: More than 4 times per year    Active Member of [employer] or Organizations: No    Attends Banker Meetings: Never    Marital Status: Widowed    Tobacco Counseling Counseling given: Not Answered    Clinical Intake:  Pre-visit preparation completed: Yes  Pain : No/denies pain     BMI - recorded: 20.4 Nutritional  Status: BMI of 19-24  Normal Nutritional Risks: None Diabetes: Yes CBG done?: No Did pt. bring in CBG monitor from home?: No  Lab Results  Component Value Date   HGBA1C 7.8 (H) 04/26/2023   HGBA1C 8.8 (H) 07/03/2022   HGBA1C 7.1 (H) 07/02/2021     How often do you need to have someone help you when you read instructions, pamphlets, or other written materials from your doctor or pharmacy?: 1 - Never  Interpreter Needed?: No  Information entered by :: JHONNIE DAS, LPN   Activities of Daily Living    10/15/2023   12:53 PM 04/28/2023    7:52 AM  In your present state of health, do you have any difficulty performing the following activities:  Hearing? 0 0  Vision? 0 0  Difficulty concentrating or making decisions? 0 0  Walking or climbing stairs? 0   Dressing or bathing? 0   Doing errands, shopping? 0   Preparing Food and eating ? N   Using the Toilet? N   In the past six months, have you accidently leaked urine? N   Do you have problems with loss of bowel control? N   Managing your Medications? N   Managing your Finances? N   Housekeeping or managing your Housekeeping? N     Patient Care Team: [name], FNP as PCP - General (Nurse Practitioner) Darron Deatrice LABOR, MD as PCP - Cardiology (Cardiology) Cindie Ole DASEN, MD as PCP - Electrophysiology (Cardiology) Damian Therisa HERO, MD as Physician Assistant (Endocrinology) Melanee Annah BROCKS, MD as Consulting Physician (Oncology) Twylla Glendia BROCKS, MD (Urology) Jaye Fallow, MD as Referring Physician (Ophthalmology) Riddle, Suzann, NP as Nurse Practitioner (Clinical Cardiac Electrophysiology)  I have updated your Care Teams any recent Medical Services you may have received from other providers in the past year.     Assessment:   This is a routine wellness examination for Sudeep.  Hearing/Vision screen Hearing Screening - Comments:: NO AIDS Vision Screening - Comments:: WEARS GLASSES ALL DAY- DR.PORFILIO   Goals  Addressed             This Visit's Progress    DIET - EAT MORE  FRUITS AND VEGETABLES         Depression Screen     10/15/2023   12:49 PM 07/06/2023    1:08 PM 05/14/2023    1:24 PM 01/05/2023    1:36 PM 10/10/2022    1:13 PM 07/03/2022   12:42 PM 01/02/2022    1:09 PM  PHQ 2/9 Scores  PHQ - 2 Score 1 0 0 0 0 1 0  PHQ- 9 Score 1 0 0   1     Fall Risk     10/15/2023   12:53 PM 07/06/2023    1:08 PM 05/14/2023    1:23 PM 01/05/2023    1:36 PM 10/10/2022    1:10 PM  Fall Risk   Falls in the past year? 0 0 0 0 0  Number falls in past yr: 0 0 0 0 0  Injury with Fall? 0 0 0 0 0  Risk for fall due to : No Fall Risks No Fall Risks  No Fall Risks No Fall Risks  Follow up Falls evaluation completed;Falls prevention discussed Falls evaluation completed Falls evaluation completed Falls prevention discussed Education provided;Falls prevention discussed    MEDICARE RISK AT HOME:  Medicare Risk at Home Any stairs in or around the home?: Yes If so, are there any without handrails?: No Home free of loose throw rugs in walkways, pet beds, electrical cords, etc?: Yes Adequate lighting in your home to reduce risk of falls?: Yes Life alert?: No Use of a cane, walker or w/c?: No Grab bars in the bathroom?: Yes Shower chair or bench in shower?: Yes Elevated toilet seat or a handicapped toilet?: No  TIMED UP AND GO:  Was the test performed?  No  Cognitive Function: 6CIT completed        10/15/2023   12:56 PM 10/10/2022    1:21 PM 09/24/2021    1:04 PM 03/31/2019   11:33 AM  6CIT Screen  What Year? 0 points 0 points 0 points 0 points  What month? 0 points 0 points 0 points 0 points  What time? 0 points 0 points 0 points 0 points  Count back from 20 0 points 0 points 0 points 0 points  Months in reverse 0 points 0 points 0 points 0 points  Repeat phrase 0 points 0 points 0 points 0 points  Total Score 0 points 0 points 0 points 0 points    Immunizations Immunization History   Administered Date(s) Administered   Fluad Quad(high Dose 65+) 11/29/2018, 01/03/2021, 01/02/2022   INFLUENZA, HIGH DOSE SEASONAL PF 11/20/2014, 01/17/2016, 11/25/2016, 01/26/2018   Influenza,inj,Quad PF,6+ Mos 12/26/2013   Influenza-Unspecified 01/18/2008, 11/28/2008, 01/14/2010, 12/10/2010, 02/02/2012, 02/01/2013, 12/26/2013   PFIZER(Purple Top)SARS-COV-2 Vaccination 05/13/2019, 06/03/2019   Pneumococcal Conjugate-13 04/21/2013   Pneumococcal Polysaccharide-23 08/27/2011   Td 06/30/2006   Zoster, Live 02/02/2012    Screening Tests Health Maintenance  Topic Date Due   Zoster Vaccines- Shingrix (1 of 2) 05/22/1965   DTaP/Tdap/Td (2 - Tdap) 06/29/2016   COVID-19 Vaccine (3 - Pfizer risk series) 07/01/2019   Diabetic kidney evaluation - Urine ACR  07/04/2023   INFLUENZA VACCINE  09/18/2023   HEMOGLOBIN A1C  10/27/2023   OPHTHALMOLOGY EXAM  02/20/2024   Diabetic kidney evaluation - eGFR measurement  07/05/2024   FOOT EXAM  07/05/2024   Medicare Annual Wellness (AWV)  10/14/2024   Pneumococcal Vaccine: 50+ Years  Completed   HPV VACCINES  Aged Out   Meningococcal B Vaccine  Aged Out   Colonoscopy  Discontinued   Hepatitis C Screening  Discontinued    Health Maintenance  Health Maintenance Due  Topic Date Due   Zoster Vaccines- Shingrix (1 of 2) 05/22/1965   DTaP/Tdap/Td (2 - Tdap) 06/29/2016   COVID-19 Vaccine (3 - Pfizer risk series) 07/01/2019   Diabetic kidney evaluation - Urine ACR  07/04/2023   INFLUENZA VACCINE  09/18/2023   Health Maintenance Items Addressed: AGED OUT OF COLONOSCOPY; NEEDS PNA, TDAP, SHINGRIX & COVID  Additional Screening:  Vision Screening: Recommended annual ophthalmology exams for early detection of glaucoma and other disorders of the eye. Would you like a referral to an eye doctor? No    Dental Screening: Recommended annual dental exams for proper oral hygiene  Community Resource Referral / Chronic Care Management: CRR required this visit?   No   CCM required this visit?  No   Plan:    I have personally reviewed and noted the following in the patient's chart:   Medical and social history Use of alcohol, tobacco or illicit drugs  Current medications and supplements including opioid prescriptions. Patient is not currently taking opioid prescriptions. Functional ability and status Nutritional status Physical activity Advanced directives List of other physicians Hospitalizations, surgeries, and ER visits in previous 12 months Vitals Screenings to include cognitive, depression, and falls Referrals and appointments  In addition, I have reviewed and discussed with patient certain preventive protocols, quality metrics, and best practice recommendations. A written personalized care plan for preventive services as well as general preventive health recommendations were provided to patient.   Jhonnie GORMAN Das, LPN   [id number]   After Visit Summary: (MyChart) Due to this being a telephonic visit, the after visit summary with patients personalized plan was offered to patient via MyChart   Notes: Nothing significant to report at this time.

## 2023-11-13 ENCOUNTER — Encounter: Payer: Self-pay | Admitting: Emergency Medicine

## 2023-11-13 ENCOUNTER — Other Ambulatory Visit: Payer: Self-pay

## 2023-11-13 ENCOUNTER — Emergency Department

## 2023-11-13 ENCOUNTER — Inpatient Hospital Stay
Admission: EM | Admit: 2023-11-13 | Discharge: 2023-11-18 | DRG: 637 | Disposition: A | Discharge status: Skilled Nursing Facility | Attending: Internal Medicine | Admitting: Internal Medicine

## 2023-11-13 DIAGNOSIS — I130 Hypertensive heart and chronic kidney disease with heart failure and stage 1 through stage 4 chronic kidney disease, or unspecified chronic kidney disease: Secondary | ICD-10-CM | POA: Diagnosis present

## 2023-11-13 DIAGNOSIS — R338 Other retention of urine: Secondary | ICD-10-CM | POA: Diagnosis present

## 2023-11-13 DIAGNOSIS — Z794 Long term (current) use of insulin: Secondary | ICD-10-CM

## 2023-11-13 DIAGNOSIS — E162 Hypoglycemia, unspecified: Secondary | ICD-10-CM

## 2023-11-13 DIAGNOSIS — S2232XA Fracture of one rib, left side, initial encounter for closed fracture: Secondary | ICD-10-CM | POA: Diagnosis present

## 2023-11-13 DIAGNOSIS — S92355A Nondisplaced fracture of fifth metatarsal bone, left foot, initial encounter for closed fracture: Secondary | ICD-10-CM | POA: Diagnosis present

## 2023-11-13 DIAGNOSIS — J811 Chronic pulmonary edema: Secondary | ICD-10-CM | POA: Diagnosis present

## 2023-11-13 DIAGNOSIS — Z79899 Other long term (current) drug therapy: Secondary | ICD-10-CM

## 2023-11-13 DIAGNOSIS — I4891 Unspecified atrial fibrillation: Secondary | ICD-10-CM | POA: Diagnosis present

## 2023-11-13 DIAGNOSIS — N1832 Chronic kidney disease, stage 3b: Secondary | ICD-10-CM | POA: Diagnosis present

## 2023-11-13 DIAGNOSIS — Z885 Allergy status to narcotic agent status: Secondary | ICD-10-CM

## 2023-11-13 DIAGNOSIS — G9341 Metabolic encephalopathy: Secondary | ICD-10-CM | POA: Diagnosis present

## 2023-11-13 DIAGNOSIS — E785 Hyperlipidemia, unspecified: Secondary | ICD-10-CM | POA: Diagnosis present

## 2023-11-13 DIAGNOSIS — W19XXXA Unspecified fall, initial encounter: Principal | ICD-10-CM

## 2023-11-13 DIAGNOSIS — I4892 Unspecified atrial flutter: Secondary | ICD-10-CM | POA: Diagnosis present

## 2023-11-13 DIAGNOSIS — I5031 Acute diastolic (congestive) heart failure: Secondary | ICD-10-CM | POA: Diagnosis present

## 2023-11-13 DIAGNOSIS — S51012A Laceration without foreign body of left elbow, initial encounter: Secondary | ICD-10-CM | POA: Diagnosis present

## 2023-11-13 DIAGNOSIS — W1830XA Fall on same level, unspecified, initial encounter: Secondary | ICD-10-CM | POA: Diagnosis present

## 2023-11-13 DIAGNOSIS — Z823 Family history of stroke: Secondary | ICD-10-CM

## 2023-11-13 DIAGNOSIS — E11649 Type 2 diabetes mellitus with hypoglycemia without coma: Secondary | ICD-10-CM | POA: Diagnosis not present

## 2023-11-13 DIAGNOSIS — E1122 Type 2 diabetes mellitus with diabetic chronic kidney disease: Secondary | ICD-10-CM | POA: Diagnosis present

## 2023-11-13 DIAGNOSIS — N401 Enlarged prostate with lower urinary tract symptoms: Secondary | ICD-10-CM | POA: Diagnosis present

## 2023-11-13 DIAGNOSIS — I2510 Atherosclerotic heart disease of native coronary artery without angina pectoris: Secondary | ICD-10-CM | POA: Diagnosis present

## 2023-11-13 DIAGNOSIS — D72828 Other elevated white blood cell count: Secondary | ICD-10-CM | POA: Diagnosis present

## 2023-11-13 DIAGNOSIS — Z8249 Family history of ischemic heart disease and other diseases of the circulatory system: Secondary | ICD-10-CM

## 2023-11-13 DIAGNOSIS — Z981 Arthrodesis status: Secondary | ICD-10-CM

## 2023-11-13 DIAGNOSIS — R000 Tachycardia, unspecified: Secondary | ICD-10-CM | POA: Diagnosis present

## 2023-11-13 DIAGNOSIS — R55 Syncope and collapse: Secondary | ICD-10-CM

## 2023-11-13 DIAGNOSIS — Z7901 Long term (current) use of anticoagulants: Secondary | ICD-10-CM

## 2023-11-13 DIAGNOSIS — I4821 Permanent atrial fibrillation: Secondary | ICD-10-CM | POA: Diagnosis present

## 2023-11-13 DIAGNOSIS — J9601 Acute respiratory failure with hypoxia: Secondary | ICD-10-CM | POA: Diagnosis not present

## 2023-11-13 DIAGNOSIS — Z833 Family history of diabetes mellitus: Secondary | ICD-10-CM

## 2023-11-13 DIAGNOSIS — S93401A Sprain of unspecified ligament of right ankle, initial encounter: Secondary | ICD-10-CM | POA: Diagnosis present

## 2023-11-13 DIAGNOSIS — Z87891 Personal history of nicotine dependence: Secondary | ICD-10-CM

## 2023-11-13 DIAGNOSIS — Z7984 Long term (current) use of oral hypoglycemic drugs: Secondary | ICD-10-CM

## 2023-11-13 DIAGNOSIS — D72829 Elevated white blood cell count, unspecified: Secondary | ICD-10-CM | POA: Insufficient documentation

## 2023-11-13 DIAGNOSIS — S92345A Nondisplaced fracture of fourth metatarsal bone, left foot, initial encounter for closed fracture: Secondary | ICD-10-CM | POA: Diagnosis present

## 2023-11-13 LAB — COMPREHENSIVE METABOLIC PANEL WITH GFR
ALT: 39 U/L (ref 0–44)
AST: 42 U/L — ABNORMAL HIGH (ref 15–41)
Albumin: 4 g/dL (ref 3.5–5.0)
Alkaline Phosphatase: 111 U/L (ref 38–126)
Anion gap: 13 (ref 5–15)
BUN: 21 mg/dL (ref 8–23)
CO2: 21 mmol/L — ABNORMAL LOW (ref 22–32)
Calcium: 9 mg/dL (ref 8.9–10.3)
Chloride: 105 mmol/L (ref 98–111)
Creatinine, Ser: 1.57 mg/dL — ABNORMAL HIGH (ref 0.61–1.24)
GFR, Estimated: 45 mL/min — ABNORMAL LOW (ref >60)
Glucose, Bld: 120 mg/dL — ABNORMAL HIGH (ref 70–99)
Potassium: 4.1 mmol/L (ref 3.5–5.1)
Sodium: 139 mmol/L (ref 135–145)
Total Bilirubin: 0.5 mg/dL (ref 0.0–1.2)
Total Protein: 7.1 g/dL (ref 6.5–8.1)

## 2023-11-13 LAB — CBC WITH DIFFERENTIAL/PLATELET
Abs Immature Granulocytes: 0.13 K/uL — ABNORMAL HIGH (ref 0.00–0.07)
Basophils Absolute: 0 K/uL (ref 0.0–0.1)
Basophils Relative: 0 %
Eosinophils Absolute: 0.1 K/uL (ref 0.0–0.5)
Eosinophils Relative: 1 %
HCT: 34 % — ABNORMAL LOW (ref 39.0–52.0)
Hemoglobin: 10.8 g/dL — ABNORMAL LOW (ref 13.0–17.0)
Immature Granulocytes: 1 %
Lymphocytes Relative: 8 %
Lymphs Abs: 1 K/uL (ref 0.7–4.0)
MCH: 30.7 pg (ref 26.0–34.0)
MCHC: 31.8 g/dL (ref 30.0–36.0)
MCV: 96.6 fL (ref 80.0–100.0)
Monocytes Absolute: 0.6 K/uL (ref 0.1–1.0)
Monocytes Relative: 5 %
Neutro Abs: 10.6 K/uL — ABNORMAL HIGH (ref 1.7–7.7)
Neutrophils Relative %: 85 %
Platelets: 476 K/uL — ABNORMAL HIGH (ref 150–400)
RBC: 3.52 MIL/uL — ABNORMAL LOW (ref 4.22–5.81)
RDW: 13.9 % (ref 11.5–15.5)
WBC: 12.4 K/uL — ABNORMAL HIGH (ref 4.0–10.5)
nRBC: 0 % (ref 0.0–0.2)

## 2023-11-13 LAB — CBG MONITORING, ED
Glucose-Capillary: 126 mg/dL — ABNORMAL HIGH (ref 70–99)
Glucose-Capillary: 157 mg/dL — ABNORMAL HIGH (ref 70–99)

## 2023-11-13 LAB — TROPONIN I (HIGH SENSITIVITY): Troponin I (High Sensitivity): 8 ng/L (ref <18)

## 2023-11-13 MED ORDER — OXYCODONE HCL 5 MG PO TABS
5.0000 mg | ORAL_TABLET | ORAL | Status: DC | PRN
  Administered 2023-11-13: 5 mg via ORAL
  Filled 2023-11-13: qty 1

## 2023-11-13 MED ORDER — SODIUM CHLORIDE 0.9% FLUSH
3.0000 mL | Freq: Two times a day (BID) | INTRAVENOUS | Status: DC
  Administered 2023-11-14 – 2023-11-18 (×9): 3 mL via INTRAVENOUS

## 2023-11-13 MED ORDER — APIXABAN 5 MG PO TABS
5.0000 mg | ORAL_TABLET | Freq: Two times a day (BID) | ORAL | Status: DC
  Administered 2023-11-14 – 2023-11-18 (×10): 5 mg via ORAL
  Filled 2023-11-13 (×10): qty 1

## 2023-11-13 MED ORDER — ONDANSETRON HCL 4 MG PO TABS: 4.0000 mg | ORAL_TABLET | Freq: Four times a day (QID) | ORAL | Status: DC | PRN

## 2023-11-13 MED ORDER — ATORVASTATIN CALCIUM 20 MG PO TABS
40.0000 mg | ORAL_TABLET | Freq: Every day | ORAL | Status: DC
  Administered 2023-11-14 – 2023-11-18 (×5): 40 mg via ORAL
  Filled 2023-11-13 (×5): qty 2

## 2023-11-13 MED ORDER — DEXTROSE 10 % IV SOLN: INTRAVENOUS | Status: DC

## 2023-11-13 MED ORDER — GUAIFENESIN ER 600 MG PO TB12
600.0000 mg | ORAL_TABLET | Freq: Two times a day (BID) | ORAL | Status: DC
  Administered 2023-11-14 – 2023-11-18 (×10): 600 mg via ORAL
  Filled 2023-11-13 (×10): qty 1

## 2023-11-13 MED ORDER — MORPHINE SULFATE (PF) 2 MG/ML IV SOLN: 2.0000 mg | INTRAVENOUS | Status: DC | PRN

## 2023-11-13 MED ORDER — DILTIAZEM HCL 60 MG PO TABS
30.0000 mg | ORAL_TABLET | Freq: Once | ORAL | Status: AC
  Administered 2023-11-13: 30 mg via ORAL
  Filled 2023-11-13: qty 1

## 2023-11-13 MED ORDER — ONDANSETRON HCL 4 MG/2ML IJ SOLN: 4.0000 mg | Freq: Four times a day (QID) | INTRAMUSCULAR | Status: DC | PRN

## 2023-11-13 MED ORDER — DIGOXIN 125 MCG PO TABS
62.5000 ug | ORAL_TABLET | Freq: Every day | ORAL | Status: AC
Start: 2023-11-14 — End: ?
  Administered 2023-11-14 – 2023-11-18 (×5): 62.5 ug via ORAL
  Filled 2023-11-13 (×5): qty 0.5

## 2023-11-13 MED ORDER — ACETAMINOPHEN 650 MG RE SUPP: 650.0000 mg | Freq: Four times a day (QID) | RECTAL | Status: DC | PRN

## 2023-11-13 MED ORDER — SODIUM CHLORIDE 0.9 % IV BOLUS
1000.0000 mL | Freq: Once | INTRAVENOUS | Status: AC
  Administered 2023-11-13: 1000 mL via INTRAVENOUS

## 2023-11-13 MED ORDER — ACETAMINOPHEN 325 MG PO TABS
650.0000 mg | ORAL_TABLET | Freq: Four times a day (QID) | ORAL | Status: DC | PRN
  Administered 2023-11-15 – 2023-11-18 (×2): 650 mg via ORAL
  Filled 2023-11-13 (×2): qty 2

## 2023-11-13 MED ORDER — HYDROCODONE-ACETAMINOPHEN 5-325 MG PO TABS: 1.0000 | ORAL_TABLET | ORAL | Status: DC | PRN

## 2023-11-13 MED ORDER — DILTIAZEM HCL-DEXTROSE 125-5 MG/125ML-% IV SOLN (PREMIX)
5.0000 mg/h | INTRAVENOUS | Status: DC
Start: 2023-11-13 — End: 2023-11-13
  Filled 2023-11-13: qty 125

## 2023-11-13 MED ORDER — TAMSULOSIN HCL 0.4 MG PO CAPS
0.4000 mg | ORAL_CAPSULE | Freq: Every day | ORAL | Status: DC
  Administered 2023-11-14 – 2023-11-18 (×5): 0.4 mg via ORAL
  Filled 2023-11-13 (×5): qty 1

## 2023-11-13 MED ORDER — DILTIAZEM HCL 25 MG/5ML IV SOLN
10.0000 mg | Freq: Once | INTRAVENOUS | Status: AC
  Administered 2023-11-13: 10 mg via INTRAVENOUS
  Filled 2023-11-13: qty 5

## 2023-11-13 MED ORDER — ACETAMINOPHEN 500 MG PO TABS
1000.0000 mg | ORAL_TABLET | Freq: Once | ORAL | Status: AC
  Administered 2023-11-13: 1000 mg via ORAL
  Filled 2023-11-13: qty 2

## 2023-11-13 MED ORDER — INSULIN ASPART 100 UNIT/ML IJ SOLN: 0.0000 [IU] | INTRAMUSCULAR | Status: DC

## 2023-11-13 MED ORDER — METOPROLOL SUCCINATE ER 100 MG PO TB24
100.0000 mg | ORAL_TABLET | Freq: Every day | ORAL | Status: DC
  Administered 2023-11-14 – 2023-11-18 (×5): 100 mg via ORAL
  Filled 2023-11-13 (×2): qty 1
  Filled 2023-11-13: qty 2
  Filled 2023-11-13 (×2): qty 1

## 2023-11-13 NOTE — Assessment & Plan Note (Addendum)
 Acute metabolic encephalopathy secondary to hypoglycemia Syncope and collapse secondary to hypoglycemia Patient was found semiresponsive on the floor by his sister Hold home Levemir , Humalog, Actos and metformin  Will start D10 with CBG Q 1-2 D50 as needed Hypoglycemia protocol

## 2023-11-13 NOTE — H&P (Signed)
 History and Physical    Patient: Garrett Yoder. FMW:[id number] DOB: 1946/01/28 DOA: 11/13/2023 DOS: the patient was seen and examined on 11/13/2023 PCP: [name], FNP  Patient coming from: Home  Chief Complaint:  Chief Complaint  Patient presents with   Fall   Hypoglycemia    HPI: Garrett Yoder. is a 78 y.o. male with medical history significant for Permanent A-fib on Eliquis , DM, HTN, BPH, CKD lllb, being admitted with A-fib with RVR and  symptomatic hypoglycemia/encephalopathy resulting in a fall with left metatarsal fractures.  Patient was found on the floor by his sister, semiresponsive.  Patient is now awake and alert, recently had his insulin  dose decreased by his endocrinologist who he saw on 9/23.  He had apparently been running hypoglycemic between 10 PM and 2 AM based on blood sugars uploaded from his Dexcom.  Today he said he was getting up from a chair and the next thing he was out.  He otherwise denies any recent illnesses, no vomiting or diarrhea.  No change in oral intake. On arrival of EMS CBG was 43 improving to 57 with oral glucose.  ,100 by arrival.  He reports recent changes in his insulin .  On arrival in the ED he was noted to be in rapid A-fib with heart rate in the 150s.  BP initially 161/85, but trended down to 104/86 during workup.,  Labs were notable for WBC of 12,000 Hemoglobin 10.8 (baseline 11-11.6), creatinine 1.57 near baseline, blood sugar 120. UA not done EKG showing A-fib at 130 Trauma imaging which included CT head and C-spine left rib series and x-ray foot significant for age-indeterminate fifth left rib fracture and acute fractures 4th and 5th left metatarsals  Patient was treated with an NS bolus, diltiazem  IV 10 mg with improvement in heart rate to the 90s by admission he was also given oral Cardizem  30 mg. He was placed in a boot and given oxycodone  for pain.  Admission requested.     Past Medical History:  Diagnosis Date    Anemia 08/14/2015   Anxiety    Aortic atherosclerosis 11/25/2016   Chest CT Oct 2018   Aorto-iliac atherosclerosis 11/25/2016   Abd/pelvic CT Oct 2018   Coronary artery calcification seen on CAT scan 11/25/2016   Chest CT Oct 2018; refer to Dr. Darron   DDD (degenerative disc disease), cervical 08/21/2016   Depression    Diabetes mellitus without complication (HCC)    Facet arthropathy, cervical 08/21/2016   GERD (gastroesophageal reflux disease)    Heart murmur    As child   History of echocardiogram    a. 01/2022 Echo: EF 50-55%, no rwma, nl RV fxn w/ RVSP 34.10mmHg, Mildly dil RA. Mild MR. Mod TR. AoV sclerosis w/o stenosis.   History of stress test    a. 01/2017 Ex MV: Poor ex capacity - held in stage I. 1mm Inflat ST depression. No ischemia/infarct. EF 55-65%.   Hyperlipidemia    Hypertension    Hypertrophy of prostate with urinary obstruction 11/25/2016   Pelvic CT October 2018   Incidental lung nodule, > 3mm and < 8mm 11/25/2016   5 mm nodule LLL, chest CT Oct 2018   Persistent atrial fibrillation (HCC)    a. 02/2022 Dx in 01/2022-->CHA2DS2VASc = 5-->Eliquis ; b. 02/2022 s/p DCCV (150J->200J->RSR).   Thrombocytosis 08/14/2015   Past Surgical History:  Procedure Laterality Date   ANTERIOR CERVICAL DECOMP/DISCECTOMY FUSION N/A 09/24/2016   Procedure: ANTERIOR CERVICAL DECOMPRESSION/DISCECTOMY FUSION 3 LEVELS-C4-7;  Surgeon: Clois Fret, MD;  Location: [facility];  Service: Neurosurgery;  Laterality: N/A;   CARDIOVERSION N/A 02/27/2022   Procedure: CARDIOVERSION;  Surgeon: Perla Evalene PARAS, MD;  Location: [facility];  Service: Cardiovascular;  Laterality: N/A;   CATARACT EXTRACTION     ELECTROMAGNETIC NAVIGATION BROCHOSCOPY Right 04/07/2018   Procedure: ELECTROMAGNETIC NAVIGATION BRONCHOSCOPY;  Surgeon: Isaiah Scrivener, MD;  Location: [facility];  Service: Cardiopulmonary;  Laterality: Right;   EYE SURGERY Right    Cataract Extraction with IOL   RETINAL LASER PROCEDURE     Social  History:  reports that he quit smoking about 24 years ago. His smoking use included cigarettes. He started smoking about 54 years ago. He has a 30 pack-year smoking history. He has never used smokeless tobacco. He reports that he does not drink alcohol and does not use drugs.  Allergies  Allergen Reactions   Codeine Nausea Only    Other reaction(s): makes him sick to his stomach Other reaction(s): makes him sick to his stomach    Family History  Problem Relation Age of Onset   Stroke Mother    Diabetes Mellitus II Father    Heart Problems Father    Heart disease Father    Diabetes Sister    Cancer Maternal Grandfather    Stroke Paternal Grandmother    Prostate cancer Neg Hx    Bladder Cancer Neg Hx    Kidney cancer Neg Hx     Prior to Admission medications   Medication Sig Start Date End Date Taking? Authorizing Provider  apixaban  (ELIQUIS ) 5 MG TABS tablet TAKE 1 TABLET BY MOUTH TWICE  DAILY 07/30/23   Darron Deatrice LABOR, MD  atorvastatin  (LIPITOR) 40 MG tablet TAKE 1 TABLET BY MOUTH DAILY 10/08/23   Pender, Julie F, FNP  bimatoprost (LUMIGAN) 0.01 % SOLN Place 1 drop into both eyes at bedtime.     [provider]  digoxin  (LANOXIN ) 0.125 MG tablet TAKE 1/2 TABLET BY MOUTH EVERY DAY 08/05/23   Riddle, Suzann, NP  insulin  detemir (LEVEMIR  FLEXTOUCH) 100 UNIT/ML FlexPen Inject 30 Units into the skin every evening. 09/02/19   [provider]  insulin  lispro (HUMALOG) 100 UNIT/ML KwikPen Inject into the skin daily. Pt taking 4 units in AM, 12 units at noon 03/09/19   [provider]  metFORMIN  (GLUCOPHAGE ) 1000 MG tablet Take 1,000 mg by mouth 2 (two) times daily with a meal. 04/02/21   [provider]  metoprolol  succinate (TOPROL -XL) 100 MG 24 hr tablet TAKE 1 TABLET BY MOUTH  DAILY . TAKE WITH OR IMMEDIATELY FOLLOWING A MEAL 11/13/22   Riddle, Suzann, NP  Multiple Vitamin (MULTI-VITAMINS) TABS Take 1 tablet by mouth 2 (two) times daily.     [provider]  pioglitazone (ACTOS) 15 MG tablet Take 15 mg by mouth daily.    [provider]  SIMBRINZA 1-0.2 % SUSP Place 1 drop into both eyes 3 (three) times daily.     [provider]  tamsulosin  (FLOMAX ) 0.4 MG CAPS capsule TAKE 1 CAPSULE BY MOUTH DAILY 03/27/23   [name], FNP    Physical Exam: Vitals:   11/13/23 2230 11/13/23 2247 11/13/23 2251 11/13/23 2253  BP: 97/76   104/86  Pulse: 86 100 90 94  Resp: 12  18   Temp:      TempSrc:      SpO2: 100% 99% 100%   Weight:      Height:       Physical Exam  Vitals and nursing note reviewed.  Constitutional:      General: He is not in acute distress. HENT:     Head: Normocephalic and atraumatic.  Cardiovascular:     Rate and Rhythm: Normal rate and regular rhythm.     Heart sounds: Normal heart sounds.  Pulmonary:     Effort: Pulmonary effort is normal.     Breath sounds: Normal breath sounds.  Abdominal:     Palpations: Abdomen is soft.     Tenderness: There is no abdominal tenderness.  Musculoskeletal:     Comments: Left foot in boot, pain on range of motion right ankle  Neurological:     Mental Status: Mental status is at baseline.     Labs on Admission: I have personally reviewed following labs and imaging studies  CBC: Recent Labs  Lab 11/13/23 1756  WBC 12.4*  NEUTROABS 10.6*  HGB 10.8*  HCT 34.0*  MCV 96.6  PLT 476*   Basic Metabolic Panel: Recent Labs  Lab 11/13/23 1756  NA 139  K 4.1  CL 105  CO2 21*  GLUCOSE 120*  BUN 21  CREATININE 1.57*  CALCIUM  9.0   GFR: Estimated Creatinine Clearance: 34.1 mL/min (A) (by C-G formula based on SCr of 1.57 mg/dL (H)). Liver Function Tests: Recent Labs  Lab 11/13/23 1756  AST 42*  ALT 39  ALKPHOS 111  BILITOT 0.5  PROT 7.1  ALBUMIN 4.0   No results for input(s): LIPASE, AMYLASE in the last 168 hours. No results for input(s): AMMONIA in the last 168 hours. Coagulation Profile: No results for input(s): INR,  PROTIME in the last 168 hours. Cardiac Enzymes: No results for input(s): CKTOTAL, CKMB, CKMBINDEX, TROPONINI in the last 168 hours. BNP (last 3 results) No results for input(s): PROBNP in the last 8760 hours. HbA1C: No results for input(s): HGBA1C in the last 72 hours. CBG: Recent Labs  Lab 11/13/23 1751 11/13/23 2301  GLUCAP 126* 157*   Lipid Profile: No results for input(s): CHOL, HDL, LDLCALC, TRIG, CHOLHDL, LDLDIRECT in the last 72 hours. Thyroid  Function Tests: No results for input(s): TSH, T4TOTAL, FREET4, T3FREE, THYROIDAB in the last 72 hours. Anemia Panel: No results for input(s): VITAMINB12, FOLATE, FERRITIN, TIBC, IRON, RETICCTPCT in the last 72 hours. Urine analysis:    Component Value Date/Time   COLORURINE YELLOW (A) 04/26/2023 1422   APPEARANCEUR CLEAR (A) 04/26/2023 1422   APPEARANCEUR Clear 08/29/2020 1515   LABSPEC 1.021 04/26/2023 1422   PHURINE 7.0 04/26/2023 1422   GLUCOSEU >=500 (A) 04/26/2023 1422   HGBUR SMALL (A) 04/26/2023 1422   BILIRUBINUR NEGATIVE 04/26/2023 1422   BILIRUBINUR Negative 08/29/2020 1515   KETONESUR 5 (A) 04/26/2023 1422   PROTEINUR 30 (A) 04/26/2023 1422   NITRITE NEGATIVE 04/26/2023 1422   LEUKOCYTESUR NEGATIVE 04/26/2023 1422    Radiological Exams on Admission: CT Cervical Spine Wo Contrast Result Date: 11/13/2023 CLINICAL DATA:  Unwitnessed fall. EXAM: CT CERVICAL SPINE WITHOUT CONTRAST TECHNIQUE: Multidetector CT imaging of the cervical spine was performed without intravenous contrast. Multiplanar CT image reconstructions were also generated. RADIATION DOSE REDUCTION: This exam was performed according to the departmental dose-optimization program which includes automated exposure control, adjustment of the mA and/or kV according to patient size and/or use of iterative reconstruction technique. COMPARISON:  May 09, 2007 FINDINGS: Alignment: Normal. Skull base and vertebrae: No  acute fracture. A metallic density fusion plate and screws are seen along the anterior aspects of the C4, C5, C6 and C7 vertebral bodies. This represents  a new finding when compared to the prior exam. Soft tissues and spinal canal: No prevertebral fluid or swelling. No visible canal hematoma. Disc levels: Multilevel endplate sclerosis and postoperative changes consistent with surgical fusion are seen at the levels of C4-C5, C5-C6 and C6-C7. Marked severity posterior bony spurring is also seen at these levels. Bilateral marked severity multilevel facet joint hypertrophy is noted. Upper chest: Negative. Other: A 7 mm benign-appearing well-defined sclerotic focus is seen within the medial aspect of the first left rib (axial CT image 86, CT series 2). This is stable in appearance when compared to the prior chest CT, dated December 18, 2020. IMPRESSION: 1. No acute fracture or subluxation in the cervical spine. 2. Postoperative changes consistent with prior surgical fusion at the levels of C4-C5, C5-C6 and C6-C7. 3. Marked severity multilevel degenerative changes. Electronically Signed   By: Suzen Dials M.D.   On: 11/13/2023 20:58   CT Head Wo Contrast Result Date: 11/13/2023 CLINICAL DATA:  Unwitnessed fall. EXAM: CT HEAD WITHOUT CONTRAST TECHNIQUE: Contiguous axial images were obtained from the base of the skull through the vertex without intravenous contrast. RADIATION DOSE REDUCTION: This exam was performed according to the departmental dose-optimization program which includes automated exposure control, adjustment of the mA and/or kV according to patient size and/or use of iterative reconstruction technique. COMPARISON:  April 26, 2023 FINDINGS: Brain: There is generalized cerebral atrophy with widening of the extra-axial spaces and ventricular dilatation. There are areas of decreased attenuation within the white matter tracts of the supratentorial brain, consistent with microvascular disease changes.  Vascular: Moderate to marked severity bilateral cavernous carotid artery calcification is noted. Skull: Normal. Negative for fracture or focal lesion. Sinuses/Orbits: No acute finding. Other: None. IMPRESSION: 1. Generalized cerebral atrophy with chronic white matter small vessel ischemic changes. 2. No acute intracranial abnormality. Electronically Signed   By: Suzen Dials M.D.   On: 11/13/2023 20:51   DG Ribs Unilateral W/Chest Left Result Date: 11/13/2023 CLINICAL DATA:  Status post fall. EXAM: LEFT RIBS AND CHEST - 3+ VIEW COMPARISON:  April 24, 2023 FINDINGS: An ill-defined fracture of indeterminate age is suspected involving the fifth left rib. Chronic six, seventh and eighth left rib deformities are noted. Postoperative changes are seen within the visualized portion of the cervical spine. There is no evidence of pneumothorax or pleural effusion. Both lungs are clear. Marked severity calcification of the aortic arch is noted. Heart size and mediastinal contours are within normal limits. IMPRESSION: 1. Ill-defined fracture of indeterminate age involving the fifth left rib. Correlation with physical examination is recommended to determine the presence of point tenderness. 2. Chronic sixth, seventh and eighth left rib deformities. Electronically Signed   By: Suzen Dials M.D.   On: 11/13/2023 20:49   DG Foot Complete Left Result Date: 11/13/2023 CLINICAL DATA:  Unwitnessed fall. EXAM: LEFT FOOT - COMPLETE 3+ VIEW COMPARISON:  None Available. FINDINGS: Acute fracture deformities are seen involving the distal aspects of the fourth and fifth left metatarsals. There is no evidence of dislocation. Mild degenerative changes are seen within the mid left foot. A small to moderate sized plantar calcaneal spur is present. Small superficial soft tissue defects and mild soft tissue swelling is seen along the lateral aspect of the distal left foot, adjacent to the previously noted fracture sites. IMPRESSION:  Acute fractures of the distal aspects of the fourth and fifth left metatarsals. Electronically Signed   By: Suzen Dials M.D.   On: 11/13/2023 18:31   Data  Reviewed for HPI: Relevant notes from primary care and specialist visits, past discharge summaries as available in EHR, including Care Everywhere. Prior diagnostic testing as pertinent to current admission diagnoses Updated medications and problem lists for reconciliation ED course, including vitals, labs, imaging, treatment and response to treatment Triage notes, nursing and pharmacy notes and ED provider's notes Notable results as noted above in HPI      Assessment and Plan: * Uncontrolled type 2 diabetes mellitus with hypoglycemia, with long-term current use of insulin  (HCC) Acute metabolic encephalopathy secondary to hypoglycemia Syncope and collapse secondary to hypoglycemia Patient was found semiresponsive on the floor by his sister Hold home Levemir , Humalog, Actos and metformin  Will start D10 with CBG Q 1-2 D50 as needed Hypoglycemia protocol  Atrial fibrillation with RVR (HCC) Improved with diltiazem  IV bolus in the ED Likely driven by syncopal event Continue home metoprolol  and digoxin  for rate control Will continue apixaban  with no evidence of injury related to fall  Fracture of one rib of left side, presumed acute Guaifenesin  Incentive spirometer Pain control  Leukocytosis Suspect reactive has no stigmata of infection Chest x-ray was clear We will get UA and COVID panel to evaluate for possible infection  Closed nondisplaced fracture of fourth and fifth left metatarsal bone Pain right foot Pain control Continuing boot Follow-up right foot x-ray done after admission Consider podiatry consult in the a.m. versus outpatient management    DVT prophylaxis: apixaban   Consults: none  Advance Care Planning:   Code Status: Prior   Family Communication: none  Disposition Plan: Back to previous home  environment  Severity of Illness: The appropriate patient status for this patient is OBSERVATION. Observation status is judged to be reasonable and necessary in order to provide the required intensity of service to ensure the patient's safety. The patient's presenting symptoms, physical exam findings, and initial radiographic and laboratory data in the context of their medical condition is felt to place them at decreased risk for further clinical deterioration. Furthermore, it is anticipated that the patient will be medically stable for discharge from the hospital within 2 midnights of admission.   Author: Delayne LULLA Solian, MD 11/13/2023 11:25 PM  For on call review [email or web address].

## 2023-11-13 NOTE — Assessment & Plan Note (Signed)
 Suspect reactive has no stigmata of infection Chest x-ray was clear We will get UA and COVID panel to evaluate for possible infection

## 2023-11-13 NOTE — Assessment & Plan Note (Addendum)
 Pain right foot Pain control Continuing boot Follow-up right foot x-ray done after admission Consider podiatry consult in the a.m. versus outpatient management

## 2023-11-13 NOTE — ED Triage Notes (Signed)
 Pt to ED via [facility] from home for unwitnessed fall and hypoglycemia. Per EMS pts sister found him in the floor semi-responsive. When FD arrived on scene pts CBG was 43, pt was given 15 g of oral glucose with CBG increasing to 57. EMS reports that pt pt drink 1/2 a cup of juice and ate an oatmeal cream pie with CBG coming up to 60, last CBG 100. Pt did have recent changes to his insulin , EMS reports pt did eat lunch today and did take his insulin .   Pt is unsure if he hit his head when he fell, pt is c/o left foot pain, and has a skin tear on the left elbow. Pt does take blood thinners. Pt is currently A & O and is in NAD.

## 2023-11-13 NOTE — ED Notes (Signed)
 Pt attempted to ambulate with walker while being non weight bearing on his left foot. Pt unable to support his weight with a walker on his right foot. Pt's sister states patient lives alone and is unsteady on his feet on a good day. Pt's sister also expressed concern about patient going home with crutches or a walker, stating that he lives alone.

## 2023-11-13 NOTE — ED Provider Notes (Signed)
 [facility] Provider Note   Event Date/Time   First MD Initiated Contact with Patient 11/13/23 1751     (approximate) History  Fall and Hypoglycemia  HPI Garrett Si. is a 78 y.o. male with a past medical history of of hypertension, hyperlipidemia, persistent atrial fibrillation on Eliquis , and type 2 diabetes who presents after a fall from standing after feeling lightheadedness.  EMS noted patient to be hypoglycemic with sugar of 43 and was given 15 g of oral glucose with sugar increasing to 57.  Patient also had a cup of juice and oatmeal cream pie with improvement of his blood sugar to 100.  Patient states that he has had recent changes to his insulin  and has not had lunch today.  Patient denies hitting his head but does endorse left lateral foot pain and has a skin tear to the left elbow. ROS: Patient currently denies any vision changes, tinnitus, difficulty speaking, facial droop, sore throat, chest pain, shortness of breath, abdominal pain, nausea/vomiting/diarrhea, dysuria, or weakness/numbness/paresthesias in any extremity   Physical Exam  Triage Vital Signs: ED Triage Vitals  Encounter Vitals Group     BP      Girls Systolic BP Percentile      Girls Diastolic BP Percentile      Boys Systolic BP Percentile      Boys Diastolic BP Percentile      Pulse      Resp      Temp      Temp src      SpO2      Weight      Height      Head Circumference      Peak Flow      Pain Score      Pain Loc      Pain Education      Exclude from Growth Chart    Most recent vital signs: Vitals:   11/14/23 1400 11/14/23 1454  BP: (!) 100/58   Pulse: (!) 58   Resp:    Temp:  98 F (36.7 C)  SpO2: 94%    General: Awake, oriented x4. CV:  Good peripheral perfusion. Resp:  Normal effort. Abd:  No distention. Other:  Elderly well-developed, well-nourished Caucasian male resting comfortably in no acute distress.  Small skin tear to the left elbow.  Tenderness  to palpation over the 4th and 5th metatarsal of the left foot ED Results / Procedures / Treatments  Labs (all labs ordered are listed, but only abnormal results are displayed) Labs Reviewed  COMPREHENSIVE METABOLIC PANEL WITH GFR - Abnormal; Notable for the following components:      Result Value   CO2 21 (*)    Glucose, Bld 120 (*)    Creatinine, Ser 1.57 (*)    AST 42 (*)    GFR, Estimated 45 (*)    All other components within normal limits  CBC WITH DIFFERENTIAL/PLATELET - Abnormal; Notable for the following components:   WBC 12.4 (*)    RBC 3.52 (*)    Hemoglobin 10.8 (*)    HCT 34.0 (*)    Platelets 476 (*)    Neutro Abs 10.6 (*)    Abs Immature Granulocytes 0.13 (*)    All other components within normal limits  URINALYSIS, COMPLETE (UACMP) WITH MICROSCOPIC - Abnormal; Notable for the following components:   Color, Urine YELLOW (*)    APPearance HAZY (*)    Glucose, UA 150 (*)  Protein, ur 30 (*)    All other components within normal limits  HEMOGLOBIN A1C - Abnormal; Notable for the following components:   Hgb A1c MFr Bld 6.4 (*)    All other components within normal limits  CBG MONITORING, ED - Abnormal; Notable for the following components:   Glucose-Capillary 126 (*)    All other components within normal limits  CBG MONITORING, ED - Abnormal; Notable for the following components:   Glucose-Capillary 157 (*)    All other components within normal limits  CBG MONITORING, ED - Abnormal; Notable for the following components:   Glucose-Capillary 155 (*)    All other components within normal limits  CBG MONITORING, ED - Abnormal; Notable for the following components:   Glucose-Capillary 151 (*)    All other components within normal limits  CBG MONITORING, ED - Abnormal; Notable for the following components:   Glucose-Capillary 199 (*)    All other components within normal limits  CBG MONITORING, ED - Abnormal; Notable for the following components:    Glucose-Capillary 280 (*)    All other components within normal limits  CBG MONITORING, ED - Abnormal; Notable for the following components:   Glucose-Capillary 306 (*)    All other components within normal limits  RESP PANEL BY RT-PCR (RSV, FLU A&B, COVID)  RVPGX2  CK  TROPONIN I (HIGH SENSITIVITY)  TROPONIN I (HIGH SENSITIVITY)   EKG ED ECG REPORT I, Artist MARLA Kerns, the attending physician, personally viewed and interpreted this ECG. Date: 11/13/2023 EKG Time: 1758 Rate: 130 Rhythm: A-fib with RVR QRS Axis: normal Intervals: normal ST/T Wave abnormalities: normal Narrative Interpretation: A-fib with RVR.  No evidence of acute ischemia RADIOLOGY ED MD interpretation: CT of the head and cervical spine showed no evidence of acute abnormalities X-ray of the left ribs show an ill-defined fracture of indeterminate age involving the fifth left rib X-ray of the left foot shows acute fractures of the distal aspect of the 4th and 5th left metatarsals - All radiology independently interpreted and agree with radiology assessment Official radiology report(s): DG Chest Port 1 View Result Date: 11/14/2023 CLINICAL DATA:  Shortness of breath, atrial fibrillation EXAM: PORTABLE CHEST 1 VIEW COMPARISON:  11/13/2023 FINDINGS: Single frontal view of the chest demonstrates an unremarkable cardiac silhouette. There is pulmonary vascular congestion, with developing basilar predominant interstitial and ground-glass opacities favoring edema. No effusion or pneumothorax. No acute bony abnormalities. IMPRESSION: 1. Findings consistent with mild volume overload and developing pulmonary edema. Electronically Signed   By: Ozell Daring M.D.   On: 11/14/2023 12:20   DG Foot Complete Right Result Date: 11/14/2023 EXAM: 3 OR MORE VIEW(S) XRAY OF THE RIGHT FOOT 11/14/2023 03:32:08 AM COMPARISON: None available. CLINICAL HISTORY: Right foot injury, initial encounter. FINDINGS: BONES AND JOINTS: No acute fracture.  Small inferior and posterior calcaneal spurs. Mild degenerative changes in the midfoot and 1st MTP joint. No joint dislocation. SOFT TISSUES: The soft tissues are unremarkable. IMPRESSION: 1. No acute fracture or dislocation. 2. Mild degenerative changes in the midfoot and first MTP joint. 3. Small inferior and posterior calcaneal spurs. Electronically signed by: Norman Gatlin MD 11/14/2023 03:50 AM EDT RP Workstation: [id number]   CT Cervical Spine Wo Contrast Result Date: 11/13/2023 CLINICAL DATA:  Unwitnessed fall. EXAM: CT CERVICAL SPINE WITHOUT CONTRAST TECHNIQUE: Multidetector CT imaging of the cervical spine was performed without intravenous contrast. Multiplanar CT image reconstructions were also generated. RADIATION DOSE REDUCTION: This exam was performed according to the departmental dose-optimization program which includes  automated exposure control, adjustment of the mA and/or kV according to patient size and/or use of iterative reconstruction technique. COMPARISON:  May 09, 2007 FINDINGS: Alignment: Normal. Skull base and vertebrae: No acute fracture. A metallic density fusion plate and screws are seen along the anterior aspects of the C4, C5, C6 and C7 vertebral bodies. This represents a new finding when compared to the prior exam. Soft tissues and spinal canal: No prevertebral fluid or swelling. No visible canal hematoma. Disc levels: Multilevel endplate sclerosis and postoperative changes consistent with surgical fusion are seen at the levels of C4-C5, C5-C6 and C6-C7. Marked severity posterior bony spurring is also seen at these levels. Bilateral marked severity multilevel facet joint hypertrophy is noted. Upper chest: Negative. Other: A 7 mm benign-appearing well-defined sclerotic focus is seen within the medial aspect of the first left rib (axial CT image 86, CT series 2). This is stable in appearance when compared to the prior chest CT, dated December 18, 2020. IMPRESSION: 1. No acute  fracture or subluxation in the cervical spine. 2. Postoperative changes consistent with prior surgical fusion at the levels of C4-C5, C5-C6 and C6-C7. 3. Marked severity multilevel degenerative changes. Electronically Signed   By: Suzen Dials M.D.   On: 11/13/2023 20:58   CT Head Wo Contrast Result Date: 11/13/2023 CLINICAL DATA:  Unwitnessed fall. EXAM: CT HEAD WITHOUT CONTRAST TECHNIQUE: Contiguous axial images were obtained from the base of the skull through the vertex without intravenous contrast. RADIATION DOSE REDUCTION: This exam was performed according to the departmental dose-optimization program which includes automated exposure control, adjustment of the mA and/or kV according to patient size and/or use of iterative reconstruction technique. COMPARISON:  April 26, 2023 FINDINGS: Brain: There is generalized cerebral atrophy with widening of the extra-axial spaces and ventricular dilatation. There are areas of decreased attenuation within the white matter tracts of the supratentorial brain, consistent with microvascular disease changes. Vascular: Moderate to marked severity bilateral cavernous carotid artery calcification is noted. Skull: Normal. Negative for fracture or focal lesion. Sinuses/Orbits: No acute finding. Other: None. IMPRESSION: 1. Generalized cerebral atrophy with chronic white matter small vessel ischemic changes. 2. No acute intracranial abnormality. Electronically Signed   By: Suzen Dials M.D.   On: 11/13/2023 20:51   DG Ribs Unilateral W/Chest Left Result Date: 11/13/2023 CLINICAL DATA:  Status post fall. EXAM: LEFT RIBS AND CHEST - 3+ VIEW COMPARISON:  April 24, 2023 FINDINGS: An ill-defined fracture of indeterminate age is suspected involving the fifth left rib. Chronic six, seventh and eighth left rib deformities are noted. Postoperative changes are seen within the visualized portion of the cervical spine. There is no evidence of pneumothorax or pleural effusion. Both  lungs are clear. Marked severity calcification of the aortic arch is noted. Heart size and mediastinal contours are within normal limits. IMPRESSION: 1. Ill-defined fracture of indeterminate age involving the fifth left rib. Correlation with physical examination is recommended to determine the presence of point tenderness. 2. Chronic sixth, seventh and eighth left rib deformities. Electronically Signed   By: Suzen Dials M.D.   On: 11/13/2023 20:49   DG Foot Complete Left Result Date: 11/13/2023 CLINICAL DATA:  Unwitnessed fall. EXAM: LEFT FOOT - COMPLETE 3+ VIEW COMPARISON:  None Available. FINDINGS: Acute fracture deformities are seen involving the distal aspects of the fourth and fifth left metatarsals. There is no evidence of dislocation. Mild degenerative changes are seen within the mid left foot. A small to moderate sized plantar calcaneal spur is present. Small  superficial soft tissue defects and mild soft tissue swelling is seen along the lateral aspect of the distal left foot, adjacent to the previously noted fracture sites. IMPRESSION: Acute fractures of the distal aspects of the fourth and fifth left metatarsals. Electronically Signed   By: Suzen Dials M.D.   On: 11/13/2023 18:31   PROCEDURES: Critical Care performed: Yes, see critical care procedure note(s) CRITICAL CARE Performed by: Emeterio Balke K Maitland Muhlbauer   Total critical care time: 41 minutes  Critical care time was exclusive of separately billable procedures and treating other patients.  Critical care was necessary to treat or prevent imminent or life-threatening deterioration.  Critical care was time spent personally by me on the following activities: development of treatment plan with patient and/or surrogate as well as nursing, discussions with consultants, evaluation of patient's response to treatment, examination of patient, obtaining history from patient or surrogate, ordering and performing treatments and interventions,  ordering and review of laboratory studies, ordering and review of radiographic studies, pulse oximetry and re-evaluation of patient's condition.  SABRA1-3 Lead EKG Interpretation  Performed by: Jossie Artist POUR, MD Authorized by: Jossie Artist POUR, MD     Interpretation: abnormal     ECG rate:  114   ECG rate assessment: tachycardic     Rhythm: atrial fibrillation     Ectopy: none     Conduction: normal    MEDICATIONS ORDERED IN ED: Medications  oxyCODONE  (Oxy IR/ROXICODONE ) immediate release tablet 5 mg (5 mg Oral Given 11/13/23 2054)  atorvastatin  (LIPITOR) tablet 40 mg (40 mg Oral Given 11/14/23 1012)  digoxin  (LANOXIN ) tablet 62.5 mcg (62.5 mcg Oral Given 11/14/23 1018)  metoprolol  succinate (TOPROL -XL) 24 hr tablet 100 mg (100 mg Oral Given 11/14/23 1012)  tamsulosin  (FLOMAX ) capsule 0.4 mg (0.4 mg Oral Given 11/14/23 1013)  apixaban  (ELIQUIS ) tablet 5 mg (5 mg Oral Given 11/14/23 1013)  sodium chloride  flush (NS) 0.9 % injection 3 mL (3 mLs Intravenous Not Given 11/14/23 1017)  acetaminophen  (TYLENOL ) tablet 650 mg (has no administration in time range)    Or  acetaminophen  (TYLENOL ) suppository 650 mg (has no administration in time range)  HYDROcodone -acetaminophen  (NORCO/VICODIN) 5-325 MG per tablet 1-2 tablet (has no administration in time range)  morphine  (PF) 2 MG/ML injection 2 mg (has no administration in time range)  ondansetron  (ZOFRAN ) tablet 4 mg (has no administration in time range)    Or  ondansetron  (ZOFRAN ) injection 4 mg (has no administration in time range)  guaiFENesin  (MUCINEX ) 12 hr tablet 600 mg (600 mg Oral Given 11/14/23 1012)  insulin  aspart (novoLOG ) injection 0-15 Units (11 Units Subcutaneous Given 11/14/23 1140)  lidocaine  (LIDODERM ) 5 % 1 patch (0 patches Transdermal Hold 11/14/23 1013)  Chlorhexidine  Gluconate Cloth 2 % PADS 6 each (0 each Topical Hold 11/14/23 1501)  furosemide  (LASIX ) injection 40 mg (40 mg Intravenous Given 11/14/23 1457)  diltiazem  (CARDIZEM  CD)  24 hr capsule 120 mg (120 mg Oral Given 11/14/23 1457)  sodium chloride  0.9 % bolus 1,000 mL (0 mLs Intravenous Stopped 11/13/23 2000)  diltiazem  (CARDIZEM ) injection 10 mg (10 mg Intravenous Given 11/13/23 1846)  acetaminophen  (TYLENOL ) tablet 1,000 mg (1,000 mg Oral Given 11/13/23 2053)  diltiazem  (CARDIZEM ) tablet 30 mg (30 mg Oral Given 11/13/23 2206)  diltiazem  (CARDIZEM ) 1 mg/mL load via infusion 10 mg (10 mg Intravenous Bolus from Bag 11/14/23 1001)   IMPRESSION / MDM / ASSESSMENT AND PLAN / ED COURSE  I reviewed the triage vital signs and the nursing notes.  The patient is on the cardiac monitor to evaluate for evidence of arrhythmia and/or significant heart rate changes. Patient's presentation is most consistent with acute presentation with potential threat to life or bodily function. Patient is a 78 year old male with the above-stated past medical history that presents after patient had an unwitnessed fall and was found to be hypoglycemic as well as in A-fib with RVR. DDx: Hypoglycemia, atrial fibrillation with rapid ventricular response, ACS, intracranial hemorrhage Plan: CBC, CMP, CBG, troponin, x-ray of the left foot, EKG  Upon reassessment, patient now complaining of left-sided chest wall pain.  Chest x-ray focusing on the ribs on this left side shows a left fifth rib fracture that correlates to patient's tenderness to palpation on this left side.  Patient also has fracture of the left 4th and 5th metatarsal.  Patient was placed in a surgical shoe however he was unable to walk with crutches or a walker.  Patient initially responded to IV and oral diltiazem  however patient's heart rate began to uptrend again and will start diltiazem  drip.  I spoke to Dr. Cleatus on the hospitalist service was agreed to accept this patient for further evaluation and management  Dispo: Admit to medicine   FINAL CLINICAL IMPRESSION(S) / ED DIAGNOSES   Final diagnoses:  Fall,  initial encounter  Hypoglycemia  Atrial fibrillation with rapid ventricular response (HCC)  Syncope, unspecified syncope type  Closed nondisplaced fracture of fourth metatarsal bone of left foot, initial encounter  Closed nondisplaced fracture of fifth metatarsal bone of left foot, initial encounter  Closed fracture of one rib of left side, initial encounter   Rx / DC Orders   ED Discharge Orders     None      Note:  This document was prepared using Dragon voice recognition software and may include unintentional dictation errors.   Jossie Artist POUR, MD 11/14/23 [phone]

## 2023-11-13 NOTE — Assessment & Plan Note (Addendum)
 Guaifenesin  Incentive spirometer Pain control

## 2023-11-13 NOTE — Assessment & Plan Note (Signed)
 Improved with diltiazem  IV bolus in the ED Likely driven by syncopal event Continue home metoprolol  and digoxin  for rate control Will continue apixaban  with no evidence of injury related to fall

## 2023-11-14 ENCOUNTER — Observation Stay

## 2023-11-14 DIAGNOSIS — S92355A Nondisplaced fracture of fifth metatarsal bone, left foot, initial encounter for closed fracture: Secondary | ICD-10-CM | POA: Diagnosis not present

## 2023-11-14 DIAGNOSIS — G9341 Metabolic encephalopathy: Secondary | ICD-10-CM

## 2023-11-14 DIAGNOSIS — J9601 Acute respiratory failure with hypoxia: Secondary | ICD-10-CM | POA: Diagnosis not present

## 2023-11-14 DIAGNOSIS — I4891 Unspecified atrial fibrillation: Secondary | ICD-10-CM

## 2023-11-14 DIAGNOSIS — I4821 Permanent atrial fibrillation: Secondary | ICD-10-CM

## 2023-11-14 DIAGNOSIS — S92345A Nondisplaced fracture of fourth metatarsal bone, left foot, initial encounter for closed fracture: Secondary | ICD-10-CM | POA: Diagnosis not present

## 2023-11-14 DIAGNOSIS — R338 Other retention of urine: Secondary | ICD-10-CM | POA: Diagnosis present

## 2023-11-14 DIAGNOSIS — J811 Chronic pulmonary edema: Secondary | ICD-10-CM | POA: Diagnosis present

## 2023-11-14 LAB — CK: Total CK: 325 U/L (ref 49–397)

## 2023-11-14 LAB — URINALYSIS, COMPLETE (UACMP) WITH MICROSCOPIC
Bacteria, UA: NONE SEEN
Bilirubin Urine: NEGATIVE
Glucose, UA: 150 mg/dL — AB
Hgb urine dipstick: NEGATIVE
Ketones, ur: NEGATIVE mg/dL
Leukocytes,Ua: NEGATIVE
Nitrite: NEGATIVE
Protein, ur: 30 mg/dL — AB
Specific Gravity, Urine: 1.025 (ref 1.005–1.030)
pH: 5 (ref 5.0–8.0)

## 2023-11-14 LAB — RESP PANEL BY RT-PCR (RSV, FLU A&B, COVID)  RVPGX2
Influenza A by PCR: NEGATIVE
Influenza B by PCR: NEGATIVE
Resp Syncytial Virus by PCR: NEGATIVE
SARS Coronavirus 2 by RT PCR: NEGATIVE

## 2023-11-14 LAB — CBG MONITORING, ED
Glucose-Capillary: 151 mg/dL — ABNORMAL HIGH (ref 70–99)
Glucose-Capillary: 155 mg/dL — ABNORMAL HIGH (ref 70–99)
Glucose-Capillary: 186 mg/dL — ABNORMAL HIGH (ref 70–99)
Glucose-Capillary: 199 mg/dL — ABNORMAL HIGH (ref 70–99)
Glucose-Capillary: 280 mg/dL — ABNORMAL HIGH (ref 70–99)
Glucose-Capillary: 306 mg/dL — ABNORMAL HIGH (ref 70–99)

## 2023-11-14 LAB — HEMOGLOBIN A1C
Hgb A1c MFr Bld: 6.4 % — ABNORMAL HIGH (ref 4.8–5.6)
Mean Plasma Glucose: 136.98 mg/dL

## 2023-11-14 LAB — GLUCOSE, CAPILLARY: Glucose-Capillary: 182 mg/dL — ABNORMAL HIGH (ref 70–99)

## 2023-11-14 LAB — TROPONIN I (HIGH SENSITIVITY): Troponin I (High Sensitivity): 13 ng/L (ref <18)

## 2023-11-14 MED ORDER — INSULIN ASPART 100 UNIT/ML IJ SOLN
0.0000 [IU] | Freq: Three times a day (TID) | INTRAMUSCULAR | Status: DC
  Administered 2023-11-14: 3 [IU] via SUBCUTANEOUS
  Administered 2023-11-14: 11 [IU] via SUBCUTANEOUS
  Administered 2023-11-15: 8 [IU] via SUBCUTANEOUS
  Administered 2023-11-15 (×2): 5 [IU] via SUBCUTANEOUS
  Administered 2023-11-16 (×2): 8 [IU] via SUBCUTANEOUS
  Administered 2023-11-16 – 2023-11-17 (×2): 5 [IU] via SUBCUTANEOUS
  Administered 2023-11-17: 15 [IU] via SUBCUTANEOUS
  Administered 2023-11-18 (×2): 8 [IU] via SUBCUTANEOUS
  Filled 2023-11-14 (×9): qty 1
  Filled 2023-11-14: qty 11
  Filled 2023-11-14 (×2): qty 1

## 2023-11-14 MED ORDER — SODIUM CHLORIDE 0.9 % IV SOLN
INTRAVENOUS | Status: DC
  Filled 2023-11-14 (×2): qty 100

## 2023-11-14 MED ORDER — FUROSEMIDE 10 MG/ML IJ SOLN
40.0000 mg | Freq: Every day | INTRAMUSCULAR | Status: DC
Start: 2023-11-14 — End: 2023-11-16
  Administered 2023-11-14 – 2023-11-16 (×3): 40 mg via INTRAVENOUS
  Filled 2023-11-14 (×3): qty 4

## 2023-11-14 MED ORDER — DILTIAZEM HCL-DEXTROSE 125-5 MG/125ML-% IV SOLN (PREMIX)
5.0000 mg/h | INTRAVENOUS | Status: DC
  Filled 2023-11-14: qty 125

## 2023-11-14 MED ORDER — DILTIAZEM HCL-SODIUM CHLORIDE 125-0.7 MG/125ML-% IV SOLN: INTRAVENOUS | Status: DC

## 2023-11-14 MED ORDER — CHLORHEXIDINE GLUCONATE CLOTH 2 % EX PADS
6.0000 | MEDICATED_PAD | Freq: Every day | CUTANEOUS | Status: DC
  Administered 2023-11-15 – 2023-11-17 (×3): 6 via TOPICAL
  Filled 2023-11-14: qty 6

## 2023-11-14 MED ORDER — DILTIAZEM HCL-SODIUM CHLORIDE 125-0.9 MG/125ML-% IV SOLN
INTRAVENOUS | Status: DC
Start: 2023-11-14 — End: 2023-11-14

## 2023-11-14 MED ORDER — LIDOCAINE 5 % EX PTCH
1.0000 | MEDICATED_PATCH | CUTANEOUS | Status: DC
  Administered 2023-11-15 – 2023-11-18 (×3): 1 via TRANSDERMAL
  Filled 2023-11-14 (×5): qty 1

## 2023-11-14 MED ORDER — DILTIAZEM LOAD VIA INFUSION
10.0000 mg | Freq: Once | INTRAVENOUS | Status: AC
Start: 2023-11-14 — End: 2023-11-14
  Administered 2023-11-14: 10 mg via INTRAVENOUS
  Filled 2023-11-14: qty 10

## 2023-11-14 MED ORDER — DILTIAZEM HCL ER COATED BEADS 120 MG PO CP24
120.0000 mg | ORAL_CAPSULE | Freq: Every day | ORAL | Status: DC
  Administered 2023-11-14 – 2023-11-18 (×5): 120 mg via ORAL
  Filled 2023-11-14 (×5): qty 1

## 2023-11-14 NOTE — ED Notes (Signed)
 Pt placed on 3L [place] due to O2 sitting around 88-91%.

## 2023-11-14 NOTE — Consult Note (Addendum)
 FOOT & ANKLE SURGICAL CONSULTATION  Date Time: 11/14/23 11:44 AM Patient Name: Garrett Yoder.  Requesting Physician: Lanetta Lingo, MD Consulting Physician: Ethan LITTIE Saddler   Reason for Consultation:  Left foot 4th and 5th metatarsal neck fracture Right ankle sprain.  History:  Garrett Yoder. is a 78 y.o. male with medical history significant for Permanent A-fib on Eliquis , DM, HTN, BPH, CKD lllb, being admitted with A-fib with RVR and  symptomatic hypoglycemia/encephalopathy resulting in a fall with left metatarsal fractures.  Patient was found on the floor by his sister, semiresponsive.  Patient is now awake and alert, recently had his insulin  dose decreased by his endocrinologist who he saw on 9/23.  He had apparently been running hypoglycemic between 10 PM and 2 AM based on blood sugars uploaded from his Dexcom.  Today he said he was getting up from a chair and the next thing he was out.  He otherwise denies any recent illnesses, no vomiting or diarrhea.  No change in oral intake. On arrival of EMS CBG was 43 improving to 57 with oral glucose.  ,100 by arrival.  He reports recent changes in his insulin .  On arrival in the ED he was noted to be in rapid A-fib with heart rate in the 150s.  BP initially 161/85, but trended down to 104/86 during workup.,  Labs were notable for WBC of 12,000 Hemoglobin 10.8 (baseline 11-11.6), creatinine 1.57 near baseline, blood sugar 120. UA not done EKG showing A-fib at 130 Trauma imaging which included CT head and C-spine left rib series and x-ray foot significant for age-indeterminate fifth left rib fracture and acute fractures 4th and 5th left metatarsals  At bedside patient reports that the left foot pain is under good control.  He reports some swelling in this area.  Reports most of his pain is around the right lateral ankle.  He has not been ambulatory since being at the hospital.  He does have postop shoe applied to the left foot.  Past  Medical History:   Past Medical History:  Diagnosis Date   Anemia 08/14/2015   Anxiety    Aortic atherosclerosis 11/25/2016   Chest CT Oct 2018   Aorto-iliac atherosclerosis 11/25/2016   Abd/pelvic CT Oct 2018   Coronary artery calcification seen on CAT scan 11/25/2016   Chest CT Oct 2018; refer to Dr. Darron   DDD (degenerative disc disease), cervical 08/21/2016   Depression    Diabetes mellitus without complication (HCC)    Facet arthropathy, cervical 08/21/2016   GERD (gastroesophageal reflux disease)    Heart murmur    As child   History of echocardiogram    a. 01/2022 Echo: EF 50-55%, no rwma, nl RV fxn w/ RVSP 34.80mmHg, Mildly dil RA. Mild MR. Mod TR. AoV sclerosis w/o stenosis.   History of stress test    a. 01/2017 Ex MV: Poor ex capacity - held in stage I. 1mm Inflat ST depression. No ischemia/infarct. EF 55-65%.   Hyperlipidemia    Hypertension    Hypertrophy of prostate with urinary obstruction 11/25/2016   Pelvic CT October 2018   Incidental lung nodule, > 3mm and < 8mm 11/25/2016   5 mm nodule LLL, chest CT Oct 2018   Persistent atrial fibrillation (HCC)    a. 02/2022 Dx in 01/2022-->CHA2DS2VASc = 5-->Eliquis ; b. 02/2022 s/p DCCV (150J->200J->RSR).   Thrombocytosis 08/14/2015    Past Surgical History:   Past Surgical History:  Procedure Laterality Date   ANTERIOR CERVICAL DECOMP/DISCECTOMY  FUSION N/A 09/24/2016   Procedure: ANTERIOR CERVICAL DECOMPRESSION/DISCECTOMY FUSION 3 LEVELS-C4-7;  Surgeon: Clois Fret, MD;  Location: [facility];  Service: Neurosurgery;  Laterality: N/A;   CARDIOVERSION N/A 02/27/2022   Procedure: CARDIOVERSION;  Surgeon: Perla Evalene PARAS, MD;  Location: [facility];  Service: Cardiovascular;  Laterality: N/A;   CATARACT EXTRACTION     ELECTROMAGNETIC NAVIGATION BROCHOSCOPY Right 04/07/2018   Procedure: ELECTROMAGNETIC NAVIGATION BRONCHOSCOPY;  Surgeon: Isaiah Scrivener, MD;  Location: [facility];  Service: Cardiopulmonary;  Laterality: Right;    EYE SURGERY Right    Cataract Extraction with IOL   RETINAL LASER PROCEDURE      Family History:   Family History  Problem Relation Age of Onset   Stroke Mother    Diabetes Mellitus II Father    Heart Problems Father    Heart disease Father    Diabetes Sister    Cancer Maternal Grandfather    Stroke Paternal Grandmother    Prostate cancer Neg Hx    Bladder Cancer Neg Hx    Kidney cancer Neg Hx     Social History:   Social History   Socioeconomic History   Marital status: Widowed    Spouse name: Dagoberto   Number of children: Not on file   Years of education: Not on file   Highest education level: Not on file  Occupational History   Occupation: retired  Tobacco Use   Smoking status: Former    Current packs/day: 0.00    Average packs/day: 1 pack/day for 30.0 years (30.0 ttl pk-yrs)    Types: Cigarettes    Start date: 07/18/1969    Quit date: 07/19/1999    Years since quitting: 24.3   Smokeless tobacco: Never  Vaping Use   Vaping status: Never Used  Substance and Sexual Activity   Alcohol use: No    Alcohol/week: 0.0 standard drinks of alcohol   Drug use: No   Sexual activity: Yes    Partners: Female  Other Topics Concern   Not on file  Social History Narrative   Not on file   Social Drivers of Health   Financial Resource Strain: Low Risk  (10/15/2023)   Overall Financial Resource Strain (CARDIA)    Difficulty of Paying Living Expenses: Not hard at all  Food Insecurity: No Food Insecurity (10/15/2023)   Hunger Vital Sign    Worried About Running Out of Food in the Last Year: Never true    Ran Out of Food in the Last Year: Never true  Transportation Needs: No Transportation Needs (10/15/2023)   PRAPARE - Administrator, Civil Service (Medical): No    Lack of Transportation (Non-Medical): No  Physical Activity: Insufficiently Active (10/15/2023)   Exercise Vital Sign    Days of Exercise per Week: 7 days    Minutes of Exercise per Session: 20 min   Stress: No Stress Concern Present (10/15/2023)   [employer] of Occupational Health - Occupational Stress Questionnaire    Feeling of Stress: Not at all  Social Connections: Moderately Isolated (10/15/2023)   Social Connection and Isolation Panel    Frequency of Communication with Friends and Family: Twice a week    Frequency of Social Gatherings with Friends and Family: Twice a week    Attends Religious Services: More than 4 times per year    Active Member of [employer] or Organizations: No    Attends Banker Meetings: Never    Marital Status: Widowed  Intimate Partner Violence: Not At Risk (  10/15/2023)   Humiliation, Afraid, Rape, and Kick questionnaire    Fear of Current or Ex-Partner: No    Emotionally Abused: No    Physically Abused: No    Sexually Abused: No     Allergies:   Allergies  Allergen Reactions   Codeine Nausea Only    Other reaction(s): makes him sick to his stomach Other reaction(s): makes him sick to his stomach    Medications:   Prior to Admission medications   Medication Sig Start Date End Date Taking? Authorizing Provider  LANTUS  SOLOSTAR 100 UNIT/ML Solostar Pen Inject 15 Units into the skin at bedtime. 11/10/23  Yes [provider]  apixaban  (ELIQUIS ) 5 MG TABS tablet TAKE 1 TABLET BY MOUTH TWICE  DAILY 07/30/23   Darron Deatrice LABOR, MD  atorvastatin  (LIPITOR) 40 MG tablet TAKE 1 TABLET BY MOUTH DAILY 10/08/23   Pender, Julie F, FNP  bimatoprost (LUMIGAN) 0.01 % SOLN Place 1 drop into both eyes at bedtime.     [provider]  digoxin  (LANOXIN ) 0.125 MG tablet TAKE 1/2 TABLET BY MOUTH EVERY DAY 08/05/23   Riddle, Suzann, NP  insulin  detemir (LEVEMIR  FLEXTOUCH) 100 UNIT/ML FlexPen Inject 30 Units into the skin every evening. 09/02/19   [provider]  insulin  lispro (HUMALOG) 100 UNIT/ML KwikPen Inject into the skin daily. Pt taking 4 units in AM, 12 units at noon 03/09/19   [provider]  metFORMIN   (GLUCOPHAGE ) 1000 MG tablet Take 1,000 mg by mouth 2 (two) times daily with a meal. 04/02/21   [provider]  metoprolol  succinate (TOPROL -XL) 100 MG 24 hr tablet TAKE 1 TABLET BY MOUTH  DAILY . TAKE WITH OR IMMEDIATELY FOLLOWING A MEAL 11/13/22   Riddle, Suzann, NP  Multiple Vitamin (MULTI-VITAMINS) TABS Take 1 tablet by mouth 2 (two) times daily.     [provider]  pioglitazone (ACTOS) 15 MG tablet Take 15 mg by mouth daily.    [provider]  SIMBRINZA 1-0.2 % SUSP Place 1 drop into both eyes 3 (three) times daily.     [provider]  tamsulosin  (FLOMAX ) 0.4 MG CAPS capsule TAKE 1 CAPSULE BY MOUTH DAILY 03/27/23   [name], FNP    Review of Systems:  Review of Systems   Physical Exam:   Temp:  [97.8 F (36.6 C)-98.3 F (36.8 C)] 98.3 F (36.8 C) (09/27 1012) Pulse Rate:  [69-118] 108 (09/27 1130) Resp:  [10-26] 20 (09/27 1130) BP: (97-161)/(49-124) 149/73 (09/27 1100) SpO2:  [87 %-100 %] 92 % (09/27 1130) Weight:  [61.2 kg] 61.2 kg (09/26 1758)      Intake/Output Summary (Last 24 hours) at 11/14/2023 1144 Last data filed at 11/14/2023 0530 Gross per 24 hour  Intake --  Output 575 ml  Net -575 ml     General: AAOx3, NAD Heart: RRR, normal rate Lungs: no rales, no wheezing, no abnormalities Abdomen: non-tender, non-distended  Lower Extremity Exam Vasc: DP and PT pulses palpable 2/4 bilaterally.  Cap refill 3 seconds to digits.  Decreased pedal hair growth.  Telangiectasias present.  Derm: No open wounds or lesions noted.  Normal skin texture and skin turgor.  MSK:  Left foot tenderness on palpation of 4th and 5th metatarsal heads.  Localized edema present to the left forefoot.  No significant ecchymosis.  Right ankle tenderness on palpation of ATFL, anterior lateral ankle gutter, peroneal tendons.  No pain on palpation of fibula.  No significant increase in anterior drawer sign.  No  pain with syndesmotic squeeze.  No pain on  palpation fifth met base.  Neuro: Light touch sensation intact bilaterally.  Gross motor function intact. Labs Reviewed:  Labs (last 72 hours):  Recent Labs  Lab 11/13/23 1756  WBC 12.4*  HGB 10.8*  HCT 34.0*  PLT 476*    No results for input(s): INR, PTT in the last 168 hours.  Invalid input(s): PT Recent Labs  Lab 11/13/23 1756  NA 139  K 4.1  CL 105  CO2 21*  BUN 21  PROT 7.1  ALKPHOS 111  ALT 39  AST 42*            Rads:  Left foot radiographs 3 views: Mildly comminuted, mildly displaced fractures of 4th and 5th metatarsal neck.  No significant sagittal plane displacement.  There is some mild lateral angulation of the metatarsal heads without dislocation or subluxation of the metatarsophalangeal joints present.  Right foot radiographs 3 views: No acute fracture or dislocation seen.  There is calcaneal spurring present.  First MPJ joint space narrowing present.  No evidence of acute fracture seen to the fibula or fifth met base. Assessment:  78 y.o. male with past medical history of A-fib, type 2 diabetes, hypertension, BPH, CKD 3B was admitted for symptomatic hypoglycemia/encephalopathy.  1.  Mildly displaced left foot 4th and 5th metatarsal neck fracture 2.  Right ankle sprain  Plan:  - Patient seen at bedside.  Reviewed clinical findings with patient - 4th and 5th metatarsal fractures can be managed nonoperatively.  Does have postop shoe.  Do recommend short cam walker boot.  He may weight-bear as tolerated using the boot but should favor heel - Findings consistent with right lateral ankle sprain.  Would benefit from ankle brace if available. - Reviewed RICE therapy with patient -Today applied a Jones compressive bandage to the left lower extremity with two layers of cast padding and two layers of compressive ace bandage for edema control and compressive Ace wrap to the right ankle. -Pain control per primary team -Recommend outpatient follow-up with  [facility] office within 1 week of discharge. - Thank you for the consult.   Ethan LITTIE Saddler, DPM

## 2023-11-14 NOTE — Consult Note (Addendum)
 Cardiology Consultation   Patient ID: Garrett Yoder. MRN: [id number]; DOB: November 06, 1945  Admit date: 11/13/2023 Date of Consult: 11/14/2023  PCP:  [name], FNP   [facility] Providers Cardiologist:  Deatrice Cage, MD  Electrophysiologist:  OLE ONEIDA HOLTS, MD  Electrophysiology APP:  Riddle, Suzann, NP       Patient Profile: Garrett Dupee. is a 78 y.o. male with a hx of permanent atrial fibrillation, hypertension, and CAD who is being seen 11/14/2023 for the evaluation of A-fib RVR at the request of Dr. Cleatus.  History of Present Illness: Garrett Yoder is a 78 year old male with history of hypertension, permanent A-fib presenting with dizziness and fall, being seen for A-fib RVR.  Patient states feeling dizzy at home yesterday, states new his blood sugar was low as this has happened in the past.  He describes standing needing to fall.  He was evaluated by his sister who called EMS and patient brought to the ED.  Blood sugar was noted to be 43 upon EMS arrival.  Was given some glucose per EMS.  Blood sugar on admission 126.  States his insulin  was recently reduced by his endocrinologist.  Endorsed pain in both of his legs due to the fall.  In the ED upon admission, EKG showed healthy heart rate 130, x-ray showed left fracture of 4th and 5th digits.  Was given pain medications with improvement in symptoms.  Compliant with Eliquis , Toprol , digoxin  as prescribed.   Heart rates upon my exam improved, in the 70s on telemetry.   Past Medical History:  Diagnosis Date   Anemia 08/14/2015   Anxiety    Aortic atherosclerosis 11/25/2016   Chest CT Oct 2018   Aorto-iliac atherosclerosis 11/25/2016   Abd/pelvic CT Oct 2018   Coronary artery calcification seen on CAT scan 11/25/2016   Chest CT Oct 2018; refer to Dr. Cage   DDD (degenerative disc disease), cervical 08/21/2016   Depression    Diabetes mellitus without complication (HCC)    Facet arthropathy,  cervical 08/21/2016   GERD (gastroesophageal reflux disease)    Heart murmur    As child   History of echocardiogram    a. 01/2022 Echo: EF 50-55%, no rwma, nl RV fxn w/ RVSP 34.43mmHg, Mildly dil RA. Mild MR. Mod TR. AoV sclerosis w/o stenosis.   History of stress test    a. 01/2017 Ex MV: Poor ex capacity - held in stage I. 1mm Inflat ST depression. No ischemia/infarct. EF 55-65%.   Hyperlipidemia    Hypertension    Hypertrophy of prostate with urinary obstruction 11/25/2016   Pelvic CT October 2018   Incidental lung nodule, > 3mm and < 8mm 11/25/2016   5 mm nodule LLL, chest CT Oct 2018   Persistent atrial fibrillation (HCC)    a. 02/2022 Dx in 01/2022-->CHA2DS2VASc = 5-->Eliquis ; b. 02/2022 s/p DCCV (150J->200J->RSR).   Thrombocytosis 08/14/2015    Past Surgical History:  Procedure Laterality Date   ANTERIOR CERVICAL DECOMP/DISCECTOMY FUSION N/A 09/24/2016   Procedure: ANTERIOR CERVICAL DECOMPRESSION/DISCECTOMY FUSION 3 LEVELS-C4-7;  Surgeon: Clois Fret, MD;  Location: [facility] ORS;  Service: Neurosurgery;  Laterality: N/A;   CARDIOVERSION N/A 02/27/2022   Procedure: CARDIOVERSION;  Surgeon: Perla Evalene PARAS, MD;  Location: [facility];  Service: Cardiovascular;  Laterality: N/A;   CATARACT EXTRACTION     ELECTROMAGNETIC NAVIGATION BROCHOSCOPY Right 04/07/2018   Procedure: ELECTROMAGNETIC NAVIGATION BRONCHOSCOPY;  Surgeon: Isaiah Scrivener, MD;  Location: [facility];  Service: Cardiopulmonary;  Laterality: Right;  EYE SURGERY Right    Cataract Extraction with IOL   RETINAL LASER PROCEDURE       Home Medications:  Prior to Admission medications   Medication Sig Start Date End Date Taking? Authorizing Provider  apixaban  (ELIQUIS ) 5 MG TABS tablet TAKE 1 TABLET BY MOUTH TWICE  DAILY 07/30/23  Yes Darron Deatrice LABOR, MD  atorvastatin  (LIPITOR) 40 MG tablet TAKE 1 TABLET BY MOUTH DAILY 10/08/23  Yes Pender, Julie F, FNP  bimatoprost (LUMIGAN) 0.01 % SOLN Place 1 drop into both eyes at  bedtime.    Yes [provider]  digoxin  (LANOXIN ) 0.125 MG tablet TAKE 1/2 TABLET BY MOUTH EVERY DAY 08/05/23  Yes Riddle, Suzann, NP  insulin  lispro (HUMALOG) 100 UNIT/ML KwikPen Inject into the skin daily. Pt taking 4 units in AM, 12 units at noon 03/09/19  Yes [provider]  LANTUS  SOLOSTAR 100 UNIT/ML Solostar Pen Inject 15 Units into the skin at bedtime. 11/10/23  Yes [provider]  metFORMIN  (GLUCOPHAGE ) 1000 MG tablet Take 1,000 mg by mouth 2 (two) times daily with a meal. 04/02/21  Yes [provider]  metoprolol  succinate (TOPROL -XL) 100 MG 24 hr tablet TAKE 1 TABLET BY MOUTH  DAILY . TAKE WITH OR IMMEDIATELY FOLLOWING A MEAL 11/13/22  Yes Riddle, Suzann, NP  Multiple Vitamin (MULTI-VITAMINS) TABS Take 1 tablet by mouth 2 (two) times daily.    Yes [provider]  pioglitazone (ACTOS) 15 MG tablet Take 15 mg by mouth daily.   Yes [provider]  tamsulosin  (FLOMAX ) 0.4 MG CAPS capsule TAKE 1 CAPSULE BY MOUTH DAILY 03/27/23  Yes Pender, Julie F, FNP  SIMBRINZA 1-0.2 % SUSP Place 1 drop into both eyes 3 (three) times daily.  Patient not taking: Reported on 11/14/2023    [provider]    Scheduled Meds:  apixaban   5 mg Oral BID   atorvastatin   40 mg Oral Daily   Chlorhexidine  Gluconate Cloth  6 each Topical Daily   digoxin   62.5 mcg Oral Daily   diltiazem   120 mg Oral Daily   furosemide   40 mg Intravenous Daily   guaiFENesin   600 mg Oral BID   insulin  aspart  0-15 Units Subcutaneous TID WC   lidocaine   1 patch Transdermal Q24H   metoprolol  succinate  100 mg Oral Daily   sodium chloride  flush  3 mL Intravenous Q12H   tamsulosin   0.4 mg Oral Daily   Continuous Infusions:  PRN Meds: acetaminophen  **OR** acetaminophen , HYDROcodone -acetaminophen , morphine  injection, ondansetron  **OR** ondansetron  (ZOFRAN ) IV, oxyCODONE   Allergies:    Allergies  Allergen Reactions   Codeine Nausea Only    Other reaction(s): makes him  sick to his stomach Other reaction(s): makes him sick to his stomach    Social History:   Social History   Socioeconomic History   Marital status: Widowed    Spouse name: Dagoberto   Number of children: Not on file   Years of education: Not on file   Highest education level: Not on file  Occupational History   Occupation: retired  Tobacco Use   Smoking status: Former    Current packs/day: 0.00    Average packs/day: 1 pack/day for 30.0 years (30.0 ttl pk-yrs)    Types: Cigarettes    Start date: 07/18/1969    Quit date: 07/19/1999    Years since quitting: 24.3   Smokeless tobacco: Never  Vaping Use   Vaping status: Never Used  Substance and Sexual Activity   Alcohol use:  No    Alcohol/week: 0.0 standard drinks of alcohol   Drug use: No   Sexual activity: Yes    Partners: Female  Other Topics Concern   Not on file  Social History Narrative   Not on file   Social Drivers of Health   Financial Resource Strain: Low Risk  (10/15/2023)   Overall Financial Resource Strain (CARDIA)    Difficulty of Paying Living Expenses: Not hard at all  Food Insecurity: No Food Insecurity (10/15/2023)   Hunger Vital Sign    Worried About Running Out of Food in the Last Year: Never true    Ran Out of Food in the Last Year: Never true  Transportation Needs: No Transportation Needs (10/15/2023)   PRAPARE - Administrator, Civil Service (Medical): No    Lack of Transportation (Non-Medical): No  Physical Activity: Insufficiently Active (10/15/2023)   Exercise Vital Sign    Days of Exercise per Week: 7 days    Minutes of Exercise per Session: 20 min  Stress: No Stress Concern Present (10/15/2023)   [employer] of Occupational Health - Occupational Stress Questionnaire    Feeling of Stress: Not at all  Social Connections: Moderately Isolated (10/15/2023)   Social Connection and Isolation Panel    Frequency of Communication with Friends and Family: Twice a week    Frequency of Social  Gatherings with Friends and Family: Twice a week    Attends Religious Services: More than 4 times per year    Active Member of [employer] or Organizations: No    Attends Banker Meetings: Never    Marital Status: Widowed  Intimate Partner Violence: Not At Risk (10/15/2023)   Humiliation, Afraid, Rape, and Kick questionnaire    Fear of Current or Ex-Partner: No    Emotionally Abused: No    Physically Abused: No    Sexually Abused: No    Family History:    Family History  Problem Relation Age of Onset   Stroke Mother    Diabetes Mellitus II Father    Heart Problems Father    Heart disease Father    Diabetes Sister    Cancer Maternal Grandfather    Stroke Paternal Grandmother    Prostate cancer Neg Hx    Bladder Cancer Neg Hx    Kidney cancer Neg Hx      ROS:  Please see the history of present illness.   All other ROS reviewed and negative.     Physical Exam/Data: Vitals:   11/14/23 1351 11/14/23 1400 11/14/23 1454 11/14/23 1500  BP: 138/79 (!) 100/58  (!) 115/54  Pulse: 74 (!) 58  67  Resp: 20     Temp:   98 F (36.7 C)   TempSrc:   Oral   SpO2: 95% 94%  95%  Weight:      Height:        Intake/Output Summary (Last 24 hours) at 11/14/2023 1608 Last data filed at 11/14/2023 0530 Gross per 24 hour  Intake --  Output 575 ml  Net -575 ml      11/13/2023    5:58 PM 09/11/2023   11:11 AM 07/06/2023    1:03 PM  Last 3 Weights  Weight (lbs) 135 lb 134 lb 12.8 oz 138 lb 9.6 oz  Weight (kg) 61.236 kg 61.145 kg 62.869 kg     Body mass index is 20.53 kg/m.  General:  Well nourished, well developed, in no acute distress HEENT: normal Neck: no JVD  Vascular: No carotid bruits; Distal pulses 2+ bilaterally Cardiac: Irregularly irregular Lungs:  clear to auscultation bilaterally, no wheezing, rhonchi or rales  Abd: soft, nontender, no hepatomegaly  Ext: no edema Musculoskeletal: Bilateral foot tenderness, BUE and BLE strength normal and equal Skin: warm and dry   Neuro:  CNs 2-12 intact, no focal abnormalities noted Psych:  Normal affect   EKG:  The EKG was personally reviewed and demonstrates: Atrial fibrillation heart rate 130 Telemetry:  Telemetry was personally reviewed and demonstrates: Atrial fibrillation heart rate 87  Relevant CV Studies: TTE 3/25 EF 55 to 60%  Laboratory Data: High Sensitivity Troponin:   Recent Labs  Lab 11/13/23 1756 11/14/23 0946  TROPONINIHS 8 13     Chemistry Recent Labs  Lab 11/13/23 1756  NA 139  K 4.1  CL 105  CO2 21*  GLUCOSE 120*  BUN 21  CREATININE 1.57*  CALCIUM  9.0  GFRNONAA 45*  ANIONGAP 13    Recent Labs  Lab 11/13/23 1756  PROT 7.1  ALBUMIN 4.0  AST 42*  ALT 39  ALKPHOS 111  BILITOT 0.5   Lipids No results for input(s): CHOL, TRIG, HDL, LABVLDL, LDLCALC, CHOLHDL in the last 168 hours.  Hematology Recent Labs  Lab 11/13/23 1756  WBC 12.4*  RBC 3.52*  HGB 10.8*  HCT 34.0*  MCV 96.6  MCH 30.7  MCHC 31.8  RDW 13.9  PLT 476*   Thyroid  No results for input(s): TSH, FREET4 in the last 168 hours.  BNPNo results for input(s): BNP, PROBNP in the last 168 hours.  DDimer No results for input(s): DDIMER in the last 168 hours.  Radiology/Studies:  DG Chest Port 1 View Result Date: 11/14/2023 CLINICAL DATA:  Shortness of breath, atrial fibrillation EXAM: PORTABLE CHEST 1 VIEW COMPARISON:  11/13/2023 FINDINGS: Single frontal view of the chest demonstrates an unremarkable cardiac silhouette. There is pulmonary vascular congestion, with developing basilar predominant interstitial and ground-glass opacities favoring edema. No effusion or pneumothorax. No acute bony abnormalities. IMPRESSION: 1. Findings consistent with mild volume overload and developing pulmonary edema. Electronically Signed   By: Ozell Daring M.D.   On: 11/14/2023 12:20   DG Foot Complete Right Result Date: 11/14/2023 EXAM: 3 OR MORE VIEW(S) XRAY OF THE RIGHT FOOT 11/14/2023 03:32:08 AM  COMPARISON: None available. CLINICAL HISTORY: Right foot injury, initial encounter. FINDINGS: BONES AND JOINTS: No acute fracture. Small inferior and posterior calcaneal spurs. Mild degenerative changes in the midfoot and 1st MTP joint. No joint dislocation. SOFT TISSUES: The soft tissues are unremarkable. IMPRESSION: 1. No acute fracture or dislocation. 2. Mild degenerative changes in the midfoot and first MTP joint. 3. Small inferior and posterior calcaneal spurs. Electronically signed by: Norman Gatlin MD 11/14/2023 03:50 AM EDT RP Workstation: [id number]   CT Cervical Spine Wo Contrast Result Date: 11/13/2023 CLINICAL DATA:  Unwitnessed fall. EXAM: CT CERVICAL SPINE WITHOUT CONTRAST TECHNIQUE: Multidetector CT imaging of the cervical spine was performed without intravenous contrast. Multiplanar CT image reconstructions were also generated. RADIATION DOSE REDUCTION: This exam was performed according to the departmental dose-optimization program which includes automated exposure control, adjustment of the mA and/or kV according to patient size and/or use of iterative reconstruction technique. COMPARISON:  May 09, 2007 FINDINGS: Alignment: Normal. Skull base and vertebrae: No acute fracture. A metallic density fusion plate and screws are seen along the anterior aspects of the C4, C5, C6 and C7 vertebral bodies. This represents a new finding when compared to the prior exam. Soft tissues and spinal canal:  No prevertebral fluid or swelling. No visible canal hematoma. Disc levels: Multilevel endplate sclerosis and postoperative changes consistent with surgical fusion are seen at the levels of C4-C5, C5-C6 and C6-C7. Marked severity posterior bony spurring is also seen at these levels. Bilateral marked severity multilevel facet joint hypertrophy is noted. Upper chest: Negative. Other: A 7 mm benign-appearing well-defined sclerotic focus is seen within the medial aspect of the first left rib (axial CT image 86, CT  series 2). This is stable in appearance when compared to the prior chest CT, dated December 18, 2020. IMPRESSION: 1. No acute fracture or subluxation in the cervical spine. 2. Postoperative changes consistent with prior surgical fusion at the levels of C4-C5, C5-C6 and C6-C7. 3. Marked severity multilevel degenerative changes. Electronically Signed   By: Suzen Dials M.D.   On: 11/13/2023 20:58   CT Head Wo Contrast Result Date: 11/13/2023 CLINICAL DATA:  Unwitnessed fall. EXAM: CT HEAD WITHOUT CONTRAST TECHNIQUE: Contiguous axial images were obtained from the base of the skull through the vertex without intravenous contrast. RADIATION DOSE REDUCTION: This exam was performed according to the departmental dose-optimization program which includes automated exposure control, adjustment of the mA and/or kV according to patient size and/or use of iterative reconstruction technique. COMPARISON:  April 26, 2023 FINDINGS: Brain: There is generalized cerebral atrophy with widening of the extra-axial spaces and ventricular dilatation. There are areas of decreased attenuation within the white matter tracts of the supratentorial brain, consistent with microvascular disease changes. Vascular: Moderate to marked severity bilateral cavernous carotid artery calcification is noted. Skull: Normal. Negative for fracture or focal lesion. Sinuses/Orbits: No acute finding. Other: None. IMPRESSION: 1. Generalized cerebral atrophy with chronic white matter small vessel ischemic changes. 2. No acute intracranial abnormality. Electronically Signed   By: Suzen Dials M.D.   On: 11/13/2023 20:51   DG Ribs Unilateral W/Chest Left Result Date: 11/13/2023 CLINICAL DATA:  Status post fall. EXAM: LEFT RIBS AND CHEST - 3+ VIEW COMPARISON:  April 24, 2023 FINDINGS: An ill-defined fracture of indeterminate age is suspected involving the fifth left rib. Chronic six, seventh and eighth left rib deformities are noted. Postoperative changes  are seen within the visualized portion of the cervical spine. There is no evidence of pneumothorax or pleural effusion. Both lungs are clear. Marked severity calcification of the aortic arch is noted. Heart size and mediastinal contours are within normal limits. IMPRESSION: 1. Ill-defined fracture of indeterminate age involving the fifth left rib. Correlation with physical examination is recommended to determine the presence of point tenderness. 2. Chronic sixth, seventh and eighth left rib deformities. Electronically Signed   By: Suzen Dials M.D.   On: 11/13/2023 20:49   DG Foot Complete Left Result Date: 11/13/2023 CLINICAL DATA:  Unwitnessed fall. EXAM: LEFT FOOT - COMPLETE 3+ VIEW COMPARISON:  None Available. FINDINGS: Acute fracture deformities are seen involving the distal aspects of the fourth and fifth left metatarsals. There is no evidence of dislocation. Mild degenerative changes are seen within the mid left foot. A small to moderate sized plantar calcaneal spur is present. Small superficial soft tissue defects and mild soft tissue swelling is seen along the lateral aspect of the distal left foot, adjacent to the previously noted fracture sites. IMPRESSION: Acute fractures of the distal aspects of the fourth and fifth left metatarsals. Electronically Signed   By: Suzen Dials M.D.   On: 11/13/2023 18:31     Assessment and Plan: Permanent A-fib RVR - Tachycardia as a result of acute  pain/fall leading to metatarsal fracture - Heart rates now improved with better pain control. - Continue Toprol -XL 100 mg daily, digoxin  0.065 mg daily, Eliquis  5 mg twice daily. - Last echo with normal EF  Fall, metatarsal fracture -Adequate diabetes/blood sugar management to prevent hypoglycemia and fall. -Continue pain management -Management as per primary team.  No additional cardiac testing indicated.  Continue current cardiac meds, close follow-up with cardiology as outpatient.  Please let us   know if additional cardiac input is needed.  Redell Cave, MD  11/14/2023 4:08 PM

## 2023-11-14 NOTE — Progress Notes (Signed)
 Progress Note   Patient: Garrett Yoder. FMW:[id number] DOB: 08-06-1945 DOA: 11/13/2023     0 DOS: the patient was seen and examined on 11/14/2023   Brief hospital course:  Garrett Follett. is a 78 y.o. male with medical history significant for Permanent A-fib on Eliquis , DM, HTN, BPH, CKD lllb, being admitted with A-fib with RVR and  symptomatic hypoglycemia/encephalopathy resulting in a fall with left metatarsal fractures.  Patient was found on the floor by his sister, semiresponsive.  Patient is now awake and alert, recently had his insulin  dose decreased by his endocrinologist who he saw on 9/23.  He had apparently been running hypoglycemic between 10 PM and 2 AM based on blood sugars uploaded from his Dexcom.  Today he said he was getting up from a chair and the next thing he was out.  He otherwise denies any recent illnesses, no vomiting or diarrhea.  No change in oral intake. On arrival of EMS CBG was 43 improving to 57 with oral glucose.  ,100 by arrival.  He reports recent changes in his insulin .  On arrival in the ED he was noted to be in rapid A-fib with heart rate in the 150s.  BP initially 161/85, but trended down to 104/86 during workup.,  Labs were notable for WBC of 12,000 Hemoglobin 10.8 (baseline 11-11.6), creatinine 1.57 near baseline, blood sugar 120. UA not done EKG showing A-fib at 130 Trauma imaging which included CT head and C-spine left rib series and x-ray foot significant for age-indeterminate fifth left rib fracture and acute fractures 4th and 5th left metatarsals   Patient was treated with an NS bolus, diltiazem  IV 10 mg with improvement in heart rate to the 90s by admission he was also given oral Cardizem  30 mg. He was placed in a boot and given oxycodone  for pain.   Admission requested.   Assessment and Plan:  Atrial fibrillation with rapid ventricular rate Patient with a known history of A-fib Received a one-time dose of IV diltiazem  in the ER with  initial improvement in his heart rate and then had a recurrence of RVR Start patient on Cardizem  drip Continue Eliquis  as primary prophylaxis Continue Eliquis  as primary prophylaxis for an acute stroke Continue metoprolol  and digoxin  Cardiology consult   Acute hypoxic respiratory failure Noted to have room air pulse oximetry of 88% with shortness of breath requiring oxygen supplementation to maintain pulse oximetry greater than 92% Chest x-ray showed findings suggestive of pulmonary edema Continue oxygen supplementation will attempt to wean once acute illness improves or resolves   Acute pulmonary edema Acute diastolic dysfunction CHF Related to tachycardia Last 2D echocardiogram from 03/25 shows an LVEF of 55 to 60% with normal LV function Optimize rate control Start patient on Lasix  40 mg IV daily    Uncontrolled type 2 diabetes mellitus with hypoglycemia, with long-term current use of insulin  (HCC) Acute metabolic encephalopathy secondary to hypoglycemia Syncope and collapse secondary to hypoglycemia Patient was found semiresponsive on the floor by his sister Was hypoglycemic upon presentation Started on dextrose  infusion with improvement in his blood sugars.  Dextrose  infusion has been discontinued Patient currently hyperglycemic Hemoglobin A1c is 6.4. May only require oral agents on discharge Will resume sliding scale coverage to optimize glycemic control at this time.      Fracture of one rib of left side, presumed acute Guaifenesin  Incentive spirometer Pain control   Leukocytosis Suspect reactive has no stigmata of infection Chest x-ray was clear We will get  UA and COVID panel to evaluate for possible infection   Closed nondisplaced fracture of fourth and fifth left metatarsal bone Pain right foot Pain control Appreciate podiatry input Recommends non operative treatment for the left foot.  Recommend patient use a walking boot however and can weight bear as  tolerated, applied a jones compressive bandage to the left lower extremity, and an acewrap for support to the right ankle PT eval    Acute urinary retention Patient with difficulty voiding and abdominal pain Bladder scan yielded 500 mL of urine Foley catheter insertion        Subjective: Appears uncomfortable.  Sister at the bedside  Physical Exam: Vitals:   11/14/23 1346 11/14/23 1351 11/14/23 1400 11/14/23 1454  BP:  138/79 (!) 100/58   Pulse: 83 74 (!) 58   Resp:  20    Temp:    98 F (36.7 C)  TempSrc:    Oral  SpO2: 97% 95% 94%   Weight:      Height:       Vitals and nursing note reviewed.  Constitutional:      General: He is not in acute distress.  Hard of hearing HENT:     Head: Normocephalic and atraumatic.  Cardiovascular:     Rate and Rhythm: Irregularly irregular, tachycardic    Heart sounds: Normal heart sounds.  Pulmonary:     Effort: Pulmonary effort is normal.     Breath sounds: Normal breath sounds.  Abdominal:     Palpations: Bowel sounds present, firm    Tenderness: suprapubic tenderness Musculoskeletal:     Comments: Left foot in boot, pain on range of motion right ankle  Neurological:     Mental Status: Mental status is at baseline.      Data Reviewed: Total CK3 25, hemoglobin A1c 6.4. Labs reviewed  Family Communication: Plan of care discussed with patient's sister at the bedside.  All questions and concerns have been addressed.  She verbalizes understanding and agrees with the plan.  Disposition: Status is: Observation The patient remains OBS appropriate and will d/c before 2 midnights.  Planned Discharge Destination: TBD    Time spent: 60 minutes  Author: Aimee Somerset, MD 11/14/2023 3:19 PM  For on call review [email or web address].

## 2023-11-14 NOTE — ED Notes (Signed)
 Pt bladder scanned, found. Dr Lanetta informed.

## 2023-11-14 NOTE — Hospital Course (Signed)
 SABRA

## 2023-11-14 NOTE — ED Notes (Signed)
 PT called RN and stated that he feels like he had to pee and could not do it with legs elevated in bed. Rnx2 and ED Tech assisted pt to bedside commode. Pt HR elevated to 175 BPM A-fib. Pt denied any chest pain or shortness of breath. Pt stated that he was unable to urinate in Bedside commode. RN bladder scanned pt and there was in his bladder. Provider notified.

## 2023-11-14 NOTE — ED Notes (Signed)
 Pt asking if could sit on bedside commode to see if it would help with urination. Pt complaining of pain after catheter inserted earlier. Pt transitioned to [facility] by this RN. Pt sat 5 min, stated it was to uncomfortable and placed back in bed.

## 2023-11-15 DIAGNOSIS — N1832 Chronic kidney disease, stage 3b: Secondary | ICD-10-CM | POA: Diagnosis present

## 2023-11-15 DIAGNOSIS — Z79899 Other long term (current) drug therapy: Secondary | ICD-10-CM | POA: Diagnosis not present

## 2023-11-15 DIAGNOSIS — Z87891 Personal history of nicotine dependence: Secondary | ICD-10-CM | POA: Diagnosis not present

## 2023-11-15 DIAGNOSIS — Z7901 Long term (current) use of anticoagulants: Secondary | ICD-10-CM | POA: Diagnosis not present

## 2023-11-15 DIAGNOSIS — S2232XA Fracture of one rib, left side, initial encounter for closed fracture: Secondary | ICD-10-CM | POA: Diagnosis present

## 2023-11-15 DIAGNOSIS — J9601 Acute respiratory failure with hypoxia: Secondary | ICD-10-CM | POA: Diagnosis present

## 2023-11-15 DIAGNOSIS — S92355A Nondisplaced fracture of fifth metatarsal bone, left foot, initial encounter for closed fracture: Secondary | ICD-10-CM | POA: Diagnosis present

## 2023-11-15 DIAGNOSIS — I130 Hypertensive heart and chronic kidney disease with heart failure and stage 1 through stage 4 chronic kidney disease, or unspecified chronic kidney disease: Secondary | ICD-10-CM | POA: Diagnosis present

## 2023-11-15 DIAGNOSIS — I4821 Permanent atrial fibrillation: Secondary | ICD-10-CM | POA: Diagnosis present

## 2023-11-15 DIAGNOSIS — S51012A Laceration without foreign body of left elbow, initial encounter: Secondary | ICD-10-CM | POA: Diagnosis present

## 2023-11-15 DIAGNOSIS — Z833 Family history of diabetes mellitus: Secondary | ICD-10-CM | POA: Diagnosis not present

## 2023-11-15 DIAGNOSIS — S92345A Nondisplaced fracture of fourth metatarsal bone, left foot, initial encounter for closed fracture: Secondary | ICD-10-CM | POA: Diagnosis present

## 2023-11-15 DIAGNOSIS — Z7984 Long term (current) use of oral hypoglycemic drugs: Secondary | ICD-10-CM | POA: Diagnosis not present

## 2023-11-15 DIAGNOSIS — W1830XA Fall on same level, unspecified, initial encounter: Secondary | ICD-10-CM | POA: Diagnosis present

## 2023-11-15 DIAGNOSIS — N401 Enlarged prostate with lower urinary tract symptoms: Secondary | ICD-10-CM | POA: Diagnosis present

## 2023-11-15 DIAGNOSIS — I4892 Unspecified atrial flutter: Secondary | ICD-10-CM | POA: Diagnosis present

## 2023-11-15 DIAGNOSIS — I2510 Atherosclerotic heart disease of native coronary artery without angina pectoris: Secondary | ICD-10-CM | POA: Diagnosis present

## 2023-11-15 DIAGNOSIS — D72828 Other elevated white blood cell count: Secondary | ICD-10-CM | POA: Diagnosis present

## 2023-11-15 DIAGNOSIS — E785 Hyperlipidemia, unspecified: Secondary | ICD-10-CM | POA: Diagnosis present

## 2023-11-15 DIAGNOSIS — I5031 Acute diastolic (congestive) heart failure: Secondary | ICD-10-CM | POA: Diagnosis present

## 2023-11-15 DIAGNOSIS — E1122 Type 2 diabetes mellitus with diabetic chronic kidney disease: Secondary | ICD-10-CM | POA: Diagnosis present

## 2023-11-15 DIAGNOSIS — Z794 Long term (current) use of insulin: Secondary | ICD-10-CM | POA: Diagnosis not present

## 2023-11-15 DIAGNOSIS — G9341 Metabolic encephalopathy: Secondary | ICD-10-CM | POA: Diagnosis present

## 2023-11-15 DIAGNOSIS — E11649 Type 2 diabetes mellitus with hypoglycemia without coma: Secondary | ICD-10-CM | POA: Diagnosis present

## 2023-11-15 DIAGNOSIS — E162 Hypoglycemia, unspecified: Secondary | ICD-10-CM | POA: Diagnosis present

## 2023-11-15 DIAGNOSIS — Z8249 Family history of ischemic heart disease and other diseases of the circulatory system: Secondary | ICD-10-CM | POA: Diagnosis not present

## 2023-11-15 LAB — BASIC METABOLIC PANEL WITH GFR
Anion gap: 16 — ABNORMAL HIGH (ref 5–15)
BUN: 23 mg/dL (ref 8–23)
CO2: 22 mmol/L (ref 22–32)
Calcium: 9.2 mg/dL (ref 8.9–10.3)
Chloride: 102 mmol/L (ref 98–111)
Creatinine, Ser: 1.44 mg/dL — ABNORMAL HIGH (ref 0.61–1.24)
GFR, Estimated: 50 mL/min — ABNORMAL LOW (ref >60)
Glucose, Bld: 196 mg/dL — ABNORMAL HIGH (ref 70–99)
Potassium: 3.7 mmol/L (ref 3.5–5.1)
Sodium: 140 mmol/L (ref 135–145)

## 2023-11-15 LAB — MAGNESIUM: Magnesium: 1.9 mg/dL (ref 1.7–2.4)

## 2023-11-15 LAB — CBC
HCT: 34.7 % — ABNORMAL LOW (ref 39.0–52.0)
Hemoglobin: 11.2 g/dL — ABNORMAL LOW (ref 13.0–17.0)
MCH: 30.3 pg (ref 26.0–34.0)
MCHC: 32.3 g/dL (ref 30.0–36.0)
MCV: 93.8 fL (ref 80.0–100.0)
Platelets: 539 K/uL — ABNORMAL HIGH (ref 150–400)
RBC: 3.7 MIL/uL — ABNORMAL LOW (ref 4.22–5.81)
RDW: 13.9 % (ref 11.5–15.5)
WBC: 14.1 K/uL — ABNORMAL HIGH (ref 4.0–10.5)
nRBC: 0 % (ref 0.0–0.2)

## 2023-11-15 LAB — GLUCOSE, CAPILLARY
Glucose-Capillary: 217 mg/dL — ABNORMAL HIGH (ref 70–99)
Glucose-Capillary: 203 mg/dL — ABNORMAL HIGH (ref 70–99)
Glucose-Capillary: 256 mg/dL — ABNORMAL HIGH (ref 70–99)
Glucose-Capillary: 225 mg/dL — ABNORMAL HIGH (ref 70–99)

## 2023-11-15 LAB — DIGOXIN LEVEL: Digoxin Level: 1.2 ng/mL (ref 0.8–2.0)

## 2023-11-15 MED ORDER — POTASSIUM CHLORIDE CRYS ER 20 MEQ PO TBCR
40.0000 meq | EXTENDED_RELEASE_TABLET | Freq: Once | ORAL | Status: AC
  Administered 2023-11-15: 40 meq via ORAL
  Filled 2023-11-15: qty 2

## 2023-11-15 NOTE — TOC Initial Note (Addendum)
 Transition of Care (TOC) - Initial/Assessment Note    Patient Details  Name: Garrett Yoder. MRN: [id number] Date of Birth: 1945/03/18  Transition of Care [facility]) CM/SW Contact:    Edsel DELENA Fischer, LCSW Phone Number: 11/15/2023, 6:10 PM  Clinical Narrative:                 TOC met with pt at beside.  Pt stated that son lives 40 miles away and will be providing transport to rehab facility.  TOC asked pt if he had preference of rehab facility. Pt stated said [facility]. TOC asked if pt would like TOC to reach out to local and surrounding rehabs for placement if [facility] does not accept pt. Pt said yes.  DME: Cane, walker, and possibly a wheelchair.  Pt stated that he still drives.  Pt denies fininical concerns or issues paying for medications.  Veteran: no.  Pt stated he is a diabetic.   FL2 completed and submitted to preference and other SNF  Expected Discharge Plan: Skilled Nursing Facility Barriers to Discharge: Continued Medical Work up   Patient Goals and CMS Choice Patient states their goals for this hospitalization and ongoing recovery are:: rehab and then return home          Expected Discharge Plan and Services In-house Referral: Clinical Social Work     Living arrangements for the past 2 months: Single Family Home                                      Prior Living Arrangements/Services Living arrangements for the past 2 months: Single Family Home Lives with:: Pets, Self Patient language and need for interpreter reviewed:: Yes Do you feel safe going back to the place where you live?: Yes (after rehab)            Criminal Activity/Legal Involvement Pertinent to Current Situation/Hospitalization: No - Comment as needed  Activities of Daily Living      Permission Sought/Granted   Permission granted to share information with : Yes, Verbal Permission Granted  Share Information with NAME: Boyd Buffalo (son)           Emotional  Assessment Appearance:: Appears stated age Attitude/Demeanor/Rapport: Engaged, Gracious Affect (typically observed): Accepting, Appropriate Orientation: : Oriented to Self, Oriented to Place, Oriented to  Time   Psych Involvement: No (comment)  Admission diagnosis:  Hypoglycemia [E16.2] Atrial fibrillation with rapid ventricular response (HCC) [I48.91] Atrial flutter with rapid ventricular response (HCC) [I48.92] Closed nondisplaced fracture of fourth metatarsal bone of left foot, initial encounter [S92.345A] Fall, initial encounter [W19.XXXA] Closed fracture of one rib of left side, initial encounter [S22.32XA] Syncope, unspecified syncope type [R55] Closed nondisplaced fracture of fifth metatarsal bone of left foot, initial encounter [S92.355A] Patient Active Problem List   Diagnosis Date Noted   Atrial flutter with rapid ventricular response (HCC) 11/15/2023   Acute urinary retention 11/14/2023   Acute hypoxic respiratory failure (HCC) 11/14/2023   Pulmonary edema 11/14/2023   Atrial fibrillation with rapid ventricular response (HCC) 11/14/2023   Uncontrolled type 2 diabetes mellitus with hypoglycemia, with long-term current use of insulin  (HCC) 11/13/2023   Syncope and collapse 11/13/2023   Closed nondisplaced fracture of fourth and fifth left metatarsal bone 11/13/2023   Leukocytosis 11/13/2023   Fracture of one rib of left side, presumed acute 11/13/2023   SIRS (systemic inflammatory response syndrome) (HCC) 04/26/2023   Atrial fibrillation with  RVR (HCC) 04/26/2023   Acute metabolic encephalopathy 04/26/2023   Persistent atrial fibrillation (HCC) 02/27/2022   Chronic kidney disease, stage 3b (HCC) 08/16/2020   Glaucoma of both eyes 11/29/2018   Benign prostatic hyperplasia with nocturia 11/29/2018   Abnormal findings on diagnostic imaging of lung 11/29/2018   Type 2 diabetes mellitus with hyperglycemia, with long-term current use of insulin  (HCC) 05/28/2018   Lung nodule     Exposure to cigarette smoke 03/09/2018   Aortic atherosclerosis 11/25/2016   Coronary artery calcification seen on CAT scan 11/25/2016   Incidental lung nodule, > 3mm and < 8mm 11/25/2016   Aorto-iliac atherosclerosis 11/25/2016   Hypertrophy of prostate with urinary obstruction 11/25/2016   Abnormal computed tomography of bladder 11/25/2016   DDD (degenerative disc disease), cervical 08/21/2016   Facet arthropathy, cervical 08/21/2016   De Quervain's tenosynovitis 08/21/2016   Acid reflux 09/09/2015   Essential hypertension, benign 08/14/2015   Hyperlipidemia 08/14/2015   Anemia 08/14/2015   Essential (hemorrhagic) thrombocythemia (HCC) 08/14/2015   PCP:  [name], FNP Pharmacy:   [facility]) - [place], [place] - [address] [facility] [address] [place] [address] [place] [address] Phone: [phone] Fax: [phone]  [facility] Delivery) - [place], [place] - [address] [place] [address] [place] [address] Phone: [phone] Fax: [phone]  [facility] - [place], [place] - [address] [place] [address] Phone: [phone] Fax: [phone]  [facility] Delivery - [place], [place] - [address] [place] [address] Phone: [phone] Fax: [phone]  [facility] [address] [place] [address] Phone: [phone] Fax: [phone]     Social Drivers of Health (SDOH) Social History: SDOH Screenings   Food Insecurity: No Food Insecurity (11/15/2023)  Housing: Low Risk  (11/15/2023)  Transportation Needs: No Transportation Needs (11/15/2023)  Utilities: Not At Risk (11/15/2023)  Alcohol Screen: Low Risk  (10/15/2023)  Depression (PHQ2-9): Low Risk  (10/15/2023)  Financial Resource Strain: Low Risk  (10/15/2023)  Physical Activity: Insufficiently Active  (10/15/2023)  Social Connections: Moderately Isolated (11/15/2023)  Stress: No Stress Concern Present (10/15/2023)  Tobacco Use: Medium Risk (11/13/2023)  Health Literacy: Adequate Health Literacy (10/15/2023)   SDOH Interventions:     Readmission Risk Interventions     No data to display

## 2023-11-15 NOTE — Plan of Care (Signed)
  Problem: Education: Goal: Ability to describe self-care measures that may prevent or decrease complications (Diabetes Survival Skills Education) will improve Outcome: Progressing Goal: Individualized Educational Video(s) Outcome: Progressing   Problem: Coping: Goal: Ability to adjust to condition or change in health will improve Outcome: Progressing   Problem: Fluid Volume: Goal: Ability to maintain a balanced intake and output will improve Outcome: Progressing   Problem: Health Behavior/Discharge Planning: Goal: Ability to identify and utilize available resources and services will improve Outcome: Progressing Goal: Ability to manage health-related needs will improve Outcome: Progressing   Problem: Metabolic: Goal: Ability to maintain appropriate glucose levels will improve Outcome: Progressing   Problem: Nutritional: Goal: Maintenance of adequate nutrition will improve Outcome: Progressing Goal: Progress toward achieving an optimal weight will improve Outcome: Progressing   Problem: Skin Integrity: Goal: Risk for impaired skin integrity will decrease Outcome: Progressing   Problem: Tissue Perfusion: Goal: Adequacy of tissue perfusion will improve Outcome: Progressing   Problem: Education: Goal: Knowledge of condition and prescribed therapy will improve Outcome: Progressing   Problem: Cardiac: Goal: Will achieve and/or maintain adequate cardiac output Outcome: Progressing   Problem: Physical Regulation: Goal: Complications related to the disease process, condition or treatment will be avoided or minimized Outcome: Progressing   Problem: Education: Goal: Knowledge of General Education information will improve Description: Including pain rating scale, medication(s)/side effects and non-pharmacologic comfort measures Outcome: Progressing   Problem: Health Behavior/Discharge Planning: Goal: Ability to manage health-related needs will improve Outcome:  Progressing   Problem: Clinical Measurements: Goal: Ability to maintain clinical measurements within normal limits will improve Outcome: Progressing Goal: Will remain free from infection Outcome: Progressing Goal: Diagnostic test results will improve Outcome: Progressing Goal: Respiratory complications will improve Outcome: Progressing Goal: Cardiovascular complication will be avoided Outcome: Progressing   Problem: Activity: Goal: Risk for activity intolerance will decrease Outcome: Progressing   Problem: Nutrition: Goal: Adequate nutrition will be maintained Outcome: Progressing   Problem: Coping: Goal: Level of anxiety will decrease Outcome: Progressing   Problem: Elimination: Goal: Will not experience complications related to bowel motility Outcome: Progressing Goal: Will not experience complications related to urinary retention Outcome: Progressing   Problem: Pain Managment: Goal: General experience of comfort will improve and/or be controlled Outcome: Progressing   Problem: Safety: Goal: Ability to remain free from injury will improve Outcome: Progressing   Problem: Skin Integrity: Goal: Risk for impaired skin integrity will decrease Outcome: Progressing

## 2023-11-15 NOTE — Evaluation (Signed)
 Physical Therapy Evaluation Patient Details Name: Garrett Yoder. MRN: [id number] DOB: 08-Mar-1945 Today's Date: 11/15/2023  History of Present Illness  Pt admitted to [facility] on 11/13/23 under observation for c/o mechanical fall and hypoglycemia. Admitted for further workup of Afib with RVR and symptomatic acute metabolic encephalopathy. Imaging significant for age indeterminate L 5th rib fx, R ankle sprain, distal 4th/5th L met fx, and mild volume overload and developing pulmonary edema. Significant PMH includes: HTN, HLD, Afib (Eliquis ), T2DM, BPH, CKD (3b).   Clinical Impression  Pt received in supine with son at bedside, and is agreeable for PT eval. At baseline, pt is IND with ADL's, IADL's, ambulation without AD, medication management, and driving. Denies hx of falls other than reason for admission, and denies feeling unsteady on his feet.   Pt presents with increased pain levels, decreased standing balance, functional weakness, decreased activity tolerance, and tachycardia, resulting in impaired functional mobility from baseline. Due to deficits, pt required supervision for bed mobility, minA for transfers in RW, and minA to ambulate 32ft EOB>recliner with RW. Pt currently requiring max multimodal cues for safety, sequencing, RW management, and WB precautions during functional mobility for correct performance of task and decreased fall risk. Tachycardia up to 140's while seated EOB improved with box-breathing at rest, but was again elevated to 136bpm with minimal upright mobility.  Deficits limit the pt's ability to safely and independently perform ADL's, transfer, and ambulate. Pt will benefit from acute skilled PT services to address deficits for return to baseline function. Pt will benefit from post acute therapy services to address deficits for return to baseline function.         If plan is discharge home, recommend the following: A little help with walking and/or transfers;A little help  with bathing/dressing/bathroom;Assistance with cooking/housework;Assist for transportation;Help with stairs or ramp for entrance   Can travel by private vehicle   Yes    Equipment Recommendations  (defer to post acute)  Recommendations for Other Services       Functional Status Assessment Patient has had a recent decline in their functional status and demonstrates the ability to make significant improvements in function in a reasonable and predictable amount of time.     Precautions / Restrictions Precautions Precautions: Fall Recall of Precautions/Restrictions: Intact Restrictions Weight Bearing Restrictions Per Provider Order: Yes RLE Weight Bearing Per Provider Order: Weight bearing as tolerated LLE Weight Bearing Per Provider Order:  (can WBAT but prefer HWB in CAM boot per podiatry) Other Position/Activity Restrictions: ankle brace RLE; CAM boot LLE      Mobility  Bed Mobility               General bed mobility comments: supervision for safety to sit EOB, HOB flat, use of BUE for support, increased time/effort    Transfers                   General transfer comment: minA for power to stand from EOB with RW, max multimodal cues for safety, sequencing, hand placement, and WB precautions. minA for controlled descent to sit in recliner, max multimodal cues for safety, sequencing, hand placement, WB precautions, and RW proximity. demo's poor eccentric lowering.    Ambulation/Gait   Gait Distance (Feet): 2 Feet           General Gait Details: minA for balance, RW management, and step by step sequencing to ambulate short distance from EOB>recliner.    Balance Overall balance assessment: Needs assistance Sitting-balance support: Feet  supported, No upper extremity supported Sitting balance-Leahy Scale: Good Sitting balance - Comments: able to lean forward to assist PT with donning of CAM boot   Standing balance support: During functional activity,  Reliant on assistive device for balance Standing balance-Leahy Scale: Fair Standing balance comment: minA for balance in RW                             Pertinent Vitals/Pain Pain Assessment Pain Assessment: Faces Faces Pain Scale: Hurts a little bit Pain Location: L foot Pain Descriptors / Indicators: Aching Pain Intervention(s): Monitored during session, Limited activity within patient's tolerance, Repositioned    Home Living Family/patient expects to be discharged to:: Private residence Living Arrangements: Alone Available Help at Discharge: Family;Available PRN/intermittently (son, Ozell, lives away) Type of Home: House Home Access: Stairs to enter Entrance Stairs-Rails: None Entrance Stairs-Number of Steps: 2   Home Layout: One level Home Equipment: Rollator (4 wheels);Cane - single point;Grab bars - tub/shower;Shower seat - built in;Hand held shower head Additional Comments: cannot recall if he has a RW    Prior Function               Mobility Comments: IND without AD; no hx of falls other than reason for admission, denies feeling unsteady ADLs Comments: IND with ADL's, IADL's, medication management, and driving     Extremity/Trunk Assessment   Upper Extremity Assessment Upper Extremity Assessment: Overall WFL for tasks assessed    Lower Extremity Assessment Lower Extremity Assessment: Overall WFL for tasks assessed (bil ankle NT secondary to sprain and fx's. c/o N/T to bil toes)       Communication   Communication Communication: No apparent difficulties Factors Affecting Communication: Hearing impaired    Cognition Arousal: Alert Behavior During Therapy: WFL for tasks assessed/performed   PT - Cognitive impairments: No apparent impairments                         Following commands: Intact       Cueing Cueing Techniques: Verbal cues, Gestural cues, Tactile cues     General Comments General comments (skin integrity,  edema, etc.): bil ankles wrapped in ACE bandage. elevated HR into 140's after sup>sit transition, elevated to 136bpm during short distance amb EOB>recliner.    Exercises Other Exercises Other Exercises: Pt and son educated re: PT role/POC, DC recommendations, safety with functional mobility, step by step facilitation of gait in RW, visual demo of gait/transfers with WB precautions, BLE WB precautions, CAM boot management, donning/doffing CAM boot, RICE, activity pacing/modification, square breathing for HR control, OOB to chair for meals, OOB to [facility] for toileting, call for help, benefits of STR. They verbalized understanding.   Assessment/Plan    PT Assessment Patient needs continued PT services  PT Problem List Decreased strength;Decreased activity tolerance;Decreased balance;Cardiopulmonary status limiting activity;Impaired sensation;Pain       PT Treatment Interventions DME instruction;Gait training;Stair training;Functional mobility training;Therapeutic activities;Therapeutic exercise;Balance training;Neuromuscular re-education    PT Goals (Current goals can be found in the Care Plan section)  Acute Rehab PT Goals Patient Stated Goal: go to STR and get better PT Goal Formulation: With patient/family Time For Goal Achievement: 11/29/23 Potential to Achieve Goals: Good Additional Goals Additional Goal #1: Pt will be IND and compliant with WB precautions and CAM boot management to promote optimal healing, reduce caregiver burden, for pain management, and to improve overall safety with functional mobility at DC.  Frequency Min 2X/week        AM-PAC PT 6 Clicks Mobility  Outcome Measure Help needed turning from your back to your side while in a flat bed without using bedrails?: A Little Help needed moving from lying on your back to sitting on the side of a flat bed without using bedrails?: A Little Help needed moving to and from a bed to a chair (including a wheelchair)?: A  Little Help needed standing up from a chair using your arms (e.g., wheelchair or bedside chair)?: A Little Help needed to walk in hospital room?: A Little Help needed climbing 3-5 steps with a railing? : A Lot 6 Click Score: 17    End of Session Equipment Utilized During Treatment: Gait belt (CAM boot LLE) Activity Tolerance: Patient limited by pain;Treatment limited secondary to medical complications (Comment) (tachycardia) Patient left: in chair;with call bell/phone within reach;with chair alarm set;with family/visitor present Nurse Communication: Mobility status PT Visit Diagnosis: Unsteadiness on feet (R26.81);Muscle weakness (generalized) (M62.81);History of falling (Z91.81);Other abnormalities of gait and mobility (R26.89);Pain Pain - Right/Left: Left Pain - part of body: Ankle and joints of foot    Time: 9057-8981 PT Time Calculation (min) (ACUTE ONLY): 36 min   Charges:   PT Evaluation $PT Eval Moderate Complexity: 1 Mod PT Treatments $Gait Training: 8-22 mins $Therapeutic Activity: 8-22 mins PT General Charges $$ ACUTE PT VISIT: 1 Visit         Camie CHARLENA Kluver, PT, DPT 12:25 PM,11/15/23 Physical Therapist - [facility]

## 2023-11-15 NOTE — Progress Notes (Signed)
 Progress Note   Patient: Garrett Yoder. FMW:[id number] DOB: 1946/01/20 DOA: 11/13/2023     0 DOS: the patient was seen and examined on 11/15/2023   Brief hospital course:  Garrett Gearhart. is a 78 y.o. male with medical history significant for Permanent A-fib on Eliquis , DM, HTN, BPH, CKD lllb, being admitted with A-fib with RVR and  symptomatic hypoglycemia/encephalopathy resulting in a fall with left metatarsal fractures.  Patient was found on the floor by his sister, semiresponsive.  Patient is now awake and alert, recently had his insulin  dose decreased by his endocrinologist who he saw on 9/23.  He had apparently been running hypoglycemic between 10 PM and 2 AM based on blood sugars uploaded from his Dexcom.  Today he said he was getting up from a chair and the next thing he was out.  He otherwise denies any recent illnesses, no vomiting or diarrhea.  No change in oral intake. On arrival of EMS CBG was 43 improving to 57 with oral glucose.  ,100 by arrival.  He reports recent changes in his insulin .  On arrival in the ED he was noted to be in rapid A-fib with heart rate in the 150s.  BP initially 161/85, but trended down to 104/86 during workup.,  Labs were notable for WBC of 12,000 Hemoglobin 10.8 (baseline 11-11.6), creatinine 1.57 near baseline, blood sugar 120. UA not done EKG showing A-fib at 130 Trauma imaging which included CT head and C-spine left rib series and x-ray foot significant for age-indeterminate fifth left rib fracture and acute fractures 4th and 5th left metatarsals   Patient was treated with an NS bolus, diltiazem  IV 10 mg with improvement in heart rate to the 90s by admission he was also given oral Cardizem  30 mg. He was placed in a boot and given oxycodone  for pain.   Admission requested.     Assessment and Plan:  Atrial fibrillation with rapid ventricular rate Patient with a known history of A-fib Received a one-time dose of IV diltiazem  in the ER with  initial improvement in his heart rate and then had a recurrence of RVR Patient was started on a Cardizem  drip and has been transitioned to oral Cardizem  Continue metoprolol  and digoxin  Continue Eliquis  as primary prophylaxis for an acute stroke Appreciate cardiology input     Acute hypoxic respiratory failure Noted to have room air pulse oximetry of 88% with shortness of breath requiring oxygen supplementation to maintain pulse oximetry greater than 92% Chest x-ray showed findings suggestive of pulmonary edema Continue IV Lasix  Patient has been weaned off oxygen and is currently on room air      Acute pulmonary edema Acute diastolic dysfunction CHF Related to tachycardia Last 2D echocardiogram from 03/25 shows an LVEF of 55 to 60% with normal LV function Optimize rate control Continue Lasix  40 mg IV daily      Uncontrolled type 2 diabetes mellitus with hypoglycemia, with long-term current use of insulin  (HCC) Acute metabolic encephalopathy secondary to hypoglycemia Syncope and collapse secondary to hypoglycemia Patient was found semiresponsive on the floor by his sister Was hypoglycemic upon presentation with blood sugar 43 Started on dextrose  infusion with improvement in his blood sugars.  Dextrose  infusion has since been discontinued Improve glycemic control Hemoglobin A1c is 6.4. May only require oral agents on discharge Will resume sliding scale coverage to optimize glycemic control at this time.       Fracture of one rib of left side, presumed acute Guaifenesin  Incentive  spirometer Pain control    Leukocytosis Suspect reactive has no stigmata of infection Chest x-ray was clear We will get UA and COVID panel to evaluate for possible infection    Closed nondisplaced fracture of fourth and fifth left metatarsal bone Pain right foot Pain control Appreciate podiatry input Recommends non operative treatment for the left foot.  Recommend patient use a walking boot  however and can weight bear as tolerated, applied a jones compressive bandage to the left lower extremity, and an acewrap for support to the right ankle Appreciate PT eval, recommend SNF for subacute rehab on admission       Acute urinary retention Patient with difficulty voiding and abdominal pain Bladder scan yielded 500 mL of urine Foley catheter in place           Subjective: Patient is seen and examined at the bedside.  No new complaints  Physical Exam: Vitals:   11/15/23 0028 11/15/23 0500 11/15/23 0517 11/15/23 1100  BP: (!) 116/55  (!) 128/57 130/60  Pulse: 66  97 90  Resp: 18  16 17   Temp: 99.2 F (37.3 C)  98.3 F (36.8 C) 98.5 F (36.9 C)  TempSrc: Oral   Oral  SpO2: 95%  95% 97%  Weight:  61.8 kg    Height:       Vitals and nursing note reviewed.  Constitutional:      General: He is not in acute distress.  Hard of hearing HENT:     Head: Normocephalic and atraumatic.  Cardiovascular:     Rate and Rhythm: Irregularly irregular, tachycardic    Heart sounds: Normal heart sounds.  Pulmonary:     Effort: Pulmonary effort is normal.     Breath sounds: Normal breath sounds.  Abdominal:     Palpations: Bowel sounds present, firm    Tenderness: suprapubic tenderness Musculoskeletal:     Comments: Left foot in boot, pain on range of motion right ankle  Neurological:     Mental Status: Mental status is at baseline.   Data Reviewed: Sodium 140, potassium 3.7, chloride 102, bicarb 22, glucose 186, BUN 23, creatinine 1.4, calcium  9.2, white count 14.1, hemoglobin 11.2, platelet count 539 There are no new results to review at this time.  Family Communication: Plan of care discussed with patient and his son at the bedside.  All questions and concerns have been addressed.  They verbalized understanding and agreed with the plan.  Disposition: Status is: Observation The patient remains OBS appropriate and will d/c before 2 midnights.  Planned Discharge  Destination: Skilled nursing facility    Time spent: 50 minutes  Author: Aimee Somerset, MD 11/15/2023 2:14 PM  For on call review [email or web address].

## 2023-11-16 ENCOUNTER — Other Ambulatory Visit: Payer: Self-pay | Admitting: Cardiology

## 2023-11-16 DIAGNOSIS — G9341 Metabolic encephalopathy: Secondary | ICD-10-CM | POA: Diagnosis not present

## 2023-11-16 DIAGNOSIS — S92345A Nondisplaced fracture of fourth metatarsal bone, left foot, initial encounter for closed fracture: Secondary | ICD-10-CM

## 2023-11-16 LAB — GLUCOSE, CAPILLARY
Glucose-Capillary: 241 mg/dL — ABNORMAL HIGH (ref 70–99)
Glucose-Capillary: 273 mg/dL — ABNORMAL HIGH (ref 70–99)
Glucose-Capillary: 287 mg/dL — ABNORMAL HIGH (ref 70–99)
Glucose-Capillary: 275 mg/dL — ABNORMAL HIGH (ref 70–99)

## 2023-11-16 MED ORDER — INSULIN GLARGINE 100 UNIT/ML ~~LOC~~ SOLN
8.0000 [IU] | Freq: Every day | SUBCUTANEOUS | Status: DC
  Administered 2023-11-16 – 2023-11-17 (×2): 8 [IU] via SUBCUTANEOUS
  Filled 2023-11-16 (×2): qty 0.08

## 2023-11-16 NOTE — Inpatient Diabetes Management (Addendum)
 Inpatient Diabetes Program Recommendations  AACE/ADA: New Consensus Statement on Inpatient Glycemic Control (2015)  Target Ranges:  Prepandial:   less than 140 mg/dL      Peak postprandial:   less than 180 mg/dL (1-2 hours)      Critically ill patients:  140 - 180 mg/dL    Latest Reference Range & Units 11/15/23 07:53 11/15/23 11:22 11/15/23 17:01 11/15/23 21:09  Glucose-Capillary 70 - 99 mg/dL 782 (H)  5 units Novolog   203 (H)  5 units Novolog   256 (H)  8 units Novolog   225 (H)  (H): Data is abnormally high  Latest Reference Range & Units 11/16/23 08:10  Glucose-Capillary 70 - 99 mg/dL 758 (H)  5 units Novolog    (H): Data is abnormally high   Admit with: Atrial fibrillation with RVR/ AMS/ Hypoglycemia/ Fall with Rib Fracture/ Foot Fracture  History: DM2, CKD  Home DM Meds: Humalog 4 units in the AM/ 12 units at 12pm       Lantus  15 units at HS       Metformin  1000 mg BID       Actos 15 mg daily  Current Orders: Novolog  Moderate Correction Scale/ SSI (0-15 units) TID AC    MD- Note Hypoglycemia on admission CBGs >200 all day yesterday CBG 241 this AM  Please consider resuming Lantus  at lower dose than home dose: Recommend Lantus  8 units at HS (50% home dose)    ENDO: Dr. Damian Seen 11/10/2023 Was having HYPO at home Dr. Damian reduced the Lantus  from 20 units to 15 units at HS Pt was also supposed to be taking Humalog 6 units w/ Bfast and 10 units w/ Lunch    --Will follow patient during hospitalization--  Adina Rudolpho Arrow RN, MSN, CDCES Diabetes Coordinator Inpatient Glycemic Control Team Team Pager: [phone] (8a-5p)

## 2023-11-16 NOTE — Progress Notes (Signed)
 Physical Therapy Treatment Patient Details Name: Lashon Hillier. MRN: [id number] DOB: [birth year] Today's Date: 11/16/2023   History of Present Illness Pt admitted to [facility] on 11/13/23 under observation for c/o mechanical fall and hypoglycemia. Admitted for further workup of Afib with RVR and symptomatic acute metabolic encephalopathy. Imaging significant for age indeterminate L 5th rib fx, R ankle sprain, distal 4th/5th L met fx, and mild volume overload and developing pulmonary edema. Significant PMH includes: HTN, HLD, Afib (Eliquis ), T2DM, BPH, CKD (3b).    PT Comments  Pt received in bed, discussed role of acute PT and plans for STR at d/c to assist in returning to independent function prior to returning home. Pt required MinA to transition to EOB with increased time.  L CAM boot and R regular shoe donned with assist in sitting. MinA to stand from raised bed to RW. Pt completed gait training in room short distance due to increased fatigue and L LE pain. Pt positioned to comfort in bedside chair with all needs met. Continue PT per POC.    If plan is discharge home, recommend the following: A little help with walking and/or transfers;A little help with bathing/dressing/bathroom;Assistance with cooking/housework;Assist for transportation;Help with stairs or ramp for entrance   Can travel by private vehicle     Yes  Equipment Recommendations  Other (comment) (TBD at next level of care)    Recommendations for Other Services       Precautions / Restrictions Precautions Precautions: Fall Recall of Precautions/Restrictions: Intact Restrictions Weight Bearing Restrictions Per Provider Order: Yes LLE Weight Bearing Per Provider Order: Weight bearing as tolerated Other Position/Activity Restrictions: CAM boot LLE     Mobility  Bed Mobility                    Transfers Overall transfer level: Needs assistance Equipment used: Rolling walker (2 wheels) Transfers: Sit to/from  Stand Sit to Stand: Min assist           General transfer comment: minA for power to stand from EOB with RW, max multimodal cues for safety, sequencing, hand placement, and WB precautions. minA for controlled descent to sit in recliner, max multimodal cues for safety, sequencing, hand placement, WB precautions, and RW proximity. demo's poor eccentric lowering.    Ambulation/Gait Ambulation/Gait assistance: Contact guard assist Gait Distance (Feet):  (40) Assistive device: Rolling walker (2 wheels) Gait Pattern/deviations: Step-to pattern, Decreased stride length Gait velocity:  (decr)     General Gait Details: MinA for balance and turning   Stairs             Wheelchair Mobility     Tilt Bed    Modified Rankin (Stroke Patients Only)       Balance Overall balance assessment: Needs assistance Sitting-balance support: Feet supported, No upper extremity supported Sitting balance-Leahy Scale: Good Sitting balance - Comments:  (Pt required assist to properly don CAM boot and regular shoe on Right)   Standing balance support: During functional activity, Reliant on assistive device for balance Standing balance-Leahy Scale: Fair Standing balance comment: minA for balance in RW                            Communication Communication Communication: No apparent difficulties Factors Affecting Communication: Hearing impaired  Cognition Arousal: Alert Behavior During Therapy: WFL for tasks assessed/performed   PT - Cognitive impairments: No apparent impairments  Following commands: Intact      Cueing Cueing Techniques: Verbal cues, Gestural cues, Tactile cues  Exercises General Exercises - Lower Extremity Long Arc Quad: AROM, Both, 10 reps, Seated Hip Flexion/Marching: AROM, Both, 5 reps, Seated    General Comments        Pertinent Vitals/Pain Pain Assessment Pain Assessment: Faces Faces Pain Scale: Hurts whole  lot Pain Location: L foot Pain Descriptors / Indicators: Aching Pain Intervention(s): Limited activity within patient's tolerance    Home Living                          Prior Function            PT Goals (current goals can now be found in the care plan section) Acute Rehab PT Goals Patient Stated Goal: go to STR and get better Progress towards PT goals: Progressing toward goals    Frequency    Min 2X/week      PT Plan      Co-evaluation              AM-PAC PT 6 Clicks Mobility   Outcome Measure  Help needed turning from your back to your side while in a flat bed without using bedrails?: A Little Help needed moving from lying on your back to sitting on the side of a flat bed without using bedrails?: A Little Help needed moving to and from a bed to a chair (including a wheelchair)?: A Little Help needed standing up from a chair using your arms (e.g., wheelchair or bedside chair)?: A Little Help needed to walk in hospital room?: A Little Help needed climbing 3-5 steps with a railing? : A Lot 6 Click Score: 17    End of Session Equipment Utilized During Treatment: Gait belt Activity Tolerance: Patient limited by fatigue;Patient limited by pain Patient left: in chair;with call bell/phone within reach;with chair alarm set;with family/visitor present Nurse Communication: Mobility status PT Visit Diagnosis: Unsteadiness on feet (R26.81);Muscle weakness (generalized) (M62.81);History of falling (Z91.81);Other abnormalities of gait and mobility (R26.89);Pain Pain - Right/Left: Left Pain - part of body: Ankle and joints of foot     Time: 1422-1446 PT Time Calculation (min) (ACUTE ONLY): 24 min  Charges:    $Gait Training: 8-22 mins $Therapeutic Exercise: 8-22 mins PT General Charges $$ ACUTE PT VISIT: 1 Visit                    Darice Bohr, PTA  Darice JAYSON Bohr 11/16/2023, 3:40 PM

## 2023-11-16 NOTE — Plan of Care (Signed)
  Problem: Education: Goal: Ability to describe self-care measures that may prevent or decrease complications (Diabetes Survival Skills Education) will improve Outcome: Progressing Goal: Individualized Educational Video(s) Outcome: Progressing   Problem: Coping: Goal: Ability to adjust to condition or change in health will improve Outcome: Progressing   Problem: Fluid Volume: Goal: Ability to maintain a balanced intake and output will improve Outcome: Progressing   Problem: Health Behavior/Discharge Planning: Goal: Ability to identify and utilize available resources and services will improve Outcome: Progressing Goal: Ability to manage health-related needs will improve Outcome: Progressing   Problem: Metabolic: Goal: Ability to maintain appropriate glucose levels will improve Outcome: Progressing   Problem: Nutritional: Goal: Maintenance of adequate nutrition will improve Outcome: Progressing Goal: Progress toward achieving an optimal weight will improve Outcome: Progressing   Problem: Skin Integrity: Goal: Risk for impaired skin integrity will decrease Outcome: Progressing   Problem: Tissue Perfusion: Goal: Adequacy of tissue perfusion will improve Outcome: Progressing   Problem: Education: Goal: Knowledge of condition and prescribed therapy will improve Outcome: Progressing   Problem: Cardiac: Goal: Will achieve and/or maintain adequate cardiac output Outcome: Progressing   Problem: Physical Regulation: Goal: Complications related to the disease process, condition or treatment will be avoided or minimized Outcome: Progressing   Problem: Education: Goal: Knowledge of General Education information will improve Description: Including pain rating scale, medication(s)/side effects and non-pharmacologic comfort measures Outcome: Progressing   Problem: Health Behavior/Discharge Planning: Goal: Ability to manage health-related needs will improve Outcome:  Progressing   Problem: Clinical Measurements: Goal: Ability to maintain clinical measurements within normal limits will improve Outcome: Progressing Goal: Will remain free from infection Outcome: Progressing Goal: Diagnostic test results will improve Outcome: Progressing Goal: Respiratory complications will improve Outcome: Progressing Goal: Cardiovascular complication will be avoided Outcome: Progressing   Problem: Activity: Goal: Risk for activity intolerance will decrease Outcome: Progressing   Problem: Nutrition: Goal: Adequate nutrition will be maintained Outcome: Progressing   Problem: Coping: Goal: Level of anxiety will decrease Outcome: Progressing   Problem: Elimination: Goal: Will not experience complications related to bowel motility Outcome: Progressing Goal: Will not experience complications related to urinary retention Outcome: Progressing   Problem: Pain Managment: Goal: General experience of comfort will improve and/or be controlled Outcome: Progressing   Problem: Safety: Goal: Ability to remain free from injury will improve Outcome: Progressing   Problem: Skin Integrity: Goal: Risk for impaired skin integrity will decrease Outcome: Progressing

## 2023-11-16 NOTE — Progress Notes (Signed)
 Progress Note   Patient: Garrett Yoder. FMW:[id number] DOB: 1945-07-23 DOA: 11/13/2023     1 DOS: the patient was seen and examined on 11/16/2023   Brief hospital course:  Tristin Vandeusen. is a 78 y.o. male with medical history significant for Permanent A-fib on Eliquis , DM, HTN, BPH, CKD lllb, being admitted with A-fib with RVR and  symptomatic hypoglycemia/encephalopathy resulting in a fall with left metatarsal fractures.  Patient was found on the floor by his sister, semiresponsive.  Patient is now awake and alert, recently had his insulin  dose decreased by his endocrinologist who he saw on 9/23.  He had apparently been running hypoglycemic between 10 PM and 2 AM based on blood sugars uploaded from his Dexcom.  Today he said he was getting up from a chair and the next thing he was out.  He otherwise denies any recent illnesses, no vomiting or diarrhea.  No change in oral intake. On arrival of EMS CBG was 43 improving to 57 with oral glucose.  ,100 by arrival.  He reports recent changes in his insulin .  On arrival in the ED he was noted to be in rapid A-fib with heart rate in the 150s.  BP initially 161/85, but trended down to 104/86 during workup.,  Labs were notable for WBC of 12,000 Hemoglobin 10.8 (baseline 11-11.6), creatinine 1.57 near baseline, blood sugar 120. UA not done EKG showing A-fib at 130 Trauma imaging which included CT head and C-spine left rib series and x-ray foot significant for age-indeterminate fifth left rib fracture and acute fractures 4th and 5th left metatarsals   Patient was treated with an NS bolus, diltiazem  IV 10 mg with improvement in heart rate to the 90s by admission he was also given oral Cardizem  30 mg. He was placed in a boot and given oxycodone  for pain.   Admission requested.   Assessment and Plan:  Atrial fibrillation with rapid ventricular rate Patient with a known history of A-fib Received a one-time dose of IV diltiazem  in the ER with  initial improvement in his heart rate and then had a recurrence of RVR Patient was started on a Cardizem  drip and has since been transitioned to oral Cardizem . Continue metoprolol  and digoxin  Continue Eliquis  as primary prophylaxis for an acute stroke Appreciate cardiology input     Acute hypoxic respiratory failure Noted to have room air pulse oximetry of 88% with shortness of breath requiring oxygen supplementation to maintain pulse oximetry greater than 92% Chest x-ray showed findings suggestive of pulmonary edema Patient received 3 doses of IV Lasix .  Will discontinue Patient has been weaned off oxygen and is currently on room air       Acute pulmonary edema Acute diastolic dysfunction CHF Related to tachycardia Last 2D echocardiogram from 03/25 shows an LVEF of 55 to 60% with normal LV function Optimize rate control Patient received 3 doses of IV Lasix  has been weaned off oxygen.      Uncontrolled type 2 diabetes mellitus with hypoglycemia, with long-term current use of insulin  (HCC) Acute metabolic encephalopathy secondary to hypoglycemia Syncope and collapse secondary to hypoglycemia Patient was found semiresponsive on the floor by his sister Was hypoglycemic upon presentation with blood sugar 43 Started on dextrose  infusion with improvement in his blood sugars.  Dextrose  infusion has since been discontinued Blood sugars have remained over 200 Hemoglobin A1c is 6.4. Start Lantus  but at one half his scheduled dose, 8 units daily Continue sliding scale insulin  Monitor blood sugars closely.  Fracture of one rib of left side, presumed acute Guaifenesin  Incentive spirometer Pain control     Leukocytosis Suspect reactive has no stigmata of infection Chest x-ray was clear Urine analysis is sterile     Closed nondisplaced fracture of fourth and fifth left metatarsal bone Pain right foot Pain control Appreciate podiatry input Recommends non operative treatment  for the left foot.  Recommend patient use a walking boot however and can weight bear as tolerated using the boot but should favor heel  Acewrap for support to the right ankle Appreciate PT eval, recommend SNF for subacute rehab on admission       Acute urinary retention Patient with difficulty voiding and abdominal pain on admission (09/27) Bladder scan yielded 500 mL of urine Foley catheter was placed Continue Flomax  Voiding trial today             Subjective: No new complaints.  Physical Exam: Vitals:   11/16/23 0418 11/16/23 0546 11/16/23 0811 11/16/23 1142  BP: 125/63  131/72 (!) 122/57  Pulse: 97  (!) 58 88  Resp: 17  18 18   Temp: 98.5 F (36.9 C)  97.7 F (36.5 C) 98.6 F (37 C)  TempSrc:   Oral   SpO2: 98%  98% 96%  Weight:  59.5 kg    Height:       Constitutional:      General: He is not in acute distress.  Hard of hearing HENT:     Head: Normocephalic and atraumatic.  Cardiovascular:     Rate and Rhythm: Irregularly irregular,    Heart sounds: Normal heart sounds.  Pulmonary:     Effort: Pulmonary effort is normal.     Breath sounds: Normal breath sounds.  Abdominal:     Palpations: Bowel sounds present, firm    Tenderness: suprapubic tenderness Musculoskeletal:     Comments: Left foot in boot, pain on range of motion right ankle  Neurological:     Mental Status: Mental status is at baseline.    Data Reviewed:  There are no new results to review at this time.  Family Communication: Plan of care was discussed with patient at the bedside.  He verbalizes understanding and agrees to the plan.  Disposition: Status is: Inpatient Remains inpatient appropriate because: Awaiting discharge to SNF for subacute rehab  Planned Discharge Destination: Skilled nursing facility    Time spent: 40 minutes  Author: Aimee Somerset, MD 11/16/2023 12:50 PM  For on call review [email or web address].

## 2023-11-17 DIAGNOSIS — G9341 Metabolic encephalopathy: Secondary | ICD-10-CM | POA: Diagnosis not present

## 2023-11-17 LAB — GLUCOSE, CAPILLARY
Glucose-Capillary: 241 mg/dL — ABNORMAL HIGH (ref 70–99)
Glucose-Capillary: 372 mg/dL — ABNORMAL HIGH (ref 70–99)
Glucose-Capillary: 378 mg/dL — ABNORMAL HIGH (ref 70–99)
Glucose-Capillary: 83 mg/dL (ref 70–99)
Glucose-Capillary: 238 mg/dL — ABNORMAL HIGH (ref 70–99)

## 2023-11-17 LAB — CBC
HCT: 32.8 % — ABNORMAL LOW (ref 39.0–52.0)
Hemoglobin: 10.8 g/dL — ABNORMAL LOW (ref 13.0–17.0)
MCH: 30.7 pg (ref 26.0–34.0)
MCHC: 32.9 g/dL (ref 30.0–36.0)
MCV: 93.2 fL (ref 80.0–100.0)
Platelets: 512 K/uL — ABNORMAL HIGH (ref 150–400)
RBC: 3.52 MIL/uL — ABNORMAL LOW (ref 4.22–5.81)
RDW: 13.4 % (ref 11.5–15.5)
WBC: 10.1 K/uL (ref 4.0–10.5)
nRBC: 0 % (ref 0.0–0.2)

## 2023-11-17 LAB — BASIC METABOLIC PANEL WITH GFR
Anion gap: 10 (ref 5–15)
BUN: 37 mg/dL — ABNORMAL HIGH (ref 8–23)
CO2: 28 mmol/L (ref 22–32)
Calcium: 8.7 mg/dL — ABNORMAL LOW (ref 8.9–10.3)
Chloride: 98 mmol/L (ref 98–111)
Creatinine, Ser: 1.65 mg/dL — ABNORMAL HIGH (ref 0.61–1.24)
GFR, Estimated: 43 mL/min — ABNORMAL LOW (ref >60)
Glucose, Bld: 238 mg/dL — ABNORMAL HIGH (ref 70–99)
Potassium: 3.6 mmol/L (ref 3.5–5.1)
Sodium: 136 mmol/L (ref 135–145)

## 2023-11-17 MED ORDER — PIOGLITAZONE HCL 15 MG PO TABS
15.0000 mg | ORAL_TABLET | Freq: Every day | ORAL | Status: DC
Start: 2023-11-17 — End: 2023-11-17
  Filled 2023-11-17: qty 1

## 2023-11-17 MED ORDER — INSULIN GLARGINE 100 UNIT/ML ~~LOC~~ SOLN
12.0000 [IU] | Freq: Every day | SUBCUTANEOUS | Status: DC
  Administered 2023-11-18: 12 [IU] via SUBCUTANEOUS
  Filled 2023-11-17: qty 0.12

## 2023-11-17 MED ORDER — ADULT MULTIVITAMIN W/MINERALS CH
1.0000 | ORAL_TABLET | Freq: Two times a day (BID) | ORAL | Status: DC
  Administered 2023-11-17 – 2023-11-18 (×3): 1 via ORAL
  Filled 2023-11-17 (×4): qty 1

## 2023-11-17 MED ORDER — LATANOPROST 0.005 % OP SOLN
1.0000 [drp] | Freq: Every day | OPHTHALMIC | Status: DC
  Administered 2023-11-17: 1 [drp] via OPHTHALMIC
  Filled 2023-11-17: qty 2.5

## 2023-11-17 MED ORDER — INSULIN ASPART 100 UNIT/ML IJ SOLN
3.0000 [IU] | Freq: Three times a day (TID) | INTRAMUSCULAR | Status: DC
  Administered 2023-11-17 – 2023-11-18 (×3): 3 [IU] via SUBCUTANEOUS
  Filled 2023-11-17 (×4): qty 1

## 2023-11-17 NOTE — Progress Notes (Signed)
 Mobility Specialist Progress Note:    11/17/23 1333  Mobility  Activity Ambulated with assistance  Level of Assistance Minimal assist, patient does 75% or more  Assistive Device Front wheel walker  Distance Ambulated (ft) 50 ft  Range of Motion/Exercises Active;All extremities  RLE Weight Bearing Per Provider Order WBAT  LLE Weight Bearing Per Provider Order WBAT  Activity Response Tolerated well  Mobility visit 1 Mobility  Mobility Specialist Start Time (ACUTE ONLY) 1317  Mobility Specialist Stop Time (ACUTE ONLY) 1332  Mobility Specialist Time Calculation (min) (ACUTE ONLY) 15 min   Pt received in bed, cam boot placed prior to mobility. Required MinA to stand and CGA to ambulate with RW. Tolerated well, c/o fatigue near EOS. Returned supine, turned pt on left side d/t sacral pain. RN at bedside and aware. All needs met.   Sherrilee Ditty Mobility Specialist Please contact via Special educational needs teacher or  Rehab office at [phone]

## 2023-11-17 NOTE — Progress Notes (Signed)
 [facility]  - [place] at [facility]   PATIENT NAME: Garrett Yoder    MR#:  [id number]  DATE OF BIRTH:  [birth year]  SUBJECTIVE:  no family at bedside. Tolerating PO diet. Overall doing well. Denies chest pain.    VITALS:  Blood pressure (!) 128/48, pulse 84, temperature 98.2 F (36.8 C), resp. rate 17, height 5' 8 (1.727 m), weight 56.3 kg, SpO2 98%.  PHYSICAL EXAMINATION:   GENERAL:  78 y.o.-year-old patient with no acute distress.  LUNGS: Normal breath sounds bilaterally, no wheezing CARDIOVASCULAR: S1, S2 normal. No murmur   ABDOMEN: Soft, nontender, nondistended. Bowel sounds present.  EXTREMITIES: right foot  Ace Wrap NEUROLOGIC: nonfocal  patient is alert and awake  LABORATORY PANEL:  CBC Recent Labs  Lab 11/17/23 0519  WBC 10.1  HGB 10.8*  HCT 32.8*  PLT 512*    Chemistries  Recent Labs  Lab 11/13/23 1756 11/15/23 1010 11/17/23 0519  NA 139 140 136  K 4.1 3.7 3.6  CL 105 102 98  CO2 21* 22 28  GLUCOSE 120* 196* 238*  BUN 21 23 37*  CREATININE 1.57* 1.44* 1.65*  CALCIUM  9.0 9.2 8.7*  MG  --  1.9  --   AST 42*  --   --   ALT 39  --   --   ALKPHOS 111  --   --   BILITOT 0.5  --   --     Assessment and Plan Kian Ottaviano. is a 78 y.o. male with medical history significant for Permanent A-fib on Eliquis , DM, HTN, BPH, CKD lllb, being admitted with A-fib with RVR and  symptomatic hypoglycemia/encephalopathy resulting in a fall with left metatarsal fractures.  Patient was found on the floor by his sister, semiresponsive.  Patient is now awake and alert, recently had his insulin  dose decreased by his endocrinologist who he saw on 9/23.  He had apparently been running hypoglycemic between 10 PM and 2 AM based on blood sugars uploaded from his Dexcom.    EKG showing A-fib at 130 Trauma imaging which included CT head and C-spine left rib series and x-ray foot significant for age-indeterminate fifth left rib fracture and acute fractures  4th and 5th left metatarsals  Atrial fibrillation with rapid ventricular rate --Patient with a known history of A-fib --Received a one-time dose of IV diltiazem  in the ER with initial improvement in his heart rate and then had a recurrence of RVR --Patient was started on a Cardizem  drip and has since been transitioned to oral Cardizem . --Continue metoprolol  and digoxin  --Continue Eliquis  as primary prophylaxis for an acute stroke --Appreciate [facility] cardiology input   Acute hypoxic respiratory failure-- resolved Acute pulmonary edema Acute diastolic dysfunction CHF Noted to have room air pulse oximetry of 88% with shortness of breath requiring oxygen supplementation to maintain pulse oximetry greater than 92% --Chest x-ray showed findings suggestive of pulmonary edema --Last 2D echocardiogram from 03/25 shows an LVEF of 55 to 60% with normal LV function Optimize rate control --Patient received 3 doses of IV Lasix  has been weaned off oxygen.   Uncontrolled type 2 diabetes mellitus with hypoglycemia, with long-term current use of insulin  (HCC) Acute metabolic encephalopathy secondary to hypoglycemia Syncope and collapse secondary to hypoglycemia --Patient was found semiresponsive on the floor by his sister --Was hypoglycemic upon presentation with blood sugar 43 --Blood sugars have remained over 200 --Hemoglobin A1c is 6.4. --Start Lantus  +SSI -- given renal insufficiency and CHF will hold off on  Actos and metformin  at discharge -- patient follows with endocrinology Dr. Damian as outpatient   Fracture of one rib of left side, presumed acute --Incentive spirometer --Pain control   Closed nondisplaced fracture of fourth and fifth left metatarsal bone Pain right foot --Appreciate podiatry input --Recommends non operative treatment for the left foot.  -Recommend patient use a walking boot however and can weight bear as tolerated using the boot but should favor heel  --Acewrap for support  to the right ankle --Appreciate PT eval, recommend SNF for subacute rehab on admission    Acute urinary retention --Patient with difficulty voiding and abdominal pain on admission (09/27) --Bladder scan yielded 500 mL of urine --Foley catheter was placed --Continue Flomax  -- Foley removed. Patient voiding okay  Patient medically stable for discharge. Awaiting rehab bed  Procedures: Family communication :none today Consults : [facility] cardiology CODE STATUS: full DVT Prophylaxis : eliquis  Level of care: Progressive Status is: Inpatient Remains inpatient appropriate because: awaiting rehab bed    TOTAL TIME TAKING CARE OF THIS PATIENT: 35 minutes.  >50% time spent on counselling and coordination of care  Note: This dictation was prepared with Dragon dictation along with smaller phrase technology. Any transcriptional errors that result from this process are unintentional.  Leita Blanch M.D    [facility]   CC: Primary care physician; [name], FNP

## 2023-11-17 NOTE — NC FL2 (Signed)
 [place]  MEDICAID FL2 LEVEL OF CARE FORM     IDENTIFICATION  Patient Name: Garrett Yoder. Birthdate: 05-01-1945 Sex: male Admission Date (Current Location): 11/13/2023  [facility] and [place] Number:  Chiropodist and Address:  [facility], [address] [place], [place] [address]      Provider Number: [id number]  Attending Physician Name and Address:  Tobie Calix, MD  Relative Name and Phone Number:  Fauver,michael (Son)  [phone] [facility])    Current Level of Care: SNF Recommended Level of Care: Skilled Nursing Facility Prior Approval Number:    Date Approved/Denied:   PASRR Number: [id number]  Discharge Plan: SNF    Current Diagnoses: Patient Active Problem List   Diagnosis Date Noted   Closed nondisplaced fracture of fourth metatarsal bone of left foot 11/16/2023   Atrial flutter with rapid ventricular response (HCC) 11/15/2023   Acute urinary retention 11/14/2023   Acute hypoxic respiratory failure (HCC) 11/14/2023   Pulmonary edema 11/14/2023   Atrial fibrillation with rapid ventricular response (HCC) 11/14/2023   Uncontrolled type 2 diabetes mellitus with hypoglycemia, with long-term current use of insulin  (HCC) 11/13/2023   Syncope and collapse 11/13/2023   Closed nondisplaced fracture of fourth and fifth left metatarsal bone 11/13/2023   Leukocytosis 11/13/2023   Fracture of one rib of left side, presumed acute 11/13/2023   SIRS (systemic inflammatory response syndrome) (HCC) 04/26/2023   Atrial fibrillation with RVR (HCC) 04/26/2023   Acute metabolic encephalopathy 04/26/2023   Persistent atrial fibrillation (HCC) 02/27/2022   Chronic kidney disease, stage 3b (HCC) 08/16/2020   Glaucoma of both eyes 11/29/2018   Benign prostatic hyperplasia with nocturia 11/29/2018   Abnormal findings on diagnostic imaging of lung 11/29/2018   Type 2 diabetes mellitus with hyperglycemia, with long-term current use of insulin  (HCC)  05/28/2018   Lung nodule    Exposure to cigarette smoke 03/09/2018   Aortic atherosclerosis 11/25/2016   Coronary artery calcification seen on CAT scan 11/25/2016   Incidental lung nodule, > 3mm and < 8mm 11/25/2016   Aorto-iliac atherosclerosis 11/25/2016   Hypertrophy of prostate with urinary obstruction 11/25/2016   Abnormal computed tomography of bladder 11/25/2016   DDD (degenerative disc disease), cervical 08/21/2016   Facet arthropathy, cervical 08/21/2016   De Quervain's tenosynovitis 08/21/2016   Acid reflux 09/09/2015   Essential hypertension, benign 08/14/2015   Hyperlipidemia 08/14/2015   Anemia 08/14/2015   Essential (hemorrhagic) thrombocythemia (HCC) 08/14/2015    Orientation RESPIRATION BLADDER Height & Weight     Self, Time, Situation, Place  Normal Indwelling catheter Weight: 124 lb 1.9 oz (56.3 kg) Height:  5' 8 (172.7 cm)  BEHAVIORAL SYMPTOMS/MOOD NEUROLOGICAL BOWEL NUTRITION STATUS      Continent Diet  AMBULATORY STATUS COMMUNICATION OF NEEDS Skin   Limited Assist (cam boot) Verbally Bruising (ecchymosis, sacrum area)                       Personal Care Assistance Level of Assistance  Bathing, Feeding, Dressing Bathing Assistance: Limited assistance Feeding assistance: Limited assistance Dressing Assistance: Limited assistance     Functional Limitations Info  Sight, Hearing, Speech Sight Info: Impaired (wears glasses) Hearing Info: Impaired (hard of hearing, come close and speak up) Speech Info: Adequate    SPECIAL CARE FACTORS FREQUENCY  PT (By licensed PT)     PT Frequency: 5x a week              Contractures Contractures Info: Not present  Additional Factors Info  Code Status, Allergies Code Status Info: Full Allergies Info: Codeine           Current Medications (11/17/2023):  This is the current hospital active medication list Current Facility-Administered Medications  Medication Dose Route Frequency Provider Last Rate  Last Admin   acetaminophen  (TYLENOL ) tablet 650 mg  650 mg Oral Q6H PRN Duncan, Hazel V, MD   650 mg at 11/15/23 1606   Or   acetaminophen  (TYLENOL ) suppository 650 mg  650 mg Rectal Q6H PRN Duncan, Hazel V, MD       apixaban  (ELIQUIS ) tablet 5 mg  5 mg Oral BID Duncan, Hazel V, MD   5 mg at 11/17/23 9166   atorvastatin  (LIPITOR) tablet 40 mg  40 mg Oral Daily Duncan, Hazel V, MD   40 mg at 11/17/23 9166   Chlorhexidine  Gluconate Cloth 2 % PADS 6 each  6 each Topical Daily Agbata, Tochukwu, MD   6 each at 11/16/23 1034   digoxin  (LANOXIN ) tablet 62.5 mcg  62.5 mcg Oral Daily Duncan, Hazel V, MD   62.5 mcg at 11/17/23 9165   diltiazem  (CARDIZEM  CD) 24 hr capsule 120 mg  120 mg Oral Daily Agbata, Tochukwu, MD   120 mg at 11/17/23 9166   guaiFENesin  (MUCINEX ) 12 hr tablet 600 mg  600 mg Oral BID Duncan, Hazel V, MD   600 mg at 11/17/23 9165   HYDROcodone -acetaminophen  (NORCO/VICODIN) 5-325 MG per tablet 1-2 tablet  1-2 tablet Oral Q4H PRN Duncan, Hazel V, MD       insulin  aspart (novoLOG ) injection 0-15 Units  0-15 Units Subcutaneous TID WC Agbata, Tochukwu, MD   5 Units at 11/17/23 9167   insulin  glargine (LANTUS ) injection 8 Units  8 Units Subcutaneous Daily Agbata, Tochukwu, MD   8 Units at 11/16/23 1632   lidocaine  (LIDODERM ) 5 % 1 patch  1 patch Transdermal Q24H Agbata, Tochukwu, MD   1 patch at 11/16/23 1032   metoprolol  succinate (TOPROL -XL) 24 hr tablet 100 mg  100 mg Oral Daily Duncan, Hazel V, MD   100 mg at 11/17/23 9166   morphine  (PF) 2 MG/ML injection 2 mg  2 mg Intravenous Q3H PRN Duncan, Hazel V, MD       ondansetron  (ZOFRAN ) tablet 4 mg  4 mg Oral Q6H PRN Duncan, Hazel V, MD       Or   ondansetron  (ZOFRAN ) injection 4 mg  4 mg Intravenous Q6H PRN Duncan, Hazel V, MD       oxyCODONE  (Oxy IR/ROXICODONE ) immediate release tablet 5 mg  5 mg Oral Q3H PRN Bradler, Evan K, MD   5 mg at 11/13/23 2054   sodium chloride  flush (NS) 0.9 % injection 3 mL  3 mL Intravenous Q12H Cleatus Delayne GAILS,  MD   3 mL at 11/17/23 0841   tamsulosin  (FLOMAX ) capsule 0.4 mg  0.4 mg Oral Daily Duncan, Hazel V, MD   0.4 mg at 11/17/23 9166     Discharge Medications: Please see discharge summary for a list of discharge medications.  Relevant Imaging Results:  Relevant Lab Results:   Additional Information DOB: Jul 17, 1945,  SS# [id number]  Edsel DELENA Fischer, LCSW

## 2023-11-17 NOTE — Care Management Important Message (Signed)
 Important Message  Patient Details  Name: Garrett Yoder. MRN: [id number] Date of Birth: 1945/10/28   Important Message Given:  Yes - Medicare IM     Garrett Yoder 11/17/2023, 4:45 PM

## 2023-11-17 NOTE — Plan of Care (Signed)
  Problem: Health Behavior/Discharge Planning: Goal: Ability to manage health-related needs will improve Outcome: Progressing   Problem: Education: Goal: Knowledge of General Education information will improve Description: Including pain rating scale, medication(s)/side effects and non-pharmacologic comfort measures Outcome: Progressing   Problem: Clinical Measurements: Goal: Respiratory complications will improve Outcome: Progressing   Problem: Pain Managment: Goal: General experience of comfort will improve and/or be controlled Outcome: Progressing   Problem: Clinical Measurements: Goal: Cardiovascular complication will be avoided Outcome: Not Progressing

## 2023-11-17 NOTE — TOC Progression Note (Addendum)
 Transition of Care (TOC) - Progression Note    Patient Details  Name: Garrett Yoder. MRN: [id number] Date of Birth: [birth year]  [facility]) CM/SW Contact  Lauraine JAYSON Carpen, LCSW Phone Number: 11/17/2023, 11:08 AM  Clinical Narrative:  [name] considering patient. CSW left admissions coordinator a message to see when they would have a bed since they are first preference.   1:37 pm: [facility] offered a bed but won't have availability until Thursday. CSW went by room to provide bed offers but he was using the urinal. CSW left the offers in the room and will try again later.  2:04 pm: CSW met with patient. Sister on speakerphone. They prefer [facility] because their parents were there for several years and are familiar with the program. CSW will start auth today. CSW asked admissions coordinator to notify if a bed opens up tomorrow. Sister would prefer ambulance transport due to safety but understands he may not qualify.  2:35 pm: CSW started insurance authorization.  Expected Discharge Plan: Skilled Nursing Facility Barriers to Discharge: Continued Medical Work up               Expected Discharge Plan and Services In-house Referral: Clinical Social Work     Living arrangements for the past 2 months: Single Family Home                                       Social Drivers of Health (SDOH) Interventions SDOH Screenings   Food Insecurity: No Food Insecurity (11/15/2023)  Housing: Low Risk  (11/15/2023)  Transportation Needs: No Transportation Needs (11/15/2023)  Utilities: Not At Risk (11/15/2023)  Alcohol Screen: Low Risk  (10/15/2023)  Depression (PHQ2-9): Low Risk  (10/15/2023)  Financial Resource Strain: Low Risk  (10/15/2023)  Physical Activity: Insufficiently Active (10/15/2023)  Social Connections: Moderately Isolated (11/15/2023)  Stress: No Stress Concern Present (10/15/2023)  Tobacco Use: Medium Risk (11/13/2023)  Health  Literacy: Adequate Health Literacy (10/15/2023)    Readmission Risk Interventions     No data to display

## 2023-11-18 DIAGNOSIS — G9341 Metabolic encephalopathy: Secondary | ICD-10-CM | POA: Diagnosis not present

## 2023-11-18 LAB — GLUCOSE, CAPILLARY
Glucose-Capillary: 274 mg/dL — ABNORMAL HIGH (ref 70–99)
Glucose-Capillary: 204 mg/dL — ABNORMAL HIGH (ref 70–99)

## 2023-11-18 MED ORDER — DILTIAZEM HCL ER COATED BEADS 120 MG PO CP24: 120.0000 mg | ORAL_CAPSULE | Freq: Every day | ORAL | 0 refills | Status: AC

## 2023-11-18 MED ORDER — LIDOCAINE 5 % EX PTCH: 1.0000 | MEDICATED_PATCH | CUTANEOUS | 0 refills | Status: AC

## 2023-11-18 NOTE — Progress Notes (Signed)
   11/17/23 2351  Assess: MEWS Score  Temp 98.5 F (36.9 C)  BP (!) 144/102  MAP (mmHg) 116  Pulse Rate (!) 149  Resp 20  SpO2 97 %  O2 Device Room Air  Assess: MEWS Score  MEWS Temp 0  MEWS Systolic 0  MEWS Pulse 3  MEWS RR 0  MEWS LOC 0  MEWS Score 3  MEWS Score Color Yellow  Assess: if the MEWS score is Yellow or Red  Were vital signs accurate and taken at a resting state? No, vital signs rechecked  Does the patient meet 2 or more of the SIRS criteria? No  Provider Notification  Provider Name/Title Dr. Cleatus  Date Provider Notified 11/18/23  Time Provider Notified 0008  Method of Notification Page (secured)  Notification Reason Other (Comment) (Afib RVR)  Provider response No new orders  Date of Provider Response 11/18/23  Time of Provider Response 0034  Assess: SIRS CRITERIA  SIRS Temperature  0  SIRS Respirations  0  SIRS Pulse 1  SIRS WBC 0  SIRS Score Sum  1   . The patient  just had episode of Afib RVR at a rate of  160s while urinating. He denies symptoms of it. His HR is now fluctuating from 92 to 140s. Dr. Cleatus notified without any new order. Will continue to monitor.

## 2023-11-18 NOTE — Progress Notes (Signed)
 Mobility Specialist Progress Note:    11/18/23 0942  Mobility  Activity Ambulated with assistance  Level of Assistance Contact guard assist, steadying assist  Assistive Device Front wheel walker  Distance Ambulated (ft) 25 ft  Range of Motion/Exercises Active;All extremities  RLE Weight Bearing Per Provider Order WBAT  LLE Weight Bearing Per Provider Order WBAT  Activity Response Tolerated well  Mobility visit 1 Mobility  Mobility Specialist Start Time (ACUTE ONLY) 0908  Mobility Specialist Stop Time (ACUTE ONLY) 0935  Mobility Specialist Time Calculation (min) (ACUTE ONLY) 27 min   Pt received in bed, agreeable to mobility. CAM boot placed prior to mobility, required CGA to stand and ambulate with RW. Tolerated well, BLE WBAT with heel favor on LLE. Limited session today d/t urinary incontinence and necessary peri care. Returned supine, alarm on. Belongings in reach, all needs were met.  Sherrilee Ditty Mobility Specialist Please contact via Special educational needs teacher or  Rehab office at [phone]

## 2023-11-18 NOTE — TOC Transition Note (Signed)
 Transition of Care [facility]) - Discharge Note   Patient Details  Name: Garrett Yoder. MRN: [id number] Date of Birth: 04-Apr-1945  Transition of Care [facility]) CM/SW Contact:  Lauraine JAYSON Carpen, LCSW Phone Number: 11/18/2023, 11:28 AM   Clinical Narrative:   Patient has orders to discharge to [facility] today. RN will call report to [phone] (Room 607). [facility] set up for 1:00. No further concerns. CSW signing off.  Final next level of care: Skilled Nursing Facility Barriers to Discharge: Barriers Resolved   Patient Goals and CMS Choice Patient states their goals for this hospitalization and ongoing recovery are:: rehab and then return home CMS Medicare.gov Compare Post Acute Care list provided to:: Patient Choice offered to / list presented to : Patient, Sibling      Discharge Placement PASRR number recieved: 11/17/23            Patient chooses bed at: [facility] Patient to be transferred to facility by: [facility]   Patient and family notified of of transfer: 11/18/23  Discharge Plan and Services Additional resources added to the After Visit Summary for   In-house Referral: Clinical Social Work                                   Social Drivers of Health (SDOH) Interventions SDOH Screenings   Food Insecurity: No Food Insecurity (11/15/2023)  Housing: Low Risk  (11/15/2023)  Transportation Needs: No Transportation Needs (11/15/2023)  Utilities: Not At Risk (11/15/2023)  Alcohol Screen: Low Risk  (10/15/2023)  Depression (PHQ2-9): Low Risk  (10/15/2023)  Financial Resource Strain: Low Risk  (10/15/2023)  Physical Activity: Insufficiently Active (10/15/2023)  Social Connections: Moderately Isolated (11/15/2023)  Stress: No Stress Concern Present (10/15/2023)  Tobacco Use: Medium Risk (11/13/2023)  Health Literacy: Adequate Health Literacy (10/15/2023)     Readmission Risk Interventions     No data to  display

## 2023-11-18 NOTE — TOC Progression Note (Signed)
 Transition of Care (TOC) - Progression Note    Patient Details  Name: Garrett Yoder. MRN: [id number] Date of Birth: [birth year]  [facility] Care [facility]) CM/SW Contact  Lauraine JAYSON Carpen, LCSW Phone Number: 11/18/2023, 10:26 AM  Clinical Narrative:  Auth approved: [id number]. Valid 10/1-10/3. [facility] can accept patient today if stable. Sent secure chat to MD and RN to notify.   Expected Discharge Plan: Skilled Nursing Facility Barriers to Discharge: Continued Medical Work up               Expected Discharge Plan and Services In-house Referral: Clinical Social Work     Living arrangements for the past 2 months: Single Family Home                                       Social Drivers of Health (SDOH) Interventions SDOH Screenings   Food Insecurity: No Food Insecurity (11/15/2023)  Housing: Low Risk  (11/15/2023)  Transportation Needs: No Transportation Needs (11/15/2023)  Utilities: Not At Risk (11/15/2023)  Alcohol Screen: Low Risk  (10/15/2023)  Depression (PHQ2-9): Low Risk  (10/15/2023)  Financial Resource Strain: Low Risk  (10/15/2023)  Physical Activity: Insufficiently Active (10/15/2023)  Social Connections: Moderately Isolated (11/15/2023)  Stress: No Stress Concern Present (10/15/2023)  Tobacco Use: Medium Risk (11/13/2023)  Health Literacy: Adequate Health Literacy (10/15/2023)    Readmission Risk Interventions     No data to display

## 2023-11-18 NOTE — Progress Notes (Signed)
   11/18/23 0055  Vitals  Temp 98.2 F (36.8 C)  Temp Source Oral  BP 110/71  MAP (mmHg) 84  BP Location Left Leg  BP Method Automatic  Patient Position (if appropriate) Lying  Pulse Rate 96  Pulse Rate Source Monitor  Resp 20  Level of Consciousness  Level of Consciousness Alert  Oxygen Therapy  SpO2 97 %  O2 Device Room Air  Patient Activity (if Appropriate) In bed  Pulse Oximetry Type Intermittent

## 2023-11-18 NOTE — Plan of Care (Signed)
  Problem: Education: Goal: Knowledge of General Education information will improve Description: Including pain rating scale, medication(s)/side effects and non-pharmacologic comfort measures Outcome: Progressing   Problem: Clinical Measurements: Goal: Will remain free from infection Outcome: Progressing Goal: Respiratory complications will improve Outcome: Progressing   Problem: Activity: Goal: Risk for activity intolerance will decrease Outcome: Progressing   Problem: Pain Managment: Goal: General experience of comfort will improve and/or be controlled Outcome: Progressing   Problem: Clinical Measurements: Goal: Cardiovascular complication will be avoided Outcome: Not Progressing

## 2023-11-18 NOTE — Discharge Summary (Signed)
 Physician Discharge Summary   Patient: Garrett Yoder. MRN: [id number] DOB: 04-Aug-1945  Admit date:     11/13/2023  Discharge date: 11/18/23  Discharge Physician: Leita Blanch   PCP: [name], FNP   Recommendations at discharge:    F/u PCP after d/c from rehab  Discharge Diagnoses: Principal Problem:   Acute metabolic encephalopathy Active Problems:   Syncope and collapse   Atrial fibrillation with RVR (HCC)   Uncontrolled type 2 diabetes mellitus with hypoglycemia, with long-term current use of insulin  (HCC)   Closed nondisplaced fracture of fourth and fifth left metatarsal bone   Leukocytosis   Fracture of one rib of left side, presumed acute   Acute urinary retention   Acute hypoxic respiratory failure (HCC)   Pulmonary edema   Atrial fibrillation with rapid ventricular response (HCC)   Atrial flutter with rapid ventricular response (HCC)   Closed nondisplaced fracture of fourth metatarsal bone of left foot Garrett Yoder. is a 78 y.o. male with medical history significant for Permanent A-fib on Eliquis , DM, HTN, BPH, CKD lllb, being admitted with A-fib with RVR and  symptomatic hypoglycemia/encephalopathy resulting in a fall with left metatarsal fractures.  Patient was found on the floor by his sister, semiresponsive.  Patient is now awake and alert, recently had his insulin  dose decreased by his endocrinologist who he saw on 9/23.  He had apparently been running hypoglycemic between 10 PM and 2 AM based on blood sugars uploaded from his Dexcom.     EKG showing A-fib at 130 Trauma imaging which included CT head and C-spine left rib series and x-ray foot significant for age-indeterminate fifth left rib fracture and acute fractures 4th and 5th left metatarsals   Atrial fibrillation with rapid ventricular rate --Patient with a known history of A-fib --Received a one-time dose of IV diltiazem  in the ER with initial improvement in his heart rate and then had a recurrence  of RVR --Patient was started on a Cardizem  drip and has since been transitioned to oral Cardizem . --Continue metoprolol , digoxin  and cardizem  --Continue Eliquis  as primary prophylaxis for an acute stroke --Appreciate [facility] cardiology input   Acute hypoxic respiratory failure-- resolved Acute pulmonary edema Acute diastolic dysfunction CHF Noted to have room air pulse oximetry of 88% with shortness of breath requiring oxygen supplementation to maintain pulse oximetry greater than 92% --Chest x-ray showed findings suggestive of pulmonary edema --Last 2D echocardiogram from 03/25 shows an LVEF of 55 to 60% with normal LV function Optimize rate control --Patient received 3 doses of IV Lasix  has been weaned off oxygen.   Uncontrolled type 2 diabetes mellitus with hypoglycemia, with long-term current use of insulin  (HCC) Acute metabolic encephalopathy secondary to hypoglycemia Syncope and collapse secondary to hypoglycemia --Patient was found semiresponsive on the floor by his sister --Was hypoglycemic upon presentation with blood sugar 43 --Blood sugars have remained over 200 --Hemoglobin A1c is 6.4. --Start Lantus  +SSI -- given renal insufficiency and CHF will hold off on Actos and metformin  at discharge -- patient follows with endocrinology Dr. Damian as outpatient   Fracture of one rib of left side, presumed acute --Incentive spirometer --Pain control   Closed nondisplaced fracture of fourth and fifth left metatarsal bone Pain right foot --Appreciate podiatry input--Dr Ethan Saddler --Recommends non operative treatment for the left foot.  -Recommend patient use a walking boot however and can weight bear as tolerated using the boot but should favor heel  --Acewrap for support to the right ankle --  Appreciate PT eval, recommend SNF for subacute rehab on admission    Acute urinary retention --Patient with difficulty voiding and abdominal pain on admission (09/27) --Bladder scan yielded  500 mL of urine --Foley catheter was placed --Continue Flomax  -- Foley removed. Patient voiding okay   Patient medically stable for discharge to [facility]    Family communication :none today Consults : [facility] cardiology CODE STATUS: full DVT Prophylaxis : eliquis        Consultants: cardiology Disposition: Rehabilitation facility Diet recommendation:  Discharge Diet Orders (From admission, onward)     Start     Ordered   11/18/23 0000  Diet - low sodium heart healthy        11/18/23 1054           Cardiac and Carb modified diet DISCHARGE MEDICATION: Allergies as of 11/18/2023       Reactions   Codeine Nausea Only   Other reaction(s): makes him sick to his stomach Other reaction(s): makes him sick to his stomach        Medication List     STOP taking these medications    metFORMIN  1000 MG tablet Commonly known as: GLUCOPHAGE    pioglitazone 15 MG tablet Commonly known as: ACTOS   Simbrinza 1-0.2 % Susp Generic drug: Brinzolamide -Brimonidine        TAKE these medications    atorvastatin  40 MG tablet Commonly known as: LIPITOR TAKE 1 TABLET BY MOUTH DAILY   bimatoprost 0.01 % Soln Commonly known as: LUMIGAN Place 1 drop into both eyes at bedtime.   digoxin  0.125 MG tablet Commonly known as: LANOXIN  TAKE 1/2 TABLET BY MOUTH EVERY DAY   diltiazem  120 MG 24 hr capsule Commonly known as: CARDIZEM  CD Take 1 capsule (120 mg total) by mouth daily. Start taking on: November 19, 2023   Eliquis  5 MG Tabs tablet Generic drug: apixaban  TAKE 1 TABLET BY MOUTH TWICE  DAILY   insulin  lispro 100 UNIT/ML KwikPen Commonly known as: HUMALOG Inject into the skin daily. Pt taking 4 units in AM, 12 units at noon   Lantus  SoloStar 100 UNIT/ML Solostar Pen Generic drug: insulin  glargine Inject 15 Units into the skin at bedtime.   lidocaine  5 % Commonly known as: LIDODERM  Place 1 patch onto the skin daily. Remove & Discard patch within 12 hours or as  directed by MD Start taking on: November 19, 2023   metoprolol  succinate 100 MG 24 hr tablet Commonly known as: TOPROL -XL TAKE 1 TABLET BY MOUTH  DAILY . TAKE WITH OR IMMEDIATELY FOLLOWING A MEAL   Multi-Vitamins Tabs Take 1 tablet by mouth 2 (two) times daily.   tamsulosin  0.4 MG Caps capsule Commonly known as: FLOMAX  TAKE 1 CAPSULE BY MOUTH DAILY        Contact information for follow-up providers     [name], FNP Follow up in 2 week(s).   Specialty: Nurse Practitioner Why: after discharge from rehab Contact information: [address] [place] [address] [phone]              Contact information for after-discharge care     Destination     [facility] .   Service: Skilled Nursing Contact information: [address] [place]  [address] [phone]                    Discharge Exam: Filed Weights   11/16/23 9453 11/17/23 9495 11/18/23 9490  Weight: 59.5 kg 56.3 kg 56.5 kg   Ace wrap left LE  Condition at discharge: fair  The results of significant diagnostics from this hospitalization (including imaging, microbiology, ancillary and laboratory) are listed below for reference.   Imaging Studies: DG Chest Port 1 View Result Date: 11/14/2023 CLINICAL DATA:  Shortness of breath, atrial fibrillation EXAM: PORTABLE CHEST 1 VIEW COMPARISON:  11/13/2023 FINDINGS: Single frontal view of the chest demonstrates an unremarkable cardiac silhouette. There is pulmonary vascular congestion, with developing basilar predominant interstitial and ground-glass opacities favoring edema. No effusion or pneumothorax. No acute bony abnormalities. IMPRESSION: 1. Findings consistent with mild volume overload and developing pulmonary edema. Electronically Signed   By: Ozell Daring M.D.   On: 11/14/2023 12:20   DG Foot Complete Right Result Date: 11/14/2023 EXAM: 3 OR  MORE VIEW(S) XRAY OF THE RIGHT FOOT 11/14/2023 03:32:08 AM COMPARISON: None available. CLINICAL HISTORY: Right foot injury, initial encounter. FINDINGS: BONES AND JOINTS: No acute fracture. Small inferior and posterior calcaneal spurs. Mild degenerative changes in the midfoot and 1st MTP joint. No joint dislocation. SOFT TISSUES: The soft tissues are unremarkable. IMPRESSION: 1. No acute fracture or dislocation. 2. Mild degenerative changes in the midfoot and first MTP joint. 3. Small inferior and posterior calcaneal spurs. Electronically signed by: Norman Gatlin MD 11/14/2023 03:50 AM EDT RP Workstation: [id number]   CT Cervical Spine Wo Contrast Result Date: 11/13/2023 CLINICAL DATA:  Unwitnessed fall. EXAM: CT CERVICAL SPINE WITHOUT CONTRAST TECHNIQUE: Multidetector CT imaging of the cervical spine was performed without intravenous contrast. Multiplanar CT image reconstructions were also generated. RADIATION DOSE REDUCTION: This exam was performed according to the departmental dose-optimization program which includes automated exposure control, adjustment of the mA and/or kV according to patient size and/or use of iterative reconstruction technique. COMPARISON:  May 09, 2007 FINDINGS: Alignment: Normal. Skull base and vertebrae: No acute fracture. A metallic density fusion plate and screws are seen along the anterior aspects of the C4, C5, C6 and C7 vertebral bodies. This represents a new finding when compared to the prior exam. Soft tissues and spinal canal: No prevertebral fluid or swelling. No visible canal hematoma. Disc levels: Multilevel endplate sclerosis and postoperative changes consistent with surgical fusion are seen at the levels of C4-C5, C5-C6 and C6-C7. Marked severity posterior bony spurring is also seen at these levels. Bilateral marked severity multilevel facet joint hypertrophy is noted. Upper chest: Negative. Other: A 7 mm benign-appearing well-defined sclerotic focus is seen within the  medial aspect of the first left rib (axial CT image 86, CT series 2). This is stable in appearance when compared to the prior chest CT, dated December 18, 2020. IMPRESSION: 1. No acute fracture or subluxation in the cervical spine. 2. Postoperative changes consistent with prior surgical fusion at the levels of C4-C5, C5-C6 and C6-C7. 3. Marked severity multilevel degenerative changes. Electronically Signed   By: Suzen Dials M.D.   On: 11/13/2023 20:58   CT Head Wo Contrast Result Date: 11/13/2023 CLINICAL DATA:  Unwitnessed fall. EXAM: CT HEAD WITHOUT CONTRAST TECHNIQUE: Contiguous axial images were obtained from the base of the skull through the vertex without intravenous contrast. RADIATION DOSE REDUCTION: This exam was performed according to the departmental dose-optimization program which includes automated exposure control, adjustment of the mA and/or kV according to patient size and/or use of iterative reconstruction technique. COMPARISON:  April 26, 2023 FINDINGS: Brain: There is generalized cerebral atrophy with widening of the extra-axial spaces and ventricular dilatation. There are areas of decreased  attenuation within the white matter tracts of the supratentorial brain, consistent with microvascular disease changes. Vascular: Moderate to marked severity bilateral cavernous carotid artery calcification is noted. Skull: Normal. Negative for fracture or focal lesion. Sinuses/Orbits: No acute finding. Other: None. IMPRESSION: 1. Generalized cerebral atrophy with chronic white matter small vessel ischemic changes. 2. No acute intracranial abnormality. Electronically Signed   By: Suzen Dials M.D.   On: 11/13/2023 20:51   DG Ribs Unilateral W/Chest Left Result Date: 11/13/2023 CLINICAL DATA:  Status post fall. EXAM: LEFT RIBS AND CHEST - 3+ VIEW COMPARISON:  April 24, 2023 FINDINGS: An ill-defined fracture of indeterminate age is suspected involving the fifth left rib. Chronic six, seventh and  eighth left rib deformities are noted. Postoperative changes are seen within the visualized portion of the cervical spine. There is no evidence of pneumothorax or pleural effusion. Both lungs are clear. Marked severity calcification of the aortic arch is noted. Heart size and mediastinal contours are within normal limits. IMPRESSION: 1. Ill-defined fracture of indeterminate age involving the fifth left rib. Correlation with physical examination is recommended to determine the presence of point tenderness. 2. Chronic sixth, seventh and eighth left rib deformities. Electronically Signed   By: Suzen Dials M.D.   On: 11/13/2023 20:49   DG Foot Complete Left Result Date: 11/13/2023 CLINICAL DATA:  Unwitnessed fall. EXAM: LEFT FOOT - COMPLETE 3+ VIEW COMPARISON:  None Available. FINDINGS: Acute fracture deformities are seen involving the distal aspects of the fourth and fifth left metatarsals. There is no evidence of dislocation. Mild degenerative changes are seen within the mid left foot. A small to moderate sized plantar calcaneal spur is present. Small superficial soft tissue defects and mild soft tissue swelling is seen along the lateral aspect of the distal left foot, adjacent to the previously noted fracture sites. IMPRESSION: Acute fractures of the distal aspects of the fourth and fifth left metatarsals. Electronically Signed   By: Suzen Dials M.D.   On: 11/13/2023 18:31    Microbiology: Results for orders placed or performed during the hospital encounter of 11/13/23  Resp panel by RT-PCR (RSV, Flu A&B, Covid) Anterior Nasal Swab     Status: None   Collection Time: 11/13/23 12:02 AM   Specimen: Anterior Nasal Swab  Result Value Ref Range Status   SARS Coronavirus 2 by RT PCR NEGATIVE NEGATIVE Final    Comment: (NOTE) SARS-CoV-2 target nucleic acids are NOT DETECTED.  The SARS-CoV-2 RNA is generally detectable in upper respiratory specimens during the acute phase of infection. The  lowest concentration of SARS-CoV-2 viral copies this assay can detect is 138 copies/mL. A negative result does not preclude SARS-Cov-2 infection and should not be used as the sole basis for treatment or other patient management decisions. A negative result may occur with  improper specimen collection/handling, submission of specimen other than nasopharyngeal swab, presence of viral mutation(s) within the areas targeted by this assay, and inadequate number of viral copies(<138 copies/mL). A negative result must be combined with clinical observations, patient history, and epidemiological information. The expected result is Negative.  Fact Sheet for Patients:  BloggerCourse.com  Fact Sheet for Healthcare Providers:  SeriousBroker.it  This test is no t yet approved or cleared by the [place]  FDA and  has been authorized for detection and/or diagnosis of SARS-CoV-2 by FDA under an Emergency Use Authorization (EUA). This EUA will remain  in effect (meaning this test can be used) for the duration of the COVID-19 declaration under Section 564(b)(1)  of the Act, 21 U.S.C.section 360bbb-3(b)(1), unless the authorization is terminated  or revoked sooner.       Influenza A by PCR NEGATIVE NEGATIVE Final   Influenza B by PCR NEGATIVE NEGATIVE Final    Comment: (NOTE) The Xpert Xpress SARS-CoV-2/FLU/RSV plus assay is intended as an aid in the diagnosis of influenza from Nasopharyngeal swab specimens and should not be used as a sole basis for treatment. Nasal washings and aspirates are unacceptable for Xpert Xpress SARS-CoV-2/FLU/RSV testing.  Fact Sheet for Patients: BloggerCourse.com  Fact Sheet for Healthcare Providers: SeriousBroker.it  This test is not yet approved or cleared by the [place]  FDA and has been authorized for detection and/or diagnosis of SARS-CoV-2 by FDA under  an Emergency Use Authorization (EUA). This EUA will remain in effect (meaning this test can be used) for the duration of the COVID-19 declaration under Section 564(b)(1) of the Act, 21 U.S.C. section 360bbb-3(b)(1), unless the authorization is terminated or revoked.     Resp Syncytial Virus by PCR NEGATIVE NEGATIVE Final    Comment: (NOTE) Fact Sheet for Patients: BloggerCourse.com  Fact Sheet for Healthcare Providers: SeriousBroker.it  This test is not yet approved or cleared by the [place]  FDA and has been authorized for detection and/or diagnosis of SARS-CoV-2 by FDA under an Emergency Use Authorization (EUA). This EUA will remain in effect (meaning this test can be used) for the duration of the COVID-19 declaration under Section 564(b)(1) of the Act, 21 U.S.C. section 360bbb-3(b)(1), unless the authorization is terminated or revoked.  Performed at [facility], [address] [place], [place] [address]     Labs: CBC: Recent Labs  Lab 11/13/23 1756 11/15/23 1010 11/17/23 0519  WBC 12.4* 14.1* 10.1  NEUTROABS 10.6*  --   --   HGB 10.8* 11.2* 10.8*  HCT 34.0* 34.7* 32.8*  MCV 96.6 93.8 93.2  PLT 476* 539* 512*   Basic Metabolic Panel: Recent Labs  Lab 11/13/23 1756 11/15/23 1010 11/17/23 0519  NA 139 140 136  K 4.1 3.7 3.6  CL 105 102 98  CO2 21* 22 28  GLUCOSE 120* 196* 238*  BUN 21 23 37*  CREATININE 1.57* 1.44* 1.65*  CALCIUM  9.0 9.2 8.7*  MG  --  1.9  --    Liver Function Tests: Recent Labs  Lab 11/13/23 1756  AST 42*  ALT 39  ALKPHOS 111  BILITOT 0.5  PROT 7.1  ALBUMIN 4.0   CBG: Recent Labs  Lab 11/17/23 1154 11/17/23 1203 11/17/23 1635 11/17/23 2058 11/18/23 0738  GLUCAP 378* 372* 83 238* 274*    Discharge time spent: greater than 30 minutes.  Signed: Leita Blanch, MD [facility] 11/18/2023

## 2023-11-18 NOTE — Progress Notes (Signed)
 Report called to Jon at [facility]. D/c instructions in packet for facility. Patient expressed understanding and prepared for transfer once transport is here.

## 2023-11-30 ENCOUNTER — Encounter: Payer: Self-pay | Admitting: Cardiology

## 2023-11-30 ENCOUNTER — Ambulatory Visit: Attending: Cardiology | Admitting: Cardiology

## 2023-11-30 VITALS — BP 130/60 | HR 71 | Ht 68.0 in | Wt 133.0 lb

## 2023-11-30 DIAGNOSIS — I2510 Atherosclerotic heart disease of native coronary artery without angina pectoris: Secondary | ICD-10-CM | POA: Diagnosis not present

## 2023-11-30 DIAGNOSIS — I4821 Permanent atrial fibrillation: Secondary | ICD-10-CM | POA: Diagnosis not present

## 2023-11-30 DIAGNOSIS — I10 Essential (primary) hypertension: Secondary | ICD-10-CM

## 2023-11-30 DIAGNOSIS — N1832 Chronic kidney disease, stage 3b: Secondary | ICD-10-CM

## 2023-11-30 DIAGNOSIS — E782 Mixed hyperlipidemia: Secondary | ICD-10-CM

## 2023-11-30 DIAGNOSIS — Z794 Long term (current) use of insulin: Secondary | ICD-10-CM

## 2023-11-30 DIAGNOSIS — E1165 Type 2 diabetes mellitus with hyperglycemia: Secondary | ICD-10-CM

## 2023-11-30 NOTE — Progress Notes (Signed)
 Cardiology Office Note   Date:  11/30/2023  ID:  [name], DOB 1945-11-06, MRN [id number] PCP: [name], FNP  [facility] Providers Cardiologist:  Deatrice Cage, MD Electrophysiologist:  OLE ONEIDA HOLTS, MD  Electrophysiology APP:  Riddle, Suzann, NP     History of Present Illness Kymani Shimabukuro. is a 78 y.o. male with permanent atrial fibrillation, coronary calcifications, aortic atherosclerosis, hyperlipidemia, CKD stage III, type 2 diabetes, hypertension, who has been seen and evaluated after recent discharge from the hospital.   Establish care with Dr. Cage in December 2018 following imaging that showed aortic atherosclerosis.  Stress testing at that time showed low risk without ischemia or infarct with normal LVEF.  History of atrial fibrillation diagnosed in November 2023.  Echocardiogram in December 2023 revealed an LVEF of 50-55%, no RWMA, normal RV SF, mildly dilated right atrium, mild MR, mild TR.  Underwent cardioversion on February 27, 2022 with return of atrial fibrillation on January 16.  He saw Dr. HOLTS from EP in February 2024 and was asymptomatic from the A-fib perspective.  Rate controlled is agreed upon.  He was continued on Toprol  and digoxin  as well as apixaban  for stroke prophylaxis.  He was evaluated in the emergency department 04/26/2023 with hyperglycemia.  Blood sugars were in the 300s.  He was not lightheaded.  Denied any chest pain or shortness of breath.  He was noted to be in A-fib with RVR to be placed on a diltiazem  drip that was exacerbated by rhinovirus.  Diltiazem  drip was able to be titrated off on 04/27/2023.  Echocardiogram was repeated without any new changes.  He was able to be discharged home on 04/28/2023 with follow-up in A-fib clinic.  He was hospitalized at [facility] from 11/12/08/1/25 for acute metabolic encephalopathy, syncope with collapse, atrial fibrillation with RVR, and uncontrolled type 2 diabetes with hypoglycemia.   He was found on the floor by his sister; unresponsive.  He recently had his insulin  dose decreased by his endocrinologist to his home on 11/10/2023.  He has previously been running hypoglycemic between 10 PM and 2 AM based on blood sugars uploaded from his Dexcom.  EKG revealed atrial fibrillation with a rate of 130.  Trauma imaging revealed a CT of the head and C-spine with left rib series and x-ray of the foot significant for age-indeterminate left fifth rib fracture and acute fractures of the 4th and 5th left metatarsals.  Today's atrial fibrillation with RVR he received one-time dose of IV diltiazem  in the ER and initial improvement in his heart rate with reoccurrence of RVR.  He was started on a diltiazem  drip and transition to oral diltiazem  was continued on metoprolol , digoxin  and apixaban  for primary stroke prophylaxis.  He was treated with 3 doses of IV furosemide  this was able to be weaned off of oxygen therapy.  He was considered stable for discharge and was discharged to [facility] on 11/18/2023.   He returns clinic today stating for the cardiac standpoint he has been doing fairly well.  He denies any chest pain, shortness of breath, palpitations, peripheral edema.  He continues to remain in a walking boot on the left and a diabetic shoe on the right.  States that he has not had any further dizziness, lightheadedness or syncopal or near syncopal episodes.  Syncope with collapse was related to hypoglycemia with recent insulin  changes.  He has been undergoing physical therapy at [facility] and has been doing well and is awaiting  to return home.  States that he has been compliant with his current medication regimen without any undue side effects.  He has not missed any doses of his apixaban .  Denies any bleeding with no blood noted in his urine or stool.  ROS: 10 point review of systems has been reviewed and considered negative except was been listed in the HPI  Studies Reviewed EKG  Interpretation Date/Time:  Monday November 30 2023 10:32:48 EDT Ventricular Rate:  71 PR Interval:    QRS Duration:  70 QT Interval:  364 QTC Calculation: 395 R Axis:   82  Text Interpretation: Atrial fibrillation When compared with ECG of 13-Nov-2023 17:58, No acute changes Confirmed by Gerard Frederick ([id number]) on 11/30/2023 10:35:16 AM    2d echo 04/27/2023 1. Left ventricular ejection fraction, by estimation, is 55 to 60%. The  left ventricle has normal function. The left ventricle has no regional  wall motion abnormalities. Left ventricular diastolic parameters are  indeterminate.   2. Right ventricular systolic function is normal. The right ventricular  size is normal. There is normal pulmonary artery systolic pressure.   3. Left atrial size was mildly dilated.   4. The mitral valve is normal in structure. Mild mitral valve  regurgitation. No evidence of mitral stenosis.   5. The aortic valve is normal in structure. Aortic valve regurgitation is  not visualized. No aortic stenosis is present.   6. The inferior vena cava is dilated in size with >50% respiratory  variability, suggesting right atrial pressure of 8 mmHg.   Echo 2023 1. Left ventricular ejection fraction, by estimation, is 50 to 55%. The  left ventricle has low normal function. The left ventricle has no regional  wall motion abnormalities. Left ventricular diastolic parameters are  indeterminate.   2. Right ventricular systolic function is normal. The right ventricular  size is normal. There is normal pulmonary artery systolic pressure. The  estimated right ventricular systolic pressure is 34.2 mmHg.   3. Right atrial size was mildly dilated.   4. The mitral valve is normal in structure. Mild mitral valve  regurgitation. No evidence of mitral stenosis.   5. Tricuspid valve regurgitation is moderate.   6. The aortic valve is normal in structure. Aortic valve regurgitation is  not visualized. Aortic valve  sclerosis/calcification is present, without  any evidence of aortic stenosis.   7. The inferior vena cava is normal in size with greater than 50%  respiratory variability, suggesting right atrial pressure of 3 mmHg.   8. Rhythm is atrial fibrillation rate 90 to 130 bpm    Myoview  lexiscan 2018 Narrative & Impression  Blood pressure demonstrated a hypertensive response to exercise. Horizontal ST segment depression ST segment depression of 1 mm was noted during stress in the II, III, aVF, V5 and V6 leads. The study is normal. This is a low risk study. The left ventricular ejection fraction is normal (55-65%). Poor exercise capacity overall. He was held in stage I.   Risk Assessment/Calculations  CHA2DS2-VASc Score = 5   This indicates a 7.2% annual risk of stroke. The patient's score is based upon: CHF History: 0 HTN History: 1 Diabetes History: 1 Stroke History: 0 Vascular Disease History: 1 Age Score: 2 Gender Score: 0            Physical Exam VS:  BP 130/60 (BP Location: Left Arm, Patient Position: Sitting, Cuff Size: Normal)   Pulse 71   Ht 5' 8 (1.727 m)   Wt 133 lb (  60.3 kg)   SpO2 98%   BMI 20.22 kg/m        Wt Readings from Last 3 Encounters:  11/30/23 133 lb (60.3 kg)  11/18/23 124 lb 9 oz (56.5 kg)  09/11/23 134 lb 12.8 oz (61.1 kg)    GEN: Well nourished, well developed in no acute distress NECK: No JVD; No carotid bruits CARDIAC: IR IR, no murmurs, rubs, gallops RESPIRATORY:  Clear to auscultation without rales, wheezing or rhonchi  ABDOMEN: Soft, non-tender, non-distended EXTREMITIES:  No edema; No deformity , orthopedic shoe on the right foot walking boot on the left  ASSESSMENT AND PLAN Persistent atrial fibrillation diagnosed with atrial fibrillation in November 2023.  EKG today reveals rate controlled atrial fibrillation with a rate of 71.  He has been continued on-tablet of 0.125 mg digoxin , diltiazem  120 mg daily, Toprol -XL 100 mg daily and  apixaban  5 mg twice daily for CHA2DS2-VASc score of at least 5 for stroke prophylaxis.  Last echocardiogram completed in March 2025 revealed LVEF 55 to 60%, no RWMA.  Primary hypertension with a blood pressure today 130/60.  Is continued on his current medication regimen without changes made today.  He has been encouraged to continue to monitor pressures 1 to 2 hours postmedication administration as well.  Coronary artery calcifications on CT prior imaging with coronary calcifications: Stress testing in 2018 which was nonischemic.  He remains on apixaban  and lieu of aspirin  is continued on atorvastatin  40 mg daily.  No further ischemic evaluation needed at this time.  Chronic kidney disease stage IIIb 1.65 slightly up from baseline prior to hospital discharge.  Patient states he recently had labs drawn at the facility we have requested his most recent lab results from [facility].  If he has not had labs recently drawn he may need updated labs to ensure kidney function has returned to baseline.  Hyperlipidemia at 55 which remains at goal.  He has been continued on atorvastatin  40 mg daily.  Ongoing management per PCP.  Type 2 diabetes where he is continued on his insulin  sliding scale and Lantus  at bedtime.  Ongoing management per endocrinology.        Dispo: Patient to return to clinic to see MD/APP in 3 months or sooner if needed for further evaluation.  Signed, Beni Turrell, NP

## 2023-11-30 NOTE — Patient Instructions (Signed)
 Medication Instructions:  Your physician recommends that you continue on your current medications as directed. Please refer to the Current Medication list given to you today.   *If you need a refill on your cardiac medications before your next appointment, please call your pharmacy*  Lab Work: No labs ordered today  If you have labs (blood work) drawn today and your tests are completely normal, you will receive your results only by: MyChart Message (if you have MyChart) OR A paper copy in the mail If you have any lab test that is abnormal or we need to change your treatment, we will call you to review the results.  Testing/Procedures: No test ordered today   Follow-Up: At [facility], you and your health needs are our priority.  As part of our continuing mission to provide you with exceptional heart care, our providers are all part of one team.  This team includes your primary Cardiologist (physician) and Advanced Practice Providers or APPs (Physician Assistants and Nurse Practitioners) who all work together to provide you with the care you need, when you need it.  Your next appointment:   3 month(s)  Provider:   You may see Deatrice Cage, MD or one of the following Advanced Practice Providers on your designated Care Team:   Lonni Meager, NP Lesley Maffucci, PA-C Bernardino Bring, PA-C Cadence Columbus, PA-C Tylene Lunch, NP Barnie Hila, NP

## 2023-12-03 ENCOUNTER — Encounter: Payer: Self-pay | Admitting: Podiatry

## 2023-12-03 ENCOUNTER — Ambulatory Visit: Admitting: Podiatry

## 2023-12-03 DIAGNOSIS — S92302D Fracture of unspecified metatarsal bone(s), left foot, subsequent encounter for fracture with routine healing: Secondary | ICD-10-CM | POA: Diagnosis not present

## 2023-12-03 NOTE — Progress Notes (Signed)
 Subjective:  Patient ID: Garrett DELENA Loralee Mickey., male    DOB: 31-Dec-1945,  MRN: [id number]  Chief Complaint  Patient presents with   Foot Injury    Pt stated that he slipped and fell     78 y.o. male presents with the above complaint.  Patient presents with complaint left 4th and 5th metatarsal fracture.  Patient states that he slipped and fell and hurt his toe.  He had x-rays done and he has been in a facility since then.  He states is not causing him any pain pain scale 0 out of 10.  Wanted to get it evaluated.   Review of Systems: Negative except as noted in the HPI. Denies N/V/F/Ch.  Past Medical History:  Diagnosis Date   Anemia 08/14/2015   Anxiety    Aortic atherosclerosis 11/25/2016   Chest CT Oct 2018   Aorto-iliac atherosclerosis 11/25/2016   Abd/pelvic CT Oct 2018   Coronary artery calcification seen on CAT scan 11/25/2016   Chest CT Oct 2018; refer to Dr. Darron   DDD (degenerative disc disease), cervical 08/21/2016   Depression    Diabetes mellitus without complication (HCC)    Facet arthropathy, cervical 08/21/2016   GERD (gastroesophageal reflux disease)    Heart murmur    As child   History of echocardiogram    a. 01/2022 Echo: EF 50-55%, no rwma, nl RV fxn w/ RVSP 34.44mmHg, Mildly dil RA. Mild MR. Mod TR. AoV sclerosis w/o stenosis.   History of stress test    a. 01/2017 Ex MV: Poor ex capacity - held in stage I. 1mm Inflat ST depression. No ischemia/infarct. EF 55-65%.   Hyperlipidemia    Hypertension    Hypertrophy of prostate with urinary obstruction 11/25/2016   Pelvic CT October 2018   Incidental lung nodule, > 3mm and < 8mm 11/25/2016   5 mm nodule LLL, chest CT Oct 2018   Persistent atrial fibrillation (HCC)    a. 02/2022 Dx in 01/2022-->CHA2DS2VASc = 5-->Eliquis ; b. 02/2022 s/p DCCV (150J->200J->RSR).   Thrombocytosis 08/14/2015    Current Outpatient Medications:    apixaban  (ELIQUIS ) 5 MG TABS tablet, TAKE 1 TABLET BY MOUTH TWICE  DAILY, Disp: 200  tablet, Rfl: 1   atorvastatin  (LIPITOR) 40 MG tablet, TAKE 1 TABLET BY MOUTH DAILY, Disp: 100 tablet, Rfl: 2   bimatoprost (LUMIGAN) 0.01 % SOLN, Place 1 drop into both eyes at bedtime. , Disp: , Rfl:    digoxin  (LANOXIN ) 0.125 MG tablet, TAKE 1/2 TABLET BY MOUTH EVERY DAY, Disp: 45 tablet, Rfl: 2   diltiazem  (CARDIZEM  CD) 120 MG 24 hr capsule, Take 1 capsule (120 mg total) by mouth daily., Disp: 30 capsule, Rfl: 0   insulin  lispro (HUMALOG) 100 UNIT/ML KwikPen, Inject into the skin daily. Pt taking 4 units in AM, 12 units at noon, Disp: , Rfl:    LANTUS  SOLOSTAR 100 UNIT/ML Solostar Pen, Inject 15 Units into the skin at bedtime., Disp: , Rfl:    lidocaine  (LIDODERM ) 5 %, Place 1 patch onto the skin daily. Remove & Discard patch within 12 hours or as directed by MD, Disp: 30 patch, Rfl: 0   metoprolol  succinate (TOPROL -XL) 100 MG 24 hr tablet, TAKE 1 TABLET BY MOUTH  DAILY . TAKE WITH OR IMMEDIATELY FOLLOWING A MEAL, Disp: 180 tablet, Rfl: 3   Multiple Vitamin (MULTI-VITAMINS) TABS, Take 1 tablet by mouth 2 (two) times daily. , Disp: , Rfl:    tamsulosin  (FLOMAX ) 0.4 MG CAPS capsule, TAKE 1 CAPSULE BY  MOUTH DAILY, Disp: 60 capsule, Rfl: 5  Social History   Tobacco Use  Smoking Status Former   Current packs/day: 0.00   Average packs/day: 1 pack/day for 30.0 years (30.0 ttl pk-yrs)   Types: Cigarettes   Start date: 07/18/1969   Quit date: 07/19/1999   Years since quitting: 24.4  Smokeless Tobacco Never    Allergies  Allergen Reactions   Codeine Nausea Only    Other reaction(s): makes him sick to his stomach Other reaction(s): makes him sick to his stomach   Objective:  There were no vitals filed for this visit. There is no height or weight on file to calculate BMI. Constitutional Well developed. Well nourished.  Vascular Dorsalis pedis pulses palpable bilaterally. Posterior tibial pulses palpable bilaterally. Capillary refill normal to all digits.  No cyanosis or clubbing  noted. Pedal hair growth normal.  Neurologic Normal speech. Oriented to person, place, and time. Epicritic sensation to light touch grossly present bilaterally.  Dermatologic Nails well groomed and normal in appearance. No open wounds. No skin lesions.  Orthopedic: Pain on palpation left 4th and 5th metatarsal fracture no open wounds or lesion noted some ecchymosis noted.  The ecchymosis is resolving.  No compartment syndrome noted.   Radiographs: Acute fractures of the distal aspects of the fourth and fifth left metatarsals Assessment:   1. Closed fracture of head of metatarsal bone with routine healing, left    Plan:  Patient was evaluated and treated and all questions answered.  Left 4th and 5th metatarsal fracture nondisplaced - All questions and concerns were discussed with the patient in extensive detail at this time patient would benefit from continued use of cam boot to help with ambulation I encouraged him that he can be weightbearing as tolerated cam boot.  They are minimally displaced fracture therefore no surgery is indicated.  Discussed with patient he states understanding - In next 4 to 6 weeks he can go to regular shoes.  He can return to go home as well if he is able to ambulate with surgical shoe and a walker.  He states understanding  No follow-ups on file.   Left 4th and 5th metatarsal fracture nondisplaced no surgery needed weightbearing as tolerated in cam boot/surgical shoe.  He can return to go home from the facility if he is able to ambulate with surgical shoe and a walker

## 2023-12-04 ENCOUNTER — Telehealth: Payer: Self-pay | Admitting: Podiatry

## 2023-12-04 NOTE — Telephone Encounter (Signed)
 Patient called and would like to know if he can take his boot off to take a shower. His telephone number is [phone].

## 2023-12-07 ENCOUNTER — Telehealth: Payer: Self-pay

## 2023-12-07 NOTE — Telephone Encounter (Unsigned)
 Copied from CRM #[id number]. Topic: Clinical - Home Health Verbal Orders >> Dec 07, 2023  4:03 PM Garrett Yoder wrote: Caller/Agency: [facility] Callback Number: [phone] Service Requested: Skilled Nursing Frequency: 2x wk for 3 wk then 1 wk for 5 wks Any new concerns about the patient? Yes- sore on left lower extremity, weeping clear drainage- blood sugar all over the place

## 2023-12-08 NOTE — Telephone Encounter (Signed)
 Orders given, appt scheduled

## 2023-12-09 NOTE — Addendum Note (Signed)
 Addended by: Novalyn Lajara on: 12/09/2023 04:28 PM   Modules accepted: Level of Service

## 2023-12-10 ENCOUNTER — Ambulatory Visit (INDEPENDENT_AMBULATORY_CARE_PROVIDER_SITE_OTHER): Admitting: Nurse Practitioner

## 2023-12-10 ENCOUNTER — Encounter: Payer: Self-pay | Admitting: Nurse Practitioner

## 2023-12-10 VITALS — BP 126/76 | HR 64 | Resp 16 | Ht 68.0 in | Wt 132.6 lb

## 2023-12-10 DIAGNOSIS — Z794 Long term (current) use of insulin: Secondary | ICD-10-CM | POA: Diagnosis not present

## 2023-12-10 DIAGNOSIS — E1165 Type 2 diabetes mellitus with hyperglycemia: Secondary | ICD-10-CM

## 2023-12-10 DIAGNOSIS — I4891 Unspecified atrial fibrillation: Secondary | ICD-10-CM

## 2023-12-10 DIAGNOSIS — L0390 Cellulitis, unspecified: Secondary | ICD-10-CM | POA: Diagnosis not present

## 2023-12-10 MED ORDER — SULFAMETHOXAZOLE-TRIMETHOPRIM 800-160 MG PO TABS: 1.0000 | ORAL_TABLET | Freq: Two times a day (BID) | ORAL | 0 refills | Status: AC

## 2023-12-10 NOTE — Progress Notes (Signed)
 BP 126/76   Pulse 64   Resp 16   Ht 5' 8 (1.727 m)   Wt 132 lb 9.6 oz (60.1 kg)   SpO2 99%   BMI 20.16 kg/m    Subjective:    Patient ID: Garrett Yoder., male    DOB: [birth year], 78 y.o.   MRN: [id number]  HPI: Garrett Yoder. is a 78 y.o. male  Chief Complaint  Patient presents with   Leg Pain    sore on left lower extremity, weeping clear drainage   Discussed the use of AI scribe software for clinical note transcription with the patient, who gave verbal consent to proceed.  History of Present Illness Garrett Yoder is a 78 year old male with a history of acute metabolic encephalopathy, atrial fibrillation with RVR, and uncontrolled type 2 diabetes who presents with a left lower leg sore.  Left lower leg ulceration - Left lower leg sore developed after a ground-level fall on November 13, 2023 - Sore is currently bandaged and monitored for changes - yellow drainage noted, slight erythema, denies any systemic symptoms   Recent fall and associated injuries - Ground-level fall on November 13, 2023, resulting in fractures of the fourth and fifth left metatarsals - Experienced blackout at the time of the fall and was unable to move until assistance arrived - Hospitalized for two nights, followed by two weeks in a rehabilitation facility - Currently using a CAM boot for ambulation and advised to use weight-bearing as tolerated  Glycemic control - Type 2 diabetes with recent A1c of 7.1 on November 09, 2023 - Blood glucose levels have been elevated, attributed to concerns about hypoglycemia while living alone - Currently using Lantus  and a sliding scale insulin  regimen - Adherent to prescribed diabetes medications - Followed by endocrinology  Atrial fibrillation with rapid ventricular response - History of atrial fibrillation with rapid ventricular response, recently hospitalized for this condition - Followed by cardiology with a recent visit on November 30, 2023  Acute metabolic encephalopathy - Recent hospitalization from November 13, 2023, to November 18, 2023, for acute metabolic encephalopathy -reports doing well now that he is home -home health is coming to his home 2 times a week  Functional status and support - Lives alone and receives skilled nursing care at home twice a week for three weeks, then once a week for five weeks - Sister visits and provides assistance - Dog was boarded for over two weeks during rehabilitation stay          12/10/2023   12:51 PM 10/15/2023   12:49 PM 07/06/2023    1:08 PM  Depression screen PHQ 2/9  Decreased Interest 0 0 0  Down, Depressed, Hopeless 0 1 0  PHQ - 2 Score 0 1 0  Altered sleeping 0 0 0  Tired, decreased energy 0 0 0  Change in appetite 0 0 0  Feeling bad or failure about yourself  0 0 0  Trouble concentrating 0 0 0  Moving slowly or fidgety/restless 0 0   Suicidal thoughts 0 0 0  PHQ-9 Score 0 1 0  Difficult doing work/chores Not difficult at all Not difficult at all Not difficult at all    Relevant past medical, surgical, family and social history reviewed and updated as indicated. Interim medical history since our last visit reviewed. Allergies and medications reviewed and updated.  Review of Systems  Ten systems reviewed and is negative except as mentioned in  HPI      Objective:      BP 126/76   Pulse 64   Resp 16   Ht 5' 8 (1.727 m)   Wt 132 lb 9.6 oz (60.1 kg)   SpO2 99%   BMI 20.16 kg/m    Wt Readings from Last 3 Encounters:  12/10/23 132 lb 9.6 oz (60.1 kg)  11/30/23 133 lb (60.3 kg)  11/18/23 124 lb 9 oz (56.5 kg)    Physical Exam GENERAL: Alert, cooperative, well developed, no acute distress. HEENT: Normocephalic, normal oropharynx, moist mucous membranes. CHEST: Clear to auscultation bilaterally, no wheezes, rhonchi, or crackles. CARDIOVASCULAR: Normal heart rate and rhythm, S1 and S2 normal without murmurs. ABDOMEN: Soft, non-tender,  non-distended, without organomegaly, normal bowel sounds. EXTREMITIES: No cyanosis or edema. NEUROLOGICAL: Cranial nerves grossly intact, moves all extremities without gross motor or sensory deficit. SKIN: Wound culture performed on left lower leg sore.  Results for orders placed or performed during the hospital encounter of 11/13/23  Resp panel by RT-PCR (RSV, Flu A&B, Covid) Anterior Nasal Swab   Collection Time: 11/13/23 12:02 AM   Specimen: Anterior Nasal Swab  Result Value Ref Range   SARS Coronavirus 2 by RT PCR NEGATIVE NEGATIVE   Influenza A by PCR NEGATIVE NEGATIVE   Influenza B by PCR NEGATIVE NEGATIVE   Resp Syncytial Virus by PCR NEGATIVE NEGATIVE  Urinalysis, Complete w Microscopic -Urine, Clean Catch   Collection Time: 11/13/23 12:02 AM  Result Value Ref Range   Color, Urine YELLOW (A) YELLOW   APPearance HAZY (A) CLEAR   Specific Gravity, Urine 1.025 1.005 - 1.030   pH 5.0 5.0 - 8.0   Glucose, UA 150 (A) NEGATIVE mg/dL   Hgb urine dipstick NEGATIVE NEGATIVE   Bilirubin Urine NEGATIVE NEGATIVE   Ketones, ur NEGATIVE NEGATIVE mg/dL   Protein, ur 30 (A) NEGATIVE mg/dL   Nitrite NEGATIVE NEGATIVE   Leukocytes,Ua NEGATIVE NEGATIVE   RBC / HPF 11-20 0 - 5 RBC/hpf   WBC, UA 0-5 0 - 5 WBC/hpf   Bacteria, UA NONE SEEN NONE SEEN   Squamous Epithelial / HPF 0-5 0 - 5 /HPF   Mucus PRESENT    Hyaline Casts, UA PRESENT   CBG monitoring, ED   Collection Time: 11/13/23  5:51 PM  Result Value Ref Range   Glucose-Capillary 126 (H) 70 - 99 mg/dL  Comprehensive metabolic panel   Collection Time: 11/13/23  5:56 PM  Result Value Ref Range   Sodium 139 135 - 145 mmol/L   Potassium 4.1 3.5 - 5.1 mmol/L   Chloride 105 98 - 111 mmol/L   CO2 21 (L) 22 - 32 mmol/L   Glucose, Bld 120 (H) 70 - 99 mg/dL   BUN 21 8 - 23 mg/dL   Creatinine, Ser 8.42 (H) 0.61 - 1.24 mg/dL   Calcium  9.0 8.9 - 10.3 mg/dL   Total Protein 7.1 6.5 - 8.1 g/dL   Albumin 4.0 3.5 - 5.0 g/dL   AST 42 (H) 15 -  41 U/L   ALT 39 0 - 44 U/L   Alkaline Phosphatase 111 38 - 126 U/L   Total Bilirubin 0.5 0.0 - 1.2 mg/dL   GFR, Estimated 45 (L) >60 mL/min   Anion gap 13 5 - 15  CBC with Differential   Collection Time: 11/13/23  5:56 PM  Result Value Ref Range   WBC 12.4 (H) 4.0 - 10.5 K/uL   RBC 3.52 (L) 4.22 - 5.81 MIL/uL   Hemoglobin  10.8 (L) 13.0 - 17.0 g/dL   HCT 65.9 (L) 60.9 - 47.9 %   MCV 96.6 80.0 - 100.0 fL   MCH 30.7 26.0 - 34.0 pg   MCHC 31.8 30.0 - 36.0 g/dL   RDW 86.0 88.4 - 84.4 %   Platelets 476 (H) 150 - 400 K/uL   nRBC 0.0 0.0 - 0.2 %   Neutrophils Relative % 85 %   Neutro Abs 10.6 (H) 1.7 - 7.7 K/uL   Lymphocytes Relative 8 %   Lymphs Abs 1.0 0.7 - 4.0 K/uL   Monocytes Relative 5 %   Monocytes Absolute 0.6 0.1 - 1.0 K/uL   Eosinophils Relative 1 %   Eosinophils Absolute 0.1 0.0 - 0.5 K/uL   Basophils Relative 0 %   Basophils Absolute 0.0 0.0 - 0.1 K/uL   Immature Granulocytes 1 %   Abs Immature Granulocytes 0.13 (H) 0.00 - 0.07 K/uL  Troponin I (High Sensitivity)   Collection Time: 11/13/23  5:56 PM  Result Value Ref Range   Troponin I (High Sensitivity) 8 <18 ng/L  CBG monitoring, ED   Collection Time: 11/13/23 11:01 PM  Result Value Ref Range   Glucose-Capillary 157 (H) 70 - 99 mg/dL  CBG monitoring, ED   Collection Time: 11/13/23 11:59 PM  Result Value Ref Range   Glucose-Capillary 155 (H) 70 - 99 mg/dL  CBG monitoring, ED   Collection Time: 11/14/23  3:40 AM  Result Value Ref Range   Glucose-Capillary 151 (H) 70 - 99 mg/dL  Hemoglobin J8r   Collection Time: 11/14/23  5:57 AM  Result Value Ref Range   Hgb A1c MFr Bld 6.4 (H) 4.8 - 5.6 %   Mean Plasma Glucose 136.98 mg/dL  CBG monitoring, ED   Collection Time: 11/14/23  6:04 AM  Result Value Ref Range   Glucose-Capillary 199 (H) 70 - 99 mg/dL  CBG monitoring, ED   Collection Time: 11/14/23  8:47 AM  Result Value Ref Range   Glucose-Capillary 280 (H) 70 - 99 mg/dL  CK   Collection Time: 11/14/23  9:46  AM  Result Value Ref Range   Total CK 325 49 - 397 U/L  Troponin I (High Sensitivity)   Collection Time: 11/14/23  9:46 AM  Result Value Ref Range   Troponin I (High Sensitivity) 13 <18 ng/L  CBG monitoring, ED   Collection Time: 11/14/23 11:29 AM  Result Value Ref Range   Glucose-Capillary 306 (H) 70 - 99 mg/dL  CBG monitoring, ED   Collection Time: 11/14/23  4:46 PM  Result Value Ref Range   Glucose-Capillary 186 (H) 70 - 99 mg/dL  Glucose, capillary   Collection Time: 11/14/23  8:36 PM  Result Value Ref Range   Glucose-Capillary 182 (H) 70 - 99 mg/dL  Glucose, capillary   Collection Time: 11/15/23  7:53 AM  Result Value Ref Range   Glucose-Capillary 217 (H) 70 - 99 mg/dL  CBC   Collection Time: 11/15/23 10:10 AM  Result Value Ref Range   WBC 14.1 (H) 4.0 - 10.5 K/uL   RBC 3.70 (L) 4.22 - 5.81 MIL/uL   Hemoglobin 11.2 (L) 13.0 - 17.0 g/dL   HCT 65.2 (L) 60.9 - 47.9 %   MCV 93.8 80.0 - 100.0 fL   MCH 30.3 26.0 - 34.0 pg   MCHC 32.3 30.0 - 36.0 g/dL   RDW 86.0 88.4 - 84.4 %   Platelets 539 (H) 150 - 400 K/uL   nRBC 0.0 0.0 - 0.2 %  Basic metabolic panel   Collection Time: 11/15/23 10:10 AM  Result Value Ref Range   Sodium 140 135 - 145 mmol/L   Potassium 3.7 3.5 - 5.1 mmol/L   Chloride 102 98 - 111 mmol/L   CO2 22 22 - 32 mmol/L   Glucose, Bld 196 (H) 70 - 99 mg/dL   BUN 23 8 - 23 mg/dL   Creatinine, Ser 8.55 (H) 0.61 - 1.24 mg/dL   Calcium  9.2 8.9 - 10.3 mg/dL   GFR, Estimated 50 (L) >60 mL/min   Anion gap 16 (H) 5 - 15  Digoxin  level   Collection Time: 11/15/23 10:10 AM  Result Value Ref Range   Digoxin  Level 1.2 0.8 - 2.0 ng/mL  Magnesium   Collection Time: 11/15/23 10:10 AM  Result Value Ref Range   Magnesium 1.9 1.7 - 2.4 mg/dL  Glucose, capillary   Collection Time: 11/15/23 11:22 AM  Result Value Ref Range   Glucose-Capillary 203 (H) 70 - 99 mg/dL  Glucose, capillary   Collection Time: 11/15/23  5:01 PM  Result Value Ref Range   Glucose-Capillary  256 (H) 70 - 99 mg/dL  Glucose, capillary   Collection Time: 11/15/23  9:09 PM  Result Value Ref Range   Glucose-Capillary 225 (H) 70 - 99 mg/dL  Glucose, capillary   Collection Time: 11/16/23  8:10 AM  Result Value Ref Range   Glucose-Capillary 241 (H) 70 - 99 mg/dL  Glucose, capillary   Collection Time: 11/16/23 11:42 AM  Result Value Ref Range   Glucose-Capillary 273 (H) 70 - 99 mg/dL  Glucose, capillary   Collection Time: 11/16/23  4:50 PM  Result Value Ref Range   Glucose-Capillary 287 (H) 70 - 99 mg/dL  Glucose, capillary   Collection Time: 11/16/23  8:43 PM  Result Value Ref Range   Glucose-Capillary 275 (H) 70 - 99 mg/dL  CBC   Collection Time: 11/17/23  5:19 AM  Result Value Ref Range   WBC 10.1 4.0 - 10.5 K/uL   RBC 3.52 (L) 4.22 - 5.81 MIL/uL   Hemoglobin 10.8 (L) 13.0 - 17.0 g/dL   HCT 67.1 (L) 60.9 - 47.9 %   MCV 93.2 80.0 - 100.0 fL   MCH 30.7 26.0 - 34.0 pg   MCHC 32.9 30.0 - 36.0 g/dL   RDW 86.5 88.4 - 84.4 %   Platelets 512 (H) 150 - 400 K/uL   nRBC 0.0 0.0 - 0.2 %  Basic metabolic panel   Collection Time: 11/17/23  5:19 AM  Result Value Ref Range   Sodium 136 135 - 145 mmol/L   Potassium 3.6 3.5 - 5.1 mmol/L   Chloride 98 98 - 111 mmol/L   CO2 28 22 - 32 mmol/L   Glucose, Bld 238 (H) 70 - 99 mg/dL   BUN 37 (H) 8 - 23 mg/dL   Creatinine, Ser 8.34 (H) 0.61 - 1.24 mg/dL   Calcium  8.7 (L) 8.9 - 10.3 mg/dL   GFR, Estimated 43 (L) >60 mL/min   Anion gap 10 5 - 15  Glucose, capillary   Collection Time: 11/17/23  7:41 AM  Result Value Ref Range   Glucose-Capillary 241 (H) 70 - 99 mg/dL  Glucose, capillary   Collection Time: 11/17/23 11:54 AM  Result Value Ref Range   Glucose-Capillary 378 (H) 70 - 99 mg/dL  Glucose, capillary   Collection Time: 11/17/23 12:03 PM  Result Value Ref Range   Glucose-Capillary 372 (H) 70 - 99 mg/dL  Glucose, capillary  Collection Time: 11/17/23  4:35 PM  Result Value Ref Range   Glucose-Capillary 83 70 - 99 mg/dL   Glucose, capillary   Collection Time: 11/17/23  8:58 PM  Result Value Ref Range   Glucose-Capillary 238 (H) 70 - 99 mg/dL  Glucose, capillary   Collection Time: 11/18/23  7:38 AM  Result Value Ref Range   Glucose-Capillary 274 (H) 70 - 99 mg/dL  Glucose, capillary   Collection Time: 11/18/23 11:34 AM  Result Value Ref Range   Glucose-Capillary 204 (H) 70 - 99 mg/dL          Assessment & Plan:   Problem List Items Addressed This Visit       Cardiovascular and Mediastinum   Atrial fibrillation with rapid ventricular response (HCC)     Endocrine   Type 2 diabetes mellitus with hyperglycemia, with long-term current use of insulin  (HCC)   Other Visit Diagnoses       Wound cellulitis    -  Primary   Relevant Medications   sulfamethoxazole-trimethoprim (BACTRIM DS) 800-160 MG tablet   Other Relevant Orders   Wound culture        Assessment and Plan Assessment & Plan Left lower leg wound Sore on left lower leg appeared after a recent fall. Currently bandaged. Wound culture performed to identify bacteria. - Prescribe bactrim for 7 days - Instruct him to keep the wound clean and monitor for worsening - Send prescription to [facility] - Instruct him to call with updates on wound condition  Type 2 diabetes mellitus, uncontrolled Blood sugar levels running high intentionally to avoid hypoglycemia while alone. Currently on Lantus  and sliding scale insulin  regimen. Compliant with medication. - Continue current insulin  regimen with Lantus  and sliding scale -recommend follow up with ENDO        Follow up plan: Return in about 1 week (around 12/17/2023) for follow up.

## 2023-12-13 LAB — WOUND CULTURE
MICRO NUMBER:: 17144091
SPECIMEN QUALITY:: ADEQUATE

## 2023-12-14 ENCOUNTER — Ambulatory Visit: Payer: Self-pay | Admitting: Nurse Practitioner

## 2023-12-14 DIAGNOSIS — L0390 Cellulitis, unspecified: Secondary | ICD-10-CM

## 2023-12-14 MED ORDER — CEPHALEXIN 500 MG PO CAPS: 500.0000 mg | ORAL_CAPSULE | Freq: Four times a day (QID) | ORAL | 0 refills | Status: AC

## 2023-12-15 ENCOUNTER — Ambulatory Visit: Payer: Self-pay

## 2023-12-15 NOTE — Telephone Encounter (Signed)
 FYI Only or Action Required?: FYI only for provider.  Patient was last seen in primary care on 12/10/2023 by Yoder Mliss FALCON, FNP.  Called Nurse Triage reporting Advice Only.  Symptoms began a week ago.  Interventions attempted: Prescription medications: bactrim, cephalexin.  Symptoms are: not assessed.  Triage Disposition: Information or Advice Only Call  Patient/caregiver understands and will follow disposition?: Yes  Copied from CRM [id number]. Topic: Clinical - Medication Question >> Dec 15, 2023  2:39 PM Hadassah PARAS wrote: Reason for CRM: pt has questions on these medication; cephALEXin (KEFLEX) 500 MG capsule    sulfamethoxazole-trimethoprim (BACTRIM DS) 800-160 MG tablet Reason for Disposition  Caller has medicine question only, adult not sick, AND triager answers question  Answer Assessment - Initial Assessment Questions 1. NAME of MEDICINE: What medicine(s) are you calling about?     Cephalexin, bactrim 2. QUESTION: What is your question? (e.g., double dose of medicine, side effect)    Patient called to clarify which antibiotic he should take 3. PRESCRIBER: Who prescribed the medicine? Reason: if prescribed by specialist, call should be referred to that group.     Garrett Yoder 4. SYMPTOMS: Do you have any symptoms? If Yes, ask: What symptoms are you having?  How bad are the symptoms (e.g., mild, moderate, severe)     Wound infection   Explained to patient that according to his chart, he should take the Cephalexin.  He verbalized understanding  Protocols used: Medication Question Call-A-AH

## 2024-01-04 ENCOUNTER — Other Ambulatory Visit: Payer: Self-pay | Admitting: Cardiovascular Disease

## 2024-01-04 ENCOUNTER — Other Ambulatory Visit: Payer: Self-pay | Admitting: Nurse Practitioner

## 2024-01-04 DIAGNOSIS — I4821 Permanent atrial fibrillation: Secondary | ICD-10-CM

## 2024-01-05 NOTE — Telephone Encounter (Signed)
 Prescription refill request for Eliquis  received. Indication:afib Last office visit:10/25 Scr:1.65  9/25 Age: 78 Weight:60.1  kg  Prescription refilled

## 2024-01-06 ENCOUNTER — Encounter: Payer: Self-pay | Admitting: Nurse Practitioner

## 2024-01-06 ENCOUNTER — Ambulatory Visit (INDEPENDENT_AMBULATORY_CARE_PROVIDER_SITE_OTHER): Admitting: Nurse Practitioner

## 2024-01-06 VITALS — BP 132/80 | HR 66 | Temp 98.0°F | Ht 68.0 in | Wt 136.0 lb

## 2024-01-06 DIAGNOSIS — I700 Atherosclerosis of aorta: Secondary | ICD-10-CM

## 2024-01-06 DIAGNOSIS — E782 Mixed hyperlipidemia: Secondary | ICD-10-CM

## 2024-01-06 DIAGNOSIS — R351 Nocturia: Secondary | ICD-10-CM

## 2024-01-06 DIAGNOSIS — N1832 Chronic kidney disease, stage 3b: Secondary | ICD-10-CM

## 2024-01-06 DIAGNOSIS — I10 Essential (primary) hypertension: Secondary | ICD-10-CM

## 2024-01-06 DIAGNOSIS — E1165 Type 2 diabetes mellitus with hyperglycemia: Secondary | ICD-10-CM

## 2024-01-06 DIAGNOSIS — D473 Essential (hemorrhagic) thrombocythemia: Secondary | ICD-10-CM

## 2024-01-06 DIAGNOSIS — Z794 Long term (current) use of insulin: Secondary | ICD-10-CM

## 2024-01-06 DIAGNOSIS — N401 Enlarged prostate with lower urinary tract symptoms: Secondary | ICD-10-CM

## 2024-01-06 DIAGNOSIS — I4819 Other persistent atrial fibrillation: Secondary | ICD-10-CM

## 2024-01-06 NOTE — Progress Notes (Signed)
 BP 132/80   Pulse 66   Temp 98 F (36.7 C)   Ht 5' 8 (1.727 m)   Wt 136 lb (61.7 kg)   SpO2 98%   BMI 20.68 kg/m    Subjective:    Patient ID: Garrett DELENA Loralee Mickey., male    DOB: [birth year], 78 y.o.   MRN: [id number]  HPI: Garrett Scaife. is a 78 y.o. male  Chief Complaint  Patient presents with   Medical Management of Chronic Issues   Discussed the use of AI scribe software for clinical note transcription with the patient, who gave verbal consent to proceed.  History of Present Illness Garrett Yoder is a 78 year old male who presents for a six month follow-up.  Glycemic control and hypoglycemia - Last hemoglobin A1c in September 2025 was 6.4%. - Fluctuating blood glucose levels. - Concerns about hypoglycemia, particularly nocturnal episodes. - Expresses worry about risk of not waking up from hypoglycemia during sleep. - Currently on Lantus  15 units daily, and sliding scale with meals -managed by endocrinology  Cardiovascular disease management/atherosclerosis/atrial fibrillation/hypertension/hyperlipidemia - Currently on atorvastatin  40 mg daily, digoxin  62.5 mcg daily, diltiazem  120 mg daily, metoprolol  100 mg daily, and Eliquis  5 mg twice daily. - Last digoxin  level in September 2025 was within normal range. - Last lipid panel in May 2025 was within normal limits. -managed by cardiology  BP Readings from Last 3 Encounters:  01/06/24 132/80  12/10/23 126/76  11/30/23 130/60   Lipid Panel     Component Value Date/Time   CHOL 136 07/06/2023 1331   CHOL 155 07/24/2016 0829   TRIG 124 07/06/2023 1331   HDL 60 07/06/2023 1331   HDL 62 07/24/2016 0829   CHOLHDL 2.3 07/06/2023 1331   VLDL 22 08/14/2015 1532   LDLCALC 55 07/06/2023 1331   LABVLDL 14 07/24/2016 0829    Renal function - Last basic metabolic panel in September 2025 showed a GFR of 43. - Due for microalbumin urine test.  Benign prostatic hyperplasia and nocturia - Currently on  Flomax  0.4 mg daily for benign prostatic hyperplasia with nocturia.         01/06/2024    1:26 PM 12/10/2023   12:51 PM 10/15/2023   12:49 PM  Depression screen PHQ 2/9  Decreased Interest 0 0 0  Down, Depressed, Hopeless 0 0 1  PHQ - 2 Score 0 0 1  Altered sleeping 0 0 0  Tired, decreased energy 0 0 0  Change in appetite 0 0 0  Feeling bad or failure about yourself  0 0 0  Trouble concentrating 0 0 0  Moving slowly or fidgety/restless 0 0 0  Suicidal thoughts 0 0 0  PHQ-9 Score 0 0  1   Difficult doing work/chores  Not difficult at all Not difficult at all     Data saved with a previous flowsheet row definition    Relevant past medical, surgical, family and social history reviewed and updated as indicated. Interim medical history since our last visit reviewed. Allergies and medications reviewed and updated.  Review of Systems Constitutional: Negative for fever or weight change.  Respiratory: Negative for cough and shortness of breath.   Cardiovascular: Negative for chest pain or palpitations.  Gastrointestinal: Negative for abdominal pain, no bowel changes.  Musculoskeletal: Negative for gait problem or joint swelling.  Skin: Negative for rash.  Neurological: Negative for dizziness or headache.  No other specific complaints in a complete review of systems (  except as listed in HPI above).      Objective:      BP 132/80   Pulse 66   Temp 98 F (36.7 C)   Ht 5' 8 (1.727 m)   Wt 136 lb (61.7 kg)   SpO2 98%   BMI 20.68 kg/m    Wt Readings from Last 3 Encounters:  01/06/24 136 lb (61.7 kg)  12/10/23 132 lb 9.6 oz (60.1 kg)  11/30/23 133 lb (60.3 kg)    Physical Exam GENERAL: Alert, cooperative, well developed, no acute distress HEENT: Normocephalic, normal oropharynx, moist mucous membranes CHEST: Clear to auscultation bilaterally, no wheezes, rhonchi, or crackles CARDIOVASCULAR: Normal heart rate and rhythm, S1 and S2 normal without murmurs ABDOMEN: Soft,  non-tender, non-distended, without organomegaly, normal bowel sounds EXTREMITIES: No cyanosis or edema NEUROLOGICAL: Cranial nerves grossly intact, moves all extremities without gross motor or sensory deficit  Results for orders placed or performed in visit on 12/10/23  Wound culture   Collection Time: 12/10/23  1:49 PM   Specimen: Wound  Result Value Ref Range   MICRO NUMBER: [id number]    SPECIMEN QUALITY: Adequate    SOURCE: WOUND (SITE NOT SPECIFIED)    STATUS: FINAL    GRAM STAIN:      No white blood cells seen Moderate epithelial cells Many Gram positive cocci   ISOLATE 1: Staphylococcus aureus (A)    ISOLATE 2: Streptococcus agalactiae (A)       Susceptibility   Staphylococcus aureus - AEROBIC CULT, GRAM STAIN POSITIVE 1    VANCOMYCIN  1 Sensitive     CIPROFLOXACIN >=8 Resistant     CLINDAMYCIN <=0.25 Sensitive     LEVOFLOXACIN >=8 Resistant     ERYTHROMYCIN >=8 Resistant     GENTAMICIN <=0.5 Sensitive     OXACILLIN* 2 Sensitive      * Oxacillin susceptible staphylococci are susceptible to other penicillinase-stable penicillins (e.g., methicillin, nafcillin), beta- lactam/beta-lactamase inhibitor combinations, and cephems with staphylococcal indications, including cefazolin .     TETRACYCLINE >=16 Resistant     TRIMETH/SULFA >=320 Resistant     MOXIFLOXACIN* 4 Resistant      * Oxacillin susceptible staphylococci are susceptible to other penicillinase-stable penicillins (e.g., methicillin, nafcillin), beta- lactam/beta-lactamase inhibitor combinations, and cephems with staphylococcal indications, including cefazolin . Legend: S = Susceptible  I = Intermediate R = Resistant  NS = Not susceptible SDD = Susceptible Dose Dependent * = Not Tested  NR = Not Reported **NN = See Therapy Comments           Assessment & Plan:   Problem List Items Addressed This Visit       Cardiovascular and Mediastinum   Essential hypertension, benign   Aortic atherosclerosis -  Primary   Persistent atrial fibrillation (HCC)     Endocrine   Type 2 diabetes mellitus with hyperglycemia, with long-term current use of insulin  (HCC)   Relevant Orders   Microalbumin / creatinine urine ratio     Genitourinary   Chronic kidney disease, stage 3b (HCC)     Hematopoietic and Hemostatic   Essential (hemorrhagic) thrombocythemia (HCC)     Other   Hyperlipidemia   Benign prostatic hyperplasia with nocturia     Assessment and Plan Assessment & Plan Atherosclerosis of aorta Atorvastatin  40 mg daily  Persistent atrial fibrillation No new symptoms or changes in condition, managed by cardiology - Continue current medications: digoxin , diltiazem , Eliquis , metoprolol .  Essential hypertension - Continue current medications: diltiazem , metoprolol .  Type 2 diabetes mellitus with hyperglycemia  Blood sugars fluctuate, with concern about hypoglycemia. Managed by endocrinology. - Continue current medications: Lantus , sliding scale insulin . - Continue follow-up with endocrinology.  Chronic kidney disease, stage 3b GFR is 43, indicating stage 3b CKD. No new symptoms or changes in condition. - Ordered microalbumin urine test.  Essential thrombocythemia Managed by oncology. - Continue follow-up with oncology.  Mixed hyperlipidemia Managed with atorvastatin . - Continue atorvastatin  40 mg daily.  Benign prostatic hyperplasia with lower urinary tract symptoms Managed with Flomax . - Continue Flomax  0.4 mg daily.        Follow up plan: Return in about 6 months (around 07/05/2024) for follow up.

## 2024-01-07 LAB — MICROALBUMIN / CREATININE URINE RATIO
Creatinine, Urine: 80 mg/dL (ref 20–320)
Microalb Creat Ratio: 45 mg/g{creat} — ABNORMAL HIGH (ref <30)
Microalb, Ur: 3.6 mg/dL

## 2024-01-07 NOTE — Telephone Encounter (Signed)
 Requested Prescriptions  Refused Prescriptions Disp Refills   tamsulosin  (FLOMAX ) 0.4 MG CAPS capsule [Pharmacy Med Name: Tamsulosin  HCl 0.4 MG Oral Capsule] 100 capsule 2    Sig: TAKE 1 CAPSULE BY MOUTH DAILY     Urology: Alpha-Adrenergic Blocker Failed - 01/07/2024  1:33 PM      Failed - PSA in normal range and within 360 days    Prostate Specific Ag, Serum  Date Value Ref Range Status  11/14/2016 2.6 0.0 - 4.0 ng/mL Final    Comment:    Roche ECLIA methodology. According to the American Urological Association, Serum PSA should decrease and remain at undetectable levels after radical prostatectomy. The AUA defines biochemical recurrence as an initial PSA value 0.2 ng/mL or greater followed by a subsequent confirmatory PSA value 0.2 ng/mL or greater. Values obtained with different assay methods or kits cannot be used interchangeably. Results cannot be interpreted as absolute evidence of the presence or absence of malignant disease.          Passed - Last BP in normal range    BP Readings from Last 1 Encounters:  01/06/24 132/80         Passed - Valid encounter within last 12 months    Recent Outpatient Visits           Yesterday Aortic atherosclerosis   [facility] [name], FNP   4 weeks ago Wound cellulitis   [facility] [name], FNP   6 months ago Longstanding persistent atrial fibrillation [facility])   [facility] [name], FNP   7 months ago Essential hypertension, benign   [facility] [name], FNP       Future Appointments             In 1 month Gerard Frederick, NP [facility]

## 2024-01-08 ENCOUNTER — Ambulatory Visit: Payer: Self-pay

## 2024-01-08 ENCOUNTER — Ambulatory Visit: Payer: Self-pay | Admitting: Nurse Practitioner

## 2024-01-08 ENCOUNTER — Other Ambulatory Visit: Payer: Self-pay | Admitting: Nurse Practitioner

## 2024-01-08 DIAGNOSIS — R809 Proteinuria, unspecified: Secondary | ICD-10-CM | POA: Insufficient documentation

## 2024-01-08 MED ORDER — LISINOPRIL 2.5 MG PO TABS: 2.5000 mg | ORAL_TABLET | Freq: Every day | ORAL | 1 refills | Status: AC

## 2024-01-08 NOTE — Telephone Encounter (Signed)
 FYI Only or Action Required?: FYI only for provider: all questions answered.  Patient was last seen in primary care on 01/06/2024 by [name], FNP.  Called Nurse Triage reporting Advice Only.  Symptoms began n/a.  Interventions attempted: Other: n/a.  Symptoms are: n/a.  Triage Disposition: Information or Advice Only Call  Patient/caregiver understands and will follow disposition?: Yes     Copied from CRM #[id number]. Topic: Clinical - Medication Question >> Jan 08, 2024  3:46 PM Antwanette L wrote: Reason for CRM: Patient is requesting a callback regarding questions about a medication prescribed today. Patient does not recall the name of the medication. Please follow up with the patient at [phone] Reason for Disposition  General information question, no triage required and triager able to answer question  Answer Assessment - Initial Assessment Questions 1. REASON FOR CALL: What is the main reason for your call? or How can I best help you?     Questions on why he was started on lisinopril  2. SYMPTOMS : Do you have any symptoms?      none 3. OTHER QUESTIONS: Do you have any other questions?     Pt asked for more of an explanation as to why he was started on the medication and what exactly it would be doing. RN explained and answered all pts questions. Pt stated understanding and appreciation.  Protocols used: Information Only Call - No Triage-A-AH

## 2024-01-28 ENCOUNTER — Inpatient Hospital Stay

## 2024-02-02 ENCOUNTER — Other Ambulatory Visit: Payer: Self-pay | Admitting: Nurse Practitioner

## 2024-02-02 ENCOUNTER — Other Ambulatory Visit: Payer: Self-pay | Admitting: Cardiology

## 2024-02-02 DIAGNOSIS — R809 Proteinuria, unspecified: Secondary | ICD-10-CM

## 2024-02-05 NOTE — Telephone Encounter (Signed)
 Rx 01/08/24 #90 1RF- too soon Requested Prescriptions  Pending Prescriptions Disp Refills   lisinopril  (ZESTRIL ) 2.5 MG tablet [Pharmacy Med Name: Lisinopril  2.5 MG Oral Tablet] 100 tablet 2    Sig: TAKE 1 TABLET BY MOUTH DAILY     Cardiovascular:  ACE Inhibitors Failed - 02/05/2024  1:03 PM      Failed - Cr in normal range and within 180 days    Creat  Date Value Ref Range Status  07/06/2023 1.51 (H) 0.70 - 1.28 mg/dL Final   Creatinine, Ser  Date Value Ref Range Status  11/17/2023 1.65 (H) 0.61 - 1.24 mg/dL Final   Creatinine, Urine  Date Value Ref Range Status  01/06/2024 80 20 - 320 mg/dL Final         Passed - K in normal range and within 180 days    Potassium  Date Value Ref Range Status  11/17/2023 3.6 3.5 - 5.1 mmol/L Final         Passed - Patient is not pregnant      Passed - Last BP in normal range    BP Readings from Last 1 Encounters:  01/06/24 132/80         Passed - Valid encounter within last 6 months    Recent Outpatient Visits           1 month ago Aortic atherosclerosis   [facility] [name], FNP   1 month ago Wound cellulitis   [facility] [name], FNP   7 months ago Longstanding persistent atrial fibrillation [facility])   [facility] [name], FNP   8 months ago Essential hypertension, benign   [facility] [name], FNP       Future Appointments             In 3 weeks Gerard Frederick, NP [facility]

## 2024-03-01 ENCOUNTER — Ambulatory Visit: Attending: Cardiology | Admitting: Cardiology

## 2024-03-01 NOTE — Progress Notes (Unsigned)
 " Cardiology Office Note   Date:  03/01/2024  ID:  Garrett Yoder., DOB 1945-11-26, MRN [id number] PCP: [name], FNP  [facility] Providers Cardiologist:  Deatrice Cage, MD Electrophysiologist:  OLE ONEIDA HOLTS, MD (Inactive)  Electrophysiology APP:  Riddle, Suzann, NP { Click to update primary MD,subspecialty MD or APP then REFRESH:1}    History of Present Illness Garrett Yoder. is a 79 y.o. male with a past medical history of permanent atrial fibrillation, coronary calcifications, aortic atherosclerosis, hyperlipidemia, CKD stage III, type 2 diabetes, hypertension, who is here today for follow-up.   Establish care with Dr. Cage in December 2018 following imaging that showed aortic atherosclerosis.  Stress testing at that time showed low risk without ischemia or infarct with normal LVEF.  History of atrial fibrillation diagnosed in November 2023.  Echocardiogram in December 2023 revealed an LVEF of 50-55%, no RWMA, normal RV SF, mildly dilated right atrium, mild MR, mild TR.  Underwent cardioversion on February 27, 2022 with return of atrial fibrillation on January 16.  He saw Dr. Holts from EP in February 2024 and was asymptomatic from the A-fib perspective.  Rate controlled is agreed upon.  He was continued on Toprol  and digoxin  as well as apixaban  for stroke prophylaxis.  He was evaluated in the emergency department 04/26/2023 with hyperglycemia.  Blood sugars were in the 300s.  He was not lightheaded.  Denied any chest pain or shortness of breath.  He was noted to be in A-fib with RVR to be placed on a diltiazem  drip that was exacerbated by rhinovirus.  Diltiazem  drip was able to be titrated off on 04/27/2023.  Echocardiogram was repeated without any new changes.  He was able to be discharged home on 04/28/2023 with follow-up in A-fib clinic.  He was hospitalized at [facility] from 11/12/08/1/25 for acute metabolic encephalopathy, syncope with collapse, atrial fibrillation  with RVR, and uncontrolled type 2 diabetes with hypoglycemia.  He was found on the floor by his sister; unresponsive.  He recently had his insulin  dose decreased by his endocrinologist to his home on 11/10/2023.  He has previously been running hypoglycemic between 10 PM and 2 AM based on blood sugars uploaded from his Dexcom.  EKG revealed atrial fibrillation with a rate of 130.  Trauma imaging revealed a CT of the head and C-spine with left rib series and x-ray of the foot significant for age-indeterminate left fifth rib fracture and acute fractures of the 4th and 5th left metatarsals.  Today's atrial fibrillation with RVR he received one-time dose of IV diltiazem  in the ER and initial improvement in his heart rate with reoccurrence of RVR.  He was started on a diltiazem  drip and transition to oral diltiazem  was continued on metoprolol , digoxin  and apixaban  for primary stroke prophylaxis.  He was treated with 3 doses of IV furosemide  this was able to be weaned off of oxygen therapy.  He was considered stable for discharge and was discharged to [facility] on 11/18/2023.  He was last seen in clinic 11/30/2023 stable the cardiac perspective he had been doing well.  Denies chest pain, shortness of breath or palpitations.  He has continued on his current medication regimen without any changes at this time.  There was no further testing that was needed.   He returns to clinic today  ROS: 10 point review of system has been reviewed and considered negative the exception was been listed in the HPI  Studies Reviewed  2d echo 04/27/2023 1. Left ventricular ejection fraction, by estimation, is 55 to 60%. The  left ventricle has normal function. The left ventricle has no regional  wall motion abnormalities. Left ventricular diastolic parameters are  indeterminate.   2. Right ventricular systolic function is normal. The right ventricular  size is normal. There is normal pulmonary artery systolic pressure.    3. Left atrial size was mildly dilated.   4. The mitral valve is normal in structure. Mild mitral valve  regurgitation. No evidence of mitral stenosis.   5. The aortic valve is normal in structure. Aortic valve regurgitation is  not visualized. No aortic stenosis is present.   6. The inferior vena cava is dilated in size with >50% respiratory  variability, suggesting right atrial pressure of 8 mmHg.    Echo 2023 1. Left ventricular ejection fraction, by estimation, is 50 to 55%. The  left ventricle has low normal function. The left ventricle has no regional  wall motion abnormalities. Left ventricular diastolic parameters are  indeterminate.   2. Right ventricular systolic function is normal. The right ventricular  size is normal. There is normal pulmonary artery systolic pressure. The  estimated right ventricular systolic pressure is 34.2 mmHg.   3. Right atrial size was mildly dilated.   4. The mitral valve is normal in structure. Mild mitral valve  regurgitation. No evidence of mitral stenosis.   5. Tricuspid valve regurgitation is moderate.   6. The aortic valve is normal in structure. Aortic valve regurgitation is  not visualized. Aortic valve sclerosis/calcification is present, without  any evidence of aortic stenosis.   7. The inferior vena cava is normal in size with greater than 50%  respiratory variability, suggesting right atrial pressure of 3 mmHg.   8. Rhythm is atrial fibrillation rate 90 to 130 bpm    Myoview  lexiscan 2018 Narrative & Impression  Blood pressure demonstrated a hypertensive response to exercise. Horizontal ST segment depression ST segment depression of 1 mm was noted during stress in the II, III, aVF, V5 and V6 leads. The study is normal. This is a low risk study. The left ventricular ejection fraction is normal (55-65%). Poor exercise capacity overall. He was held in stage I.    Risk Assessment/Calculations  CHA2DS2-VASc Score =    {Confirm  score is correct.  If not, click here to update score.  REFRESH note.  :1} This indicates a  % annual risk of stroke. The patient's score is based upon:    {This patient has a significant risk of stroke if diagnosed with atrial fibrillation.  Please consider VKA or DOAC agent for anticoagulation if the bleeding risk is acceptable.   You can also use the SmartPhrase .[email or web address] for documentation.   :[id number]} No BP recorded.  {Refresh Note OR Click here to enter BP  :1}***       Physical Exam VS:  There were no vitals taken for this visit.       Wt Readings from Last 3 Encounters:  01/06/24 136 lb (61.7 kg)  12/10/23 132 lb 9.6 oz (60.1 kg)  11/30/23 133 lb (60.3 kg)    GEN: Well nourished, well developed in no acute distress NECK: No JVD; No carotid bruits CARDIAC: ***RRR, no murmurs, rubs, gallops RESPIRATORY:  Clear to auscultation without rales, wheezing or rhonchi  ABDOMEN: Soft, non-tender, non-distended EXTREMITIES:  No edema; No deformity   ASSESSMENT AND PLAN Persistent atrial fibrillation Primary hypertension Coronary artery calcifications on CT imaging Chronic kidney disease  stage IIIb Mixed hyperlipidemia Type 2 diabetes    {Are you ordering a CV Procedure (e.g. stress test, cath, DCCV, TEE, etc)?   Press F2        :[id number]}  Dispo: ***  Signed, Raliegh Scobie, NP   "

## 2024-03-02 ENCOUNTER — Ambulatory Visit: Payer: Self-pay | Admitting: Oncology

## 2024-03-02 ENCOUNTER — Inpatient Hospital Stay: Attending: Oncology

## 2024-03-02 DIAGNOSIS — D649 Anemia, unspecified: Secondary | ICD-10-CM | POA: Diagnosis present

## 2024-03-02 LAB — CBC (CANCER CENTER ONLY)
HCT: 36 % — ABNORMAL LOW (ref 39.0–52.0)
Hemoglobin: 11.6 g/dL — ABNORMAL LOW (ref 13.0–17.0)
MCH: 30.4 pg (ref 26.0–34.0)
MCHC: 32.2 g/dL (ref 30.0–36.0)
MCV: 94.2 fL (ref 80.0–100.0)
Platelet Count: 434 K/uL — ABNORMAL HIGH (ref 150–400)
RBC: 3.82 MIL/uL — ABNORMAL LOW (ref 4.22–5.81)
RDW: 13.3 % (ref 11.5–15.5)
WBC Count: 7.4 K/uL (ref 4.0–10.5)
nRBC: 0 % (ref 0.0–0.2)

## 2024-03-02 LAB — IRON AND TIBC
Iron: 61 ug/dL (ref 45–182)
Saturation Ratios: 14 % — ABNORMAL LOW (ref 17.9–39.5)
TIBC: 437 ug/dL (ref 250–450)
UIBC: 376 ug/dL

## 2024-03-02 LAB — FERRITIN: Ferritin: 30 ng/mL (ref 24–336)

## 2024-03-14 ENCOUNTER — Other Ambulatory Visit: Payer: Self-pay | Admitting: Nurse Practitioner

## 2024-03-15 NOTE — Telephone Encounter (Signed)
 Requested medications are due for refill today.  yes  Requested medications are on the active medications list.  yes  Last refill. 03/27/2023 #60 5 rf  Future visit scheduled.   yes  Notes to clinic.  Expired labs    Requested Prescriptions  Pending Prescriptions Disp Refills   tamsulosin  (FLOMAX ) 0.4 MG CAPS capsule [Pharmacy Med Name: Tamsulosin  HCl 0.4 MG Oral Capsule] 60 capsule 5    Sig: TAKE 1 CAPSULE BY MOUTH DAILY     Urology: Alpha-Adrenergic Blocker Failed - 03/15/2024 11:41 AM      Failed - PSA in normal range and within 360 days    Prostate Specific Ag, Serum  Date Value Ref Range Status  11/14/2016 2.6 0.0 - 4.0 ng/mL Final    Comment:    Roche ECLIA methodology. According to the American Urological Association, Serum PSA should decrease and remain at undetectable levels after radical prostatectomy. The AUA defines biochemical recurrence as an initial PSA value 0.2 ng/mL or greater followed by a subsequent confirmatory PSA value 0.2 ng/mL or greater. Values obtained with different assay methods or kits cannot be used interchangeably. Results cannot be interpreted as absolute evidence of the presence or absence of malignant disease.          Passed - Last BP in normal range    BP Readings from Last 1 Encounters:  01/06/24 132/80         Passed - Valid encounter within last 12 months    Recent Outpatient Visits           2 months ago Aortic atherosclerosis   [facility] [name], FNP   3 months ago Wound cellulitis   [facility] [name], FNP   8 months ago Longstanding persistent atrial fibrillation [facility])   [facility] [name], FNP   10 months ago Essential hypertension, benign   [facility] [name], [place]

## 2024-03-17 ENCOUNTER — Other Ambulatory Visit: Payer: Self-pay | Admitting: Nurse Practitioner

## 2024-03-17 DIAGNOSIS — R809 Proteinuria, unspecified: Secondary | ICD-10-CM

## 2024-03-17 NOTE — Telephone Encounter (Signed)
 Copied from CRM [id number]. Topic: General - Other >> Mar 17, 2024  2:57 PM Fonda T wrote: Reason for CRM: Pt calling requesting to speak to provider/nurse.  No further informations given.  Pt requesting a return call to discuss further, at [phone].

## 2024-03-17 NOTE — Telephone Encounter (Signed)
 Requested Prescriptions  Refused Prescriptions Disp Refills   lisinopril  (ZESTRIL ) 2.5 MG tablet [Pharmacy Med Name: Lisinopril  2.5 MG Oral Tablet] 100 tablet 2    Sig: TAKE 1 TABLET BY MOUTH DAILY     Cardiovascular:  ACE Inhibitors Failed - 03/17/2024  4:17 PM      Failed - Cr in normal range and within 180 days    Creat  Date Value Ref Range Status  07/06/2023 1.51 (H) 0.70 - 1.28 mg/dL Final   Creatinine, Ser  Date Value Ref Range Status  11/17/2023 1.65 (H) 0.61 - 1.24 mg/dL Final   Creatinine, Urine  Date Value Ref Range Status  01/06/2024 80 20 - 320 mg/dL Final         Passed - K in normal range and within 180 days    Potassium  Date Value Ref Range Status  11/17/2023 3.6 3.5 - 5.1 mmol/L Final         Passed - Patient is not pregnant      Passed - Last BP in normal range    BP Readings from Last 1 Encounters:  01/06/24 132/80         Passed - Valid encounter within last 6 months    Recent Outpatient Visits           2 months ago Aortic atherosclerosis   [facility] [name], FNP   3 months ago Wound cellulitis   [facility] [name], FNP   8 months ago Longstanding persistent atrial fibrillation [facility])   [facility] [name], FNP   10 months ago Essential hypertension, benign   [facility] [name], [place]

## 2024-03-18 ENCOUNTER — Ambulatory Visit: Admitting: Nurse Practitioner

## 2024-03-18 ENCOUNTER — Ambulatory Visit
Admission: RE | Admit: 2024-03-18 | Discharge: 2024-03-18 | Disposition: A | Source: Ambulatory Visit | Attending: Nurse Practitioner

## 2024-03-18 ENCOUNTER — Encounter: Payer: Self-pay | Admitting: Nurse Practitioner

## 2024-03-18 ENCOUNTER — Ambulatory Visit: Payer: Self-pay | Admitting: Nurse Practitioner

## 2024-03-18 VITALS — BP 132/78 | HR 97 | Temp 98.0°F | Ht 68.0 in | Wt 131.0 lb

## 2024-03-18 DIAGNOSIS — W000XXA Fall on same level due to ice and snow, initial encounter: Secondary | ICD-10-CM | POA: Diagnosis not present

## 2024-03-18 DIAGNOSIS — W19XXXA Unspecified fall, initial encounter: Secondary | ICD-10-CM

## 2024-03-18 DIAGNOSIS — S0990XA Unspecified injury of head, initial encounter: Secondary | ICD-10-CM | POA: Insufficient documentation

## 2024-03-18 DIAGNOSIS — Z7901 Long term (current) use of anticoagulants: Secondary | ICD-10-CM | POA: Insufficient documentation

## 2024-03-18 DIAGNOSIS — R9082 White matter disease, unspecified: Secondary | ICD-10-CM | POA: Insufficient documentation

## 2024-03-18 DIAGNOSIS — I672 Cerebral atherosclerosis: Secondary | ICD-10-CM | POA: Diagnosis not present

## 2024-03-18 NOTE — Progress Notes (Addendum)
 "  BP 132/78   Pulse 97   Temp 98 F (36.7 C)   Ht 5' 8 (1.727 m)   Wt 131 lb (59.4 kg)   SpO2 99%   BMI 19.92 kg/m    Subjective:    Patient ID: Garrett Yoder., male    DOB: 1945/11/03, 79 y.o.   MRN: [id number]  HPI: Garrett Yoder. is a 79 y.o. male  Comes in for complaint of slipped on ice yesterday while walking to the mailbox.  Chief Complaint  Patient presents with   Fall    Pt states he slipped on ice yesterday hitting his head. No LOC or headache.    Fall/head injury/blood thinners:  patient reports slipping on ice yesterday and hitting his head on the ground. He denies any LOC, pain, dizziness, nausea or vomiting.  Patient neuro exam is in normal limits.  Discussed risk since he is on eliquis  5 mg BID for afib. Will get stat head ct.       01/06/2024    1:26 PM 12/10/2023   12:51 PM 10/15/2023   12:49 PM  Depression screen PHQ 2/9  Decreased Interest 0 0 0  Down, Depressed, Hopeless 0 0 1  PHQ - 2 Score 0 0 1  Altered sleeping 0 0 0  Tired, decreased energy 0 0 0  Change in appetite 0 0 0  Feeling bad or failure about yourself  0 0 0  Trouble concentrating 0 0 0  Moving slowly or fidgety/restless 0 0 0  Suicidal thoughts 0 0 0  PHQ-9 Score 0 0  1   Difficult doing work/chores  Not difficult at all Not difficult at all     Data saved with a previous flowsheet row definition    Relevant past medical, surgical, family and social history reviewed and updated as indicated. Interim medical history since our last visit reviewed. Allergies and medications reviewed and updated.  Review of Systems  Ten systems reviewed and is negative except as mentioned in HPI      Objective:     BP 132/78   Pulse 97   Temp 98 F (36.7 C)   Ht 5' 8 (1.727 m)   Wt 131 lb (59.4 kg)   SpO2 99%   BMI 19.92 kg/m    Wt Readings from Last 3 Encounters:  03/18/24 131 lb (59.4 kg)  01/06/24 136 lb (61.7 kg)  12/10/23 132 lb 9.6 oz (60.1 kg)    Physical  Exam Vitals reviewed.  Constitutional:      Appearance: Normal appearance.  HENT:     Head: Normocephalic.  Cardiovascular:     Rate and Rhythm: Normal rate.  Pulmonary:     Effort: Pulmonary effort is normal.  Neurological:     General: No focal deficit present.     Mental Status: He is alert and oriented to person, place, and time. Mental status is at baseline.     Sensory: No sensory deficit.     Motor: No weakness.     Coordination: Coordination normal.     Gait: Gait normal.  Psychiatric:        Mood and Affect: Mood normal.        Behavior: Behavior normal.        Thought Content: Thought content normal.        Judgment: Judgment normal.     Results for orders placed or performed in visit on 03/02/24  Iron and TIBC   Collection  Time: 03/02/24  9:58 AM  Result Value Ref Range   Iron 61 45 - 182 ug/dL   TIBC 562 749 - 549 ug/dL   Saturation Ratios 14 (L) 17.9 - 39.5 %   UIBC 376 ug/dL  Ferritin   Collection Time: 03/02/24  9:58 AM  Result Value Ref Range   Ferritin 30 24 - 336 ng/mL  CBC (Cancer Center Only)   Collection Time: 03/02/24  9:58 AM  Result Value Ref Range   WBC Count 7.4 4.0 - 10.5 K/uL   RBC 3.82 (L) 4.22 - 5.81 MIL/uL   Hemoglobin 11.6 (L) 13.0 - 17.0 g/dL   HCT 63.9 (L) 60.9 - 47.9 %   MCV 94.2 80.0 - 100.0 fL   MCH 30.4 26.0 - 34.0 pg   MCHC 32.2 30.0 - 36.0 g/dL   RDW 86.6 88.4 - 84.4 %   Platelet Count 434 (H) 150 - 400 K/uL   nRBC 0.0 0.0 - 0.2 %          Assessment & Plan:   Problem List Items Addressed This Visit   None Visit Diagnoses       Fall, initial encounter    -  Primary   Relevant Orders   CT HEAD WO CONTRAST ( )     Traumatic injury of head, initial encounter       Relevant Orders   CT HEAD WO CONTRAST ( )     Hx of long term use of blood thinners       Relevant Orders   CT HEAD WO CONTRAST ( )            Follow up plan: Return if symptoms worsen or fail to improve.  I have reviewed this  encounter including the documentation in this note and/or discussed this patient with the provider, Diamone Mays, SNP, I am certifying that I agree with the content of this note as supervising/preceptor nurse practitioner.  Mliss Spray, FNP-C [facility] 03/18/2024, 10:23 AM     "

## 2024-03-21 ENCOUNTER — Other Ambulatory Visit: Payer: Self-pay | Admitting: Nurse Practitioner

## 2024-03-21 ENCOUNTER — Other Ambulatory Visit: Payer: Self-pay | Admitting: Cardiology

## 2024-03-21 DIAGNOSIS — R809 Proteinuria, unspecified: Secondary | ICD-10-CM

## 2024-07-06 ENCOUNTER — Ambulatory Visit: Admitting: Nurse Practitioner

## 2024-07-29 ENCOUNTER — Ambulatory Visit: Admitting: Oncology

## 2024-07-29 ENCOUNTER — Other Ambulatory Visit

## 2024-10-20 ENCOUNTER — Ambulatory Visit
